# Patient Record
Sex: Male | Born: 1940
Health system: Southern US, Community
[De-identification: ages and names within clinical notes are randomized; demographics above are authoritative.]

## PROBLEM LIST (undated history)

## (undated) DIAGNOSIS — I10 Essential (primary) hypertension: Secondary | ICD-10-CM

## (undated) DIAGNOSIS — D126 Benign neoplasm of colon, unspecified: Secondary | ICD-10-CM

## (undated) DIAGNOSIS — N419 Inflammatory disease of prostate, unspecified: Secondary | ICD-10-CM

## (undated) DIAGNOSIS — M722 Plantar fascial fibromatosis: Secondary | ICD-10-CM

## (undated) DIAGNOSIS — K648 Other hemorrhoids: Secondary | ICD-10-CM

## (undated) DIAGNOSIS — E785 Hyperlipidemia, unspecified: Secondary | ICD-10-CM

## (undated) DIAGNOSIS — I219 Acute myocardial infarction, unspecified: Secondary | ICD-10-CM

## (undated) DIAGNOSIS — S62609A Fracture of unspecified phalanx of unspecified finger, initial encounter for closed fracture: Secondary | ICD-10-CM

## (undated) DIAGNOSIS — Z9889 Other specified postprocedural states: Secondary | ICD-10-CM

## (undated) DIAGNOSIS — S2239XA Fracture of one rib, unspecified side, initial encounter for closed fracture: Secondary | ICD-10-CM

## (undated) HISTORY — PX: POLYPECTOMY: SHX149

## (undated) HISTORY — DX: Other specified postprocedural states: Z98.890

## (undated) HISTORY — DX: Fracture of unspecified phalanx of unspecified finger, initial encounter for closed fracture: S62.609A

## (undated) HISTORY — PX: VARICOCELECTOMY: SHX1084

## (undated) HISTORY — DX: Plantar fascial fibromatosis: M72.2

## (undated) HISTORY — PX: COLONOSCOPY: SHX174

## (undated) HISTORY — DX: Hyperlipidemia, unspecified: E78.5

## (undated) HISTORY — DX: Essential (primary) hypertension: I10

## (undated) HISTORY — DX: Other hemorrhoids: K64.8

## (undated) HISTORY — DX: Acute myocardial infarction, unspecified: I21.9

## (undated) HISTORY — DX: Inflammatory disease of prostate, unspecified: N41.9

## (undated) HISTORY — DX: Fracture of one rib, unspecified side, initial encounter for closed fracture: S22.39XA

## (undated) HISTORY — DX: Benign neoplasm of colon, unspecified: D12.6

## (undated) HISTORY — PX: TONSILLECTOMY: SUR1361

---

## 2004-06-13 ENCOUNTER — Ambulatory Visit: Payer: Self-pay | Admitting: Internal Medicine

## 2004-06-16 ENCOUNTER — Ambulatory Visit: Payer: Self-pay | Admitting: Internal Medicine

## 2004-07-12 ENCOUNTER — Ambulatory Visit: Payer: Self-pay | Admitting: Gastroenterology

## 2004-07-22 ENCOUNTER — Ambulatory Visit: Payer: Self-pay | Admitting: Gastroenterology

## 2005-10-16 ENCOUNTER — Ambulatory Visit: Payer: Self-pay | Admitting: Internal Medicine

## 2005-10-23 ENCOUNTER — Ambulatory Visit: Payer: Self-pay | Admitting: Internal Medicine

## 2007-12-23 ENCOUNTER — Encounter: Payer: Self-pay | Admitting: *Deleted

## 2007-12-23 DIAGNOSIS — Z9089 Acquired absence of other organs: Secondary | ICD-10-CM | POA: Insufficient documentation

## 2007-12-23 DIAGNOSIS — M722 Plantar fascial fibromatosis: Secondary | ICD-10-CM

## 2007-12-23 DIAGNOSIS — K648 Other hemorrhoids: Secondary | ICD-10-CM

## 2007-12-23 DIAGNOSIS — D126 Benign neoplasm of colon, unspecified: Secondary | ICD-10-CM

## 2007-12-23 DIAGNOSIS — Z87448 Personal history of other diseases of urinary system: Secondary | ICD-10-CM

## 2007-12-23 DIAGNOSIS — Z9189 Other specified personal risk factors, not elsewhere classified: Secondary | ICD-10-CM | POA: Insufficient documentation

## 2007-12-23 DIAGNOSIS — S2239XA Fracture of one rib, unspecified side, initial encounter for closed fracture: Secondary | ICD-10-CM | POA: Insufficient documentation

## 2007-12-23 DIAGNOSIS — S62609A Fracture of unspecified phalanx of unspecified finger, initial encounter for closed fracture: Secondary | ICD-10-CM | POA: Insufficient documentation

## 2008-04-17 ENCOUNTER — Telehealth: Payer: Self-pay | Admitting: Internal Medicine

## 2008-04-20 ENCOUNTER — Ambulatory Visit: Payer: Self-pay | Admitting: Internal Medicine

## 2008-04-20 DIAGNOSIS — E782 Mixed hyperlipidemia: Secondary | ICD-10-CM | POA: Insufficient documentation

## 2008-04-20 DIAGNOSIS — Z85828 Personal history of other malignant neoplasm of skin: Secondary | ICD-10-CM | POA: Insufficient documentation

## 2008-04-20 DIAGNOSIS — R109 Unspecified abdominal pain: Secondary | ICD-10-CM

## 2008-04-20 LAB — CONVERTED CEMR LAB
AST: 18 units/L (ref 0–37)
Alkaline Phosphatase: 49 units/L (ref 39–117)
Basophils Absolute: 0.1 10*3/uL (ref 0.0–0.1)
Basophils Relative: 0.8 % (ref 0.0–3.0)
Bilirubin, Direct: 0.1 mg/dL (ref 0.0–0.3)
Cholesterol: 183 mg/dL (ref 0–200)
Eosinophils Absolute: 0.1 10*3/uL (ref 0.0–0.7)
HDL: 43.5 mg/dL (ref >39.0)
LDL Cholesterol: 123 mg/dL — ABNORMAL HIGH (ref 0–99)
Lymphocytes Relative: 32.4 % (ref 12.0–46.0)
MCHC: 35.3 g/dL (ref 30.0–36.0)
MCV: 90.7 fL (ref 78.0–100.0)
Neutrophils Relative %: 54 % (ref 43.0–77.0)
Platelets: 252 10*3/uL (ref 150–400)
RBC: 4.69 M/uL (ref 4.22–5.81)
Total Bilirubin: 1.2 mg/dL (ref 0.3–1.2)
VLDL: 17 mg/dL (ref 0–40)

## 2008-07-17 ENCOUNTER — Encounter: Payer: Self-pay | Admitting: Internal Medicine

## 2008-07-27 ENCOUNTER — Encounter: Payer: Self-pay | Admitting: Internal Medicine

## 2008-08-17 ENCOUNTER — Telehealth: Payer: Self-pay | Admitting: Internal Medicine

## 2008-10-16 ENCOUNTER — Telehealth: Payer: Self-pay | Admitting: Internal Medicine

## 2008-10-20 ENCOUNTER — Ambulatory Visit: Payer: Self-pay | Admitting: Internal Medicine

## 2008-10-20 DIAGNOSIS — G609 Hereditary and idiopathic neuropathy, unspecified: Secondary | ICD-10-CM | POA: Insufficient documentation

## 2008-11-23 ENCOUNTER — Telehealth: Payer: Self-pay | Admitting: Internal Medicine

## 2008-11-24 ENCOUNTER — Ambulatory Visit: Payer: Self-pay | Admitting: Internal Medicine

## 2008-11-24 DIAGNOSIS — R609 Edema, unspecified: Secondary | ICD-10-CM

## 2008-11-24 DIAGNOSIS — M79609 Pain in unspecified limb: Secondary | ICD-10-CM

## 2008-11-24 IMAGING — CR DG HAND COMPLETE 3+V*R*
3 series · 3 of 3 positions shown · non-contrast
Comparison: None available.

CLINICAL DATA: Right hand pain.  Swelling about the third MCP
joint.

RIGHT HAND - COMPLETE 3+ VIEW

[view not recorded (1 of 3)]
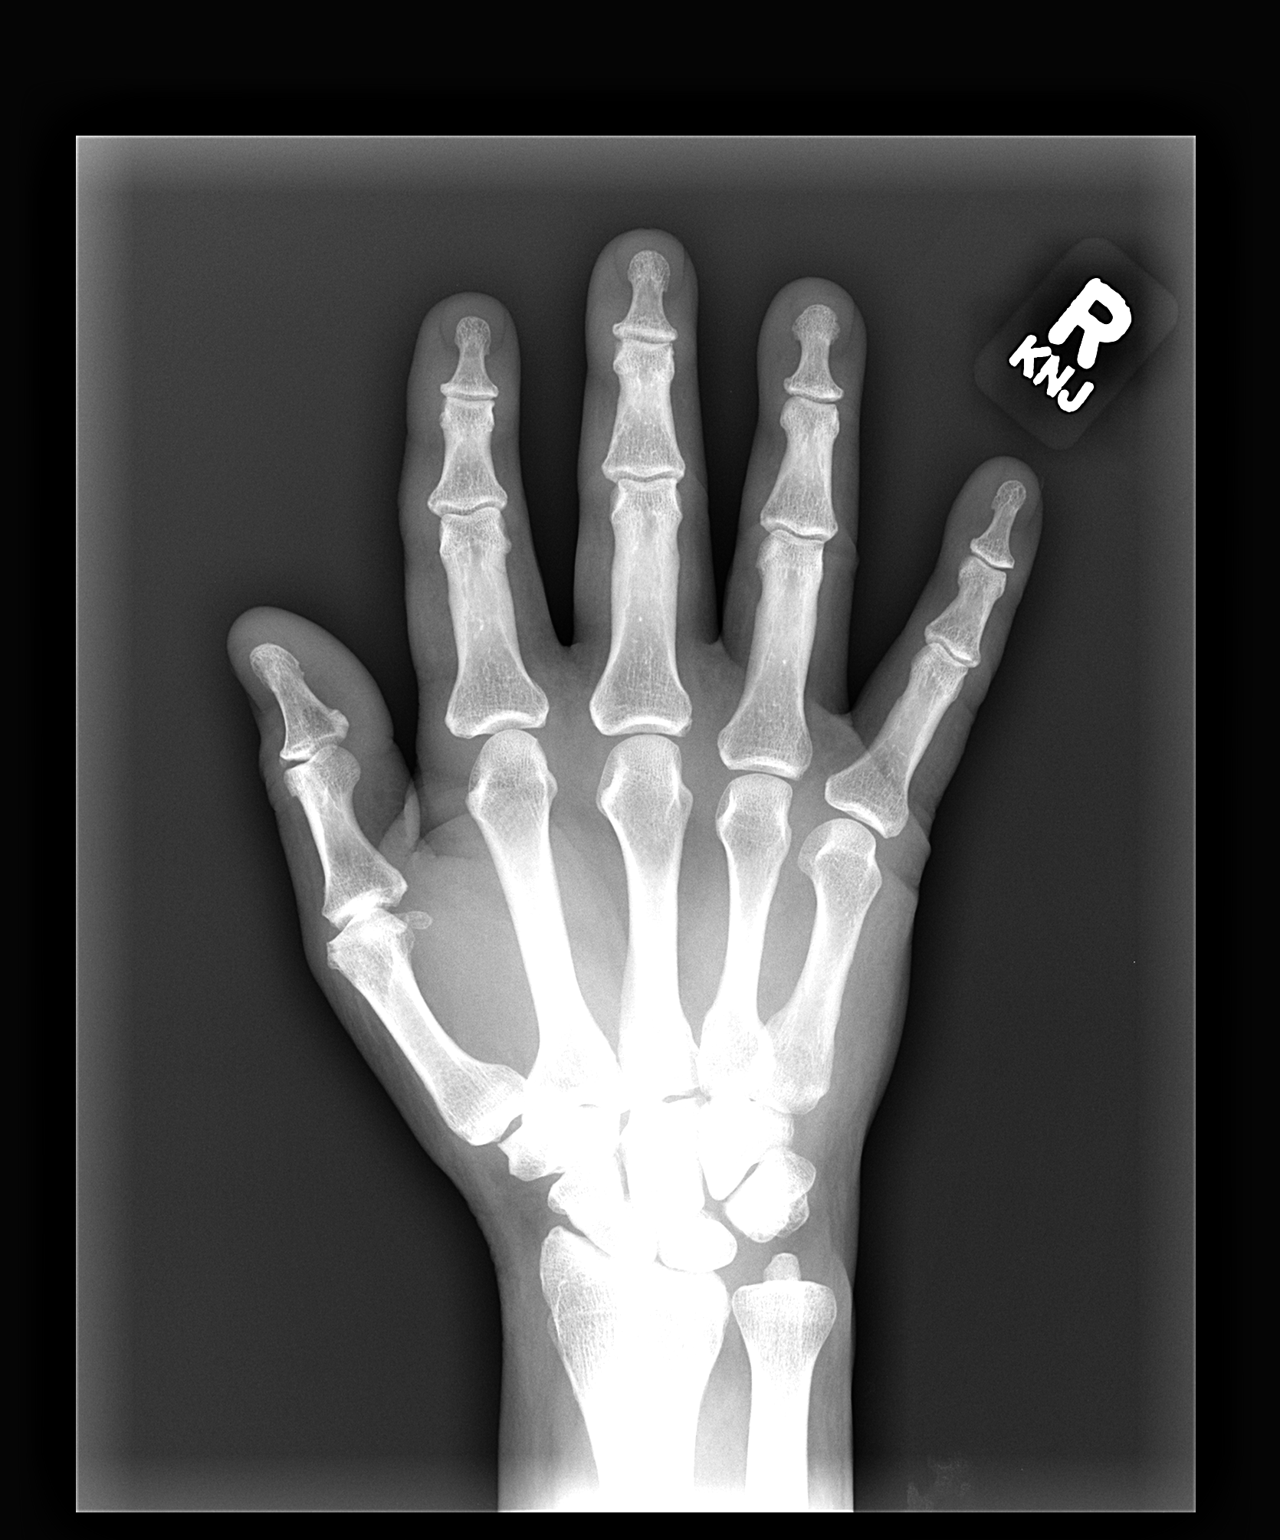

[view not recorded (2 of 3)]
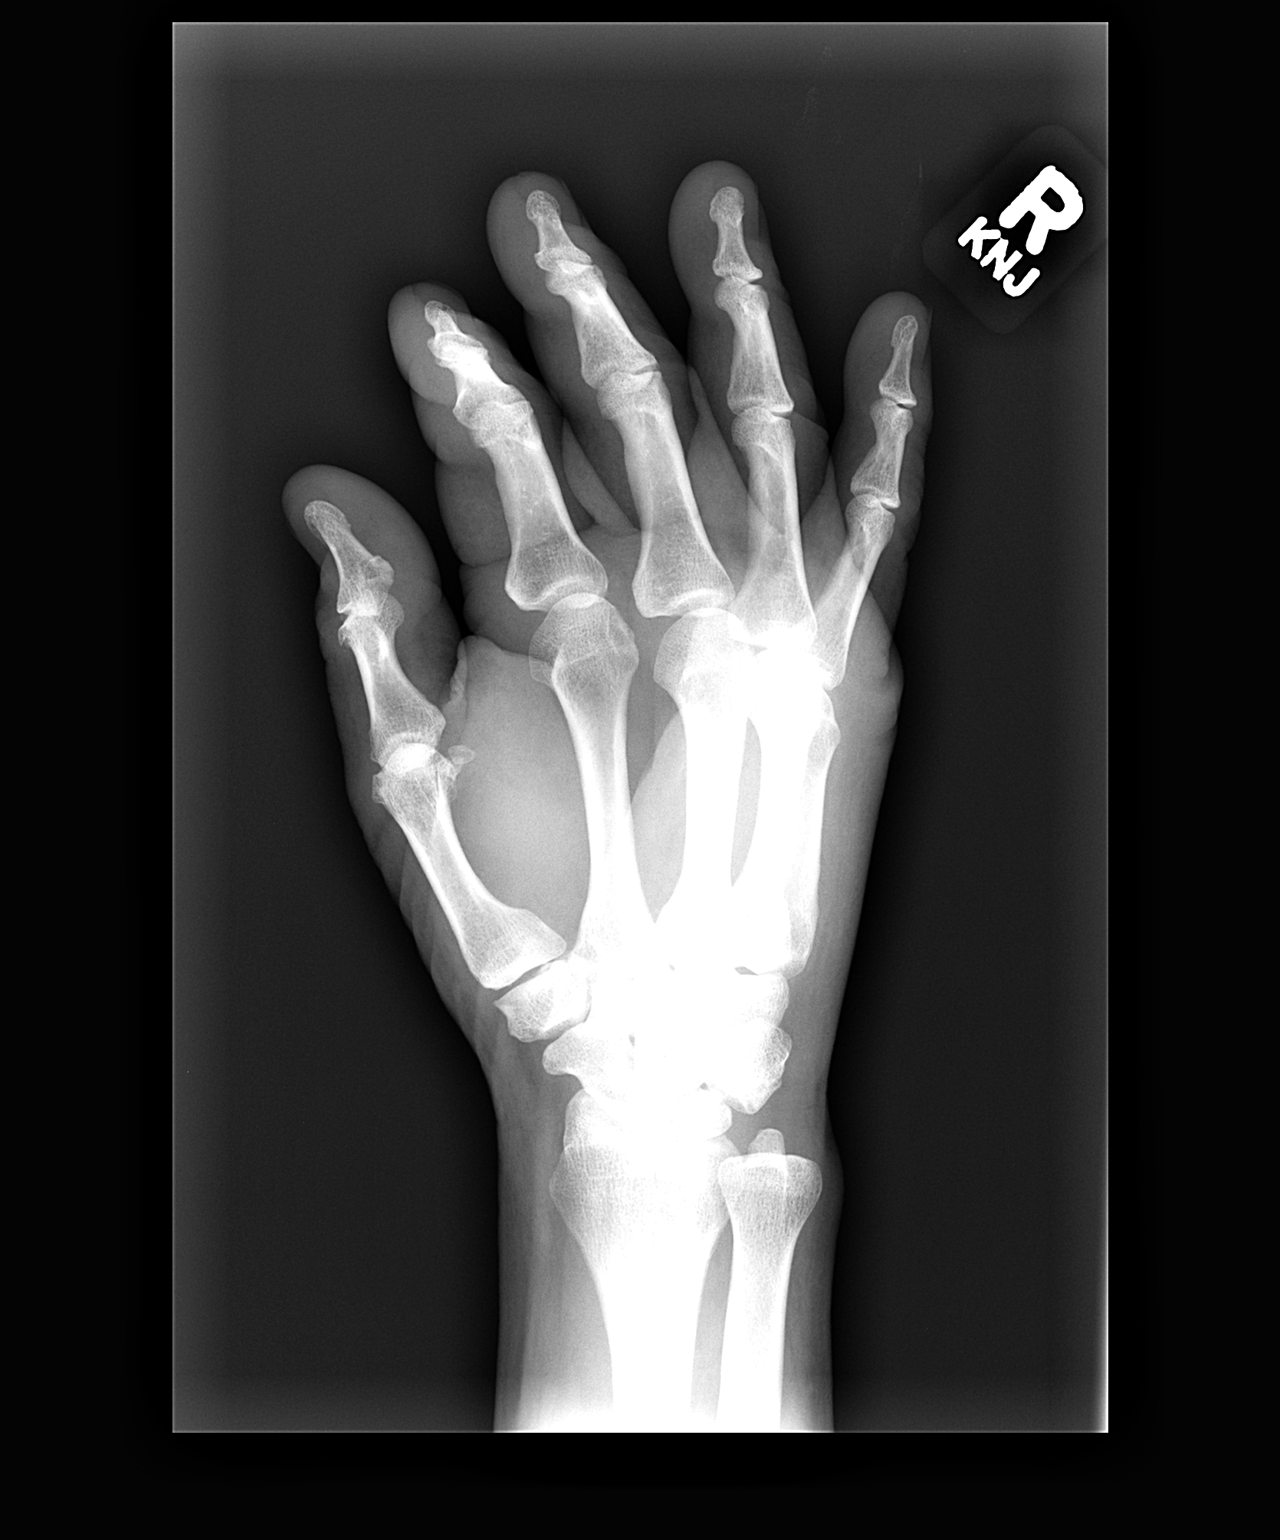

[view not recorded (3 of 3)]
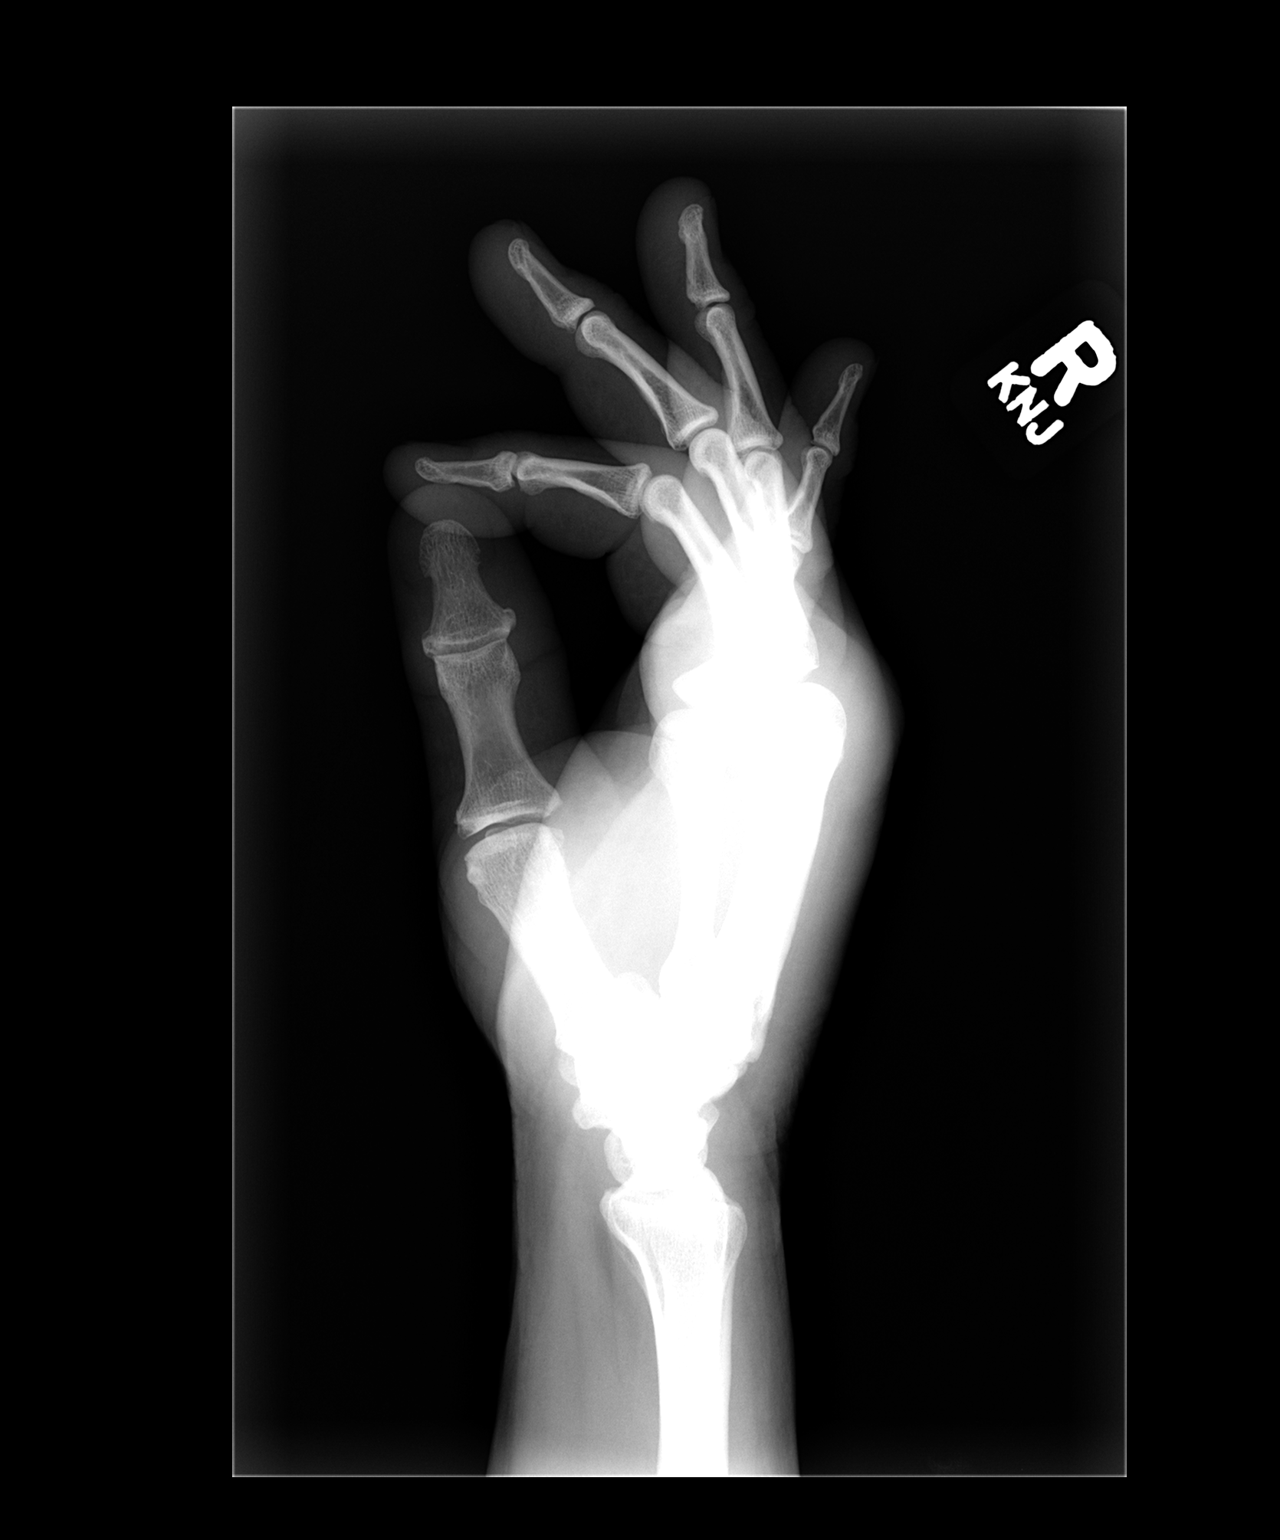

[3 of 3 positions shown; findings below may reference images not displayed]

FINDINGS: There is no fracture or dislocation.  Joint spaces and
alignment are maintained.  No obvious soft tissue swelling.
IMPRESSION: Negative exam.

## 2008-11-25 LAB — CONVERTED CEMR LAB
BUN: 15 mg/dL (ref 6–23)
CO2: 26 meq/L (ref 19–32)
Chloride: 108 meq/L (ref 96–112)
Creatinine, Ser: 0.8 mg/dL (ref 0.4–1.5)
Eosinophils Absolute: 0.1 10*3/uL (ref 0.0–0.7)
Eosinophils Relative: 1.6 % (ref 0.0–5.0)
Glucose, Bld: 102 mg/dL — ABNORMAL HIGH (ref 70–99)
HCT: 43.8 % (ref 39.0–52.0)
Lymphs Abs: 2.1 10*3/uL (ref 0.7–4.0)
MCHC: 34.4 g/dL (ref 30.0–36.0)
MCV: 92.7 fL (ref 78.0–100.0)
Monocytes Absolute: 0.7 10*3/uL (ref 0.1–1.0)
Neutrophils Relative %: 59.3 % (ref 43.0–77.0)
Platelets: 275 10*3/uL (ref 150.0–400.0)
Potassium: 4.1 meq/L (ref 3.5–5.1)
Uric Acid, Serum: 7 mg/dL (ref 4.0–7.8)
WBC: 7.3 10*3/uL (ref 4.5–10.5)

## 2008-12-04 ENCOUNTER — Telehealth (INDEPENDENT_AMBULATORY_CARE_PROVIDER_SITE_OTHER): Payer: Self-pay | Admitting: *Deleted

## 2008-12-07 ENCOUNTER — Ambulatory Visit: Payer: Self-pay | Admitting: Internal Medicine

## 2008-12-10 ENCOUNTER — Telehealth: Payer: Self-pay | Admitting: Internal Medicine

## 2008-12-15 ENCOUNTER — Encounter: Payer: Self-pay | Admitting: Internal Medicine

## 2009-06-11 ENCOUNTER — Encounter (INDEPENDENT_AMBULATORY_CARE_PROVIDER_SITE_OTHER): Payer: Self-pay | Admitting: *Deleted

## 2009-07-13 DIAGNOSIS — C4492 Squamous cell carcinoma of skin, unspecified: Secondary | ICD-10-CM

## 2009-07-13 HISTORY — DX: Squamous cell carcinoma of skin, unspecified: C44.92

## 2009-08-25 ENCOUNTER — Encounter (INDEPENDENT_AMBULATORY_CARE_PROVIDER_SITE_OTHER): Payer: Self-pay | Admitting: *Deleted

## 2009-09-10 ENCOUNTER — Encounter (INDEPENDENT_AMBULATORY_CARE_PROVIDER_SITE_OTHER): Payer: Self-pay | Admitting: *Deleted

## 2009-09-13 ENCOUNTER — Ambulatory Visit: Payer: Self-pay | Admitting: Gastroenterology

## 2009-09-27 ENCOUNTER — Ambulatory Visit: Payer: Self-pay | Admitting: Gastroenterology

## 2009-09-27 LAB — HM COLONOSCOPY

## 2009-09-29 ENCOUNTER — Encounter: Payer: Self-pay | Admitting: Gastroenterology

## 2009-09-30 ENCOUNTER — Ambulatory Visit: Payer: Self-pay | Admitting: Internal Medicine

## 2009-09-30 LAB — CONVERTED CEMR LAB
AST: 20 units/L (ref 0–37)
Alkaline Phosphatase: 59 units/L (ref 39–117)
BUN: 14 mg/dL (ref 6–23)
Basophils Absolute: 0.1 10*3/uL (ref 0.0–0.1)
Bilirubin, Direct: 0.2 mg/dL (ref 0.0–0.3)
CO2: 29 meq/L (ref 19–32)
Calcium: 9.1 mg/dL (ref 8.4–10.5)
Chloride: 104 meq/L (ref 96–112)
Cholesterol: 214 mg/dL — ABNORMAL HIGH (ref 0–200)
Creatinine, Ser: 0.9 mg/dL (ref 0.4–1.5)
Direct LDL: 139.4 mg/dL
Eosinophils Absolute: 0.1 10*3/uL (ref 0.0–0.7)
HCT: 44.1 % (ref 39.0–52.0)
Lymphs Abs: 2.3 10*3/uL (ref 0.7–4.0)
MCV: 94 fL (ref 78.0–100.0)
Monocytes Absolute: 0.6 10*3/uL (ref 0.1–1.0)
Neutrophils Relative %: 47.4 % (ref 43.0–77.0)
Platelets: 240 10*3/uL (ref 150.0–400.0)
RDW: 12.3 % (ref 11.5–14.6)
Total CHOL/HDL Ratio: 4
Total Protein: 7.5 g/dL (ref 6.0–8.3)
VLDL: 18 mg/dL (ref 0.0–40.0)

## 2010-09-06 NOTE — Letter (Signed)
Summary: Patient Notice- Polyp Results  [facility]  [address] [place], [place] [address]   Phone: [phone]  Fax: [phone]        September 29, 2009 MRN: [id number]    Billy [name] [address] [place], [place]  [address]    Dear Billy [name],  I am pleased to inform you that the colon polyp(s) removed during your recent colonoscopy was (were) found to be benign (no cancer detected) upon pathologic examination.  I recommend you have a repeat colonoscopy examination in 5 years to look for recurrent polyps, as having colon polyps increases your risk for having recurrent polyps or even colon cancer in the future.  Should you develop new or worsening symptoms of abdominal pain, bowel habit changes or bleeding from the rectum or bowels, please schedule an evaluation with either your primary care physician or with me.  Continue treatment plan as outlined the day of your exam.  Please call us if you are having persistent problems or have questions about your condition that have not been fully answered at this time.  Sincerely,  Meryl Dare MD [facility]  This letter has been electronically signed by your physician.  Appended Document: Patient Notice- Polyp Results  Letter mailed 2.24.11

## 2010-09-06 NOTE — Letter (Signed)
Summary: Previsit letter  [facility]  [address] [place], [place] [address]   Phone: [phone]  Fax: [phone]       08/25/2009 MRN: [id number]  Billy [name] [address] [place], [place]  [address]  Dear Billy [name],  Welcome to the Gastroenterology Division at [facility].    You are scheduled to see a nurse for your pre-procedure visit on 09/13/2009 at 10:30am on the 3rd floor at [facility], [address]  We ask that you try to arrive at our office 15 minutes prior to your appointment time to allow for check-in.  Your nurse visit will consist of discussing your medical and surgical history, your immediate family medical history, and your medications.    Please bring a complete list of all your medications or, if you prefer, bring the medication bottles and we will list them.  We will need to be aware of both prescribed and over the counter drugs.  We will need to know exact dosage information as well.  If you are on blood thinners (Coumadin, Plavix, Aggrenox, Ticlid, etc.) please call our office today/prior to your appointment, as we need to consult with your physician about holding your medication.   Please be prepared to read and sign documents such as consent forms, a financial agreement, and acknowledgement forms.  If necessary, and with your consent, a friend or relative is welcome to sit-in on the nurse visit with you.  Please bring your insurance card so that we may make a copy of it.  If your insurance requires a referral to see a specialist, please bring your referral form from your primary care physician.  No co-pay is required for this nurse visit.     If you cannot keep your appointment, please call [phone] to cancel or reschedule prior to your appointment date.  This allows [place] the opportunity to schedule an appointment for another patient in need of care.    Thank you for choosing [facility] for your medical  needs.  We appreciate the opportunity to care for you.  Please visit [place] at our website  to learn more about our practice.                     Sincerely.                                                                                                                   The Gastroenterology Division

## 2010-09-06 NOTE — Assessment & Plan Note (Signed)
Summary: YEARLY--STC   Vital Signs:  Patient profile:   70 year old male Height:      73 inches Weight:      199 pounds BMI:     26.35 O2 Sat:      97 % on Room air Temp:     97.6 degrees F oral Pulse rate:   60 / minute BP sitting:   136 / 86  (left arm) Cuff size:   large  Vitals Entered By: Brenton Grills (September 30, 2009 10:13 AM)  O2 Flow:  Room air CC: Pt here for physical./aj   Primary Care Provider:  Norins  CC:  Pt here for physical./aj.  History of Present Illness: Patient presents for a routine medical exam. He had colonsocopy 2/21 - tubular adenoma. He monitors BP  at home- generally 120's/80's. He does report a sense of fullness or tightness of his pants at times. He reports that his swollen left hand resolved without incident. He has a sore shoulder from taking a fall skiing.     Current Medications (verified): 1)  Multivitamins   Tabs (Multiple Vitamin) .... Take Once Daily 2)  Fish Oil   Oil (Fish Oil) .... Take 2 Tabs Once Daily 3)  Red Yeast Rice 600 Mg  Caps (Red Yeast Rice Extract) .... Take Two Tablets Daily 4)  Hydrocodone-Acetaminophen 5-325 Mg Tabs (Hydrocodone-Acetaminophen) .Marland Kitchen.. 1 By Mouth Up To 4 Times Per Day As Needed For Pain 5)  Colchicine 0.6 Mg Tabs (Colchicine) .... 2 Tablets Once A Day 6)  Moviprep 100 Gm  Solr (Peg-Kcl-Nacl-Nasulf-Na Asc-C) .... As Per Prep Instructions.  Allergies (verified): 1)  ! 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: Married - '69 1 son - '78, 1 daughter '74  2 grandchildren reitired - stays busy, does some real-estate speculation  Review of Systems  The patient denies anorexia, fever, weight loss, weight gain, vision loss, decreased hearing, hoarseness, chest pain, syncope, dyspnea on exertion, peripheral edema, prolonged cough, headaches, hemoptysis, abdominal pain, melena, hematochezia, severe indigestion/heartburn, hematuria, incontinence, genital sores, muscle weakness, suspicious skin lesions, transient blindness, difficulty walking, depression, unusual weight change, enlarged lymph nodes, angioedema, and testicular masses.    Physical Exam  General:  WNWD white male who looks younger than his stated age Head:  Normocephalic and atraumatic without obvious abnormalities. No apparent alopecia or balding. Eyes:  No corneal or conjunctival inflammation noted. EOMI. Perrla. Funduscopic exam benign, without hemorrhages, exudates or papilledema. Vision grossly normal. Ears:  External ear exam shows no significant lesions or deformities.  Otoscopic examination reveals clear canals, tympanic membranes are intact bilaterally without bulging, retraction, inflammation or discharge. Hearing is grossly normal bilaterally. Nose:  no external deformity and no external erythema.   Mouth:  Oral mucosa and oropharynx without lesions or exudates.  Teeth in good repair. Neck:  No deformities, masses, or tenderness noted. Chest Wall:  No deformities, masses, tenderness or  gynecomastia noted. Lungs:  Normal respiratory effort, chest expands symmetrically. Lungs are clear to auscultation, no crackles or wheezes. Heart:  Normal rate and regular rhythm. S1 and S2 normal without gallop, murmur, click, rub or other extra sounds. Abdomen:  soft, non-tender, normal  bowel sounds, no guarding, no rigidity, and no hepatomegaly.   Rectal:  No external abnormalities noted. Normal sphincter tone. No rectal masses or tenderness. Prostate:  Prostate gland firm and smooth, no enlargement, nodularity, tenderness, mass, asymmetry or induration. Msk:  normal ROM, no joint tenderness, no joint swelling, no joint warmth, no joint deformities, and no joint instability.   Pulses:  2+ radial and DP pulses Extremities:  No clubbing, cyanosis, edema, or deformity noted with normal full range of motion of all joints.   Neurologic:  No cranial nerve deficits noted. Station and gait are normal. Plantar reflexes are down-going bilaterally. DTRs are symmetrical throughout. Sensory, motor and coordinative functions appear intact. Skin:  turgor normal, color normal, no rashes, no ulcerations, and no edema.   Cervical Nodes:  no anterior cervical adenopathy and no posterior cervical adenopathy.   Inguinal Nodes:  no R inguinal adenopathy and no L inguinal adenopathy.   Psych:  Oriented X3, memory intact for recent and remote, normally interactive, good eye contact, and not anxious appearing.     Impression & Recommendations:  Problem # 1:  HYPERLIPIDEMIA (ICD-272.4) Takes red yeast rice. Due for lab follow-up  Orders: TLB-Lipid Panel (80061-LIPID) TLB-Hepatic/Liver Function Pnl (80076-HEPATIC)  Addendum: excellent HDL, LDL @ 139.4 just above goal of 130 or less; LDL/HDL ratio 2.36 is lower than average risk. 10 year risk of cardiac event = 12% (see attachments)  Plan - continue present regimen  Problem # 2:  ELEVATED BLOOD PRESSURE WITHOUT DIAGNOSIS OF HYPERTENSION (ICD-796.2) Patient had BP of 160/100 both arms on my exam. He reports no prior h/o high blood pressure and he has readings at home that are never higher than 130/80s.  Plan - patient to check BP at random times and report back           will initiate treatment if he has high readings at home.  Problem # 3:   POLYPECTOMY, HX OF (ICD-V15.9) Just had polypectomy - tubular adenoma. He is for recall in 5 years.  Will check CBC  Orders: TLB-CBC Platelet - w/Differential (85025-CBCD)  Problem # 4:  Preventive Health Care (ICD-V70.0) Unremarkable history except for skiing accident leaving him with sore trapezius right. Normal exam. Lab results are good. Current with colorectal cancer screening. PSA is normal. Last tetnus in 2000. He is due for tetnus booster, pneumonia vaccine, shingles vaccine.  In summary - a very pleasant gentleman who appears healthy and medically stable with the exception of elevated BP at today's visit. BP will be monitored.  Complete Medication List: 1)  Multivitamins Tabs (Multiple vitamin) .... Take once daily 2)  Fish Oil Oil (Fish oil) .... Take 2 tabs once daily 3)  Red Yeast Rice 600 Mg Caps (Red yeast rice extract) .... Take two tablets daily 4)  Hydrocodone-acetaminophen 5-325 Mg Tabs (Hydrocodone-acetaminophen) .Marland Kitchen.. 1 by mouth up to 4 times per day as needed for pain 5)  Colchicine 0.6 Mg Tabs (Colchicine) .... 2 tablets once a day  Other Orders: TLB-BMP (Basic Metabolic Panel-BMET) (80048-METABOL) TLB-PSA (Prostate Specific Antigen) (84153-PSA)   Patient: Billy [name] Note: All result statuses are Final unless otherwise noted.  Tests: (1) Lipid Panel (LIPID)   Cholesterol          [  H]  214 mg/dL                   1-610     ATP III Classification            Desirable:  < 200 mg/dL                    Borderline High:  200 - 239 mg/dL               High:  > = 240 mg/dL   Triglycerides             90.0 mg/dL                  9.6-045.4     Normal:  <150 mg/dL     Borderline High:  098 - 199 mg/dL   HDL                       11.91 mg/dL                 >47.82   VLDL Cholesterol          18.0 mg/dL                  9.5-62.1  CHO/HDL Ratio:  CHD Risk                             4                    Men          Women     1/2 Average Risk     3.4          3.3      Average Risk          5.0          4.4     2X Average Risk          9.6          7.1     3X Average Risk          15.0          11.0                           Tests: (2) Hepatic/Liver Function Panel (HEPATIC)   Total Bilirubin           0.7 mg/dL                   3.0-8.6   Direct Bilirubin          0.2 mg/dL                   5.7-8.4   Alkaline Phosphatase      59 U/L                      39-117   AST                       20 U/L                      0-37   ALT                       26  U/L                      0-53   Total Protein             7.5 g/dL                    9.5-2.8   Albumin                   4.0 g/dL                    4.1-3.2  Tests: (3) BMP (METABOL)   Sodium                    140 mEq/L                   135-145   Potassium                 4.2 mEq/L                   3.5-5.1   Chloride                  104 mEq/L                   96-112   Carbon Dioxide            29 mEq/L                    19-32   Glucose              [H]  102 mg/dL                   44-01   BUN                       14 mg/dL                    0-27   Creatinine                0.9 mg/dL                   2.5-3.6   Calcium                   9.1 mg/dL                   6.4-40.3   GFR                       89.05 mL/min                >60  Tests: (4) Prostate Specific Antigen (PSA)   PSA-Hyb                   1.40 ng/mL                  0.10-4.00  Tests: (5) CBC Platelet w/Diff (CBCD)   White Cell Count          6.0 K/uL                    4.5-10.5   Red Cell Count            4.69 Mil/uL  4.22-5.81   Hemoglobin                14.6 g/dL                   16.1-09.6   Hematocrit                44.1 %                      39.0-52.0   MCV                       94.0 fl                     78.0-100.0   MCHC                      33.2 g/dL                   04.5-40.9   RDW                       12.3 %                      11.5-14.6   Platelet Count            240.0 K/uL                   150.0-400.0   Neutrophil %              47.4 %                      43.0-77.0   Lymphocyte %              38.5 %                      12.0-46.0   Monocyte %                10.4 %                      3.0-12.0   Eosinophils%              2.4 %                       0.0-5.0   Basophils %               1.3 %                       0.0-3.0   Neutrophill Absolute      2.9 K/uL                    1.4-7.7   Lymphocyte Absolute       2.3 K/uL                    0.7-4.0   Monocyte Absolute         0.6 K/uL                    0.1-1.0  Eosinophils, Absolute                             0.1 K/uL  0.0-0.7   Basophils Absolute        0.1 K/uL                    0.0-0.1  Tests: (6) Cholesterol LDL - Direct (DIRLDL)  Cholesterol LDL - Direct                             139.4 mg/dL     Optimal:  <295 mg/dL     Near or Above Optimal:  100-129 mg/dL     Borderline High:  621-308 mg/dL     High:  657-846 mg/dL     Very High:  >962 mg/dL

## 2010-09-06 NOTE — Miscellaneous (Signed)
Summary: [facility]  Clinical Lists Changes  Medications: Added new medication of MOVIPREP 100 GM  SOLR (PEG-KCL-NACL-NASULF-NA ASC-C) As per prep instructions. - Signed Rx of MOVIPREP 100 GM  SOLR (PEG-KCL-NACL-NASULF-NA ASC-C) As per prep instructions.;  #1 x 0;  Signed;  Entered by: Ezra Sites RN;  Authorized by: Meryl Dare MD [facility];  Method used: Electronically to [facility]. #[id number]*, [address] [place], [place], [place]  [address], Ph: [phone], Fax: [phone] Allergies: Changed allergy or adverse reaction from [facility] to [facility]    Prescriptions: MOVIPREP 100 GM  SOLR (PEG-KCL-NACL-NASULF-NA ASC-C) As per prep instructions.  #1 x 0   Entered by:   Ezra Sites RN   Authorized by:   Meryl Dare MD [facility]   Signed by:   Ezra Sites RN on 09/13/2009   Method used:   Electronically to        [facility] [phone]* (retail)       [address]       [place], [place]  [address]       Ph: [phone]       Fax: [phone]   RxID:   [id number]

## 2010-09-06 NOTE — Procedures (Signed)
Summary: Colonoscopy  Patient: [name] Note: All result statuses are Final unless otherwise noted.  Tests: (1) Colonoscopy (COL)   COL Colonoscopy           DONE     [facility]     [address]     [place], [place]  [address]           COLONOSCOPY PROCEDURE REPORT           PATIENT:  Billy, [name]  MR#:  [id number]     BIRTHDATE:  [birth year], 68 yrs. old  GENDER:  male           ENDOSCOPIST:  Judie Petit T. Russella Dar, MD, [facility]           PROCEDURE DATE:  09/27/2009     PROCEDURE:  Colonoscopy with biopsy     ASA CLASS:  Class II     INDICATIONS:  1) follow-up of polyp, adenomatous polyp, 07/2004.           MEDICATIONS:   Fentanyl 100 mcg IV, Versed 10 mg IV           DESCRIPTION OF PROCEDURE:   After the risks benefits and     alternatives of the procedure were thoroughly explained, informed     consent was obtained.  Digital rectal exam was performed and     revealed no abnormalities.   The LB [id number] endoscope     was introduced through the anus and advanced to the cecum, which     was identified by both the appendix and ileocecal valve, without     limitations.  The quality of the prep was excellent, using     MoviPrep.  The instrument was then slowly withdrawn as the colon     was fully examined.     <<PROCEDUREIMAGES>>           FINDINGS:  A sessile polyp was found in the mid transverse colon.     It was 3 mm in size. The polyp was removed using cold biopsy     forceps.  A normal appearing cecum, ileocecal valve, and     appendiceal orifice were identified. The ascending, hepatic     flexure, splenic flexure, descending, sigmoid colon, and rectum     appeared unremarkable. Retroflexed views in the rectum revealed     internal hemorrhoids. small.  The time to cecum =  2.75  minutes.     The scope was then withdrawn (time =  9.33  min) from the patient     and the procedure completed.           COMPLICATIONS:  None           ENDOSCOPIC  IMPRESSION:     1) 3 mm sessile polyp in the mid transverse colon     2) Internal hemorrhoids           RECOMMENDATIONS:     1) Await pathology results     2) Repeat Colonoscopy in 5 years.           Venita Lick. Russella Dar, MD, [facility]           CC: Jacques Navy, MD           n.     Rosalie DoctorVenita Lick. Stark at 09/27/2009 12:00 PM           Ned, Kakar [phone]  Note: An exclamation mark (!) indicates a result that  was not dispersed into the flowsheet. Document Creation Date: 09/27/2009 12:02 PM _______________________________________________________________________  (1) Order result status: Final Collection or observation date-time: 09/27/2009 11:57 Requested date-time:  Receipt date-time:  Reported date-time:  Referring Physician:   Ordering Physician: Claudette Head [phone]) Specimen Source:  Source: Launa Grill Order Number: [id number] Lab site:   Appended Document: Colonoscopy 5 yr recall/2016     Procedures Next Due Date:    Colonoscopy: 09/2014

## 2010-09-06 NOTE — Letter (Signed)
Summary: [facility] Instructions  [facility]  [address] [place], [place] [address]   Phone: [phone]  Fax: [phone]       Billy [name]    23-Jun-1941    MRN: [id number]        Procedure Day /Date: Monday 09/27/09     Arrival Time: 10:30 AM      Procedure Time: 11:30 AM     Location of Procedure:                    _X_  [facility] (4th Floor)  PREPARATION FOR COLONOSCOPY WITH MOVIPREP   Starting 5 days prior to your procedure Wednesday 09/22/09  do not eat nuts, seeds, popcorn, corn, beans, peas,  salads, or any raw vegetables.  Do not take any fiber supplements (e.g. Metamucil, Citrucel, and Benefiber).  THE DAY BEFORE YOUR PROCEDURE         DATE:  09/26/09    DAY:  Sunday    1.  Drink clear liquids the entire day-NO SOLID FOOD  2.  Do not drink anything colored red or purple.  Avoid juices with pulp.  No orange juice.  3.  Drink at least 64 oz. (8 glasses) of fluid/clear liquids during the day to prevent dehydration and help the prep work efficiently.  CLEAR LIQUIDS INCLUDE: Water Jello Ice Popsicles Tea (sugar ok, no milk/cream) Powdered fruit flavored drinks Coffee (sugar ok, no milk/cream) Gatorade Juice: apple, white grape, white cranberry  Lemonade Clear bullion, consomm, broth Carbonated beverages (any kind) Strained chicken noodle soup Hard Candy                             4.  In the morning, mix first dose of MoviPrep solution:    Empty 1 Pouch A and 1 Pouch B into the disposable container    Add lukewarm drinking water to the top line of the container. Mix to dissolve    Refrigerate (mixed solution should be used within 24 hrs)  5.  Begin drinking the prep at 5:00 p.m. The MoviPrep container is divided by 4 marks.   Every 15 minutes drink the solution down to the next mark (approximately 8 oz) until the full liter is complete.   6.  Follow completed prep with 16 oz of clear liquid of your choice (Nothing red or  purple).  Continue to drink clear liquids until bedtime.  7.  Before going to bed, mix second dose of MoviPrep solution:    Empty 1 Pouch A and 1 Pouch B into the disposable container    Add lukewarm drinking water to the top line of the container. Mix to dissolve    Refrigerate  THE DAY OF YOUR PROCEDURE      DATE:  09/27/09   DAY:  Monday   Beginning at 6:30 a.m. (5 hours before procedure):        1. Every 15 minutes, drink the solution down to the next mark (approx 8 oz) until the full liter is complete.  2. Follow completed prep with 16 oz. of clear liquid of your choice.    3. You may drink clear liquids until 9:30 AM   (2 HOURS BEFORE PROCEDURE).   MEDICATION INSTRUCTIONS  Unless otherwise instructed, you should take regular prescription medications with a small sip of water   as early as possible the morning of your procedure.  OTHER INSTRUCTIONS  You will need a responsible adult at least 70 years of age to accompany you and drive you home.   This person must remain in the waiting room during your procedure.  Wear loose fitting clothing that is easily removed.  Leave jewelry and other valuables at home.  However, you may wish to bring a book to read or  an iPod/MP3 player to listen to music as you wait for your procedure to start.  Remove all body piercing jewelry and leave at home.  Total time from sign-in until discharge is approximately 2-3 hours.  You should go home directly after your procedure and rest.  You can resume normal activities the  day after your procedure.  The day of your procedure you should not:   Drive   Make legal decisions   Operate machinery   Drink alcohol   Return to work  You will receive specific instructions about eating, activities and medications before you leave.    The above instructions have been reviewed and explained to me by   Ezra Sites RN  September 13, 2009 11:14 AM     I fully understand and  can verbalize these instructions _____________________________ Date _________

## 2010-11-18 ENCOUNTER — Encounter: Payer: Self-pay | Admitting: Internal Medicine

## 2010-11-21 ENCOUNTER — Other Ambulatory Visit (INDEPENDENT_AMBULATORY_CARE_PROVIDER_SITE_OTHER): Payer: Medicare Other | Admitting: Internal Medicine

## 2010-11-21 ENCOUNTER — Ambulatory Visit (INDEPENDENT_AMBULATORY_CARE_PROVIDER_SITE_OTHER): Payer: Medicare Other | Admitting: Internal Medicine

## 2010-11-21 ENCOUNTER — Encounter: Payer: Self-pay | Admitting: Internal Medicine

## 2010-11-21 ENCOUNTER — Other Ambulatory Visit (INDEPENDENT_AMBULATORY_CARE_PROVIDER_SITE_OTHER): Payer: Medicare Other

## 2010-11-21 VITALS — BP 122/88 | HR 59 | Temp 97.9°F | Wt 193.0 lb

## 2010-11-21 DIAGNOSIS — Z23 Encounter for immunization: Secondary | ICD-10-CM

## 2010-11-21 DIAGNOSIS — R9431 Abnormal electrocardiogram [ECG] [EKG]: Secondary | ICD-10-CM

## 2010-11-21 DIAGNOSIS — E785 Hyperlipidemia, unspecified: Secondary | ICD-10-CM

## 2010-11-21 DIAGNOSIS — N400 Enlarged prostate without lower urinary tract symptoms: Secondary | ICD-10-CM

## 2010-11-21 DIAGNOSIS — Z136 Encounter for screening for cardiovascular disorders: Secondary | ICD-10-CM

## 2010-11-21 DIAGNOSIS — Z125 Encounter for screening for malignant neoplasm of prostate: Secondary | ICD-10-CM

## 2010-11-21 LAB — CBC WITH DIFFERENTIAL/PLATELET
Basophils Relative: 0.5 % (ref 0.0–3.0)
Eosinophils Relative: 1.9 % (ref 0.0–5.0)
Hemoglobin: 14.8 g/dL (ref 13.0–17.0)
Lymphocytes Relative: 30.9 % (ref 12.0–46.0)
MCHC: 33.8 g/dL (ref 30.0–36.0)
Monocytes Relative: 10.2 % (ref 3.0–12.0)
Neutro Abs: 4.3 10*3/uL (ref 1.4–7.7)
RBC: 4.69 Mil/uL (ref 4.22–5.81)
WBC: 7.6 10*3/uL (ref 4.5–10.5)

## 2010-11-21 LAB — LIPID PANEL
HDL: 55.1 mg/dL (ref >39.00)
Total CHOL/HDL Ratio: 4
Triglycerides: 71 mg/dL (ref 0.0–149.0)
VLDL: 14.2 mg/dL (ref 0.0–40.0)

## 2010-11-21 LAB — COMPREHENSIVE METABOLIC PANEL
AST: 18 U/L (ref 0–37)
Albumin: 3.9 g/dL (ref 3.5–5.2)
Alkaline Phosphatase: 49 U/L (ref 39–117)
Potassium: 4.6 mEq/L (ref 3.5–5.1)
Sodium: 138 mEq/L (ref 135–145)
Total Bilirubin: 1.3 mg/dL — ABNORMAL HIGH (ref 0.3–1.2)
Total Protein: 6.9 g/dL (ref 6.0–8.3)

## 2010-11-21 LAB — HEPATIC FUNCTION PANEL
ALT: 18 U/L (ref 0–53)
AST: 18 U/L (ref 0–37)
Albumin: 3.9 g/dL (ref 3.5–5.2)
Alkaline Phosphatase: 49 U/L (ref 39–117)

## 2010-11-21 LAB — PSA: PSA: 1.99 ng/mL (ref 0.10–4.00)

## 2010-11-21 MED ORDER — TETANUS-DIPHTH-ACELL PERTUSSIS 5-2.5-18.5 LF-MCG/0.5 IM SUSP: 0.5000 mL | Freq: Once | INTRAMUSCULAR | Status: DC

## 2010-11-21 MED ORDER — PNEUMOCOCCAL VAC POLYVALENT 25 MCG/0.5ML IJ INJ: 0.5000 mL | INJECTION | Freq: Once | INTRAMUSCULAR | Status: DC

## 2010-11-21 NOTE — Progress Notes (Signed)
Subjective:    Patient ID: Billy [name], male    DOB: 1941/04/21, 70 y.o.   MRN: [id number]  HPI The patient is here for annual Medicare wellness examination and management of other chronic and acute problems. Feeling and doing well with no interval illness, no injuries or surgeries.    The risk factors are reflected in the social history.  The roster of all physicians providing medical care to patient - is listed in the Snapshot section of the chart.  Activities of daily living:  The patient is 100% inedpendent in all ADLs: dressing, toileting, feeding as well as independent mobility  Home safety : The patient has smoke detectors in the home. They wear seatbelts. firearms at home ( firearms are present in the home, kept in a safe fashion). There is no violence in the home.   There is no risks for hepatitis, STDs or HIV. There is no   history of blood transfusion. They have no travel history to infectious disease endemic areas of the world.  The patient has seen their dentist in the last six month. They have not seen their eye doctor in the last year. They deny any hearing difficulty and have not had audiologic testing in the last year.  They do have excessive sun exposure but states he sees his dermatologist every year. Discussed the need for sun protection: hats, long sleeves and use of sunscreen if there is significant sun exposure.   Diet: the importance of a healthy diet is discussed. They do have a healthy (unhealthy-high fat/fast food) diet.     Review of Systems Review of Systems  Constitutional:  Negative for fever, chills, activity change and unexpected weight change.  HENT:  Negative for hearing loss, ear pain, congestion, neck stiffness and postnasal drip.   Eyes: Negative for pain, discharge and visual disturbance.  Respiratory: Negative for chest tightness and wheezing.   Cardiovascular: Negative for chest pain and palpitations.       [No decreased exercise  tolerance Gastrointestinal: [No change in bowel habit. No bloating or gas. No reflux or indigestion Genitourinary: Negative for urgency, frequency, flank pain and difficulty urinating.  Musculoskeletal: Negative for myalgias, back pain, arthralgias and gait problem.  Neurological: Negative for dizziness, tremors, weakness and headaches.  Hematological: Negative for adenopathy.  Psychiatric/Behavioral: Negative for behavioral problems and dysphoric mood.       Objective:   Physical Exam   Constitutional: He is oriented to person, place, and time. He appears well-developed and well-nourished.       Healthy appearing white male in no acute distress  HENT:  Head: Normocephalic and atraumatic.  Right Ear: External ear normal.  Left Ear: External ear normal.  Nose: Nose normal.  Mouth/Throat: Oropharynx is clear and moist.  Eyes: Conjunctivae and EOM are normal. Pupils are equal, round, and reactive to light. Right eye exhibits no discharge. Left eye exhibits no discharge. No scleral icterus.  Neck: Normal range of motion. Neck supple. No JVD present. No tracheal deviation present. No thyromegaly present.  Cardiovascular: Normal rate, regular rhythm and normal heart sounds.  Exam reveals no gallop and no friction rub.   No murmur heard.      Quiet precordium. 2+ radial and DP pulses  Pulmonary/Chest: Effort normal. No respiratory distress. He has no wheezes. He has no rales. He exhibits no tenderness.       No chest wall deformity  Abdominal: Soft. Bowel sounds are normal. He exhibits no distension. There is no tenderness.  There is no rebound and no guarding.       No heptosplenomegaly  Musculoskeletal: Normal range of motion. He exhibits no edema and no tenderness.       Small and large joints without redness, synovial thickening or deformity. Full range of motion preserved about all small, median and large joints.  Lymphadenopathy:    He has no cervical adenopathy.  Neurological: He is  alert and oriented to person, place, and time. He has normal reflexes. No cranial nerve deficit. Coordination normal.  Skin: Skin is warm and dry. No rash noted. No erythema.  Psychiatric: He has a normal mood and affect. His behavior is normal. Thought content normal.      Assessment & Plan:  1. Abnormal EKG - no prior cardiac problems, no symptoms. Risk profile includes age, male gender and h/o hyperlipidemia. EKG with deep q waves V1, V2.  Plan - risk stratification with myoview study.  2. Lipids - for routine lab with recommendations to follow.  3. Health maintenance - unremarkable interval h/o. Physical exam is normal. Lab - ordered and pending. Current with colorectal cancer screening - Feb '2011. Immuniations - given tetnus booster and pneumonia vaccine today. He will check on coverage for shingles vaccine.   In summary - a very nice man who appears to be medically stable except for abnormal EKG with work-up as ordered. He will return as needed or in 1 year.

## 2010-11-21 NOTE — Progress Notes (Signed)
Addended byRosalio Macadamia, Sanaiyah Kirchhoff on: 11/21/2010 10:36 AM   Modules accepted: Orders

## 2010-11-27 ENCOUNTER — Encounter: Payer: Self-pay | Admitting: Internal Medicine

## 2010-11-28 ENCOUNTER — Ambulatory Visit (HOSPITAL_COMMUNITY): Payer: Medicare Other | Attending: Internal Medicine | Admitting: Radiology

## 2010-11-28 VITALS — Ht 71.0 in | Wt 192.0 lb

## 2010-11-28 DIAGNOSIS — I491 Atrial premature depolarization: Secondary | ICD-10-CM

## 2010-11-28 DIAGNOSIS — R0609 Other forms of dyspnea: Secondary | ICD-10-CM

## 2010-11-28 DIAGNOSIS — R9431 Abnormal electrocardiogram [ECG] [EKG]: Secondary | ICD-10-CM | POA: Insufficient documentation

## 2010-11-28 MED ORDER — TECHNETIUM TC 99M TETROFOSMIN IV KIT
11.0000 | PACK | Freq: Once | INTRAVENOUS | Status: AC | PRN
  Administered 2010-11-28: 11 via INTRAVENOUS

## 2010-11-28 MED ORDER — TECHNETIUM TC 99M TETROFOSMIN IV KIT
33.0000 | PACK | Freq: Once | INTRAVENOUS | Status: AC | PRN
  Administered 2010-11-28: 33 via INTRAVENOUS

## 2010-11-28 NOTE — Progress Notes (Signed)
[facility] SITE 3 NUCLEAR MED [address] [place] [address] [phone]  Cardiology Nuclear Med Billy [name] is a 70 y.o. male [id number] [birth year]   Nuclear Med Background Indication for Stress Test:  Evaluation for Ischemia and Abnormal EKG History:  No previous documented CAD Cardiac Risk Factors: Lipids  Symptoms:  DOE and Palpitations   Nuclear Pre-Procedure Caffeine/Decaff Intake:  None NPO After: 8:00pm   Lungs:  clear IV 0.9% NS with Angio Cath:  18g  IV Site: R Antecubital  IV Started by:  Stanton Kidney, EMT-P  Chest Size (in):  40 Cup Size: n/a  Height: 5\' 11"  (1.803 m)  Weight:  192 lb (87.091 kg)  BMI:  Body mass index is 26.78 kg/(m^2). Tech Comments:  The patient had several BPS with elevated Diastolic numbers. Recheck after his test was completed was 124/78.    Nuclear Med Study 1 or 2 day study: 1 day  Stress Test Type:  Stress  Reading MD: Cassell Clement, MD  Order Authorizing Provider:  M.Norins  Resting Radionuclide: Technetium 68m Tetrofosmin  Resting Radionuclide Dose: 11.0 mCi   Stress Radionuclide:  Technetium 76m Tetrofosmin  Stress Radionuclide Dose: 33.0 mCi           Stress Protocol Rest HR: 60 Stress HR: 129  Rest BP: 159/92 Stress BP: 176/96  Exercise Time (min): 9:00 METS: 10.10   Predicted Max HR: 151 bpm % Max HR: 85.43 bpm Rate Pressure Product: 56433   Dose of Adenosine (mg):  n/a Dose of Lexiscan: n/a mg  Dose of Atropine (mg): n/a Dose of Dobutamine: n/a mcg/kg/min (at max HR)  Stress Test Technologist: Milana Na, EMT-P  Nuclear Technologist:  Harlow Asa, CNMT     Rest Procedure:  Myocardial perfusion imaging was performed at rest 45 minutes following the intravenous administration of Technetium 71m Tetrofosmin. Rest ECG: NSR  Stress Procedure:  The patient exercised for 9:00.  The patient stopped due to fatigue and denied any chest pain.  There were no significant ST-T wave  changes and rare pacs.  Technetium 71m Tetrofosmin was injected at peak exercise and myocardial perfusion imaging was performed after a brief delay. Stress ECG: No significant ST segment change suggestive of ischemia.  QPS Raw Data Images:  Normal; no motion artifact; normal heart/lung ratio. Stress Images:  Normal homogeneous uptake in all areas of the myocardium. Rest Images:  Normal homogeneous uptake in all areas of the myocardium. Subtraction (SDS):  There is no evidence of scar or ischemia. Transient Ischemic Dilatation (Normal <1.22):  0.99 Lung/Heart Ratio (Normal <0.45):  0.29  Quantitative Gated Spect Images QGS EDV:  103 ml QGS ESV:  44 ml QGS cine images:  NL LV Function; NL Wall Motion QGS EF: 57%  Impression Exercise Capacity:  Good exercise capacity. BP Response:  Normal blood pressure response. Clinical Symptoms:  No chest pain. ECG Impression:  No significant ST segment change suggestive of ischemia. Comparison with Prior Nuclear Study: No previous nuclear study performed  Overall Impression:  Low risk stress nuclear study.  Minor nonspecific ST-T changes with exercise. No ischemia on perfusion scans.  Normal LV wall motion.   Cassell Clement

## 2010-11-29 NOTE — Progress Notes (Signed)
ROUTED TO DR. Debby Bud.Mirna Mires

## 2010-12-01 ENCOUNTER — Telehealth: Payer: Self-pay | Admitting: *Deleted

## 2010-12-01 NOTE — Telephone Encounter (Signed)
Message copied by Lamar Sprinkles on Thu Dec 01, 2010  5:18 PM ------      Message from: Illene Regulus      Created: Thu Dec 01, 2010 12:30 PM       Please call patient - stress test was normal.             thanks

## 2010-12-02 NOTE — Telephone Encounter (Signed)
Patient informed. 

## 2010-12-05 ENCOUNTER — Encounter: Payer: Self-pay | Admitting: Cardiology

## 2010-12-09 ENCOUNTER — Ambulatory Visit: Payer: No Typology Code available for payment source | Admitting: Cardiology

## 2011-05-23 ENCOUNTER — Other Ambulatory Visit (INDEPENDENT_AMBULATORY_CARE_PROVIDER_SITE_OTHER): Payer: Medicare Other

## 2011-05-23 ENCOUNTER — Ambulatory Visit (INDEPENDENT_AMBULATORY_CARE_PROVIDER_SITE_OTHER): Payer: Medicare Other | Admitting: Internal Medicine

## 2011-05-23 VITALS — BP 128/84 | HR 56 | Temp 98.6°F | Wt 196.0 lb

## 2011-05-23 DIAGNOSIS — E785 Hyperlipidemia, unspecified: Secondary | ICD-10-CM

## 2011-05-23 LAB — LIPID PANEL
Total CHOL/HDL Ratio: 3
Triglycerides: 69 mg/dL (ref 0.0–149.0)

## 2011-05-26 NOTE — Assessment & Plan Note (Signed)
LDL 110 - at goal

## 2011-05-26 NOTE — Progress Notes (Signed)
  Subjective:    Patient ID: Billy [name], male    DOB: 07/22/1941, 70 y.o.   MRN: [id number]  HPI Mr. Gencarelli presents for follow-up of Cholesterol. He was supposed to come in for lab, not an office visit. He is sent to the lab and will be notified of results.    Review of Systems     Objective:   Physical Exam        Assessment & Plan:

## 2011-05-28 ENCOUNTER — Encounter: Payer: Self-pay | Admitting: Internal Medicine

## 2012-05-28 ENCOUNTER — Telehealth: Payer: Self-pay | Admitting: Internal Medicine

## 2012-05-28 NOTE — Telephone Encounter (Signed)
Pt is requesting that an RX for Zostavax be sent to [facility] on [address].

## 2012-05-29 ENCOUNTER — Other Ambulatory Visit: Payer: Self-pay | Admitting: Physician Assistant

## 2012-05-29 DIAGNOSIS — C4491 Basal cell carcinoma of skin, unspecified: Secondary | ICD-10-CM

## 2012-05-29 HISTORY — DX: Basal cell carcinoma of skin, unspecified: C44.91

## 2012-05-30 MED ORDER — ZOSTER VACCINE LIVE 19400 UNT/0.65ML ~~LOC~~ SOLR: 0.6500 mL | Freq: Once | SUBCUTANEOUS | Status: DC

## 2012-05-30 NOTE — Telephone Encounter (Signed)
done

## 2012-05-30 NOTE — Telephone Encounter (Signed)
Pt informed rx for shingles vaccine  sent to  [facility] pharmacy via VM and to callback office with any questions/concerns.

## 2012-07-24 ENCOUNTER — Other Ambulatory Visit (INDEPENDENT_AMBULATORY_CARE_PROVIDER_SITE_OTHER): Payer: Medicare Other

## 2012-07-24 ENCOUNTER — Encounter: Payer: Self-pay | Admitting: Internal Medicine

## 2012-07-24 ENCOUNTER — Ambulatory Visit (INDEPENDENT_AMBULATORY_CARE_PROVIDER_SITE_OTHER): Payer: Medicare Other | Admitting: Internal Medicine

## 2012-07-24 VITALS — BP 132/90 | HR 83 | Temp 98.2°F | Resp 10 | Ht 71.5 in | Wt 196.0 lb

## 2012-07-24 DIAGNOSIS — M109 Gout, unspecified: Secondary | ICD-10-CM | POA: Insufficient documentation

## 2012-07-24 DIAGNOSIS — E785 Hyperlipidemia, unspecified: Secondary | ICD-10-CM

## 2012-07-24 DIAGNOSIS — K648 Other hemorrhoids: Secondary | ICD-10-CM

## 2012-07-24 DIAGNOSIS — Z125 Encounter for screening for malignant neoplasm of prostate: Secondary | ICD-10-CM

## 2012-07-24 DIAGNOSIS — Z0000 Encounter for general adult medical examination without abnormal findings: Secondary | ICD-10-CM

## 2012-07-24 LAB — COMPREHENSIVE METABOLIC PANEL
CO2: 28 mEq/L (ref 19–32)
Creatinine, Ser: 0.9 mg/dL (ref 0.4–1.5)
GFR: 89.47 mL/min (ref >60.00)
Glucose, Bld: 95 mg/dL (ref 70–99)
Total Bilirubin: 1.4 mg/dL — ABNORMAL HIGH (ref 0.3–1.2)

## 2012-07-24 LAB — HEPATIC FUNCTION PANEL
ALT: 19 U/L (ref 0–53)
AST: 19 U/L (ref 0–37)
Albumin: 4.1 g/dL (ref 3.5–5.2)
Alkaline Phosphatase: 49 U/L (ref 39–117)
Bilirubin, Direct: 0.1 mg/dL (ref 0.0–0.3)
Total Bilirubin: 1.4 mg/dL — ABNORMAL HIGH (ref 0.3–1.2)
Total Protein: 7.4 g/dL (ref 6.0–8.3)

## 2012-07-24 LAB — LIPID PANEL
Cholesterol: 198 mg/dL (ref 0–200)
HDL: 52.2 mg/dL (ref >39.00)
LDL Cholesterol: 131 mg/dL — ABNORMAL HIGH (ref 0–99)
Total CHOL/HDL Ratio: 4
Triglycerides: 73 mg/dL (ref 0.0–149.0)
VLDL: 14.6 mg/dL (ref 0.0–40.0)

## 2012-07-24 NOTE — Progress Notes (Signed)
Subjective:    Patient ID: Billy [name], male    DOB: [birth year], 71 y.o.   MRN: [id number]  HPI The patient is here for annual Medicare wellness examination and management of other chronic and acute problems. He has been healthy. No major illness, no injury, no surgery.   The risk factors are reflected in the social history.  The roster of all physicians providing medical care to patient - is listed in the Snapshot section of the chart.  Activities of daily living:  The patient is 100% inedpendent in all ADLs: dressing, toileting, feeding as well as independent mobility  Home safety : The patient has smoke detectors in the home. Fall - no falls.  They wear seatbelts.  firearms are present in the home, kept in a safe fashion . There is no violence in the home.   There is no risks for hepatitis, STDs or HIV. There is no history of blood transfusion. They have no travel history to infectious disease endemic areas of the world.  The patient has seen their dentist in the last six month. They have seen their eye doctor in the last year. They deny any hearing difficulty and have not had audiologic testing in the last year.    They do not  have excessive sun exposure. Discussed the need for sun protection: hats, long sleeves and use of sunscreen if there is significant sun exposure.   Diet: the importance of a healthy diet is discussed. They do have a healthy diet.  The patient has a regular exercise program.  The benefits of regular aerobic exercise were discussed.  Depression screen: there are no signs or vegative symptoms of depression- irritability, change in appetite, anhedonia, sadness/tearfullness.  Cognitive assessment: the patient manages all their financial and personal affairs and is actively engaged.   Past Medical History  Diagnosis Date  . Plantar fascial fibromatosis   . Internal hemorrhoids without mention of complication   . Benign neoplasm of colon   . Prostatitis   .  Rib fracture   . Finger fracture   . Hx of colonoscopy    Past Surgical History  Procedure Date  . Polypectomy   . Varicocelectomy     Left  . Tonsillectomy    Family History  Problem Relation Age of Onset  . Heart disease Mother     ? CAD  . Stroke Father   . Diabetes Neg Hx   . Prostate cancer Neg Hx   . Colon cancer Neg Hx    History   Social History  . Marital Status: Married    Spouse Name: N/A    Number of Children: 2  . Years of Education: N/A   Occupational History  . Retired    Social History Main Topics  . Smoking status: Former Smoker    Quit date: 08/07/1978  . Smokeless tobacco: Not on file  . Alcohol Use: Not on file  . Drug Use: Not on file  . Sexually Active: Not on file   Other Topics Concern  . 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   During the course of the visit the patient was educated and counseled about appropriate screening  and preventive services including : fall prevention , diabetes screening, nutrition counseling, colorectal cancer screening, and recommended immunizations.    Review of Systems Constitutional:  Negative for fever, chills, activity change and unexpected weight change.  HEENT:  Negative for hearing loss, ear pain, congestion, neck stiffness and postnasal drip. Negative for sore throat or swallowing problems. Negative for dental complaints.   Eyes: Negative for vision loss or change in visual acuity.  Respiratory: Negative for chest tightness and wheezing. Negative for DOE.   Cardiovascular: Negative for chest pain or palpitations. No decreased exercise tolerance Gastrointestinal: No change in bowel habit. No bloating or gas. No reflux  or indigestion Genitourinary: Negative for urgency, frequency, flank pain and difficulty urinating.  Musculoskeletal: Negative for myalgias, back pain, arthralgias and gait problem.  Neurological: Negative for dizziness, tremors, weakness and headaches.  Hematological: Negative for adenopathy.  Psychiatric/Behavioral: Negative for behavioral problems and dysphoric mood.       Objective:   Physical Exam Filed Vitals:   07/24/12 1445  BP: 132/90  Pulse: 83  Temp: 98.2 F (36.8 C)  Resp: 10   Wt Readings from Last 3 Encounters:  07/24/12 196 lb (88.905 kg)  05/23/11 196 lb (88.905 kg)  11/28/10 192 lb (87.091 kg)   Gen'l: Well nourished well developed white male in no acute distress looking younger than his stated age.  HEENT: Head: Normocephalic and atraumatic. Right Ear: External ear normal. EAC/TM nl. Left Ear: External ear normal.  EAC/TM nl. Nose: Nose normal. Mouth/Throat: Oropharynx is clear and moist. Dentition - native, in good repair. No buccal or palatal lesions. Posterior pharynx clear. Eyes: Conjunctivae and sclera clear. EOM intact. Pupils are equal, round, and reactive to light. Right eye exhibits no discharge. Left eye exhibits no discharge. Neck: Normal range of motion. Neck supple. No JVD present. No tracheal deviation present. No thyromegaly present.  Cardiovascular: Normal rate, regular rhythm, no gallop, no friction rub, no murmur heard.      Quiet precordium. 2+ radial and DP pulses . No carotid bruits Pulmonary/Chest: Effort normal. No respiratory distress or increased WOB, no wheezes, no rales. No chest wall deformity or CVAT. Abdomen: Soft. Bowel sounds are normal in all quadrants. He exhibits no distension, no tenderness, no rebound or guarding, No heptosplenomegaly  Genitourinary:  deferred Musculoskeletal: Normal range of motion. He exhibits no edema and no tenderness.       Small and large joints without redness, synovial thickening or deformity. Full range  of motion preserved about all small, median and large joints.  Lymphadenopathy:    He has no cervical or supraclavicular adenopathy.  Neurological: He is alert and oriented to person, place, and time. CN II-XII intact. DTRs 2+ and symmetrical biceps, radial and patellar tendons. Cerebellar function normal with no tremor, rigidity, normal gait and station.  Skin: Skin is warm and dry. No rash noted. No erythema.  Psychiatric: He has a normal mood and affect. His behavior is normal. Thought content normal.   Lab Results  Component Value Date   WBC 7.6 11/21/2010   HGB 14.8 11/21/2010   HCT 43.8 11/21/2010   PLT 257.0 11/21/2010   GLUCOSE 95 07/24/2012   CHOL 198 07/24/2012   TRIG 73.0 07/24/2012   HDL 52.20 07/24/2012   LDLDIRECT 144.0 11/21/2010   LDLCALC 131* 07/24/2012        ALT 19 07/24/2012   AST 19 07/24/2012        NA 138 07/24/2012   K 4.2 07/24/2012  CL 104 07/24/2012   CREATININE 0.9 07/24/2012   BUN 20 07/24/2012   CO2 28 07/24/2012   TSH 1.78 11/21/2010   PSA 1.99 11/21/2010         Assessment & Plan:

## 2012-07-24 NOTE — Patient Instructions (Addendum)
Thanks for coming in.  You look healthy and well.   Full report to follow with labs and you can sign up for MyChart and get electronic transmission of labs.  Happy Holiday to you and all your family

## 2012-07-25 ENCOUNTER — Other Ambulatory Visit: Payer: Self-pay | Admitting: Physician Assistant

## 2012-07-28 DIAGNOSIS — Z0000 Encounter for general adult medical examination without abnormal findings: Secondary | ICD-10-CM | POA: Insufficient documentation

## 2012-07-28 NOTE — Assessment & Plan Note (Signed)
Interval history is benign. Physical exam is normal. Lab results are in normal range except for minimally elevated LDL cholesterol. He is current with colorectal cancer screening; he has aged out of prostate cancer screening. Immunizations are up to date.   In summary - a very nice man who is medically stable at this time. He is to continue his active/healthy life-style.  He will return in 1 year, sooner if needed.

## 2012-07-28 NOTE — Assessment & Plan Note (Addendum)
LDL is very, very close to goal of 130 or less. Framingham cardiac event risk calculation = 14% risk of cardiac event in the next 10 years (low risk 0-10%; moderate risk 11-20%). Biggest drivers being age and BP  Plan Continued healthy life-style.   Continue Red Yeast Rice

## 2012-07-28 NOTE — Assessment & Plan Note (Signed)
Occasional flares - has responded to Anusol HC 2.5% suppositories in the past.  Plan- Rx renewed for use as needed.

## 2012-10-23 ENCOUNTER — Other Ambulatory Visit: Payer: Self-pay | Admitting: Physician Assistant

## 2012-12-26 ENCOUNTER — Other Ambulatory Visit: Payer: Self-pay | Admitting: Physician Assistant

## 2014-02-17 ENCOUNTER — Other Ambulatory Visit: Payer: Self-pay | Admitting: Physician Assistant

## 2014-05-11 ENCOUNTER — Encounter: Payer: Self-pay | Admitting: Gastroenterology

## 2014-05-21 ENCOUNTER — Ambulatory Visit (INDEPENDENT_AMBULATORY_CARE_PROVIDER_SITE_OTHER): Payer: Medicare Other | Admitting: Family Medicine

## 2014-05-21 ENCOUNTER — Encounter: Payer: Self-pay | Admitting: Family Medicine

## 2014-05-21 VITALS — BP 110/80 | HR 60 | Temp 97.6°F | Wt 195.0 lb

## 2014-05-21 DIAGNOSIS — E785 Hyperlipidemia, unspecified: Secondary | ICD-10-CM

## 2014-05-21 DIAGNOSIS — Z87891 Personal history of nicotine dependence: Secondary | ICD-10-CM

## 2014-05-21 DIAGNOSIS — IMO0001 Reserved for inherently not codable concepts without codable children: Secondary | ICD-10-CM

## 2014-05-21 LAB — LIPID PANEL
CHOL/HDL RATIO: 4
Cholesterol: 201 mg/dL — ABNORMAL HIGH (ref 0–200)
HDL: 44.9 mg/dL (ref >39.00)
LDL Cholesterol: 136 mg/dL — ABNORMAL HIGH (ref 0–99)
NONHDL: 156.1
TRIGLYCERIDES: 102 mg/dL (ref 0.0–149.0)
VLDL: 20.4 mg/dL (ref 0.0–40.0)

## 2014-05-21 LAB — COMPREHENSIVE METABOLIC PANEL
ALBUMIN: 3.5 g/dL (ref 3.5–5.2)
ALT: 23 U/L (ref 0–53)
AST: 22 U/L (ref 0–37)
Alkaline Phosphatase: 42 U/L (ref 39–117)
BUN: 18 mg/dL (ref 6–23)
CHLORIDE: 105 meq/L (ref 96–112)
CO2: 27 mEq/L (ref 19–32)
Calcium: 9.1 mg/dL (ref 8.4–10.5)
Creatinine, Ser: 0.9 mg/dL (ref 0.4–1.5)
GFR: 86.76 mL/min (ref >60.00)
Glucose, Bld: 87 mg/dL (ref 70–99)
POTASSIUM: 4.4 meq/L (ref 3.5–5.1)
Sodium: 139 mEq/L (ref 135–145)
Total Bilirubin: 1.2 mg/dL (ref 0.2–1.2)
Total Protein: 7.3 g/dL (ref 6.0–8.3)

## 2014-05-21 LAB — POCT URINALYSIS DIPSTICK
Bilirubin, UA: NEGATIVE
Blood, UA: NEGATIVE
GLUCOSE UA: NEGATIVE
Ketones, UA: NEGATIVE
Leukocytes, UA: NEGATIVE
Nitrite, UA: NEGATIVE
Protein, UA: NEGATIVE
Spec Grav, UA: <=1.005
UROBILINOGEN UA: 0.2
pH, UA: 5

## 2014-05-21 LAB — CBC
HCT: 43.4 % (ref 39.0–52.0)
HEMOGLOBIN: 14.4 g/dL (ref 13.0–17.0)
MCHC: 33.3 g/dL (ref 30.0–36.0)
MCV: 91.8 fl (ref 78.0–100.0)
Platelets: 260 10*3/uL (ref 150.0–400.0)
RBC: 4.73 Mil/uL (ref 4.22–5.81)
RDW: 13.3 % (ref 11.5–15.5)
WBC: 8.2 10*3/uL (ref 4.0–10.5)

## 2014-05-21 LAB — TSH: TSH: 0.7 u[IU]/mL (ref 0.35–4.50)

## 2014-05-21 NOTE — Progress Notes (Signed)
Billy Reddish, MD Phone: [phone]  Subjective:  Patient presents today to establish care with me as their new primary care provider and for annual physical. Patient was formerly a patient of Dr. Linda Hedges. Chief complaint-noted.   Hyperlipidemia-mild poor control but no recent check  Lab Results  Component Value Date   LDLCALC 131* 07/24/2012   On statin: red yeast rice Regular exercise: yes ROS- no chest pain or shortness of breath. No myalgias  The following were reviewed and entered/updated in epic: Past Medical History  Diagnosis Date  . Plantar fascial fibromatosis   . Internal hemorrhoids without mention of complication   . Benign neoplasm of colon   . Prostatitis   . Rib fracture   . Finger fracture   . Hx of colonoscopy    Patient Active Problem List   Diagnosis Date Noted  . Hyperlipidemia 04/20/2008    Priority: Medium  . Former smoker 05/21/2014    Priority: Low  . Gout 07/24/2012    Priority: Low  . Abnormal finding on EKG 11/21/2010    Priority: Low  . History of skin cancer 04/20/2008    Priority: Low  . POLYP, COLON 12/23/2007    Priority: Low  . Internal hemorrhoids 12/23/2007    Priority: Low   Past Surgical History  Procedure Laterality Date  . Polypectomy    . Varicocelectomy      Left  . Tonsillectomy      Family History  Problem Relation Age of Onset  . Heart disease Mother     ? CAD. died 60  . Stroke Father     at age 9, smoked cigar  . Diabetes Neg Hx   . Prostate cancer Neg Hx   . Colon cancer Neg Hx     Medications- reviewed and updated Current Outpatient Prescriptions  Medication Sig Dispense Refill  . fish oil-omega-3 fatty acids 1000 MG capsule Take 2 g by mouth daily.        . Multiple Vitamins-Minerals (MULTIVITAMIN,TX-MINERALS) tablet Take 1 tablet by mouth daily.        . Red Yeast Rice 600 MG CAPS Take 2 capsules by mouth daily.         No current facility-administered medications for this visit.     Allergies-reviewed and updated Allergies  Allergen Reactions  . Ofloxacin     REACTION: rash    History   Social History  . Marital Status: Married    Spouse Name: N/A    Number of Children: 2  . Years of Education: N/A   Occupational History  . Retired    Social History Main Topics  . Smoking status: Former Smoker -- 1.00 packs/day for 20 years    Types: Cigarettes    Quit date: 08/07/1978  . Smokeless tobacco: None  . Alcohol Use: 7.5 oz/week    15 drink(s) per week     Comment: advised to cut to 2 a day or less  . Drug Use: No  . Sexual Activity: None   Other Topics Concern  . 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 Oropharynx normal. Good dentition CV: RRR no murmurs rubs or gallops Lungs: CTAB no crackles, wheeze, rhonchi Abdomen: soft/nontender/nondistended/normal bowel sounds.  Ext: no edema 2+ pulses but R >L Skin: warm, dry, no rash Neuro: grossly normal, moves all extremities, PERRLA  Assessment/Plan:  73 y.o. male presenting for annual physical.  Health Maintenance counseling: 1. Anticipatory guidance: Patient counseled regarding regular dental exams, wearing seatbelts.  2. Risk factor reduction:  Advised patient of need for regular exercise (biking, walking dog, skiing in winter, water skiing in [place], swimming) and diet rich and fruits and vegetables (patient eats diverse diet already including these) to reduce risk of heart attack  and stroke.  3. Immunizations/screenings/ancillary studies Health Maintenance Due  Topic Date Due  . Influenza Vaccine - had at [facility] 03/07/2014  Prevnar (different pneumonia vaccine needs that at next visit) 4. We discussed benefits risks of prostate cancer screening and jointly decided against PSA/rectal exam.   Hyperlipidemia Mild poor control previously. Check fasting labs today. Calculate 10 year risk to make recommendation. Likely that patient's risk will be >10% and will discuss benefits/risk of prescription statin.   Former smoker Ordered 1x AAA screening for history of smoking in man 21-75    nonfasting labs. Patient requests copy of results by mail.  Orders Placed This Encounter  Procedures  . CBC    [place]  . Comprehensive metabolic panel    [place]  . Lipid panel    [place]  . TSH    [place]  . POCT urinalysis dipstick  . Abdominal Aortic Aneurysm duplex    Standing Status: Future     Number of Occurrences:      Standing Expiration Date: 05/22/2015    Order Specific Question:  Where should this test be performed:    Answer:  [facility]

## 2014-05-21 NOTE — Assessment & Plan Note (Signed)
Ordered 1x AAA screening for history of smoking in man 22-75

## 2014-05-21 NOTE — Patient Instructions (Addendum)
73 y.o. male presenting for annual physical.  Health Maintenance counseling: 1. Anticipatory guidance: Patient counseled regarding regular dental exams, wearing seatbelts.  2. Risk factor reduction:  Advised patient of need for regular exercise (biking, walking dog, skiing in winter, water skiing in [place], swimming) and diet rich and fruits and vegetables (patient eats diverse diet already including these) to reduce risk of heart attack and stroke.  3. Immunizations/screenings/ancillary studies Health Maintenance Due  Topic Date Due  . Influenza Vaccine - [facility]/07/2014  Prevnar (different pneumonia vaccine needs that at next visit) 4. We discussed benefits risks of prostate cancer screening and jointly decided against PSA/rectal exam.  5. Labs today update fasting

## 2014-05-21 NOTE — Assessment & Plan Note (Signed)
Mild poor control previously. Check fasting labs today. Calculate 10 year risk to make recommendation. Likely that patient's risk will be >10% and will discuss benefits/risk of prescription statin.

## 2014-06-01 ENCOUNTER — Ambulatory Visit (HOSPITAL_COMMUNITY): Payer: Medicare Other | Attending: Cardiovascular Disease | Admitting: Cardiology

## 2014-06-01 DIAGNOSIS — Z87891 Personal history of nicotine dependence: Secondary | ICD-10-CM

## 2014-06-01 NOTE — Progress Notes (Signed)
Aorto-iliac duplex performed 

## 2014-06-03 ENCOUNTER — Telehealth: Payer: Self-pay

## 2014-06-03 NOTE — Telephone Encounter (Signed)
That is not something we do.

## 2014-06-03 NOTE — Telephone Encounter (Signed)
Pt states he would like a call to remind him to schedule his physical next year. He will be due 05/2015. Not sure how you handle reminder calls, but please contact pt around that time.

## 2014-08-07 HISTORY — PX: COLONOSCOPY: SHX174

## 2014-09-18 ENCOUNTER — Encounter: Payer: Self-pay | Admitting: Gastroenterology

## 2014-09-25 ENCOUNTER — Encounter: Payer: Self-pay | Admitting: Gastroenterology

## 2014-11-12 ENCOUNTER — Ambulatory Visit (AMBULATORY_SURGERY_CENTER): Payer: Self-pay | Admitting: *Deleted

## 2014-11-12 VITALS — Ht 73.0 in | Wt 198.8 lb

## 2014-11-12 DIAGNOSIS — Z860100 Personal history of colon polyps, unspecified: Secondary | ICD-10-CM

## 2014-11-12 MED ORDER — NA SULFATE-K SULFATE-MG SULF 17.5-3.13-1.6 GM/177ML PO SOLN: ORAL | Status: DC

## 2014-11-12 NOTE — Progress Notes (Signed)
No egg or soy allergy  No anesthesia or intubation problems per pt  No diet medications taken  Registered in EMMI   

## 2014-11-26 ENCOUNTER — Ambulatory Visit (AMBULATORY_SURGERY_CENTER): Payer: Medicare Other | Admitting: Gastroenterology

## 2014-11-26 ENCOUNTER — Encounter: Payer: Self-pay | Admitting: Gastroenterology

## 2014-11-26 VITALS — BP 113/80 | HR 51 | Temp 96.2°F | Resp 19 | Ht 73.0 in | Wt 198.0 lb

## 2014-11-26 DIAGNOSIS — D122 Benign neoplasm of ascending colon: Secondary | ICD-10-CM

## 2014-11-26 DIAGNOSIS — D123 Benign neoplasm of transverse colon: Secondary | ICD-10-CM

## 2014-11-26 DIAGNOSIS — Z86010 Personal history of colonic polyps: Secondary | ICD-10-CM | POA: Diagnosis not present

## 2014-11-26 MED ORDER — SODIUM CHLORIDE 0.9 % IV SOLN: 500.0000 mL | INTRAVENOUS | Status: DC

## 2014-11-26 NOTE — Progress Notes (Signed)
Called to room to assist during endoscopic procedure.  Patient ID and intended procedure confirmed with present staff. Received instructions for my participation in the procedure from the performing physician.  

## 2014-11-26 NOTE — Patient Instructions (Signed)
YOU HAD AN ENDOSCOPIC PROCEDURE TODAY AT THE [facility]:   Refer to the procedure report that was given to you for any specific questions about what was found during the examination.  If the procedure report does not answer your questions, please call your gastroenterologist to clarify.  If you requested that your care partner not be given the details of your procedure findings, then the procedure report has been included in a sealed envelope for you to review at your convenience later.  YOU SHOULD EXPECT: Some feelings of bloating in the abdomen. Passage of more gas than usual.  Walking can help get rid of the air that was put into your GI tract during the procedure and reduce the bloating. If you had a lower endoscopy (such as a colonoscopy or flexible sigmoidoscopy) you may notice spotting of blood in your stool or on the toilet paper. If you underwent a bowel prep for your procedure, you may not have a normal bowel movement for a few days.  Please Note:  You might notice some irritation and congestion in your nose or some drainage.  This is from the oxygen used during your procedure.  There is no need for concern and it should clear up in a day or so.  SYMPTOMS TO REPORT IMMEDIATELY:   Following lower endoscopy (colonoscopy or flexible sigmoidoscopy):  Excessive amounts of blood in the stool  Significant tenderness or worsening of abdominal pains  Swelling of the abdomen that is new, acute  Fever of 100F or higher   For urgent or emergent issues, a gastroenterologist can be reached at any hour by calling [phone].   DIET: Your first meal following the procedure should be a small meal and then it is ok to progress to your normal diet. Heavy or fried foods are harder to digest and may make you feel nauseous or bloated.  Likewise, meals heavy in dairy and vegetables can increase bloating.  Drink plenty of fluids but you should avoid alcoholic beverages for 24  hours.  ACTIVITY:  You should plan to take it easy for the rest of today and you should NOT DRIVE or use heavy machinery until tomorrow (because of the sedation medicines used during the test).    FOLLOW UP: Our staff will call the number listed on your records the next business day following your procedure to check on you and address any questions or concerns that you may have regarding the information given to you following your procedure. If we do not reach you, we will leave a message.  However, if you are feeling well and you are not experiencing any problems, there is no need to return our call.  We will assume that you have returned to your regular daily activities without incident.  If any biopsies were taken you will be contacted by phone or by letter within the next 1-3 weeks.  Please call us at [phone] if you have not heard about the biopsies in 3 weeks.    SIGNATURES/CONFIDENTIALITY: You and/or your care partner have signed paperwork which will be entered into your electronic medical record.  These signatures attest to the fact that that the information above on your After Visit Summary has been reviewed and is understood.  Full responsibility of the confidentiality of this discharge information lies with you and/or your care-partner.   Handouts were given to your care partner on polyps, hemorrhoids, and a high fiber diet with liberal fluid intake. You may resume your   current medications today. Await biopsy results. Please call if any questions or concerns.   

## 2014-11-26 NOTE — Progress Notes (Signed)
No problems noted in the recovery room. maw 

## 2014-11-26 NOTE — Op Note (Signed)
[place]  [address] [place], [id number]   COLONOSCOPY PROCEDURE REPORT  PATIENT: Billy [name], Billy [name]  MR#: [id number] BIRTHDATE: 1941/03/24 , 73  yrs. old GENDER: male ENDOSCOPIST: Ladene Artist, MD, [facility] PROCEDURE DATE:  11/26/2014 PROCEDURE:   Colonoscopy, surveillance and Colonoscopy with snare polypectomy First Screening Colonoscopy - Avg.  risk and is 50 yrs.  old or older - No.  Prior Negative Screening - Now for repeat screening. N/A  History of Adenoma - Now for follow-up colonoscopy & has been > or = to 3 yrs.  Yes hx of adenoma.  Has been 3 or more years since last colonoscopy. ASA CLASS:   Class II INDICATIONS:Surveillance due to prior colonic neoplasia and PH Colon Adenoma. MEDICATIONS: Monitored anesthesia care and Propofol 250 mg IV DESCRIPTION OF PROCEDURE:   After the risks benefits and alternatives of the procedure were thoroughly explained, informed consent was obtained.  The digital rectal exam revealed no abnormalities of the rectum.   The LB [id number]  endoscope was introduced through the anus and advanced to the cecum, which was identified by both the appendix and ileocecal valve. No adverse events experienced.   The quality of the prep was excellent. (Suprep was used)  The instrument was then slowly withdrawn as the colon was fully examined.    COLON FINDINGS: A sessile polyp measuring 7 mm in size was found in the ascending colon.  A polypectomy was performed with a cold snare.  The resection was complete, the polyp tissue was completely retrieved and sent to histology.   A sessile polyp measuring 5 mm in size was found in the transverse colon.  A polypectomy was performed with a cold snare.  The resection was complete, the polyp tissue was completely retrieved and sent to histology.   The examination was otherwise normal.  Retroflexed views revealed internal Grade I hemorrhoids. The time to cecum = 3.2 Withdrawal time =  10.6   The scope was withdrawn and the procedure completed. COMPLICATIONS: There were no immediate complications.  ENDOSCOPIC IMPRESSION: 1.   Sessile polyp in the ascending colon; polypectomy performed with a cold snare 2.   Sessile polyp in the transverse colon; polypectomy performed with a cold snare 3.   Grade l internal hemorrhoids  RECOMMENDATIONS: 1.  Await pathology results 2.  Repeat Colonoscopy in 5 years.  eSigned:  Ladene Artist, MD, [facility] 11/26/2014 9:26 AM

## 2014-11-26 NOTE — Progress Notes (Signed)
Patient awakening,vss,report to rn 

## 2014-11-27 ENCOUNTER — Telehealth: Payer: Self-pay | Admitting: *Deleted

## 2014-11-27 NOTE — Telephone Encounter (Signed)
  Follow up Call-  Call back number 11/26/2014  Post procedure Call Back phone  # [phone]  Permission to leave phone message Yes     Patient questions:  Do you have a fever, pain , or abdominal swelling? No. Pain Score  0 *  Have you tolerated food without any problems? Yes.    Have you been able to return to your normal activities? Yes.    Do you have any questions about your discharge instructions: Diet   No. Medications  No. Follow up visit  No.  Do you have questions or concerns about your Care? No.  Actions: * If pain score is 4 or above: No action needed, pain <4.

## 2014-12-10 ENCOUNTER — Encounter: Payer: Self-pay | Admitting: Gastroenterology

## 2014-12-11 NOTE — Addendum Note (Signed)
Addended by: Steva Ready on: 12/11/2014 04:37 PM   Modules accepted: Level of Service

## 2015-05-26 ENCOUNTER — Other Ambulatory Visit: Payer: Self-pay | Admitting: Physician Assistant

## 2015-06-09 ENCOUNTER — Telehealth: Payer: Self-pay | Admitting: Family Medicine

## 2015-06-09 NOTE — Telephone Encounter (Signed)
Pt would like physical before 2017. Can I create slot?

## 2015-06-10 NOTE — Telephone Encounter (Signed)
See below

## 2015-06-10 NOTE — Telephone Encounter (Signed)
Pt has been sch for 07/27/15 145 pm

## 2015-06-10 NOTE — Telephone Encounter (Signed)
No only if one of my 8:15, 8:45, 1:15, or 1:45 slots opens. Unfortunately for these 30 minute visits most patients needs to call at least 6 months out

## 2015-07-27 ENCOUNTER — Encounter: Payer: Self-pay | Admitting: Family Medicine

## 2015-07-27 ENCOUNTER — Ambulatory Visit (INDEPENDENT_AMBULATORY_CARE_PROVIDER_SITE_OTHER): Payer: Medicare Other | Admitting: Family Medicine

## 2015-07-27 VITALS — BP 136/80 | HR 62 | Temp 98.5°F | Wt 198.0 lb

## 2015-07-27 DIAGNOSIS — N401 Enlarged prostate with lower urinary tract symptoms: Secondary | ICD-10-CM

## 2015-07-27 DIAGNOSIS — Z23 Encounter for immunization: Secondary | ICD-10-CM

## 2015-07-27 DIAGNOSIS — E785 Hyperlipidemia, unspecified: Secondary | ICD-10-CM | POA: Diagnosis not present

## 2015-07-27 DIAGNOSIS — R1030 Lower abdominal pain, unspecified: Secondary | ICD-10-CM

## 2015-07-27 DIAGNOSIS — N5201 Erectile dysfunction due to arterial insufficiency: Secondary | ICD-10-CM

## 2015-07-27 DIAGNOSIS — R1031 Right lower quadrant pain: Secondary | ICD-10-CM

## 2015-07-27 DIAGNOSIS — R351 Nocturia: Secondary | ICD-10-CM

## 2015-07-27 DIAGNOSIS — Z0000 Encounter for general adult medical examination without abnormal findings: Secondary | ICD-10-CM

## 2015-07-27 DIAGNOSIS — N529 Male erectile dysfunction, unspecified: Secondary | ICD-10-CM | POA: Insufficient documentation

## 2015-07-27 DIAGNOSIS — R1032 Left lower quadrant pain: Secondary | ICD-10-CM

## 2015-07-27 LAB — POCT URINALYSIS DIPSTICK
Bilirubin, UA: NEGATIVE
Blood, UA: NEGATIVE
Glucose, UA: NEGATIVE
Ketones, UA: NEGATIVE
Leukocytes, UA: NEGATIVE
Nitrite, UA: NEGATIVE
Protein, UA: NEGATIVE
Spec Grav, UA: 1.015
Urobilinogen, UA: 0.2
pH, UA: 5.5

## 2015-07-27 LAB — COMPREHENSIVE METABOLIC PANEL
ALBUMIN: 4.1 g/dL (ref 3.5–5.2)
ALT: 17 U/L (ref 0–53)
AST: 16 U/L (ref 0–37)
Alkaline Phosphatase: 46 U/L (ref 39–117)
BUN: 20 mg/dL (ref 6–23)
CO2: 27 mEq/L (ref 19–32)
Calcium: 9.2 mg/dL (ref 8.4–10.5)
Chloride: 105 mEq/L (ref 96–112)
Creatinine, Ser: 0.86 mg/dL (ref 0.40–1.50)
GFR: 92.31 mL/min (ref >60.00)
Glucose, Bld: 90 mg/dL (ref 70–99)
POTASSIUM: 4.5 meq/L (ref 3.5–5.1)
Sodium: 141 mEq/L (ref 135–145)
Total Bilirubin: 1 mg/dL (ref 0.2–1.2)
Total Protein: 6.6 g/dL (ref 6.0–8.3)

## 2015-07-27 LAB — CBC
HEMATOCRIT: 44.7 % (ref 39.0–52.0)
Hemoglobin: 14.8 g/dL (ref 13.0–17.0)
MCHC: 33 g/dL (ref 30.0–36.0)
MCV: 92.1 fl (ref 78.0–100.0)
Platelets: 282 10*3/uL (ref 150.0–400.0)
RBC: 4.86 Mil/uL (ref 4.22–5.81)
RDW: 13.4 % (ref 11.5–15.5)
WBC: 8.6 10*3/uL (ref 4.0–10.5)

## 2015-07-27 LAB — LIPID PANEL
Cholesterol: 199 mg/dL (ref 0–200)
HDL: 48.8 mg/dL (ref >39.00)
LDL Cholesterol: 133 mg/dL — ABNORMAL HIGH (ref 0–99)
NonHDL: 150.44
Total CHOL/HDL Ratio: 4
Triglycerides: 86 mg/dL (ref 0.0–149.0)
VLDL: 17.2 mg/dL (ref 0.0–40.0)

## 2015-07-27 LAB — PSA: PSA: 2.26 ng/mL (ref 0.10–4.00)

## 2015-07-27 MED ORDER — SILDENAFIL CITRATE 100 MG PO TABS: 100.0000 mg | ORAL_TABLET | Freq: Every day | ORAL | Status: DC | PRN

## 2015-07-27 NOTE — Progress Notes (Signed)
Garret Reddish, MD Phone: [phone]  Subjective:  Patient presents today for their annual wellness visit.    Preventive Screening-Counseling & Management  Smoking Status: former Smoker Second Hand Smoking status: No smokers in home  Risk Factors Regular exercise: walk/bike 2-3 days a week for 60 minutes or more Diet: reasonable Wt Readings from Last 3 Encounters:  07/27/15 198 lb (89.812 kg)  11/26/14 198 lb (89.812 kg)  11/12/14 198 lb 12.8 oz (90.175 kg)  Fall Risk: None   Cardiac risk factors:  advanced age (older than 69 for men, 59 for women)  Hyperlipidemia with mild poor control No diabetes.  Family History: heart disease mother age 92, stroke father age 10 smoking cigars   Depression Screen None. PHQ2 0   Activities of Daily Living Independent ADLs and IADLs   Hearing Difficulties: -patient declines  Cognitive Testing No reported trouble.   Normal 3 word recall  List the Names of Other Physician/Practitioners you currently use: 1.[facility] 2. Dr. [facility]  Immunization History  Administered Date(s) Administered  . Influenza Split 06/23/2015  . Influenza-Unspecified 05/28/2012, 05/02/2013, 05/07/2014  . Pneumococcal Conjugate-13 07/27/2015  . Pneumococcal Polysaccharide-23 11/21/2010  . Td 11/08/1998  . Tdap 11/21/2010  . Zoster 05/24/2012   Required Immunizations needed today : prevnar 13  Screening tests- up to date  ROS- No pertinent positives discovered in course of AWV with exception of tightness in groin  The following were reviewed and entered/updated in epic: Past Medical History  Diagnosis Date  . Plantar fascial fibromatosis   . Internal hemorrhoids without mention of complication   . Benign neoplasm of colon   . Prostatitis   . Rib fracture   . Finger fracture   . Hx of colonoscopy    Patient Active Problem List   Diagnosis Date Noted  . Hyperlipidemia 04/20/2008    Priority: Medium  . Former  smoker 05/21/2014    Priority: Low  . Gout 07/24/2012    Priority: Low  . Abnormal finding on EKG 11/21/2010    Priority: Low  . History of skin cancer 04/20/2008    Priority: Low  . POLYP, COLON 12/23/2007    Priority: Low  . Internal hemorrhoids 12/23/2007    Priority: Low   Past Surgical History  Procedure Laterality Date  . Polypectomy    . Varicocelectomy      Left  . Tonsillectomy    . Colonoscopy      Family History  Problem Relation Age of Onset  . Heart disease Mother     ? CAD. died 72  . Stroke Father     at age 94, smoked cigar  . Diabetes Neg Hx   . Prostate cancer Neg Hx   . Colon cancer Neg Hx   . Stomach cancer Neg Hx   . Rectal cancer Neg Hx     Medications- reviewed and updated Current Outpatient Prescriptions  Medication Sig Dispense Refill  . fish oil-omega-3 fatty acids 1000 MG capsule Take 2 g by mouth daily.      . Multiple Vitamins-Minerals (MULTIVITAMIN,TX-MINERALS) tablet Take 1 tablet by mouth daily.      . Red Yeast Rice 600 MG CAPS Take 2 capsules by mouth daily.       No current facility-administered medications for this visit.    Allergies-reviewed and updated Allergies  Allergen Reactions  . Ofloxacin     REACTION: rash    Social History   Social History  . Marital Status: Married  Spouse Name: N/A  . Number of Children: 2  . Years of Education: N/A   Occupational History  . Retired    Social History Main Topics  . Smoking status: Former Smoker -- 1.00 packs/day for 20 years    Types: Cigarettes    Quit date: 08/07/1978  . Smokeless tobacco: Never Used  . Alcohol Use: 18.0 oz/week    15 Standard drinks or equivalent, 15 Cans of beer per week     Comment: advised to cut to 2 a day or less  . Drug Use: No  . Sexual Activity: Not Asked   Other Topics Concern  . None   Social History Narrative   [employer], 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 Oropharynx normal Neck: no thyromegaly CV: RRR no murmurs rubs or gallops Lungs: CTAB no crackles, wheeze, rhonchi Abdomen: soft/nontender/nondistended/normal bowel sounds. No rebound or guarding.  Rectal: normal tone with no bogginess, diffusely enlarged prostate, no masses or tenderness No hernia noted in groin Ext: no edema Skin: warm, dry Neuro: grossly normal, moves all extremities, PERRLA  Assessment/Plan:  AWV completed with traditional CPE also completed  74 y.o. male presenting for annual wellness visit.  Health Maintenance counseling: 1. Anticipatory guidance: Patient counseled regarding regular dental exams, eye exams, wearing seatbelts.  2. Risk factor reduction:  Advised patient of need for regular exercise and diet rich and fruits and vegetables to reduce risk of heart attack and stroke.  3. Immunizations/screenings/ancillary studies- final pneumonia shot prevnar 58 today Immunization History  Administered Date(s) Administered  . Influenza Split 06/23/2015  . Influenza-Unspecified 05/28/2012, 05/02/2013, 05/07/2014  . Pneumococcal Conjugate-13 07/27/2015  . Pneumococcal Polysaccharide-23 11/21/2010  . Td 11/08/1998  . Tdap 11/21/2010  . Zoster 05/24/2012    4. Prostate cancer screening- patient requests to be screened given BPH on exam, mild nocturia, and new groin tightness sensation which was similar to when he had prostatitis  Lab Results  Component Value Date   PSA 1.99 11/21/2010   PSA 1.40 09/30/2009   PSA 2.00 04/20/2008   5. Colon cancer screening - repeat 2021 due to adenomas  6. Skin  cancer screening- follows with [facility] due to history of basal cell  Reports fasting Orders Placed This Encounter  Procedures  . Pneumococcal conjugate vaccine 13-valent  . CBC    [place]  . Comprehensive metabolic panel    [place]    Order Specific Question:  Has the patient fasted?    Answer:  No  . Lipid panel        Order Specific Question:  Has the patient fasted?    Answer:  No  . PSA  . POCT urinalysis dipstick    In house   Erectile dysfunction S: can obtain but difficulty maintaining erection. Viagra has worked in past A/P: send in [place] for repeat trial    Bilateral groin pain S: going on for about  2-3 weeks. Feels like his boxers are always tight in his groin on both sides. Mild aching sensation. Worse with sitting, better with standing but persists in both positions.  Similar to when he had prostatitis in the past. Treated with antibiotics and healed A/P: prostate is normal in firmness, perhaps mild BPH. Stable nocturia so we will check PSA as well. ? msk strain. If persists more than a week or two, refer to urology  Informed patient with problem oriented concerns this would be AWV plus [phone]

## 2015-07-27 NOTE — Patient Instructions (Addendum)
[name] received today.  Viagra may not be covered but I sent in. 1/2 a pill should work so use that first which will make it cheaper.   Test for any changes in PSA- if ok. May monitor for the next week or two as possible musculoskeletal strain. If persists or worsens refer to urology   Billy [name] , Thank you for taking time to come for your Medicare Wellness Visit. I appreciate your ongoing commitment to your health goals. Please review the following plan we discussed and let me know if I can assist you in the future.   These are the goals we discussed: Goals    None      This is a list of the screening recommended for you and due dates:  Health Maintenance  Topic Date Due  . Flu Shot  03/07/2016  . Colon Cancer Screening  11/26/2019  . Tetanus Vaccine  11/20/2020  . Shingles Vaccine  Completed  . Pneumonia vaccines  Completed

## 2015-07-27 NOTE — Assessment & Plan Note (Signed)
S: can obtain but difficulty maintaining erection. Viagra has worked in past A/P: send in [place] for repeat trial

## 2015-07-28 ENCOUNTER — Encounter: Payer: Self-pay | Admitting: *Deleted

## 2015-07-28 MED ORDER — ATORVASTATIN CALCIUM 40 MG PO TABS: ORAL_TABLET | ORAL | Status: DC

## 2015-07-28 NOTE — Addendum Note (Signed)
Addended by: Agnes Lawrence on: 07/28/2015 04:46 PM   Modules accepted: Orders

## 2015-08-08 HISTORY — PX: OTHER SURGICAL HISTORY: SHX169

## 2015-08-24 ENCOUNTER — Other Ambulatory Visit: Payer: Self-pay | Admitting: Family Medicine

## 2015-08-24 ENCOUNTER — Telehealth: Payer: Self-pay | Admitting: Family Medicine

## 2015-08-24 DIAGNOSIS — R1030 Lower abdominal pain, unspecified: Secondary | ICD-10-CM

## 2015-08-24 MED ORDER — ATORVASTATIN CALCIUM 40 MG PO TABS: ORAL_TABLET | ORAL | Status: DC

## 2015-08-24 NOTE — Telephone Encounter (Signed)
Pt has a new pharmacy . [facility] [address]    Pt request refill of the following: atorvastatin (LIPITOR) 40 MG tablet   Phamacy: [facility]

## 2015-08-24 NOTE — Telephone Encounter (Signed)
Medication sent in and pharmacy updated, urology referral placed.

## 2015-08-24 NOTE — Telephone Encounter (Signed)
Pt said he still having some discomfort in the groin area. He said Dr Yong Channel told him if he still had it he would see him to see a Dealer.

## 2015-08-31 DIAGNOSIS — I25110 Atherosclerotic heart disease of native coronary artery with unstable angina pectoris: Secondary | ICD-10-CM | POA: Diagnosis not present

## 2015-08-31 DIAGNOSIS — I214 Non-ST elevation (NSTEMI) myocardial infarction: Secondary | ICD-10-CM | POA: Diagnosis not present

## 2015-08-31 DIAGNOSIS — Z7982 Long term (current) use of aspirin: Secondary | ICD-10-CM | POA: Diagnosis not present

## 2015-08-31 DIAGNOSIS — R079 Chest pain, unspecified: Secondary | ICD-10-CM | POA: Diagnosis not present

## 2015-08-31 DIAGNOSIS — Z87891 Personal history of nicotine dependence: Secondary | ICD-10-CM | POA: Diagnosis not present

## 2015-08-31 DIAGNOSIS — E785 Hyperlipidemia, unspecified: Secondary | ICD-10-CM | POA: Diagnosis not present

## 2015-08-31 DIAGNOSIS — F419 Anxiety disorder, unspecified: Secondary | ICD-10-CM | POA: Diagnosis not present

## 2015-08-31 DIAGNOSIS — R030 Elevated blood-pressure reading, without diagnosis of hypertension: Secondary | ICD-10-CM | POA: Diagnosis not present

## 2015-09-01 DIAGNOSIS — F419 Anxiety disorder, unspecified: Secondary | ICD-10-CM | POA: Diagnosis not present

## 2015-09-01 DIAGNOSIS — Z87891 Personal history of nicotine dependence: Secondary | ICD-10-CM | POA: Diagnosis not present

## 2015-09-01 DIAGNOSIS — I25110 Atherosclerotic heart disease of native coronary artery with unstable angina pectoris: Secondary | ICD-10-CM | POA: Diagnosis not present

## 2015-09-01 DIAGNOSIS — R030 Elevated blood-pressure reading, without diagnosis of hypertension: Secondary | ICD-10-CM | POA: Diagnosis not present

## 2015-09-01 DIAGNOSIS — E785 Hyperlipidemia, unspecified: Secondary | ICD-10-CM | POA: Diagnosis not present

## 2015-09-01 DIAGNOSIS — Z7982 Long term (current) use of aspirin: Secondary | ICD-10-CM | POA: Diagnosis not present

## 2015-09-02 DIAGNOSIS — E785 Hyperlipidemia, unspecified: Secondary | ICD-10-CM | POA: Diagnosis not present

## 2015-09-02 DIAGNOSIS — I25110 Atherosclerotic heart disease of native coronary artery with unstable angina pectoris: Secondary | ICD-10-CM | POA: Diagnosis not present

## 2015-09-02 DIAGNOSIS — F419 Anxiety disorder, unspecified: Secondary | ICD-10-CM | POA: Diagnosis not present

## 2015-09-02 DIAGNOSIS — Z7982 Long term (current) use of aspirin: Secondary | ICD-10-CM | POA: Diagnosis not present

## 2015-09-02 DIAGNOSIS — Z87891 Personal history of nicotine dependence: Secondary | ICD-10-CM | POA: Diagnosis not present

## 2015-09-02 DIAGNOSIS — R030 Elevated blood-pressure reading, without diagnosis of hypertension: Secondary | ICD-10-CM | POA: Diagnosis not present

## 2015-09-06 ENCOUNTER — Telehealth: Payer: Self-pay | Admitting: Family Medicine

## 2015-09-06 NOTE — Telephone Encounter (Signed)
Patient is requesting to xfer back over to [facility].   Dr. Yong Channel,  Is this ok with you?   Marya Amsler,  Is this ok with you?

## 2015-09-10 ENCOUNTER — Telehealth: Payer: Self-pay | Admitting: Family Medicine

## 2015-09-10 NOTE — Telephone Encounter (Signed)
Please see encounter below

## 2015-09-10 NOTE — Telephone Encounter (Signed)
Ok with me 

## 2015-09-10 NOTE — Telephone Encounter (Signed)
No concerns here- may transfer back

## 2015-09-13 ENCOUNTER — Telehealth: Payer: Self-pay | Admitting: Internal Medicine

## 2015-09-13 NOTE — Telephone Encounter (Signed)
Patient visited office and brought copy of Heart Catherization Report and CD of Heart Cath from [facility] in [place].  He was visiting in [place] and ended up in [place] at [facility]  He signed Release so that we could obtain all records from his hospital visit from 08/31/15 to 09/02/15 .Marland Kitchen  Faxed signed Release to [place] on 09/13/15. lp  Patient has appointment with Dr Debara Pickett on 09/21/15. lp  Records to be given to Dr Debara Pickett on 09/14/15 to review prior to appointment on 09/21/15. lp

## 2015-09-13 NOTE — Telephone Encounter (Signed)
Informed pt. Will call back to schedule an appt with Marya Amsler

## 2015-09-14 DIAGNOSIS — D2370 Other benign neoplasm of skin of unspecified lower limb, including hip: Secondary | ICD-10-CM | POA: Diagnosis not present

## 2015-09-14 DIAGNOSIS — D2339 Other benign neoplasm of skin of other parts of face: Secondary | ICD-10-CM | POA: Diagnosis not present

## 2015-09-21 ENCOUNTER — Encounter: Payer: Self-pay | Admitting: Internal Medicine

## 2015-09-21 ENCOUNTER — Ambulatory Visit (INDEPENDENT_AMBULATORY_CARE_PROVIDER_SITE_OTHER): Payer: PPO | Admitting: Internal Medicine

## 2015-09-21 VITALS — BP 126/82 | HR 45 | Ht 73.0 in | Wt 199.0 lb

## 2015-09-21 DIAGNOSIS — Z79899 Other long term (current) drug therapy: Secondary | ICD-10-CM

## 2015-09-21 DIAGNOSIS — E785 Hyperlipidemia, unspecified: Secondary | ICD-10-CM

## 2015-09-21 DIAGNOSIS — I2510 Atherosclerotic heart disease of native coronary artery without angina pectoris: Secondary | ICD-10-CM

## 2015-09-21 DIAGNOSIS — Z955 Presence of coronary angioplasty implant and graft: Secondary | ICD-10-CM | POA: Diagnosis not present

## 2015-09-21 MED ORDER — METOPROLOL TARTRATE 25 MG PO TABS: 12.5000 mg | ORAL_TABLET | Freq: Two times a day (BID) | ORAL | Status: DC

## 2015-09-21 MED ORDER — TICAGRELOR 90 MG PO TABS: 90.0000 mg | ORAL_TABLET | Freq: Two times a day (BID) | ORAL | Status: DC

## 2015-09-21 MED ORDER — ATORVASTATIN CALCIUM 40 MG PO TABS: 40.0000 mg | ORAL_TABLET | Freq: Every day | ORAL | Status: DC

## 2015-09-21 NOTE — Patient Instructions (Addendum)
Medication Instructions:   1. DECREASE metoprolol tartrate to 12.5mg  twice daily   Labwork:  Your physician recommends that you return for lab work in: 3 months (May 2017) - fasting - lipid, CMET   Testing/Procedures:  Dr. Debara Pickett has referred you to cardiac rehab @ [facility] Dr. Debara Pickett has referred you to Estelle Grumbles, dietician   Follow-Up:  Your physician recommends that you schedule a follow-up appointment in: 3 months with Dr. Debara Pickett after lab work   Any Other Special Instructions Will Be Listed Below (If Applicable).

## 2015-09-22 ENCOUNTER — Ambulatory Visit: Payer: Medicare Other | Admitting: Family Medicine

## 2015-09-22 ENCOUNTER — Telehealth: Payer: Self-pay | Admitting: *Deleted

## 2015-09-22 DIAGNOSIS — I2510 Atherosclerotic heart disease of native coronary artery without angina pectoris: Secondary | ICD-10-CM | POA: Insufficient documentation

## 2015-09-22 DIAGNOSIS — Z955 Presence of coronary angioplasty implant and graft: Secondary | ICD-10-CM | POA: Insufficient documentation

## 2015-09-22 NOTE — Telephone Encounter (Signed)
Emailed signed referral for medical nutrition therapy to Estelle Grumbles, RDN, CDE w/insurance information

## 2015-09-22 NOTE — Progress Notes (Signed)
OFFICE NOTE  Chief Complaint:  Establish cardiologist  Primary Care Physician: Garret Reddish, MD  HPI:  Billy [name] is a pleasant 75 year old male who unfortunately had a recent anterior MI. He was doing some skiing up in the [facility] and had some chest discomfort. This worsened over the period of 1-2 days and then he presented to the emergency department at [facility]. Subsequently he was found to have a small elevation in troponin consistent with non-ST elevation MI. He was started on heparin aspirin and Lipitor and cardiology was consult it. He underwent cardiac catheterization by Dr. Beryle Flock, which demonstrated a 90% ruptured plaque in the LAD that was visualized by either [place]. He underwent placement of a 3.0 mm x 22 mm integrity resolution drug-eluting stent. This reduced the 90% lesion to 0 and TIMI-3 flow was noted. He was placed on aspirin and Brilinta and felt much better after discharge. He was also placed on metoprolol 25 mg twice a day. Today he returns in follow-up. He has a myriad of questions for me. Most have to do with follow-up, medications, and limitations. He denies any chest pain or worsening shortness of breath since his discharge. Blood pressure stable today 126/82 however heart rate is low at 45.  PMHx:  Past Medical History  Diagnosis Date  . Plantar fascial fibromatosis   . Internal hemorrhoids without mention of complication   . Benign neoplasm of colon   . Prostatitis   . Rib fracture   . Finger fracture   . Hx of colonoscopy     Past Surgical History  Procedure Laterality Date  . Polypectomy    . Varicocelectomy      Left  . Tonsillectomy    . Colonoscopy      FAMHx:  Family History  Problem Relation Age of Onset  . Heart disease Mother     ? CAD. died 18  . Stroke Father     at age 50, smoked cigar  . Diabetes Neg Hx   . Prostate cancer Neg Hx   . Colon cancer Neg Hx   . Stomach cancer Neg Hx   . Rectal cancer  Neg Hx     SOCHx:   reports that he quit smoking about 37 years ago. His smoking use included Cigarettes. He has a 20 pack-year smoking history. He has never used smokeless tobacco. He reports that he drinks about 18.0 oz of alcohol per week. He reports that he does not use illicit drugs.  ALLERGIES:  Allergies  Allergen Reactions  . Ofloxacin     REACTION: rash    ROS: Pertinent items noted in HPI and remainder of comprehensive ROS otherwise negative.  HOME MEDS: Current Outpatient Prescriptions  Medication Sig Dispense Refill  . aspirin EC 81 MG tablet Take 81 mg by mouth daily.    Marland Kitchen atorvastatin (LIPITOR) 40 MG tablet Take 1 tablet (40 mg total) by mouth daily. 90 tablet 3  . Multiple Vitamins-Minerals (MULTIVITAMIN,TX-MINERALS) tablet Take 1 tablet by mouth daily.      . Omega-3 Fatty Acids (FISH OIL) 500 MG CAPS Take 1 capsule by mouth 2 (two) times daily.    . ticagrelor (BRILINTA) 90 MG TABS tablet Take 1 tablet (90 mg total) by mouth 2 (two) times daily. 180 tablet 3  . metoprolol tartrate (LOPRESSOR) 25 MG tablet Take 0.5 tablets (12.5 mg total) by mouth 2 (two) times daily. 90 tablet 3   No current facility-administered medications for this visit.  LABS/IMAGING: No results found for this or any previous visit (from the past 48 hour(s)). No results found.  WEIGHTS: Wt Readings from Last 3 Encounters:  09/21/15 199 lb (90.266 kg)  07/27/15 198 lb (89.812 kg)  11/26/14 198 lb (89.812 kg)    VITALS: BP 126/82 mmHg  Pulse 45  Ht 6' 1"  (1.854 m)  Wt 199 lb (90.266 kg)  BMI 26.26 kg/m2  EXAM: General appearance: alert and no distress Neck: no carotid bruit and no JVD Lungs: clear to auscultation bilaterally Heart: regular rate and rhythm, S1, S2 normal, no murmur, click, rub or gallop Abdomen: soft, non-tender; bowel sounds normal; no masses,  no organomegaly Extremities: extremities normal, atraumatic, no cyanosis or edema Pulses: 2+ and symmetric Skin:  Skin color, texture, turgor normal. No rashes or lesions Neurologic: Grossly normal psych: Pleasant, but anxious  EKG: Sinus bradycardia with first-degree AV block at 45  ASSESSMENT: 1. CAD status post DES to the proximal LAD for non-STEMI (08/2015) 2. Dyslipidemia 3. Anxiety  PLAN: 1.   Mr. Raatz is doing well post catheterization and PCI to the LAD for a non-STEMI. Fortunately LV function was preserved on his LV gram. Since his procedure he is had no worsening shortness of breath or chest pain. His blood pressure is well controlled but heart rate is actually very low today. He is on metoprolol tartrate 25 mg twice daily. I like to reduce it in half to 12.5 mg twice a day. He is on increased dose lipid medication which she seems to be tolerating on a daily basis. He'll be due for repeat cholesterol profile. He is a good candidate for cardiac rehabilitation and will refer him to [facility] program. They also requested a dietary consultation which I'll place with Estelle Grumbles, RDN.   Plan follow-up in 3 months.  Pixie Casino, MD, [facility] Attending Cardiologist [place] 09/22/2015, 3:46 PM

## 2015-09-24 ENCOUNTER — Telehealth: Payer: Self-pay | Admitting: Internal Medicine

## 2015-09-24 NOTE — Telephone Encounter (Signed)
Orders for cardiac rehab - phase 2

## 2015-09-27 ENCOUNTER — Telehealth: Payer: Self-pay | Admitting: Internal Medicine

## 2015-09-27 NOTE — Telephone Encounter (Signed)
Patient has not heard anything from Rehab in regards to being schedule

## 2015-09-30 ENCOUNTER — Telehealth: Payer: Self-pay | Admitting: Internal Medicine

## 2015-09-30 NOTE — Telephone Encounter (Signed)
Patient called in asking for orders sent to cardiac rehab so he may start, please call cell [phone]

## 2015-10-04 ENCOUNTER — Encounter: Payer: Self-pay | Admitting: Family

## 2015-10-04 ENCOUNTER — Ambulatory Visit (INDEPENDENT_AMBULATORY_CARE_PROVIDER_SITE_OTHER): Payer: PPO | Admitting: Family

## 2015-10-04 VITALS — BP 136/84 | HR 53 | Temp 97.7°F | Resp 16 | Ht 73.0 in | Wt 201.0 lb

## 2015-10-04 DIAGNOSIS — I2510 Atherosclerotic heart disease of native coronary artery without angina pectoris: Secondary | ICD-10-CM | POA: Diagnosis not present

## 2015-10-04 DIAGNOSIS — E785 Hyperlipidemia, unspecified: Secondary | ICD-10-CM | POA: Diagnosis not present

## 2015-10-04 NOTE — Patient Instructions (Addendum)
Thank you for choosing [facility].  Summary/Instructions:  Please continue to take you medications as prescribed.  Follow up with Dr. Debara Pickett as scheduled.  Follow up for an annual physical / exam when due.  If your symptoms worsen or fail to improve, please contact our office for further instruction, or in case of emergency go directly to the emergency room at the closest medical facility.    Cardiac Rehabilitation Cardiac rehabilitation is a medically supervised program that helps improve the health and well-being of people with heart problems. Cardiac rehabilitation includes exercise training, education, and counseling to help you get stronger and return to an active lifestyle. People who participate in cardiac rehabilitation programs get better faster and reduce future hospital stays. Cardiac rehabilitation programs can help when you have had the following conditions:  Heart attack.  Heart failure.  Peripheral artery disease.  Coronary artery disease.  Angina.  Lung or breathing problems. Cardiac rehabilitation programs are also used when you have the following procedures:  Coronary artery bypass graft surgery.  Heart valve replacement.  Heart stent placement.  Heart transplant.  Aneurysm repair. CARDIAC REHABILITATION MAY HELP YOU:  Reduce problems like chest pain and trouble breathing.  Change risk factors that contribute to heart disease, such as:  Smoking.  High blood pressure.  High cholesterol.  Diabetes.  Being out of shape or not active.  Weighing more than 30% over your ideal weight.  Diet.  Improve your mental outlook so you feel:  Less depressed or "blue."  More hopeful.  Better about yourself.  More confident about taking care of yourself.  Get support from health experts as well as other people with similar problems.  Learn how to manage and understand your medicines.  Teach your family about your condition and how to  participate in your recovery. WHAT HAPPENS IN CARDIAC REHABILITATION? You will be assessed by a cardiac rehabilitation team. They will check your health history and do a physical exam. You may need blood tests, stress tests, and other evaluations. You may not start a cardiac rehabilitation program if:  You develop angina with exercise or while at rest.  You have severe heart failure that limits your activity.  You have an abnormal heart rhythm at rest.  You develop heart rhythm problems during exercise.  You have high blood pressure that is not controlled. The cardiac rehabilitation team works with you to make a plan based on your health and goals. Everyone is unique, so each program is customized and your program may change as you progress. Members of a typical cardiac rehabilitation team may include such health professionals as:  Doctors.  Nurses.  Dietitians.  Psychologists.  Exercise specialists.  Physical and occupational therapists. A typical cardiac rehabilitation program is divided into phases. You advance from one phase to the next. Most cardiac rehabilitation sessions last for 60 minutes, 3 times a week.  Phase One starts while you are still in the hospital. You may start by walking in your room and then in the hall. You may start some simple exercises with a therapist. Health care team members will give you information and ask you many questions. You may not be able to remember details, so have a family member or an advocate with you to help keep track of information.  Phase Two begins when you go home or to another facility. This phase may last 8 to 12 weeks. You will travel to a cardiac rehabilitation center or a place where it is offered. Typically,  you gradually increase your activity while being closely watched by a nurse or therapist. Exercises may be a combination of strength or resistance training and "cardio" or aerobic movement on a treadmill or other machines. Your  condition will determine how often and how long these sessions will last.  In phase two, you may learn how to cook healthy meals, control your blood sugar, and manage your medicines. You may need help with scheduling or planning how and when to take your medicines. Use a timer, divided pill box, or follow a form to make taking your medicines easier. Use the method that works best for you. Some medicines should not be taken with certain foods. If you take more than one blood pressure medicine, you may need to stagger the times you take them. Taking all your blood pressure medicine at the same time may lower your blood pressure too much. If you have questions about your medicines, ask your health care provider questions until you understand.  Phase Three continues for the rest of your life. There will be less supervision. You may still participate in cardiac rehabilitation activities or become part of a group in your community. You may benefit from talking to other people about your experience if they are facing similar challenges. How soon you drive, have sex, or return to work will depend on your condition. These decisions should be made by you and your health care provider. If you need help, ask for it. Find out where you can get the help you need. Ask questions until you get answers and understand. SEEK IMMEDIATE MEDICAL CARE IF:  Get medical help at once if you experience any of the following symptoms:  Severe chest discomfort, especially if the pain is crushing or pressure-like and spreads to the arms, back, neck, or jaw. Do not wait to see if the pain will go away.  Weakness or numbness in your face, arms, or legs, especially on one side of the body; slurred speech; confusion; sudden severe headache or loss of vision (all symptoms of stroke).  You have shortness of breath.  You are sweating and feel sick to your stomach (nausea).  You feel dizzy or faint.  You experience profound tiredness  (fatigue). Call your local emergency service (911 in the [place] Do not drive yourself to the hospital.   This information is not intended to replace advice given to you by your health care provider. Make sure you discuss any questions you have with your health care provider.   Document Released: 05/02/2008 Document Revised: 08/14/2014 Document Reviewed: 10/28/2010 Elsevier Interactive Patient Education [employer].

## 2015-10-04 NOTE — Assessment & Plan Note (Signed)
Currently maintained on atorvastatin without myalgias or side effects. Continue to take current dosage of atorvastatin and follow up cholesterol check as scheduled with cardiology.

## 2015-10-04 NOTE — Progress Notes (Signed)
Pre visit review using our clinic review tool, if applicable. No additional management support is needed unless otherwise documented below in the visit note. 

## 2015-10-04 NOTE — Progress Notes (Signed)
Subjective:    Patient ID: Billy [name], male    DOB: [birth year], 75 y.o.   MRN: [id number]  Chief Complaint  Patient presents with  . Establish Care    Recent heart attack at the end of january wanted you to be aware    HPI:  Billy [name] is a 75 y.o. male who  has a past medical history of Plantar fascial fibromatosis; Internal hemorrhoids without mention of complication; Benign neoplasm of colon; Prostatitis; Rib fracture; Finger fracture; colonoscopy; and Myocardial infarction ([place]). and presents today for an office visit to establish care.   1.) Coronary artery disease - Recently traveling and experienced the associated symptom of chest pain that he was brought to [facility] in [place] where he underwent a heart catheterization for a anterior MI. The cath revealed a 90% ruptured plaque in the LAD. Has been seen by cardiology since the episode. Doing well since having the procedure. No chest pain/discomfort, shortness of breath or heart palpitations. Currently maintained on metoprolol, aspirin, and ticagrelor. Reports taking the medication as prescribed and denies adverse side side effects. Awaiting starting cardiac rehabilitation. Other risk factors include dyslipidemia. Previous cardiology office notes reviewed.   2.) Dyslipidemia - Currently maintained on atorvastatin. Reports taking the medication as prescribed and denies adverse side effects or myalgias. Previous risk for CAD in 10 year was 18%.   Lab Results  Component Value Date   CHOL 199 07/27/2015   HDL 48.80 07/27/2015   LDLCALC 133* 07/27/2015   LDLDIRECT 144.0 11/21/2010   TRIG 86.0 07/27/2015   CHOLHDL 4 07/27/2015     Allergies  Allergen Reactions  . Ofloxacin     REACTION: rash     Current Outpatient Prescriptions on File Prior to Visit  Medication Sig Dispense Refill  . aspirin EC 81 MG tablet Take 81 mg by mouth daily.    Marland Kitchen atorvastatin (LIPITOR) 40 MG tablet Take 1 tablet (40 mg total) by  mouth daily. 90 tablet 3  . metoprolol tartrate (LOPRESSOR) 25 MG tablet Take 0.5 tablets (12.5 mg total) by mouth 2 (two) times daily. 90 tablet 3  . Multiple Vitamins-Minerals (MULTIVITAMIN,TX-MINERALS) tablet Take 1 tablet by mouth daily.      . Omega-3 Fatty Acids (FISH OIL) 500 MG CAPS Take 1 capsule by mouth 2 (two) times daily.    . ticagrelor (BRILINTA) 90 MG TABS tablet Take 1 tablet (90 mg total) by mouth 2 (two) times daily. 180 tablet 3   No current facility-administered medications on file prior to visit.    Review of Systems  Constitutional: Negative for fever and chills.  Eyes:       Negative for changes in vision.   Respiratory: Negative for chest tightness and shortness of breath.   Cardiovascular: Negative for chest pain, palpitations and leg swelling.  Neurological: Negative for headaches.      Objective:    BP 136/84 mmHg  Pulse 53  Temp(Src) 97.7 F (36.5 C) (Oral)  Resp 16  Ht 6\' 1"  (1.854 m)  Wt 201 lb (91.173 kg)  BMI 26.52 kg/m2  SpO2 98% Nursing note and vital signs reviewed.  Physical Exam  Constitutional: He is oriented to person, place, and time. He appears well-developed and well-nourished. No distress.  Cardiovascular: Normal rate, regular rhythm, normal heart sounds and intact distal pulses.   Pulmonary/Chest: Effort normal and breath sounds normal.  Neurological: He is alert and oriented to person, place, and time.  Skin: Skin is warm  and dry.  Psychiatric: He has a normal mood and affect. His behavior is normal. Judgment and thought content normal.       Assessment & Plan:   Problem List Items Addressed This Visit      Cardiovascular and Mediastinum   CAD (coronary artery disease) - Primary    Stable with no symptoms currently. Risk factors managed through medication. Calculated risk of CAD was 18% at last note. Will recalculate upon recheck of cholesterol. Continue physical activity and nutrition management. Follow up with cardiology  and progress activity as cleared to do so.         Other   Hyperlipidemia    Currently maintained on atorvastatin without myalgias or side effects. Continue to take current dosage of atorvastatin and follow up cholesterol check as scheduled with cardiology.

## 2015-10-04 NOTE — Assessment & Plan Note (Signed)
Stable with no symptoms currently. Risk factors managed through medication. Calculated risk of CAD was 18% at last note. Will recalculate upon recheck of cholesterol. Continue physical activity and nutrition management. Follow up with cardiology and progress activity as cleared to do so.

## 2015-10-12 ENCOUNTER — Encounter (HOSPITAL_COMMUNITY)
Admission: RE | Admit: 2015-10-12 | Discharge: 2015-10-12 | Disposition: A | Discharge status: Home / Self Care | Payer: PPO | Source: Ambulatory Visit | Attending: Internal Medicine | Admitting: Internal Medicine

## 2015-10-12 VITALS — BP 134/80 | HR 59 | Ht 72.0 in | Wt 200.8 lb

## 2015-10-12 DIAGNOSIS — I214 Non-ST elevation (NSTEMI) myocardial infarction: Secondary | ICD-10-CM | POA: Diagnosis not present

## 2015-10-12 DIAGNOSIS — Z955 Presence of coronary angioplasty implant and graft: Secondary | ICD-10-CM | POA: Insufficient documentation

## 2015-10-12 NOTE — Progress Notes (Signed)
Cardiac Rehab Medication Review by a Pharmacist  Does the patient  feel that his/her medications are working for him/her?  yes  Has the patient been experiencing any side effects to the medications prescribed?  no  Does the patient measure his/her own blood pressure or blood glucose at home?  yes   Does the patient have any problems obtaining medications due to transportation or finances?   no  Understanding of regimen: excellent Understanding of indications: excellent Potential of compliance: excellent    Pharmacist comments: Pt was very familiar with medication regimen. Pt had no medication related questions.  Etha Stambaugh C. Lennox Grumbles, PharmD Pharmacy Resident  Pager: [phone] 10/12/2015 1:31 PM

## 2015-10-12 NOTE — Progress Notes (Signed)
Cardiac Individual Treatment Plan  Patient Details  Name: Billy [name] MRN: [id number] Date of Birth: 01-11-1941 Referring Provider:  Marin Olp, MD  Initial Encounter Date:       CARDIAC REHAB PHASE II ORIENTATION from 10/12/2015 in [place]   Date  10/12/15      Visit Diagnosis: NSTEMI (non-ST elevated myocardial infarction) ([place])  S/P coronary artery stent placement  Patient's Home Medications on Admission:  Current outpatient prescriptions:  .  aspirin EC 81 MG tablet, Take 81 mg by mouth daily., Disp: , Rfl:  .  atorvastatin (LIPITOR) 40 MG tablet, Take 1 tablet (40 mg total) by mouth daily., Disp: 90 tablet, Rfl: 3 .  metoprolol tartrate (LOPRESSOR) 25 MG tablet, Take 0.5 tablets (12.5 mg total) by mouth 2 (two) times daily., Disp: 90 tablet, Rfl: 3 .  Multiple Vitamins-Minerals (MULTIVITAMIN,TX-MINERALS) tablet, Take 1 tablet by mouth daily.  , Disp: , Rfl:  .  Omega-3 Fatty Acids (FISH OIL) 500 MG CAPS, Take 1 capsule by mouth 2 (two) times daily., Disp: , Rfl:  .  ticagrelor (BRILINTA) 90 MG TABS tablet, Take 1 tablet (90 mg total) by mouth 2 (two) times daily., Disp: 180 tablet, Rfl: 3  Past Medical History: Past Medical History  Diagnosis Date  . Plantar fascial fibromatosis   . Internal hemorrhoids without mention of complication   . Benign neoplasm of colon   . Prostatitis   . Rib fracture   . Finger fracture   . Hx of colonoscopy   . Myocardial infarction ([place])     Tobacco Use: History  Smoking status  . Former Smoker -- 1.00 packs/day for 20 years  . Types: Cigarettes  . Quit date: 08/07/1978  Smokeless tobacco  . Never Used    Labs:     Recent Review Flowsheet Data    Labs for ITP Cardiac and Pulmonary Rehab Latest Ref Rng 11/21/2010 05/23/2011 07/24/2012 05/21/2014 07/27/2015   Cholestrol 0 - 200 mg/dL 214(H) 179 198 201(H) 199   LDLCALC 0 - 99 mg/dL - 110(H) 131(H) 136(H) 133(H)   LDLDIRECT - 144.0 - - -  -   HDL >39.00 mg/dL 55.10 55.20 52.20 44.90 48.80   Trlycerides 0.0 - 149.0 mg/dL 71.0 69.0 73.0 102.0 86.0      Capillary Blood Glucose: No results found for: GLUCAP   Exercise Target Goals: Date: 10/12/15  Exercise Program Goal: Individual exercise prescription set with THRR, safety & activity barriers. Participant demonstrates ability to understand and report RPE using BORG scale, to self-measure pulse accurately, and to acknowledge the importance of the exercise prescription.  Exercise Prescription Goal: Starting with aerobic activity 30 plus minutes a day, 3 days per week for initial exercise prescription. Provide home exercise prescription and guidelines that participant acknowledges understanding prior to discharge.  Activity Barriers & Risk Stratification:     Activity Barriers & Cardiac Risk Stratification - 10/12/15 1354    Activity Barriers & Cardiac Risk Stratification   Activity Barriers None   Cardiac Risk Stratification High      6 Minute Walk:     6 Minute Walk      10/12/15 1543       6 Minute Walk   Phase Initial     Distance 1641 feet     Walk Time 1641 minutes     # of Rest Breaks 0     MPH 3.1     METS 3.21     RPE 11  VO2 Peak 11.2     Symptoms No     Resting HR 59 bpm     Resting BP 134/80 mmHg     Max Ex. HR 78 bpm        Initial Exercise Prescription:     Initial Exercise Prescription - 10/12/15 1500    Date of Initial Exercise Prescription   Date 10/12/15   Treadmill   MPH 2.5   Grade 1   Minutes 10   METs 3.2   Bike   Level 1.1   Minutes 10   METs 3.2   NuStep   Level 3   Minutes 10   METs 2   Prescription Details   Frequency (times per week) 3   Duration Progress to 30 minutes of continuous aerobic without signs/symptoms of physical distress   Intensity   THRR 40-80% of Max Heartrate 58-117   Ratings of Perceived Exertion 11-13   Progression   Progression Continue to progress workloads to maintain intensity  without signs/symptoms of physical distress.   Resistance Training   Training Prescription Yes   Weight 2 lbs   Reps 10-12      Perform Capillary Blood Glucose checks as needed.  Exercise Prescription Changes:   Discharge Exercise Prescription (Final Exercise Prescription Changes):   Nutrition:  Target Goals: Understanding of nutrition guidelines, daily intake of sodium 1500mg , cholesterol 200mg , calories 30% from fat and 7% or less from saturated fats, daily to have 5 or more servings of fruits and vegetables.  Biometrics:     Pre Biometrics - 10/12/15 1550    Pre Biometrics   Height 6' (1.829 m)   Weight 200 lb 13.4 oz (91.1 kg)   Waist Circumference 36 inches   Hip Circumference 41 inches   Waist to Hip Ratio 0.88 %   BMI (Calculated) 27.3   Triceps Skinfold 10 mm   % Body Fat 23.6 %   Grip Strength 49 kg   Flexibility 9 in       Nutrition Therapy Plan and Nutrition Goals:   Nutrition Discharge: Rate Your Plate Scores:   Nutrition Goals Re-Evaluation:   Psychosocial: Target Goals: Acknowledge presence or absence of depression, maximize coping skills, provide positive support system. Participant is able to verbalize types and ability to use techniques and skills needed for reducing stress and depression.  Initial Review & Psychosocial Screening:     Initial Psych Review & Screening - 10/12/15 1604    Barriers   Psychosocial barriers to participate in program There are no identifiable barriers or psychosocial needs.      Quality of Life Scores:     Quality of Life - 10/12/15 1554    Quality of Life Scores   Health/Function Pre 30 %   Socioeconomic Pre 30 %   Psych/Spiritual Pre 30 %   Family Pre 30 %   GLOBAL Pre 30 %      PHQ-9:     Recent Review Flowsheet Data    There is no flowsheet data to display.      Psychosocial Evaluation and Intervention:     Psychosocial Evaluation - 10/12/15 1609    Psychosocial Evaluation &  Interventions   Interventions Encouraged to exercise with the program and follow exercise prescription   Continued Psychosocial Services Needed No      Psychosocial Re-Evaluation:   Vocational Rehabilitation: Provide vocational rehab assistance to qualifying candidates.   Vocational Rehab Evaluation & Intervention:   Education: Education Goals: Education classes will be  provided on a weekly basis, covering required topics. Participant will state understanding/return demonstration of topics presented.  Learning Barriers/Preferences:     Learning Barriers/Preferences - 10/12/15 1508    Learning Barriers/Preferences   Learning Barriers Sight   Learning Preferences Audio;Video;Pictoral;Written Material      Education Topics: Count Your Pulse:  -Group instruction provided by verbal instruction, demonstration, patient participation and written materials to support subject.  Instructors address importance of being able to find your pulse and how to count your pulse when at home without a heart monitor.  Patients get hands on experience counting their pulse with staff help and individually.   Heart Attack, Angina, and Risk Factor Modification:  -Group instruction provided by verbal instruction, video, and written materials to support subject.  Instructors address signs and symptoms of angina and heart attacks.    Also discuss risk factors for heart disease and how to make changes to improve heart health risk factors.   Functional Fitness:  -Group instruction provided by verbal instruction, demonstration, patient participation, and written materials to support subject.  Instructors address safety measures for doing things around the house.  Discuss how to get up and down off the floor, how to pick things up properly, how to safely get out of a chair without assistance, and balance training.   Meditation and Mindfulness:  -Group instruction provided by verbal instruction, patient  participation, and written materials to support subject.  Instructor addresses importance of mindfulness and meditation practice to help reduce stress and improve awareness.  Instructor also leads participants through a meditation exercise.    Stretching for Flexibility and Mobility:  -Group instruction provided by verbal instruction, patient participation, and written materials to support subject.  Instructors lead participants through series of stretches that are designed to increase flexibility thus improving mobility.  These stretches are additional exercise for major muscle groups that are typically performed during regular warm up and cool down.   Hands Only CPR Anytime:  -Group instruction provided by verbal instruction, video, patient participation and written materials to support subject.  Instructors co-teach with AHA video for hands only CPR.  Participants get hands on experience with mannequins.   Nutrition I class: Heart Healthy Eating:  -Group instruction provided by PowerPoint slides, verbal discussion, and written materials to support subject matter. The instructor gives an explanation and review of the Therapeutic Lifestyle Changes diet recommendations, which includes a discussion on lipid goals, dietary fat, sodium, fiber, plant stanol/sterol esters, sugar, and the components of a well-balanced, healthy diet.   Nutrition II class: Lifestyle Skills:  -Group instruction provided by PowerPoint slides, verbal discussion, and written materials to support subject matter. The instructor gives an explanation and review of label reading, grocery shopping for heart health, heart healthy recipe modifications, and ways to make healthier choices when eating out.   Diabetes Question & Answer:  -Group instruction provided by PowerPoint slides, verbal discussion, and written materials to support subject matter. The instructor gives an explanation and review of diabetes co-morbidities, pre- and  post-prandial blood glucose goals, pre-exercise blood glucose goals, signs, symptoms, and treatment of hypoglycemia and hyperglycemia, and foot care basics.   Diabetes Blitz:  -Group instruction provided by PowerPoint slides, verbal discussion, and written materials to support subject matter. The instructor gives an explanation and review of the physiology behind type 1 and type 2 diabetes, diabetes medications and rational behind using different medications, pre- and post-prandial blood glucose recommendations and Hemoglobin A1c goals, diabetes diet, and exercise including blood glucose  guidelines for exercising safely.    Portion Distortion:  -Group instruction provided by PowerPoint slides, verbal discussion, written materials, and food models to support subject matter. The instructor gives an explanation of serving size versus portion size, changes in portions sizes over the last 20 years, and what consists of a serving from each food group.   Stress Management:  -Group instruction provided by verbal instruction, video, and written materials to support subject matter.  Instructors review role of stress in heart disease and how to cope with stress positively.     Exercising on Your Own:  -Group instruction provided by verbal instruction, power point, and written materials to support subject.  Instructors discuss benefits of exercise, components of exercise, frequency and intensity of exercise, and end points for exercise.  Also discuss use of nitroglycerin and activating EMS.  Review options of places to exercise outside of rehab.  Review guidelines for sex with heart disease.   Cardiac Drugs I:  -Group instruction provided by verbal instruction and written materials to support subject.  Instructor reviews cardiac drug classes: antiplatelets, anticoagulants, beta blockers, and statins.  Instructor discusses reasons, side effects, and lifestyle considerations for each drug class.   Cardiac  Drugs II:  -Group instruction provided by verbal instruction and written materials to support subject.  Instructor reviews cardiac drug classes: angiotensin converting enzyme inhibitors (ACE-I), angiotensin II receptor blockers (ARBs), nitrates, and calcium channel blockers.  Instructor discusses reasons, side effects, and lifestyle considerations for each drug class.   Anatomy and Physiology of the Circulatory System:  -Group instruction provided by verbal instruction, video, and written materials to support subject.  Reviews functional anatomy of heart, how it relates to various diagnoses, and what role the heart plays in the overall system.   Knowledge Questionnaire Score:     Knowledge Questionnaire Score - 10/12/15 1550    Knowledge Questionnaire Score   Pre Score 17/24      Personal Goals and Risk Factors at Admission:     Personal Goals and Risk Factors at Admission - 10/12/15 1355    Core Components/Risk Factors/Patient Goals on Admission   Improve shortness of breath with ADL's Yes  able to return to normal activities, learn limits   Intervention Provide education, individualized exercise plan and daily activity instruction to help decrease symptoms of SOB with activities of daily living.   Expected Outcomes Short Term: Achieves a reduction of symptoms when performing activities of daily living.   Lipids Yes   Intervention Provide education and support for participant on nutrition & aerobic/resistive exercise along with prescribed medications to achieve LDL 70mg , HDL >40mg .   Expected Outcomes Short Term: Participant states understanding of desired cholesterol values and is compliant with medications prescribed. Participant is following exercise prescription and nutrition guidelines.;Long Term: Cholesterol controlled with medications as prescribed, with individualized exercise RX and with personalized nutrition plan. Value goals: LDL < 70mg , HDL > 40 mg.   Personal Goal Other  Yes   Personal Goal Able to get back to skiing working on house without fear of future events   Intervention Follow exercise prescription   Expected Outcomes Increase stamina and confidence      Personal Goals and Risk Factors Review:    Personal Goals Discharge (Final Personal Goals and Risk Factors Review):    ITP Comments:     ITP Comments      10/12/15 1522           ITP Comments Dr Fransico Him is the medical  director          Comments: Patient attended orientation to review rules and guidelines for program including attendance and infection control policy. Completed 6 minute walk test, Intitial ITP, and exercise prescription.

## 2015-10-18 ENCOUNTER — Encounter (HOSPITAL_COMMUNITY)
Admission: RE | Admit: 2015-10-18 | Discharge: 2015-10-18 | Disposition: A | Discharge status: Home / Self Care | Payer: PPO | Source: Ambulatory Visit | Attending: Internal Medicine | Admitting: Internal Medicine

## 2015-10-18 DIAGNOSIS — I214 Non-ST elevation (NSTEMI) myocardial infarction: Secondary | ICD-10-CM

## 2015-10-18 NOTE — Progress Notes (Signed)
Cardiac Individual Treatment Plan  Patient Details  Name: Billy [name] MRN: [id number] Date of Birth: 02-Dec-1940 Referring Provider:  Marin Olp, MD  Initial Encounter Date:       CARDIAC REHAB PHASE II ORIENTATION from 10/12/2015 in [facility]   Date  10/12/15      Visit Diagnosis: No diagnosis found.  Patient's Home Medications on Admission:  Current outpatient prescriptions:  .  aspirin EC 81 MG tablet, Take 81 mg by mouth daily., Disp: , Rfl:  .  atorvastatin (LIPITOR) 40 MG tablet, Take 1 tablet (40 mg total) by mouth daily., Disp: 90 tablet, Rfl: 3 .  metoprolol tartrate (LOPRESSOR) 25 MG tablet, Take 0.5 tablets (12.5 mg total) by mouth 2 (two) times daily., Disp: 90 tablet, Rfl: 3 .  Multiple Vitamins-Minerals (MULTIVITAMIN,TX-MINERALS) tablet, Take 1 tablet by mouth daily.  , Disp: , Rfl:  .  Omega-3 Fatty Acids (FISH OIL) 500 MG CAPS, Take 1 capsule by mouth 2 (two) times daily., Disp: , Rfl:  .  ticagrelor (BRILINTA) 90 MG TABS tablet, Take 1 tablet (90 mg total) by mouth 2 (two) times daily., Disp: 180 tablet, Rfl: 3  Past Medical History: Past Medical History  Diagnosis Date  . Plantar fascial fibromatosis   . Internal hemorrhoids without mention of complication   . Benign neoplasm of colon   . Prostatitis   . Rib fracture   . Finger fracture   . Hx of colonoscopy   . Myocardial infarction ([place])     Tobacco Use: History  Smoking status  . Former Smoker -- 1.00 packs/day for 20 years  . Types: Cigarettes  . Quit date: 08/07/1978  Smokeless tobacco  . Never Used    Labs: Recent Review Flowsheet Data    Labs for ITP Cardiac and Pulmonary Rehab Latest Ref Rng 11/21/2010 05/23/2011 07/24/2012 05/21/2014 07/27/2015   Cholestrol 0 - 200 mg/dL 214(H) 179 198 201(H) 199   LDLCALC 0 - 99 mg/dL - 110(H) 131(H) 136(H) 133(H)   LDLDIRECT - 144.0 - - - -   HDL >39.00 mg/dL 55.10 55.20 52.20 44.90 48.80   Trlycerides 0.0 -  149.0 mg/dL 71.0 69.0 73.0 102.0 86.0      Capillary Blood Glucose: No results found for: GLUCAP   Exercise Target Goals:    Exercise Program Goal: Individual exercise prescription set with THRR, safety & activity barriers. Participant demonstrates ability to understand and report RPE using BORG scale, to self-measure pulse accurately, and to acknowledge the importance of the exercise prescription.  Exercise Prescription Goal: Starting with aerobic activity 30 plus minutes a day, 3 days per week for initial exercise prescription. Provide home exercise prescription and guidelines that participant acknowledges understanding prior to discharge.  Activity Barriers & Risk Stratification:     Activity Barriers & Cardiac Risk Stratification - 10/12/15 1354    Activity Barriers & Cardiac Risk Stratification   Activity Barriers None   Cardiac Risk Stratification High      6 Minute Walk:     6 Minute Walk      10/12/15 1543       6 Minute Walk   Phase Initial     Distance 1641 feet     Walk Time 1641 minutes     # of Rest Breaks 0     MPH 3.1     METS 3.21     RPE 11     VO2 Peak 11.2     Symptoms No  Resting HR 59 bpm     Resting BP 134/80 mmHg     Max Ex. HR 78 bpm        Initial Exercise Prescription:     Initial Exercise Prescription - 10/12/15 1500    Date of Initial Exercise Prescription   Date 10/12/15   Treadmill   MPH 2.5   Grade 1   Minutes 10   METs 3.2   Bike   Level 1.1   Minutes 10   METs 3.2   NuStep   Level 3   Minutes 10   METs 2   Prescription Details   Frequency (times per week) 3   Duration Progress to 30 minutes of continuous aerobic without signs/symptoms of physical distress   Intensity   THRR 40-80% of Max Heartrate 58-117   Ratings of Perceived Exertion 11-13   Progression   Progression Continue to progress workloads to maintain intensity without signs/symptoms of physical distress.   Resistance Training   Training  Prescription Yes   Weight 2 lbs   Reps 10-12      Perform Capillary Blood Glucose checks as needed.  Exercise Prescription Changes:   Discharge Exercise Prescription (Final Exercise Prescription Changes):   Nutrition:  Target Goals: Understanding of nutrition guidelines, daily intake of sodium 1500mg , cholesterol 200mg , calories 30% from fat and 7% or less from saturated fats, daily to have 5 or more servings of fruits and vegetables.  Biometrics:     Pre Biometrics - 10/12/15 1550    Pre Biometrics   Height 6' (1.829 m)   Weight 200 lb 13.4 oz (91.1 kg)   Waist Circumference 36 inches   Hip Circumference 41 inches   Waist to Hip Ratio 0.88 %   BMI (Calculated) 27.3   Triceps Skinfold 10 mm   % Body Fat 23.6 %   Grip Strength 49 kg   Flexibility 9 in       Nutrition Therapy Plan and Nutrition Goals:     Nutrition Therapy & Goals - 10/13/15 1136    Nutrition Therapy   Diet Therapeutic Lifestyle Changes   Personal Nutrition Goals   Personal Goal #1 Weight Maintenance   Intervention Plan   Intervention Prescribe, educate and counsel regarding individualized specific dietary modifications aiming towards targeted core components such as weight, hypertension, lipid management, diabetes, heart failure and other comorbidities.   Expected Outcomes Short Term Goal: Understand basic principles of dietary content, such as calories, fat, sodium, cholesterol and nutrients.;Long Term Goal: Adherence to prescribed nutrition plan.      Nutrition Discharge: Rate Your Plate Scores:     Nutrition Assessments - 10/13/15 1133    MEDFICTS Scores   Pre Score 24      Nutrition Goals Re-Evaluation:   Psychosocial: Target Goals: Acknowledge presence or absence of depression, maximize coping skills, provide positive support system. Participant is able to verbalize types and ability to use techniques and skills needed for reducing stress and depression.  Initial Review &  Psychosocial Screening:     Initial Psych Review & Screening - 10/12/15 1604    Barriers   Psychosocial barriers to participate in program There are no identifiable barriers or psychosocial needs.      Quality of Life Scores:     Quality of Life - 10/12/15 1554    Quality of Life Scores   Health/Function Pre 30 %   Socioeconomic Pre 30 %   Psych/Spiritual Pre 30 %   Family Pre 30 %  GLOBAL Pre 30 %      PHQ-9:     Recent Review Flowsheet Data    There is no flowsheet data to display.      Psychosocial Evaluation and Intervention:     Psychosocial Evaluation - 10/12/15 1609    Psychosocial Evaluation & Interventions   Interventions Encouraged to exercise with the program and follow exercise prescription   Continued Psychosocial Services Needed No      Psychosocial Re-Evaluation:   Vocational Rehabilitation: Provide vocational rehab assistance to qualifying candidates.   Vocational Rehab Evaluation & Intervention:   Education: Education Goals: Education classes will be provided on a weekly basis, covering required topics. Participant will state understanding/return demonstration of topics presented.  Learning Barriers/Preferences:     Learning Barriers/Preferences - 10/12/15 1508    Learning Barriers/Preferences   Learning Barriers Sight   Learning Preferences Audio;Video;Pictoral;Written Material      Education Topics: Count Your Pulse:  -Group instruction provided by verbal instruction, demonstration, patient participation and written materials to support subject.  Instructors address importance of being able to find your pulse and how to count your pulse when at home without a heart monitor.  Patients get hands on experience counting their pulse with staff help and individually.   Heart Attack, Angina, and Risk Factor Modification:  -Group instruction provided by verbal instruction, video, and written materials to support subject.  Instructors  address signs and symptoms of angina and heart attacks.    Also discuss risk factors for heart disease and how to make changes to improve heart health risk factors.   Functional Fitness:  -Group instruction provided by verbal instruction, demonstration, patient participation, and written materials to support subject.  Instructors address safety measures for doing things around the house.  Discuss how to get up and down off the floor, how to pick things up properly, how to safely get out of a chair without assistance, and balance training.   Meditation and Mindfulness:  -Group instruction provided by verbal instruction, patient participation, and written materials to support subject.  Instructor addresses importance of mindfulness and meditation practice to help reduce stress and improve awareness.  Instructor also leads participants through a meditation exercise.    Stretching for Flexibility and Mobility:  -Group instruction provided by verbal instruction, patient participation, and written materials to support subject.  Instructors lead participants through series of stretches that are designed to increase flexibility thus improving mobility.  These stretches are additional exercise for major muscle groups that are typically performed during regular warm up and cool down.   Hands Only CPR Anytime:  -Group instruction provided by verbal instruction, video, patient participation and written materials to support subject.  Instructors co-teach with AHA video for hands only CPR.  Participants get hands on experience with mannequins.   Nutrition I class: Heart Healthy Eating:  -Group instruction provided by PowerPoint slides, verbal discussion, and written materials to support subject matter. The instructor gives an explanation and review of the Therapeutic Lifestyle Changes diet recommendations, which includes a discussion on lipid goals, dietary fat, sodium, fiber, plant stanol/sterol esters,  sugar, and the components of a well-balanced, healthy diet.   Nutrition II class: Lifestyle Skills:  -Group instruction provided by PowerPoint slides, verbal discussion, and written materials to support subject matter. The instructor gives an explanation and review of label reading, grocery shopping for heart health, heart healthy recipe modifications, and ways to make healthier choices when eating out.   Diabetes Question & Answer:  -Group instruction  provided by PowerPoint slides, verbal discussion, and written materials to support subject matter. The instructor gives an explanation and review of diabetes co-morbidities, pre- and post-prandial blood glucose goals, pre-exercise blood glucose goals, signs, symptoms, and treatment of hypoglycemia and hyperglycemia, and foot care basics.   Diabetes Blitz:  -Group instruction provided by PowerPoint slides, verbal discussion, and written materials to support subject matter. The instructor gives an explanation and review of the physiology behind type 1 and type 2 diabetes, diabetes medications and rational behind using different medications, pre- and post-prandial blood glucose recommendations and Hemoglobin A1c goals, diabetes diet, and exercise including blood glucose guidelines for exercising safely.    Portion Distortion:  -Group instruction provided by PowerPoint slides, verbal discussion, written materials, and food models to support subject matter. The instructor gives an explanation of serving size versus portion size, changes in portions sizes over the last 20 years, and what consists of a serving from each food group.   Stress Management:  -Group instruction provided by verbal instruction, video, and written materials to support subject matter.  Instructors review role of stress in heart disease and how to cope with stress positively.     Exercising on Your Own:  -Group instruction provided by verbal instruction, power point, and written  materials to support subject.  Instructors discuss benefits of exercise, components of exercise, frequency and intensity of exercise, and end points for exercise.  Also discuss use of nitroglycerin and activating EMS.  Review options of places to exercise outside of rehab.  Review guidelines for sex with heart disease.   Cardiac Drugs I:  -Group instruction provided by verbal instruction and written materials to support subject.  Instructor reviews cardiac drug classes: antiplatelets, anticoagulants, beta blockers, and statins.  Instructor discusses reasons, side effects, and lifestyle considerations for each drug class.   Cardiac Drugs II:  -Group instruction provided by verbal instruction and written materials to support subject.  Instructor reviews cardiac drug classes: angiotensin converting enzyme inhibitors (ACE-I), angiotensin II receptor blockers (ARBs), nitrates, and calcium channel blockers.  Instructor discusses reasons, side effects, and lifestyle considerations for each drug class.   Anatomy and Physiology of the Circulatory System:  -Group instruction provided by verbal instruction, video, and written materials to support subject.  Reviews functional anatomy of heart, how it relates to various diagnoses, and what role the heart plays in the overall system.   Knowledge Questionnaire Score:     Knowledge Questionnaire Score - 10/12/15 1550    Knowledge Questionnaire Score   Pre Score 17/24      Personal Goals and Risk Factors at Admission:     Personal Goals and Risk Factors at Admission - 10/12/15 1355    Core Components/Risk Factors/Patient Goals on Admission   Improve shortness of breath with ADL's Yes  able to return to normal activities, learn limits   Intervention Provide education, individualized exercise plan and daily activity instruction to help decrease symptoms of SOB with activities of daily living.   Expected Outcomes Short Term: Achieves a reduction of  symptoms when performing activities of daily living.   Lipids Yes   Intervention Provide education and support for participant on nutrition & aerobic/resistive exercise along with prescribed medications to achieve LDL 70mg , HDL >40mg .   Expected Outcomes Short Term: Participant states understanding of desired cholesterol values and is compliant with medications prescribed. Participant is following exercise prescription and nutrition guidelines.;Long Term: Cholesterol controlled with medications as prescribed, with individualized exercise RX and with personalized nutrition plan.  Value goals: LDL < 70mg , HDL > 40 mg.   Personal Goal Other Yes   Personal Goal Able to get back to skiing working on house without fear of future events   Intervention Follow exercise prescription   Expected Outcomes Increase stamina and confidence      Personal Goals and Risk Factors Review:    Personal Goals Discharge (Final Personal Goals and Risk Factors Review):    ITP Comments:     ITP Comments      10/12/15 1522           ITP Comments Dr Fransico Him is the medical director          Comments: Pt started cardiac rehab today.  Pt tolerated light exercise without difficulty. VSS, telemetry-Sinus Rhythm Sinus brady, asymptomatic.  Medication list reconciled. Pt denies barriers to medicaiton compliance.  PSYCHOSOCIAL ASSESSMENT:  PHQ-0. Pt exhibits positive coping skills, hopeful outlook with supportive family. No psychosocial needs identified at this time, no psychosocial interventions necessary.    Pt enjoys boating and sking.  Pt goals for cardiac rehab are being able to get back to normal activities, learn limits and experience no shortness of breath .  Pt encouraged to participate in exercising on your own to have more success at achieving these goals.   Pt oriented to  exercise equipment and routine.    Understanding verbalized.

## 2015-10-20 ENCOUNTER — Encounter (HOSPITAL_COMMUNITY)
Admission: RE | Admit: 2015-10-20 | Discharge: 2015-10-20 | Disposition: A | Discharge status: Home / Self Care | Payer: PPO | Source: Ambulatory Visit | Attending: Internal Medicine | Admitting: Internal Medicine

## 2015-10-20 DIAGNOSIS — I214 Non-ST elevation (NSTEMI) myocardial infarction: Secondary | ICD-10-CM

## 2015-10-20 DIAGNOSIS — Z955 Presence of coronary angioplasty implant and graft: Secondary | ICD-10-CM

## 2015-10-22 ENCOUNTER — Encounter (HOSPITAL_COMMUNITY): Payer: PPO

## 2015-10-25 ENCOUNTER — Encounter (HOSPITAL_COMMUNITY)
Admission: RE | Admit: 2015-10-25 | Discharge: 2015-10-25 | Disposition: A | Discharge status: Home / Self Care | Payer: PPO | Source: Ambulatory Visit | Attending: Internal Medicine | Admitting: Internal Medicine

## 2015-10-25 DIAGNOSIS — I214 Non-ST elevation (NSTEMI) myocardial infarction: Secondary | ICD-10-CM | POA: Diagnosis not present

## 2015-10-27 ENCOUNTER — Encounter (HOSPITAL_COMMUNITY)
Admission: RE | Admit: 2015-10-27 | Discharge: 2015-10-27 | Disposition: A | Discharge status: Home / Self Care | Payer: PPO | Source: Ambulatory Visit | Attending: Internal Medicine | Admitting: Internal Medicine

## 2015-10-27 DIAGNOSIS — I214 Non-ST elevation (NSTEMI) myocardial infarction: Secondary | ICD-10-CM

## 2015-10-27 DIAGNOSIS — Z955 Presence of coronary angioplasty implant and graft: Secondary | ICD-10-CM

## 2015-10-27 NOTE — Progress Notes (Signed)
Reviewed home exercise with pt today.  Pt plans to walk and bike at home for exercise.  Reviewed THR, pulse, RPE, sign and symptoms, NTG use, and when to call 911 or MD.  Also discussed weather considerations and indoor options.  Pt did not have NTG as a prescription, sent note to MD.  Pt voiced understanding. Alberteen Sam, MA, ACSM RCEP

## 2015-10-28 ENCOUNTER — Other Ambulatory Visit: Payer: Self-pay | Admitting: Internal Medicine

## 2015-10-28 MED ORDER — NITROGLYCERIN 0.4 MG SL SUBL: 0.4000 mg | SUBLINGUAL_TABLET | SUBLINGUAL | Status: DC | PRN

## 2015-10-29 ENCOUNTER — Encounter (HOSPITAL_COMMUNITY)
Admission: RE | Admit: 2015-10-29 | Discharge: 2015-10-29 | Disposition: A | Discharge status: Home / Self Care | Payer: PPO | Source: Ambulatory Visit | Attending: Internal Medicine | Admitting: Internal Medicine

## 2015-10-29 DIAGNOSIS — I214 Non-ST elevation (NSTEMI) myocardial infarction: Secondary | ICD-10-CM

## 2015-10-29 DIAGNOSIS — Z955 Presence of coronary angioplasty implant and graft: Secondary | ICD-10-CM

## 2015-11-01 ENCOUNTER — Encounter (HOSPITAL_COMMUNITY)
Admission: RE | Admit: 2015-11-01 | Discharge: 2015-11-01 | Disposition: A | Discharge status: Home / Self Care | Payer: PPO | Source: Ambulatory Visit | Attending: Internal Medicine | Admitting: Internal Medicine

## 2015-11-01 DIAGNOSIS — I214 Non-ST elevation (NSTEMI) myocardial infarction: Secondary | ICD-10-CM | POA: Diagnosis not present

## 2015-11-01 DIAGNOSIS — Z955 Presence of coronary angioplasty implant and graft: Secondary | ICD-10-CM

## 2015-11-03 ENCOUNTER — Encounter (HOSPITAL_COMMUNITY)
Admission: RE | Admit: 2015-11-03 | Discharge: 2015-11-03 | Disposition: A | Discharge status: Home / Self Care | Payer: PPO | Source: Ambulatory Visit | Attending: Internal Medicine | Admitting: Internal Medicine

## 2015-11-03 DIAGNOSIS — I214 Non-ST elevation (NSTEMI) myocardial infarction: Secondary | ICD-10-CM

## 2015-11-03 DIAGNOSIS — Z955 Presence of coronary angioplasty implant and graft: Secondary | ICD-10-CM

## 2015-11-03 NOTE — Progress Notes (Signed)
Cardiac Individual Treatment Plan  Patient Details  Name: Billy [name] MRN: [id number] Date of Birth: 01-Nov-1940 Referring Provider:  Marin Olp, MD  Initial Encounter Date:       CARDIAC REHAB PHASE II ORIENTATION from 10/12/2015 in [place]   Date  10/12/15      Visit Diagnosis: NSTEMI (non-ST elevated myocardial infarction) ([place])  S/P coronary artery stent placement  Patient's Home Medications on Admission:  Current outpatient prescriptions:  .  aspirin EC 81 MG tablet, Take 81 mg by mouth daily., Disp: , Rfl:  .  atorvastatin (LIPITOR) 40 MG tablet, Take 1 tablet (40 mg total) by mouth daily., Disp: 90 tablet, Rfl: 3 .  metoprolol tartrate (LOPRESSOR) 25 MG tablet, Take 0.5 tablets (12.5 mg total) by mouth 2 (two) times daily., Disp: 90 tablet, Rfl: 3 .  Multiple Vitamins-Minerals (MULTIVITAMIN,TX-MINERALS) tablet, Take 1 tablet by mouth daily.  , Disp: , Rfl:  .  nitroGLYCERIN (NITROSTAT) 0.4 MG SL tablet, Place 1 tablet (0.4 mg total) under the tongue every 5 (five) minutes as needed for chest pain. Max 3 doses., Disp: 25 tablet, Rfl: 3 .  Omega-3 Fatty Acids (FISH OIL) 500 MG CAPS, Take 1 capsule by mouth 2 (two) times daily., Disp: , Rfl:  .  ticagrelor (BRILINTA) 90 MG TABS tablet, Take 1 tablet (90 mg total) by mouth 2 (two) times daily., Disp: 180 tablet, Rfl: 3  Past Medical History: Past Medical History  Diagnosis Date  . Plantar fascial fibromatosis   . Internal hemorrhoids without mention of complication   . Benign neoplasm of colon   . Prostatitis   . Rib fracture   . Finger fracture   . Hx of colonoscopy   . Myocardial infarction ([place])     Tobacco Use: History  Smoking status  . Former Smoker -- 1.00 packs/day for 20 years  . Types: Cigarettes  . Quit date: 08/07/1978  Smokeless tobacco  . Never Used    Labs:     Recent Review Flowsheet Data    Labs for ITP Cardiac and Pulmonary Rehab Latest Ref Rng  11/21/2010 05/23/2011 07/24/2012 05/21/2014 07/27/2015   Cholestrol 0 - 200 mg/dL 214(H) 179 198 201(H) 199   LDLCALC 0 - 99 mg/dL - 110(H) 131(H) 136(H) 133(H)   LDLDIRECT - 144.0 - - - -   HDL >39.00 mg/dL 55.10 55.20 52.20 44.90 48.80   Trlycerides 0.0 - 149.0 mg/dL 71.0 69.0 73.0 102.0 86.0      Capillary Blood Glucose: No results found for: GLUCAP   Exercise Target Goals:    Exercise Program Goal: Individual exercise prescription set with THRR, safety & activity barriers. Participant demonstrates ability to understand and report RPE using BORG scale, to self-measure pulse accurately, and to acknowledge the importance of the exercise prescription.  Exercise Prescription Goal: Starting with aerobic activity 30 plus minutes a day, 3 days per week for initial exercise prescription. Provide home exercise prescription and guidelines that participant acknowledges understanding prior to discharge.  Activity Barriers & Risk Stratification:     Activity Barriers & Cardiac Risk Stratification - 10/12/15 1354    Activity Barriers & Cardiac Risk Stratification   Activity Barriers None   Cardiac Risk Stratification High      6 Minute Walk:     6 Minute Walk      10/12/15 1543       6 Minute Walk   Phase Initial     Distance 1641 feet  Walk Time 1641 minutes     # of Rest Breaks 0     MPH 3.1     METS 3.21     RPE 11     VO2 Peak 11.2     Symptoms No     Resting HR 59 bpm     Resting BP 134/80 mmHg     Max Ex. HR 78 bpm        Initial Exercise Prescription:     Initial Exercise Prescription - 10/12/15 1500    Date of Initial Exercise Prescription   Date 10/12/15   Treadmill   MPH 2.5   Grade 1   Minutes 10   METs 3.2   Bike   Level 1.1   Minutes 10   METs 3.2   NuStep   Level 3   Minutes 10   METs 2   Prescription Details   Frequency (times per week) 3   Duration Progress to 30 minutes of continuous aerobic without signs/symptoms of physical  distress   Intensity   THRR 40-80% of Max Heartrate 58-117   Ratings of Perceived Exertion 11-13   Progression   Progression Continue to progress workloads to maintain intensity without signs/symptoms of physical distress.   Resistance Training   Training Prescription Yes   Weight 2 lbs   Reps 10-12      Perform Capillary Blood Glucose checks as needed.  Exercise Prescription Changes:      Exercise Prescription Changes      10/26/15 1300 10/27/15 1000 11/02/15 1300       Exercise Review   Progression Yes Yes Yes     Response to Exercise   Blood Pressure (Admit) 122/62 mmHg 122/62 mmHg 124/94 mmHg     Blood Pressure (Exercise) 158/86 mmHg 158/86 mmHg 140/84 mmHg     Blood Pressure (Exit) 118/60 mmHg 118/60 mmHg 124/64 mmHg     Heart Rate (Admit) 54 bpm 54 bpm 51 bpm     Heart Rate (Exercise) 70 bpm 70 bpm 85 bpm     Heart Rate (Exit) 51 bpm 51 bpm 54 bpm     Rating of Perceived Exertion (Exercise) 10 10 11      Symptoms none none none     Comments  Reviewed home exercise 10/27/15 Reviewed home exercise 10/27/15     Duration Progress to 30 minutes of continuous aerobic without signs/symptoms of physical distress Progress to 30 minutes of continuous aerobic without signs/symptoms of physical distress Progress to 30 minutes of continuous aerobic without signs/symptoms of physical distress     Intensity THRR unchanged THRR unchanged THRR unchanged     Progression   Progression Continue to progress workloads to maintain intensity without signs/symptoms of physical distress. Continue to progress workloads to maintain intensity without signs/symptoms of physical distress. Continue to progress workloads to maintain intensity without signs/symptoms of physical distress.     Average METs 3.1 3.1 3.5     Resistance Training   Training Prescription Yes Yes Yes     Weight 2 lbs 2 lbs 4 lbs     Reps 10-12 10-12 10-12     Interval Training   Interval Training No No No     Treadmill   MPH  2.5 2.5 2.5     Grade 1 1 1      Minutes 10 10 10      METs 3.26 3.26 3.26     Bike   Level 1.1 1.1 1.5     Minutes 10  10 10     METs 3.33 3.33 4.16     NuStep   Level 3 3 3      Minutes 10 10 10      METs 2.7 2.7 3.2     Home Exercise Plan   Plans to continue exercise at  Home  Walking and Biking Home  Walking and Biking     Frequency  Add 3 additional days to program exercise sessions. Add 3 additional days to program exercise sessions.        Exercise Comments:      Exercise Comments      10/28/15 1433           Exercise Comments Doing well with exercise tolerance, will continue to follow exericse progression          Discharge Exercise Prescription (Final Exercise Prescription Changes):     Exercise Prescription Changes - 11/02/15 1300    Exercise Review   Progression Yes   Response to Exercise   Blood Pressure (Admit) 124/94 mmHg   Blood Pressure (Exercise) 140/84 mmHg   Blood Pressure (Exit) 124/64 mmHg   Heart Rate (Admit) 51 bpm   Heart Rate (Exercise) 85 bpm   Heart Rate (Exit) 54 bpm   Rating of Perceived Exertion (Exercise) 11   Symptoms none   Comments Reviewed home exercise 10/27/15   Duration Progress to 30 minutes of continuous aerobic without signs/symptoms of physical distress   Intensity THRR unchanged   Progression   Progression Continue to progress workloads to maintain intensity without signs/symptoms of physical distress.   Average METs 3.5   Resistance Training   Training Prescription Yes   Weight 4 lbs   Reps 10-12   Interval Training   Interval Training No   Treadmill   MPH 2.5   Grade 1   Minutes 10   METs 3.26   Bike   Level 1.5   Minutes 10   METs 4.16   NuStep   Level 3   Minutes 10   METs 3.2   Home Exercise Plan   Plans to continue exercise at Home  Walking and Biking   Frequency Add 3 additional days to program exercise sessions.      Nutrition:  Target Goals: Understanding of nutrition guidelines, daily  intake of sodium 1500mg , cholesterol 200mg , calories 30% from fat and 7% or less from saturated fats, daily to have 5 or more servings of fruits and vegetables.  Biometrics:     Pre Biometrics - 10/12/15 1550    Pre Biometrics   Height 6' (1.829 m)   Weight 200 lb 13.4 oz (91.1 kg)   Waist Circumference 36 inches   Hip Circumference 41 inches   Waist to Hip Ratio 0.88 %   BMI (Calculated) 27.3   Triceps Skinfold 10 mm   % Body Fat 23.6 %   Grip Strength 49 kg   Flexibility 9 in       Nutrition Therapy Plan and Nutrition Goals:     Nutrition Therapy & Goals - 10/13/15 1136    Nutrition Therapy   Diet Therapeutic Lifestyle Changes   Personal Nutrition Goals   Personal Goal #1 Weight Maintenance   Intervention Plan   Intervention Prescribe, educate and counsel regarding individualized specific dietary modifications aiming towards targeted core components such as weight, hypertension, lipid management, diabetes, heart failure and other comorbidities.   Expected Outcomes Short Term Goal: Understand basic principles of dietary content, such as calories, fat, sodium, cholesterol and  nutrients.;Long Term Goal: Adherence to prescribed nutrition plan.      Nutrition Discharge: Nutrition Scores:     Nutrition Assessments - 10/13/15 1133    MEDFICTS Scores   Pre Score 24      Nutrition Goals Re-Evaluation:   Psychosocial: Target Goals: Acknowledge presence or absence of depression, maximize coping skills, provide positive support system. Participant is able to verbalize types and ability to use techniques and skills needed for reducing stress and depression.  Initial Review & Psychosocial Screening:     Initial Psych Review & Screening - 10/18/15 0945    Family Dynamics   Good Support System? Yes   Screening Interventions   Interventions Encouraged to exercise      Quality of Life Scores:     Quality of Life - 10/12/15 1554    Quality of Life Scores    Health/Function Pre 30 %   Socioeconomic Pre 30 %   Psych/Spiritual Pre 30 %   Family Pre 30 %   GLOBAL Pre 30 %      PHQ-9:     Recent Review Flowsheet Data    Depression screen [facility] 2/9 10/18/2015   Decreased Interest 0   Down, Depressed, Hopeless 0   PHQ - 2 Score 0      Psychosocial Evaluation and Intervention:     Psychosocial Evaluation - 10/12/15 1609    Psychosocial Evaluation & Interventions   Interventions Encouraged to exercise with the program and follow exercise prescription   Continued Psychosocial Services Needed No      Psychosocial Re-Evaluation:     Psychosocial Re-Evaluation      11/03/15 1455           Psychosocial Re-Evaluation   Interventions Encouraged to attend Cardiac Rehabilitation for the exercise       Continued Psychosocial Services Needed No          Vocational Rehabilitation: Provide vocational rehab assistance to qualifying candidates.   Vocational Rehab Evaluation & Intervention:     Vocational Rehab - 10/18/15 1011    Initial Vocational Rehab Evaluation & Intervention   Assessment shows need for Vocational Rehabilitation No      Education: Education Goals: Education classes will be provided on a weekly basis, covering required topics. Participant will state understanding/return demonstration of topics presented.  Learning Barriers/Preferences:     Learning Barriers/Preferences - 10/12/15 1508    Learning Barriers/Preferences   Learning Barriers Sight   Learning Preferences Audio;Video;Pictoral;Written Material      Education Topics: Count Your Pulse:  -Group instruction provided by verbal instruction, demonstration, patient participation and written materials to support subject.  Instructors address importance of being able to find your pulse and how to count your pulse when at home without a heart monitor.  Patients get hands on experience counting their pulse with staff help and individually.   Heart Attack,  Angina, and Risk Factor Modification:  -Group instruction provided by verbal instruction, video, and written materials to support subject.  Instructors address signs and symptoms of angina and heart attacks.    Also discuss risk factors for heart disease and how to make changes to improve heart health risk factors.      CARDIAC REHAB PHASE II EXERCISE from 11/03/2015 in [place]   Date  10/20/15   Instruction Review Code  2- meets goals/outcomes      Functional Fitness:  -Group instruction provided by verbal instruction, demonstration, patient participation, and written materials to support subject.  Instructors address safety measures for doing things around the house.  Discuss how to get up and down off the floor, how to pick things up properly, how to safely get out of a chair without assistance, and balance training.      CARDIAC REHAB PHASE II EXERCISE from 11/03/2015 in [place]   Date  10/29/15   Educator  Hurshel Party, Nelson  ACSM RCEP   Instruction Review Code  2- meets goals/outcomes      Meditation and Mindfulness:  -Group instruction provided by verbal instruction, patient participation, and written materials to support subject.  Instructor addresses importance of mindfulness and meditation practice to help reduce stress and improve awareness.  Instructor also leads participants through a meditation exercise.    Stretching for Flexibility and Mobility:  -Group instruction provided by verbal instruction, patient participation, and written materials to support subject.  Instructors lead participants through series of stretches that are designed to increase flexibility thus improving mobility.  These stretches are additional exercise for major muscle groups that are typically performed during regular warm up and cool down.   Hands Only CPR Anytime:  -Group instruction provided by verbal instruction, video, patient  participation and written materials to support subject.  Instructors co-teach with AHA video for hands only CPR.  Participants get hands on experience with mannequins.   Nutrition I class: Heart Healthy Eating:  -Group instruction provided by PowerPoint slides, verbal discussion, and written materials to support subject matter. The instructor gives an explanation and review of the Therapeutic Lifestyle Changes diet recommendations, which includes a discussion on lipid goals, dietary fat, sodium, fiber, plant stanol/sterol esters, sugar, and the components of a well-balanced, healthy diet.   Nutrition II class: Lifestyle Skills:  -Group instruction provided by PowerPoint slides, verbal discussion, and written materials to support subject matter. The instructor gives an explanation and review of label reading, grocery shopping for heart health, heart healthy recipe modifications, and ways to make healthier choices when eating out.   Diabetes Question & Answer:  -Group instruction provided by PowerPoint slides, verbal discussion, and written materials to support subject matter. The instructor gives an explanation and review of diabetes co-morbidities, pre- and post-prandial blood glucose goals, pre-exercise blood glucose goals, signs, symptoms, and treatment of hypoglycemia and hyperglycemia, and foot care basics.   Diabetes Blitz:  -Group instruction provided by PowerPoint slides, verbal discussion, and written materials to support subject matter. The instructor gives an explanation and review of the physiology behind type 1 and type 2 diabetes, diabetes medications and rational behind using different medications, pre- and post-prandial blood glucose recommendations and Hemoglobin A1c goals, diabetes diet, and exercise including blood glucose guidelines for exercising safely.    Portion Distortion:  -Group instruction provided by PowerPoint slides, verbal discussion, written materials, and food  models to support subject matter. The instructor gives an explanation of serving size versus portion size, changes in portions sizes over the last 20 years, and what consists of a serving from each food group.   Stress Management:  -Group instruction provided by verbal instruction, video, and written materials to support subject matter.  Instructors review role of stress in heart disease and how to cope with stress positively.     Exercising on Your Own:  -Group instruction provided by verbal instruction, power point, and written materials to support subject.  Instructors discuss benefits of exercise, components of exercise, frequency and intensity of exercise, and end points for exercise.  Also discuss use  of nitroglycerin and activating EMS.  Review options of places to exercise outside of rehab.  Review guidelines for sex with heart disease.   Cardiac Drugs I:  -Group instruction provided by verbal instruction and written materials to support subject.  Instructor reviews cardiac drug classes: antiplatelets, anticoagulants, beta blockers, and statins.  Instructor discusses reasons, side effects, and lifestyle considerations for each drug class.   Cardiac Drugs II:  -Group instruction provided by verbal instruction and written materials to support subject.  Instructor reviews cardiac drug classes: angiotensin converting enzyme inhibitors (ACE-I), angiotensin II receptor blockers (ARBs), nitrates, and calcium channel blockers.  Instructor discusses reasons, side effects, and lifestyle considerations for each drug class.      CARDIAC REHAB PHASE II EXERCISE from 11/03/2015 in [place]   Date  10/27/15   Instruction Review Code  2- meets goals/outcomes      Anatomy and Physiology of the Circulatory System:  -Group instruction provided by verbal instruction, video, and written materials to support subject.  Reviews functional anatomy of heart, how it relates to  various diagnoses, and what role the heart plays in the overall system.          CARDIAC REHAB PHASE II EXERCISE from 11/03/2015 in [place]   Date  11/03/15   Instruction Review Code  2- meets goals/outcomes      Knowledge Questionnaire Score:     Knowledge Questionnaire Score - 10/12/15 1550    Knowledge Questionnaire Score   Pre Score 17/24      Core Components/Risk Factors/Patient Goals at Admission:     Personal Goals and Risk Factors at Admission - 10/12/15 1355    Core Components/Risk Factors/Patient Goals on Admission   Improve shortness of breath with ADL's Yes  able to return to normal activities, learn limits   Intervention Provide education, individualized exercise plan and daily activity instruction to help decrease symptoms of SOB with activities of daily living.   Expected Outcomes Short Term: Achieves a reduction of symptoms when performing activities of daily living.   Lipids Yes   Intervention Provide education and support for participant on nutrition & aerobic/resistive exercise along with prescribed medications to achieve LDL 70mg , HDL >40mg .   Expected Outcomes Short Term: Participant states understanding of desired cholesterol values and is compliant with medications prescribed. Participant is following exercise prescription and nutrition guidelines.;Long Term: Cholesterol controlled with medications as prescribed, with individualized exercise RX and with personalized nutrition plan. Value goals: LDL < 70mg , HDL > 40 mg.   Personal Goal Other Yes   Personal Goal Able to get back to skiing working on house without fear of future events   Intervention Follow exercise prescription   Expected Outcomes Increase stamina and confidence      Core Components/Risk Factors/Patient Goals Review:      Goals and Risk Factor Review      10/27/15 1031           Core Components/Risk Factors/Patient Goals Review   Personal Goals  Review Improve shortness of breath with ADL's;Lipids;Other       Review Shortness of breath about the same, encouraged to try taking Birlinta with his coffee to try to help.  No problems with statins.  Feels like he could go skiing tomorrow!       Expected Outcomes Improved SOB and continue to increase functional capacity          Core Components/Risk Factors/Patient Goals at Discharge (Final  Review):      Goals and Risk Factor Review - 10/27/15 1031    Core Components/Risk Factors/Patient Goals Review   Personal Goals Review Improve shortness of breath with ADL's;Lipids;Other   Review Shortness of breath about the same, encouraged to try taking Birlinta with his coffee to try to help.  No problems with statins.  Feels like he could go skiing tomorrow!   Expected Outcomes Improved SOB and continue to increase functional capacity      ITP Comments:     ITP Comments      10/12/15 1522           ITP Comments Dr Fransico Him is the medical director          Comments: 30 DAY ITP REVIEW Pt is making expected progress toward personal goals after completing 8 sessions.   Recommend continued exercise and life style modification education including  stress management and relaxation techniques to decrease cardiac risk profile.

## 2015-11-05 ENCOUNTER — Encounter (HOSPITAL_COMMUNITY)
Admission: RE | Admit: 2015-11-05 | Discharge: 2015-11-05 | Disposition: A | Discharge status: Home / Self Care | Payer: PPO | Source: Ambulatory Visit | Attending: Internal Medicine | Admitting: Internal Medicine

## 2015-11-05 ENCOUNTER — Encounter (HOSPITAL_COMMUNITY): Payer: PPO

## 2015-11-05 DIAGNOSIS — I214 Non-ST elevation (NSTEMI) myocardial infarction: Secondary | ICD-10-CM | POA: Diagnosis not present

## 2015-11-05 NOTE — Progress Notes (Signed)
Billy [name] 75 y.o. male Nutrition Note Spoke with pt.  Nutrition Survey reviewed with pt. Pt is following Step 2 of the Therapeutic Lifestyle Changes diet. Pt expressed understanding of the information reviewed. Pt aware of nutrition education classes offered. No results found for: HGBA1C Wt Readings from Last 3 Encounters:  10/12/15 200 lb 13.4 oz (91.1 kg)  10/04/15 201 lb (91.173 kg)  09/21/15 199 lb (90.266 kg)   Nutrition Diagnosis ? Food-and nutrition-related knowledge deficit related to lack of exposure to information as related to diagnosis of: ? CVD Nutrition Intervention ? Benefits of adopting Therapeutic Lifestyle Changes discussed when Medficts reviewed. ? Pt to attend the Portion Distortion class ? Pt given handouts for: ? Nutrition I class ? Continue client-centered nutrition education by RD, as part of interdisciplinary care.  Goal(s) ? Pt to watch out for sweets and added sugar. ? Pt to describe the benefit of including fruits, vegetables, whole grains, and low-fat dairy products in a heart healthy meal plan.  Monitor and Evaluate progress toward nutrition goal with team.  Derek Mound, M.Ed, RD, LDN, CDE 11/05/2015 10:30 AM

## 2015-11-08 ENCOUNTER — Encounter (HOSPITAL_COMMUNITY): Payer: PPO

## 2015-11-10 ENCOUNTER — Encounter (HOSPITAL_COMMUNITY)
Admission: RE | Admit: 2015-11-10 | Discharge: 2015-11-10 | Disposition: A | Discharge status: Home / Self Care | Payer: PPO | Source: Ambulatory Visit | Attending: Internal Medicine | Admitting: Internal Medicine

## 2015-11-10 ENCOUNTER — Telehealth: Payer: Self-pay | Admitting: Internal Medicine

## 2015-11-10 DIAGNOSIS — Z955 Presence of coronary angioplasty implant and graft: Secondary | ICD-10-CM | POA: Diagnosis not present

## 2015-11-10 DIAGNOSIS — I214 Non-ST elevation (NSTEMI) myocardial infarction: Secondary | ICD-10-CM | POA: Diagnosis not present

## 2015-11-10 NOTE — Telephone Encounter (Signed)
Received call from Verdis Frederickson at [facility] cardiac rehab.Stated patient has been complaining of sob.Also having bradycardia.She will fax EKG strips.Appointment scheduled with Dr.Hilty 11/12/15 at 10:15 am.

## 2015-11-10 NOTE — Telephone Encounter (Signed)
New message    Patient at cardiac rehab now  C/o bradycardia -144 . Strips was sent over - can nurse / MD look at medication to see if meds  can be adjust.

## 2015-11-10 NOTE — Telephone Encounter (Signed)
Returned call to patient.He stated he feels ok except he has been having more sob.Stated has sob with or without exertion.No chest pain.Stated heart rate has been ranging 45,48,53,54,50,52 beats/min.B/P 116/80,118/70,112/70,122/80.Stated he wanted Dr.Hilty to review heart CD before his appointment 11/12/15.Advised I will send message to Dr.Hilty.

## 2015-11-10 NOTE — Progress Notes (Signed)
Patient resting heart rate was noted at 44 this morning at cardiac rehab this morning at cardiac rehab nonsustained. Blood pressure 116/80. Sa02 99% on room air. Patient asymptomatic. Medications reviewed. Today's ECG tracing and exercise flow sheets faxed to [facility] office for review. [name] also mentioned that he sometimes feels short of breath. [name] says he has been experiencing shortness of breath on a daily basis but denies feeling short of breath today. Elly Modena LPN called and  Notified about [name]'s resting bradycardia and complaints of shortness of breath. Malachy Mood made an appointment for [name] to see Dr Debara Pickett on Friday at 10:15. Exit heart rate 53. Blood pressure WNL.

## 2015-11-10 NOTE — Telephone Encounter (Signed)
New Message  Pt requested to speak w/ RN cocnerning upcoming appt w/ Dr Debara Pickett- Please call back and discuss.

## 2015-11-12 ENCOUNTER — Ambulatory Visit (INDEPENDENT_AMBULATORY_CARE_PROVIDER_SITE_OTHER): Payer: PPO | Admitting: Internal Medicine

## 2015-11-12 ENCOUNTER — Encounter (HOSPITAL_COMMUNITY): Payer: PPO

## 2015-11-12 ENCOUNTER — Encounter: Payer: Self-pay | Admitting: Internal Medicine

## 2015-11-12 VITALS — BP 132/85 | HR 50 | Ht 72.0 in | Wt 198.4 lb

## 2015-11-12 DIAGNOSIS — I4589 Other specified conduction disorders: Secondary | ICD-10-CM | POA: Diagnosis not present

## 2015-11-12 DIAGNOSIS — I2510 Atherosclerotic heart disease of native coronary artery without angina pectoris: Secondary | ICD-10-CM

## 2015-11-12 DIAGNOSIS — I2583 Coronary atherosclerosis due to lipid rich plaque: Secondary | ICD-10-CM

## 2015-11-12 DIAGNOSIS — Z955 Presence of coronary angioplasty implant and graft: Secondary | ICD-10-CM

## 2015-11-12 DIAGNOSIS — R0609 Other forms of dyspnea: Secondary | ICD-10-CM

## 2015-11-12 MED ORDER — PRASUGREL HCL 10 MG PO TABS: 10.0000 mg | ORAL_TABLET | Freq: Every day | ORAL | Status: DC

## 2015-11-12 NOTE — Patient Instructions (Addendum)
Your physician has recommended you make the following change in your medication...  1. STOP Brilinta 2. START Effient daily  -- tonight 4/7 you will take 6 tablets (60mg )   -- starting tomorrow 4/8 and from then on you will take just 1 tablet (10mg ) daily in the evening.  3. STOP fish oil 4. STOP metoprolol  Please keep your scheduled follow up appointment   4 bottles of Effient samples provided

## 2015-11-12 NOTE — Telephone Encounter (Signed)
Records received from cardiac rehab. Patient to see MD 4/7 @ 1015am

## 2015-11-12 NOTE — Progress Notes (Signed)
OFFICE NOTE  Chief Complaint:  Follow-up cardiac rehabilitation, shortness of breath with exertion  Primary Care Physician: Mauricio Po, FNP  HPI:  Billy [name] is a pleasant 75 year old male who unfortunately had a recent anterior MI. He was doing some skiing up in the [facility] and had some chest discomfort. This worsened over the period of 1-2 days and then he presented to the emergency department at [facility]. Subsequently he was found to have a small elevation in troponin consistent with non-ST elevation MI. He was started on heparin aspirin and Lipitor and cardiology was consult it. He underwent cardiac catheterization by Dr. Beryle Flock, which demonstrated a 90% ruptured plaque in the LAD that was visualized by either [place]. He underwent placement of a 3.0 mm x 22 mm integrity resolution drug-eluting stent. This reduced the 90% lesion to 0 and TIMI-3 flow was noted. He was placed on aspirin and Brilinta and felt much better after discharge. He was also placed on metoprolol 25 mg twice a day. Today he returns in follow-up. He has a myriad of questions for me. Most have to do with follow-up, medications, and limitations. He denies any chest pain or worsening shortness of breath since his discharge. Blood pressure stable today 126/82 however heart rate is low at 45.  Billy [name] returns today for follow-up. He started cardiac rehabilitation but is noticed that he gets short of breath particularly with exercise. I received telemetry strips from cardiac rehabilitation which showed significant bradycardia with heart rates in the 40s which was a sinus rhythm. He is noted that when he exercises heart rate rarely gets much above the 80s. This indicates a flat heart rate response to exercise and could suggest a degree of chronotropic incompetence. Billy [name] noted that he had a low heart rate most of his life and was placed on low-dose beta blocker after his MI. Initially was  25 mg twice a day which I reduced to 12-1/2 mg twice a day. Heart rate at rest today is 50 which is slightly higher than it had been previously. Blood pressure accordingly is gone up some to 132/85. In addition one is concerned about possible shortness of breath related to Brilinta. He was advised to try some caffeine after taking the medicine but noted no difference in his shortness of breath. He denies any angina.  PMHx:  Past Medical History  Diagnosis Date  . Plantar fascial fibromatosis   . Internal hemorrhoids without mention of complication   . Benign neoplasm of colon   . Prostatitis   . Rib fracture   . Finger fracture   . Hx of colonoscopy   . Myocardial infarction [facility])     Past Surgical History  Procedure Laterality Date  . Polypectomy    . Varicocelectomy      Left  . Tonsillectomy    . Colonoscopy      FAMHx:  Family History  Problem Relation Age of Onset  . Heart disease Mother     ? CAD. died 63  . Stroke Father     at age 57, smoked cigar  . Diabetes Neg Hx   . Prostate cancer Neg Hx   . Colon cancer Neg Hx   . Stomach cancer Neg Hx   . Rectal cancer Neg Hx     SOCHx:   reports that he quit smoking about 37 years ago. His smoking use included Cigarettes. He has a 20 pack-year smoking history. He has never used smokeless  tobacco. He reports that he drinks about 15.0 oz of alcohol per week. He reports that he does not use illicit drugs.  ALLERGIES:  Allergies  Allergen Reactions  . Ofloxacin     REACTION: rash    ROS: Pertinent items noted in HPI and remainder of comprehensive ROS otherwise negative.  HOME MEDS: Current Outpatient Prescriptions  Medication Sig Dispense Refill  . aspirin EC 81 MG tablet Take 81 mg by mouth daily.    Marland Kitchen atorvastatin (LIPITOR) 40 MG tablet Take 1 tablet (40 mg total) by mouth daily. 90 tablet 3  . Multiple Vitamins-Minerals (MULTIVITAMIN,TX-MINERALS) tablet Take 1 tablet by mouth daily.      . nitroGLYCERIN  (NITROSTAT) 0.4 MG SL tablet Place 1 tablet (0.4 mg total) under the tongue every 5 (five) minutes as needed for chest pain. Max 3 doses. 25 tablet 3  . prasugrel (EFFIENT) 10 MG TABS tablet Take 1 tablet (10 mg total) by mouth daily. 90 tablet 3   No current facility-administered medications for this visit.    LABS/IMAGING: No results found for this or any previous visit (from the past 48 hour(s)). No results found.  WEIGHTS: Wt Readings from Last 3 Encounters:  11/12/15 198 lb 6.4 oz (89.994 kg)  10/12/15 200 lb 13.4 oz (91.1 kg)  10/04/15 201 lb (91.173 kg)    VITALS: BP 132/85 mmHg  Pulse 50  Ht 6' (1.829 m)  Wt 198 lb 6.4 oz (89.994 kg)  BMI 26.90 kg/m2  EXAM: General appearance: alert and no distress Lungs: clear to auscultation bilaterally Heart: regular rate and rhythm, S1, S2 normal, no murmur, click, rub or gallop Extremities: extremities normal, atraumatic, no cyanosis or edema Neurologic: Grossly normal  EKG: Deferred  ASSESSMENT: 1. CAD status post DES to the proximal LAD for non-STEMI (08/2015) 2. Dyslipidemia 3. Anxiety 4. Chronotropic incompetence-likely related to beta blocker 5. Dyspnea on exertion  PLAN: 1.   Billy [name] appears to have chronotropic incompetence related to his beta blocker. He says is always had a low heart rate unlikely this is causing his exercise intolerance. I recommend discontinuing his beta blocker. Shortness of breath may also be related to Brilinta. Given his age less than 20 and no history of stroke or intracerebral hemorrhage, he would be a candidate for Effient. I recommend switching him from Brilinta to Effient to complete his one-year course of dual antiplatelet therapy for his drug-eluting stent. We have advised him to take a loading dose of 60 mg tonight and then start on 10 mg daily of Effient in addition to 81 mg of aspirin daily. He should continue on atorvastatin. It would also be beneficial for him to potentially be on  an ACE inhibitor or ARB. We will recheck his blood pressure when he returns in a month and if it remains high normal or likely if he is agreeable to additional medication, I would recommend starting him on a low-dose of an ACE or ARB. There is clear data post MI that this would be helpful. I've encouraged him to continue at cardiac rehabilitation and will monitor his progress.  Follow-up with me in one month.  Pixie Casino, MD, [facility] Attending Cardiologist [place] 11/12/2015, 12:06 PM

## 2015-11-15 ENCOUNTER — Encounter (HOSPITAL_COMMUNITY)
Admission: RE | Admit: 2015-11-15 | Discharge: 2015-11-15 | Disposition: A | Discharge status: Home / Self Care | Payer: PPO | Source: Ambulatory Visit | Attending: Internal Medicine | Admitting: Internal Medicine

## 2015-11-15 DIAGNOSIS — I214 Non-ST elevation (NSTEMI) myocardial infarction: Secondary | ICD-10-CM

## 2015-11-15 DIAGNOSIS — Z955 Presence of coronary angioplasty implant and graft: Secondary | ICD-10-CM

## 2015-11-16 ENCOUNTER — Encounter: Payer: Self-pay | Admitting: Internal Medicine

## 2015-11-17 ENCOUNTER — Other Ambulatory Visit: Payer: Self-pay | Admitting: Internal Medicine

## 2015-11-17 ENCOUNTER — Encounter (HOSPITAL_COMMUNITY)
Admission: RE | Admit: 2015-11-17 | Discharge: 2015-11-17 | Disposition: A | Discharge status: Home / Self Care | Payer: PPO | Source: Ambulatory Visit | Attending: Internal Medicine | Admitting: Internal Medicine

## 2015-11-17 DIAGNOSIS — I214 Non-ST elevation (NSTEMI) myocardial infarction: Secondary | ICD-10-CM | POA: Diagnosis not present

## 2015-11-17 DIAGNOSIS — Z955 Presence of coronary angioplasty implant and graft: Secondary | ICD-10-CM

## 2015-11-17 MED ORDER — PRASUGREL HCL 10 MG PO TABS: 10.0000 mg | ORAL_TABLET | Freq: Every day | ORAL | Status: DC

## 2015-11-17 NOTE — Telephone Encounter (Signed)
°*  STAT* If patient is at the pharmacy, call can be transferred to refill team.   1. Which medications need to be refilled? (please list name of each medication and dose if known) Effient-sent to the wrong RX-please send today  2. Which pharmacy/location (including street and city if local pharmacy) is medication to be sent to?[facility]  3. Do they need a 30 day or 90 day supply? 90 and refills

## 2015-11-17 NOTE — Telephone Encounter (Signed)
Rx(s) sent to pharmacy electronically.  

## 2015-11-19 ENCOUNTER — Encounter (HOSPITAL_COMMUNITY)
Admission: RE | Admit: 2015-11-19 | Discharge: 2015-11-19 | Disposition: A | Discharge status: Home / Self Care | Payer: PPO | Source: Ambulatory Visit | Attending: Internal Medicine | Admitting: Internal Medicine

## 2015-11-19 DIAGNOSIS — I214 Non-ST elevation (NSTEMI) myocardial infarction: Secondary | ICD-10-CM | POA: Diagnosis not present

## 2015-11-22 ENCOUNTER — Encounter (HOSPITAL_COMMUNITY)
Admission: RE | Admit: 2015-11-22 | Discharge: 2015-11-22 | Disposition: A | Discharge status: Home / Self Care | Payer: PPO | Source: Ambulatory Visit | Attending: Internal Medicine | Admitting: Internal Medicine

## 2015-11-22 DIAGNOSIS — I214 Non-ST elevation (NSTEMI) myocardial infarction: Secondary | ICD-10-CM | POA: Diagnosis not present

## 2015-11-24 ENCOUNTER — Encounter (HOSPITAL_COMMUNITY): Payer: PPO

## 2015-11-26 ENCOUNTER — Encounter (HOSPITAL_COMMUNITY): Payer: PPO

## 2015-11-29 ENCOUNTER — Encounter (HOSPITAL_COMMUNITY)
Admission: RE | Admit: 2015-11-29 | Discharge: 2015-11-29 | Disposition: A | Discharge status: Home / Self Care | Payer: PPO | Source: Ambulatory Visit | Attending: Internal Medicine | Admitting: Internal Medicine

## 2015-11-29 DIAGNOSIS — I214 Non-ST elevation (NSTEMI) myocardial infarction: Secondary | ICD-10-CM

## 2015-11-29 DIAGNOSIS — Z955 Presence of coronary angioplasty implant and graft: Secondary | ICD-10-CM

## 2015-12-01 ENCOUNTER — Encounter (HOSPITAL_COMMUNITY)
Admission: RE | Admit: 2015-12-01 | Discharge: 2015-12-01 | Disposition: A | Discharge status: Home / Self Care | Payer: PPO | Source: Ambulatory Visit | Attending: Internal Medicine | Admitting: Internal Medicine

## 2015-12-01 DIAGNOSIS — Z955 Presence of coronary angioplasty implant and graft: Secondary | ICD-10-CM

## 2015-12-01 DIAGNOSIS — I214 Non-ST elevation (NSTEMI) myocardial infarction: Secondary | ICD-10-CM

## 2015-12-01 NOTE — Progress Notes (Signed)
Cardiac Individual Treatment Plan  Patient Details  Name: RUSHI ZINGG MRN: [id number] Date of Birth: 27-May-1941 Referring Provider:        CARDIAC REHAB PHASE II EXERCISE from 11/17/2015 in [facility]   Referring Provider  Hilty,Chad      Initial Encounter Date:       CARDIAC REHAB PHASE II EXERCISE from 11/17/2015 in [place]   Date  10/12/15   Referring Provider  Hilty,Chad      Visit Diagnosis: No diagnosis found.  Patient's Home Medications on Admission:  Current outpatient prescriptions:  .  aspirin EC 81 MG tablet, Take 81 mg by mouth daily., Disp: , Rfl:  .  atorvastatin (LIPITOR) 40 MG tablet, Take 1 tablet (40 mg total) by mouth daily., Disp: 90 tablet, Rfl: 3 .  Multiple Vitamins-Minerals (MULTIVITAMIN,TX-MINERALS) tablet, Take 1 tablet by mouth daily.  , Disp: , Rfl:  .  nitroGLYCERIN (NITROSTAT) 0.4 MG SL tablet, Place 1 tablet (0.4 mg total) under the tongue every 5 (five) minutes as needed for chest pain. Max 3 doses., Disp: 25 tablet, Rfl: 3 .  prasugrel (EFFIENT) 10 MG TABS tablet, Take 1 tablet (10 mg total) by mouth daily., Disp: 90 tablet, Rfl: 3  Past Medical History: Past Medical History  Diagnosis Date  . Plantar fascial fibromatosis   . Internal hemorrhoids without mention of complication   . Benign neoplasm of colon   . Prostatitis   . Rib fracture   . Finger fracture   . Hx of colonoscopy   . Myocardial infarction ([place])     Tobacco Use: History  Smoking status  . Former Smoker -- 1.00 packs/day for 20 years  . Types: Cigarettes  . Quit date: 08/07/1978  Smokeless tobacco  . Never Used    Labs: Recent Review Flowsheet Data    Labs for ITP Cardiac and Pulmonary Rehab Latest Ref Rng 11/21/2010 05/23/2011 07/24/2012 05/21/2014 07/27/2015   Cholestrol 0 - 200 mg/dL 214(H) 179 198 201(H) 199   LDLCALC 0 - 99 mg/dL - 110(H) 131(H) 136(H) 133(H)   LDLDIRECT - 144.0 - - - -   HDL  >39.00 mg/dL 55.10 55.20 52.20 44.90 48.80   Trlycerides 0.0 - 149.0 mg/dL 71.0 69.0 73.0 102.0 86.0      Capillary Blood Glucose: No results found for: GLUCAP   Exercise Target Goals:    Exercise Program Goal: Individual exercise prescription set with THRR, safety & activity barriers. Participant demonstrates ability to understand and report RPE using BORG scale, to self-measure pulse accurately, and to acknowledge the importance of the exercise prescription.  Exercise Prescription Goal: Starting with aerobic activity 30 plus minutes a day, 3 days per week for initial exercise prescription. Provide home exercise prescription and guidelines that participant acknowledges understanding prior to discharge.  Activity Barriers & Risk Stratification:     Activity Barriers & Cardiac Risk Stratification - 10/12/15 1354    Activity Barriers & Cardiac Risk Stratification   Activity Barriers None   Cardiac Risk Stratification High      6 Minute Walk:     6 Minute Walk      10/12/15 1543       6 Minute Walk   Phase Initial     Distance 1641 feet     Walk Time 1641 minutes     # of Rest Breaks 0     MPH 3.1     METS 3.21  RPE 11     VO2 Peak 11.2     Symptoms No     Resting HR 59 bpm     Resting BP 134/80 mmHg     Max Ex. HR 78 bpm        Initial Exercise Prescription:     Initial Exercise Prescription - 11/18/15 1418    Date of Initial Exercise RX and Referring Provider   Date 10/12/15   Referring Provider Hilty,Chad MD   Treadmill   MPH 2.5   Grade 1   Minutes 10   METs 3.26   Bike   Level 1.5   Minutes 10   METs 4.17   NuStep   Level 3   Minutes 10   METs 3.2   Resistance Training   Training Prescription Yes   Weight 4 lbs   Reps 10-12      Perform Capillary Blood Glucose checks as needed.  Exercise Prescription Changes:     Exercise Prescription Changes      10/26/15 1300 10/27/15 1000 11/02/15 1300 11/08/15 1400 11/23/15 1000   Exercise  Review   Progression Yes Yes Yes Yes Yes   Response to Exercise   Blood Pressure (Admit) 122/62 mmHg 122/62 mmHg 124/94 mmHg 118/70 mmHg 114/72 mmHg   Blood Pressure (Exercise) 158/86 mmHg 158/86 mmHg 140/84 mmHg 126/64 mmHg 152/90 mmHg   Blood Pressure (Exit) 118/60 mmHg 118/60 mmHg 124/64 mmHg 104/80 mmHg 118/78 mmHg   Heart Rate (Admit) 54 bpm 54 bpm 51 bpm 54 bpm 50 bpm   Heart Rate (Exercise) 70 bpm 70 bpm 85 bpm 80 bpm 108 bpm   Heart Rate (Exit) 51 bpm 51 bpm 54 bpm 48 bpm 55 bpm   Rating of Perceived Exertion (Exercise) 10 10 11 11 12    Symptoms none none none none none   Comments  Reviewed home exercise 10/27/15 Reviewed home exercise 10/27/15 Reviewed home exercise 10/27/15 Reviewed home exercise 10/27/15   Duration Progress to 30 minutes of continuous aerobic without signs/symptoms of physical distress Progress to 30 minutes of continuous aerobic without signs/symptoms of physical distress Progress to 30 minutes of continuous aerobic without signs/symptoms of physical distress Progress to 30 minutes of continuous aerobic without signs/symptoms of physical distress Progress to 30 minutes of continuous aerobic without signs/symptoms of physical distress   Intensity THRR unchanged THRR unchanged THRR unchanged THRR unchanged THRR unchanged   Progression   Progression Continue to progress workloads to maintain intensity without signs/symptoms of physical distress. Continue to progress workloads to maintain intensity without signs/symptoms of physical distress. Continue to progress workloads to maintain intensity without signs/symptoms of physical distress. Continue to progress workloads to maintain intensity without signs/symptoms of physical distress. Continue to progress workloads to maintain intensity without signs/symptoms of physical distress.   Average METs 3.1 3.1 3.5 3.5 4   Resistance Training   Training Prescription Yes Yes Yes Yes Yes   Weight 2 lbs 2 lbs 4 lbs 4 lbs 4 lbs   Reps  10-12 10-12 10-12 10-12 10-12   Interval Training   Interval Training No No No No No   Treadmill   MPH 2.5 2.5 2.5 2.5 3   Grade 1 1 1 1 2    Minutes 10 10 10 10 10    METs 3.26 3.26 3.26 3.26 4.12   Bike   Level 1.1 1.1 1.5 1.5 1.5   Minutes 10 10 10 10 10    METs 3.33 3.33 4.16 4.17 4.22   NuStep  Level 3 3 3 3 3    Minutes 10 10 10 10 10    METs 2.7 2.7 3.2 3.2 3.8   Home Exercise Plan   Plans to continue exercise at  Home  Walking and Biking Home  Walking and Biking Home  Walking and Biking Home  Walking and Biking   Frequency  Add 3 additional days to program exercise sessions. Add 3 additional days to program exercise sessions. Add 3 additional days to program exercise sessions. Add 3 additional days to program exercise sessions.      Exercise Comments:     Exercise Comments      10/28/15 1433 11/23/15 1036         Exercise Comments Doing well with exercise tolerance, will continue to follow exericse progression Pt is tolerating exercise well and continues to make progress.         Discharge Exercise Prescription (Final Exercise Prescription Changes):     Exercise Prescription Changes - 11/23/15 1000    Exercise Review   Progression Yes   Response to Exercise   Blood Pressure (Admit) 114/72 mmHg   Blood Pressure (Exercise) 152/90 mmHg   Blood Pressure (Exit) 118/78 mmHg   Heart Rate (Admit) 50 bpm   Heart Rate (Exercise) 108 bpm   Heart Rate (Exit) 55 bpm   Rating of Perceived Exertion (Exercise) 12   Symptoms none   Comments Reviewed home exercise 10/27/15   Duration Progress to 30 minutes of continuous aerobic without signs/symptoms of physical distress   Intensity THRR unchanged   Progression   Progression Continue to progress workloads to maintain intensity without signs/symptoms of physical distress.   Average METs 4   Resistance Training   Training Prescription Yes   Weight 4 lbs   Reps 10-12   Interval Training   Interval Training No    Treadmill   MPH 3   Grade 2   Minutes 10   METs 4.12   Bike   Level 1.5   Minutes 10   METs 4.22   NuStep   Level 3   Minutes 10   METs 3.8   Home Exercise Plan   Plans to continue exercise at Home  Walking and Biking   Frequency Add 3 additional days to program exercise sessions.      Nutrition:  Target Goals: Understanding of nutrition guidelines, daily intake of sodium 1500mg , cholesterol 200mg , calories 30% from fat and 7% or less from saturated fats, daily to have 5 or more servings of fruits and vegetables.  Biometrics:     Pre Biometrics - 10/12/15 1550    Pre Biometrics   Height 6' (1.829 m)   Weight 200 lb 13.4 oz (91.1 kg)   Waist Circumference 36 inches   Hip Circumference 41 inches   Waist to Hip Ratio 0.88 %   BMI (Calculated) 27.3   Triceps Skinfold 10 mm   % Body Fat 23.6 %   Grip Strength 49 kg   Flexibility 9 in       Nutrition Therapy Plan and Nutrition Goals:     Nutrition Therapy & Goals - 10/13/15 1136    Nutrition Therapy   Diet Therapeutic Lifestyle Changes   Personal Nutrition Goals   Personal Goal #1 Weight Maintenance   Intervention Plan   Intervention Prescribe, educate and counsel regarding individualized specific dietary modifications aiming towards targeted core components such as weight, hypertension, lipid management, diabetes, heart failure and other comorbidities.   Expected Outcomes Short Term Goal:  Understand basic principles of dietary content, such as calories, fat, sodium, cholesterol and nutrients.;Long Term Goal: Adherence to prescribed nutrition plan.      Nutrition Discharge: Nutrition Scores:     Nutrition Assessments - 11/05/15 1038    MEDFICTS Scores   Pre Score 33  score verified with pt      Nutrition Goals Re-Evaluation:     Nutrition Goals Re-Evaluation      11/05/15 1031           Personal Goal #1 Re-Evaluation   Personal Goal #1 Weight Maintenance       Goal Progress Seen Yes        Comments Pt wt today196.9 lb, which is down 3.9 lb from admission. Pt states wt fluctuates between 195-199 lb. Pt feels wt down due to exercising and not eating as much.        Intervention Plan   Intervention Continue to educate, counsel and set short/long term goals regarding individualized specific personal dietary modifications.          Psychosocial: Target Goals: Acknowledge presence or absence of depression, maximize coping skills, provide positive support system. Participant is able to verbalize types and ability to use techniques and skills needed for reducing stress and depression.  Initial Review & Psychosocial Screening:     Initial Psych Review & Screening - 10/18/15 0945    Family Dynamics   Good Support System? Yes   Screening Interventions   Interventions Encouraged to exercise      Quality of Life Scores:     Quality of Life - 10/12/15 1554    Quality of Life Scores   Health/Function Pre 30 %   Socioeconomic Pre 30 %   Psych/Spiritual Pre 30 %   Family Pre 30 %   GLOBAL Pre 30 %      PHQ-9:     Recent Review Flowsheet Data    Depression screen [facility] 2/9 10/18/2015   Decreased Interest 0   Down, Depressed, Hopeless 0   PHQ - 2 Score 0      Psychosocial Evaluation and Intervention:     Psychosocial Evaluation - 10/12/15 1609    Psychosocial Evaluation & Interventions   Interventions Encouraged to exercise with the program and follow exercise prescription   Continued Psychosocial Services Needed No      Psychosocial Re-Evaluation:     Psychosocial Re-Evaluation      11/03/15 1455           Psychosocial Re-Evaluation   Interventions Encouraged to attend Cardiac Rehabilitation for the exercise       Continued Psychosocial Services Needed No          Vocational Rehabilitation: Provide vocational rehab assistance to qualifying candidates.   Vocational Rehab Evaluation & Intervention:     Vocational Rehab - 10/18/15 1011    Initial  Vocational Rehab Evaluation & Intervention   Assessment shows need for Vocational Rehabilitation No      Education: Education Goals: Education classes will be provided on a weekly basis, covering required topics. Participant will state understanding/return demonstration of topics presented.  Learning Barriers/Preferences:     Learning Barriers/Preferences - 10/12/15 1508    Learning Barriers/Preferences   Learning Barriers Sight   Learning Preferences Audio;Video;Pictoral;Written Material      Education Topics: Count Your Pulse:  -Group instruction provided by verbal instruction, demonstration, patient participation and written materials to support subject.  Instructors address importance of being able to find your pulse and how to count  your pulse when at home without a heart monitor.  Patients get hands on experience counting their pulse with staff help and individually.   Heart Attack, Angina, and Risk Factor Modification:  -Group instruction provided by verbal instruction, video, and written materials to support subject.  Instructors address signs and symptoms of angina and heart attacks.    Also discuss risk factors for heart disease and how to make changes to improve heart health risk factors.          CARDIAC REHAB PHASE II EXERCISE from 11/17/2015 in [place]   Date  10/20/15   Instruction Review Code  2- meets goals/outcomes      Functional Fitness:  -Group instruction provided by verbal instruction, demonstration, patient participation, and written materials to support subject.  Instructors address safety measures for doing things around the house.  Discuss how to get up and down off the floor, how to pick things up properly, how to safely get out of a chair without assistance, and balance training.      CARDIAC REHAB PHASE II EXERCISE from 11/17/2015 in [place]   Date  10/29/15   Educator  Hurshel Party, [place]  ACSM RCEP   Instruction Review Code  2- meets goals/outcomes      Meditation and Mindfulness:  -Group instruction provided by verbal instruction, patient participation, and written materials to support subject.  Instructor addresses importance of mindfulness and meditation practice to help reduce stress and improve awareness.  Instructor also leads participants through a meditation exercise.    Stretching for Flexibility and Mobility:  -Group instruction provided by verbal instruction, patient participation, and written materials to support subject.  Instructors lead participants through series of stretches that are designed to increase flexibility thus improving mobility.  These stretches are additional exercise for major muscle groups that are typically performed during regular warm up and cool down.   Hands Only CPR Anytime:  -Group instruction provided by verbal instruction, video, patient participation and written materials to support subject.  Instructors co-teach with AHA video for hands only CPR.  Participants get hands on experience with mannequins.   Nutrition I class: Heart Healthy Eating:  -Group instruction provided by PowerPoint slides, verbal discussion, and written materials to support subject matter. The instructor gives an explanation and review of the Therapeutic Lifestyle Changes diet recommendations, which includes a discussion on lipid goals, dietary fat, sodium, fiber, plant stanol/sterol esters, sugar, and the components of a well-balanced, healthy diet.      CARDIAC REHAB PHASE II EXERCISE from 11/17/2015 in [place]   Date  11/05/15   Educator  RD   Instruction Review Code  Not applicable [Class handout given for pt to review]      Nutrition II class: Lifestyle Skills:  -Group instruction provided by PowerPoint slides, verbal discussion, and written materials to support subject matter. The instructor gives an  explanation and review of label reading, grocery shopping for heart health, heart healthy recipe modifications, and ways to make healthier choices when eating out.      CARDIAC REHAB PHASE II EXERCISE from 11/17/2015 in [place]   Date  11/05/15   Educator  RD   Instruction Review Code  Not applicable [Class handouts given for pt to review]      Diabetes Question & Answer:  -Group instruction provided by PowerPoint slides, verbal discussion, and written materials to support subject  matter. The instructor gives an explanation and review of diabetes co-morbidities, pre- and post-prandial blood glucose goals, pre-exercise blood glucose goals, signs, symptoms, and treatment of hypoglycemia and hyperglycemia, and foot care basics.   Diabetes Blitz:  -Group instruction provided by PowerPoint slides, verbal discussion, and written materials to support subject matter. The instructor gives an explanation and review of the physiology behind type 1 and type 2 diabetes, diabetes medications and rational behind using different medications, pre- and post-prandial blood glucose recommendations and Hemoglobin A1c goals, diabetes diet, and exercise including blood glucose guidelines for exercising safely.    Portion Distortion:  -Group instruction provided by PowerPoint slides, verbal discussion, written materials, and food models to support subject matter. The instructor gives an explanation of serving size versus portion size, changes in portions sizes over the last 20 years, and what consists of a serving from each food group.   Stress Management:  -Group instruction provided by verbal instruction, video, and written materials to support subject matter.  Instructors review role of stress in heart disease and how to cope with stress positively.     Exercising on Your Own:  -Group instruction provided by verbal instruction, power point, and written materials to support  subject.  Instructors discuss benefits of exercise, components of exercise, frequency and intensity of exercise, and end points for exercise.  Also discuss use of nitroglycerin and activating EMS.  Review options of places to exercise outside of rehab.  Review guidelines for sex with heart disease.      CARDIAC REHAB PHASE II EXERCISE from 11/17/2015 in [place]   Date  11/10/15   Educator  Alberteen Sam   Instruction Review Code  2- meets goals/outcomes      Cardiac Drugs I:  -Group instruction provided by verbal instruction and written materials to support subject.  Instructor reviews cardiac drug classes: antiplatelets, anticoagulants, beta blockers, and statins.  Instructor discusses reasons, side effects, and lifestyle considerations for each drug class.      CARDIAC REHAB PHASE II EXERCISE from 11/17/2015 in [place]   Date  11/17/15   Educator  Pharm D   Instruction Review Code  2- meets goals/outcomes      Cardiac Drugs II:  -Group instruction provided by verbal instruction and written materials to support subject.  Instructor reviews cardiac drug classes: angiotensin converting enzyme inhibitors (ACE-I), angiotensin II receptor blockers (ARBs), nitrates, and calcium channel blockers.  Instructor discusses reasons, side effects, and lifestyle considerations for each drug class.      CARDIAC REHAB PHASE II EXERCISE from 11/17/2015 in [place]   Date  10/27/15   Instruction Review Code  2- meets goals/outcomes      Anatomy and Physiology of the Circulatory System:  -Group instruction provided by verbal instruction, video, and written materials to support subject.  Reviews functional anatomy of heart, how it relates to various diagnoses, and what role the heart plays in the overall system.      CARDIAC REHAB PHASE II EXERCISE from 11/17/2015 in [place]    Date  11/03/15   Instruction Review Code  2- meets goals/outcomes      Knowledge Questionnaire Score:     Knowledge Questionnaire Score - 10/12/15 1550    Knowledge Questionnaire Score   Pre Score 17/24      Core Components/Risk Factors/Patient Goals at Admission:     Personal Goals and Risk Factors  at Admission - 10/12/15 1355    Core Components/Risk Factors/Patient Goals on Admission   Improve shortness of breath with ADL's Yes  able to return to normal activities, learn limits   Intervention Provide education, individualized exercise plan and daily activity instruction to help decrease symptoms of SOB with activities of daily living.   Expected Outcomes Short Term: Achieves a reduction of symptoms when performing activities of daily living.   Lipids Yes   Intervention Provide education and support for participant on nutrition & aerobic/resistive exercise along with prescribed medications to achieve LDL 70mg , HDL >40mg .   Expected Outcomes Short Term: Participant states understanding of desired cholesterol values and is compliant with medications prescribed. Participant is following exercise prescription and nutrition guidelines.;Long Term: Cholesterol controlled with medications as prescribed, with individualized exercise RX and with personalized nutrition plan. Value goals: LDL < 70mg , HDL > 40 mg.   Personal Goal Other Yes   Personal Goal Able to get back to skiing working on house without fear of future events   Intervention Follow exercise prescription   Expected Outcomes Increase stamina and confidence      Core Components/Risk Factors/Patient Goals Review:      Goals and Risk Factor Review      10/27/15 1031           Core Components/Risk Factors/Patient Goals Review   Personal Goals Review Improve shortness of breath with ADL's;Lipids;Other       Review Shortness of breath about the same, encouraged to try taking Birlinta with his coffee to try to help.  No  problems with statins.  Feels like he could go skiing tomorrow!       Expected Outcomes Improved SOB and continue to increase functional capacity          Core Components/Risk Factors/Patient Goals at Discharge (Final Review):      Goals and Risk Factor Review - 10/27/15 1031    Core Components/Risk Factors/Patient Goals Review   Personal Goals Review Improve shortness of breath with ADL's;Lipids;Other   Review Shortness of breath about the same, encouraged to try taking Birlinta with his coffee to try to help.  No problems with statins.  Feels like he could go skiing tomorrow!   Expected Outcomes Improved SOB and continue to increase functional capacity      ITP Comments:     ITP Comments      10/12/15 1522           ITP Comments Dr Fransico Him is the medical director          Comments: *Pt is making expected progress toward personal goals after completing 16 sessions. Recommend continued exercise and life style modification education including  stress management and relaxation techniques to decrease cardiac risk profile. Welby reports feeling much better since his betablocker has been discontinued and his Pattricia Boss has been changed to effiant. Barnet Pall, RN,BSN 12/01/2015 10:34 AM

## 2015-12-03 ENCOUNTER — Encounter (HOSPITAL_COMMUNITY)
Admission: RE | Admit: 2015-12-03 | Discharge: 2015-12-03 | Disposition: A | Discharge status: Home / Self Care | Payer: PPO | Source: Ambulatory Visit | Attending: Internal Medicine | Admitting: Internal Medicine

## 2015-12-03 DIAGNOSIS — I214 Non-ST elevation (NSTEMI) myocardial infarction: Secondary | ICD-10-CM

## 2015-12-03 DIAGNOSIS — Z955 Presence of coronary angioplasty implant and graft: Secondary | ICD-10-CM

## 2015-12-06 ENCOUNTER — Encounter (HOSPITAL_COMMUNITY)
Admission: RE | Admit: 2015-12-06 | Discharge: 2015-12-06 | Disposition: A | Discharge status: Home / Self Care | Payer: PPO | Source: Ambulatory Visit | Attending: Internal Medicine | Admitting: Internal Medicine

## 2015-12-06 DIAGNOSIS — I214 Non-ST elevation (NSTEMI) myocardial infarction: Secondary | ICD-10-CM | POA: Diagnosis not present

## 2015-12-06 DIAGNOSIS — Z955 Presence of coronary angioplasty implant and graft: Secondary | ICD-10-CM | POA: Diagnosis not present

## 2015-12-08 ENCOUNTER — Encounter (HOSPITAL_COMMUNITY)
Admission: RE | Admit: 2015-12-08 | Discharge: 2015-12-08 | Disposition: A | Discharge status: Home / Self Care | Payer: PPO | Source: Ambulatory Visit | Attending: Internal Medicine | Admitting: Internal Medicine

## 2015-12-08 DIAGNOSIS — I214 Non-ST elevation (NSTEMI) myocardial infarction: Secondary | ICD-10-CM | POA: Diagnosis not present

## 2015-12-08 DIAGNOSIS — Z955 Presence of coronary angioplasty implant and graft: Secondary | ICD-10-CM

## 2015-12-09 DIAGNOSIS — Z79899 Other long term (current) drug therapy: Secondary | ICD-10-CM | POA: Diagnosis not present

## 2015-12-09 DIAGNOSIS — E785 Hyperlipidemia, unspecified: Secondary | ICD-10-CM | POA: Diagnosis not present

## 2015-12-10 ENCOUNTER — Encounter (HOSPITAL_COMMUNITY)
Admission: RE | Admit: 2015-12-10 | Discharge: 2015-12-10 | Disposition: A | Discharge status: Home / Self Care | Payer: PPO | Source: Ambulatory Visit | Attending: Internal Medicine | Admitting: Internal Medicine

## 2015-12-10 DIAGNOSIS — I214 Non-ST elevation (NSTEMI) myocardial infarction: Secondary | ICD-10-CM

## 2015-12-10 DIAGNOSIS — Z955 Presence of coronary angioplasty implant and graft: Secondary | ICD-10-CM

## 2015-12-10 LAB — COMPREHENSIVE METABOLIC PANEL
ALBUMIN: 4 g/dL (ref 3.6–5.1)
ALT: 22 U/L (ref 9–46)
AST: 20 U/L (ref 10–35)
Alkaline Phosphatase: 47 U/L (ref 40–115)
BUN: 23 mg/dL (ref 7–25)
CALCIUM: 8.7 mg/dL (ref 8.6–10.3)
CHLORIDE: 106 mmol/L (ref 98–110)
CO2: 23 mmol/L (ref 20–31)
Creat: 0.85 mg/dL (ref 0.70–1.18)
GLUCOSE: 94 mg/dL (ref 65–99)
Potassium: 4.5 mmol/L (ref 3.5–5.3)
Sodium: 136 mmol/L (ref 135–146)
Total Bilirubin: 1 mg/dL (ref 0.2–1.2)
Total Protein: 6.2 g/dL (ref 6.1–8.1)

## 2015-12-13 ENCOUNTER — Encounter (HOSPITAL_COMMUNITY)
Admission: RE | Admit: 2015-12-13 | Discharge: 2015-12-13 | Disposition: A | Discharge status: Home / Self Care | Payer: PPO | Source: Ambulatory Visit | Attending: Internal Medicine | Admitting: Internal Medicine

## 2015-12-13 DIAGNOSIS — I214 Non-ST elevation (NSTEMI) myocardial infarction: Secondary | ICD-10-CM | POA: Diagnosis not present

## 2015-12-13 DIAGNOSIS — Z955 Presence of coronary angioplasty implant and graft: Secondary | ICD-10-CM

## 2015-12-14 LAB — CARDIO IQ(R) ADVANCED LIPID PANEL
Apolipoprotein B: 65 mg/dL (ref 52–109)
CHOLESTEROL, TOTAL (CARDIO IQ ADV LIPID PANEL): 137 mg/dL (ref 125–200)
CHOLESTEROL/HDL RATIO (CARDIO IQ ADV LIPID PANEL): 2.6 calc (ref <=5.0)
HDL CHOLESTEROL (CARDIO IQ ADV LIPID PANEL): 52 mg/dL (ref >=40)
LDL CHOLESTEROL CALCULATED (CARDIO IQ ADV LIPID PANEL): 72 mg/dL
LDL Large: 4336 nmol/L (ref 4334–10815)
LDL Medium: 184 nmol/L (ref 167–465)
LDL PEAK SIZE: 216.8 Angstrom — AB (ref >=218.2)
LDL Particle Number: 884 nmol/L — ABNORMAL LOW (ref 1016–2185)
LDL SMALL: 168 nmol/L (ref 123–441)
Lipoprotein (a): <10 nmol/L (ref <75)
NON-HDL CHOLESTEROL (CARDIO IQ ADV LIPID PANEL): 85 mg/dL
TRIGLYCERIDES (CARDIO IQ ADV LIPID PANEL): 67 mg/dL

## 2015-12-15 ENCOUNTER — Encounter: Payer: Self-pay | Admitting: Internal Medicine

## 2015-12-15 ENCOUNTER — Encounter (HOSPITAL_COMMUNITY): Payer: PPO

## 2015-12-17 ENCOUNTER — Encounter (HOSPITAL_COMMUNITY): Payer: PPO

## 2015-12-20 ENCOUNTER — Ambulatory Visit (INDEPENDENT_AMBULATORY_CARE_PROVIDER_SITE_OTHER): Payer: PPO | Admitting: Internal Medicine

## 2015-12-20 ENCOUNTER — Encounter: Payer: Self-pay | Admitting: Internal Medicine

## 2015-12-20 ENCOUNTER — Encounter (HOSPITAL_COMMUNITY): Payer: PPO

## 2015-12-20 VITALS — BP 126/76 | HR 58 | Ht 72.0 in | Wt 196.4 lb

## 2015-12-20 DIAGNOSIS — Z955 Presence of coronary angioplasty implant and graft: Secondary | ICD-10-CM | POA: Diagnosis not present

## 2015-12-20 DIAGNOSIS — I2510 Atherosclerotic heart disease of native coronary artery without angina pectoris: Secondary | ICD-10-CM

## 2015-12-20 DIAGNOSIS — E785 Hyperlipidemia, unspecified: Secondary | ICD-10-CM

## 2015-12-20 DIAGNOSIS — R0609 Other forms of dyspnea: Secondary | ICD-10-CM

## 2015-12-20 DIAGNOSIS — I2583 Coronary atherosclerosis due to lipid rich plaque: Secondary | ICD-10-CM

## 2015-12-20 NOTE — Patient Instructions (Signed)
Your physician wants you to follow-up in: January/February 2018 with Dr. Debara Pickett. You will receive a reminder letter in the mail two months in advance. If you don't receive a letter, please call our office to schedule the follow-up appointment.

## 2015-12-20 NOTE — Progress Notes (Signed)
OFFICE NOTE  Chief Complaint:  Follow-up cardiac rehabilitation, shortness of breath with exertion  Primary Care Physician: Mauricio Po, FNP  HPI:  Billy [name] is a pleasant 75 year old male who unfortunately had a recent anterior MI. He was doing some skiing up in the [facility] and had some chest discomfort. This worsened over the period of 1-2 days and then he presented to the emergency department at [facility]. Subsequently he was found to have a small elevation in troponin consistent with non-ST elevation MI. He was started on heparin aspirin and Lipitor and cardiology was consult it. He underwent cardiac catheterization by Dr. Beryle Flock, which demonstrated a 90% ruptured plaque in the LAD that was visualized by either [place]. He underwent placement of a 3.0 mm x 22 mm integrity resolution drug-eluting stent. This reduced the 90% lesion to 0 and TIMI-3 flow was noted. He was placed on aspirin and Brilinta and felt much better after discharge. He was also placed on metoprolol 25 mg twice a day. Today he returns in follow-up. He has a myriad of questions for me. Most have to do with follow-up, medications, and limitations. He denies any chest pain or worsening shortness of breath since his discharge. Blood pressure stable today 126/82 however heart rate is low at 45.  Billy [name] returns today for follow-up. He started cardiac rehabilitation but is noticed that he gets short of breath particularly with exercise. I received telemetry strips from cardiac rehabilitation which showed significant bradycardia with heart rates in the 40s which was a sinus rhythm. He is noted that when he exercises heart rate rarely gets much above the 80s. This indicates a flat heart rate response to exercise and could suggest a degree of chronotropic incompetence. Billy [name] noted that he had a low heart rate most of his life and was placed on low-dose beta blocker after his MI. Initially was  25 mg twice a day which I reduced to 12-1/2 mg twice a day. Heart rate at rest today is 50 which is slightly higher than it had been previously. Blood pressure accordingly is gone up some to 132/85. In addition one is concerned about possible shortness of breath related to Brilinta. He was advised to try some caffeine after taking the medicine but noted no difference in his shortness of breath. He denies any angina.  12/10/2015  Billy [name] was seen back in the office today for follow-up. At his last office visit I stopped his Brilinta switched him over to Effient. We also discontinued his beta blocker. He notes a marked improvement of the symptoms in exercise tolerance. Heart rate has correspondingly increased better with exercise. His shortness of breath is totally resolved. Generally feels excellent. Blood pressures been very well controlled even though he is on no blood pressure medications between 675 and 916 systolic. There is not necessarily been enough room to add low-dose ACE inhibitor.  PMHx:  Past Medical History  Diagnosis Date  . Plantar fascial fibromatosis   . Internal hemorrhoids without mention of complication   . Benign neoplasm of colon   . Prostatitis   . Rib fracture   . Finger fracture   . Hx of colonoscopy   . Myocardial infarction [facility])     Past Surgical History  Procedure Laterality Date  . Polypectomy    . Varicocelectomy      Left  . Tonsillectomy    . Colonoscopy      FAMHx:  Family History  Problem Relation  Age of Onset  . Heart disease Mother     ? CAD. died 8  . Stroke Father     at age 57, smoked cigar  . Diabetes Neg Hx   . Prostate cancer Neg Hx   . Colon cancer Neg Hx   . Stomach cancer Neg Hx   . Rectal cancer Neg Hx     SOCHx:   reports that he quit smoking about 37 years ago. His smoking use included Cigarettes. He has a 20 pack-year smoking history. He has never used smokeless tobacco. He reports that he drinks about 15.0 oz of alcohol  per week. He reports that he does not use illicit drugs.  ALLERGIES:  Allergies  Allergen Reactions  . Ofloxacin     REACTION: rash    ROS: Pertinent items noted in HPI and remainder of comprehensive ROS otherwise negative.  HOME MEDS: Current Outpatient Prescriptions  Medication Sig Dispense Refill  . aspirin EC 81 MG tablet Take 81 mg by mouth daily.    Marland Kitchen atorvastatin (LIPITOR) 40 MG tablet Take 1 tablet (40 mg total) by mouth daily. 90 tablet 3  . nitroGLYCERIN (NITROSTAT) 0.4 MG SL tablet Place 1 tablet (0.4 mg total) under the tongue every 5 (five) minutes as needed for chest pain. Max 3 doses. 25 tablet 3  . prasugrel (EFFIENT) 10 MG TABS tablet Take 1 tablet (10 mg total) by mouth daily. 90 tablet 3   No current facility-administered medications for this visit.    LABS/IMAGING: No results found for this or any previous visit (from the past 48 hour(s)). No results found.  WEIGHTS: Wt Readings from Last 3 Encounters:  12/20/15 196 lb 6.4 oz (89.086 kg)  11/12/15 198 lb 6.4 oz (89.994 kg)  10/12/15 200 lb 13.4 oz (91.1 kg)    VITALS: BP 126/76 mmHg  Pulse 58  Ht 6' (1.829 m)  Wt 196 lb 6.4 oz (89.086 kg)  BMI 26.63 kg/m2  EXAM: General appearance: alert and no distress Lungs: clear to auscultation bilaterally Heart: regular rate and rhythm, S1, S2 normal, no murmur, click, rub or gallop Extremities: extremities normal, atraumatic, no cyanosis or edema Neurologic: Grossly normal  EKG: Sinus bradycardia 58  ASSESSMENT: 1. CAD status post DES to the proximal LAD for non-STEMI (08/2015) 2. Dyslipidemia 3. Anxiety 4. Chronotropic incompetence- off beta blocker d/t this 5. Dyspnea on exertion - improved on Effient  PLAN: 1.   Billy [name] has had marked improvement in his symptoms with discontinuing beta blocker and switching Brilinta to Effient. We'll continue with these current medications. He should continue cardiac cream dilatation. I'll plan to see him back  in January which time most likely we can discontinue the Effient completely. He will likely remain on lifelong aspirin and a statin. He may need low-dose ACE inhibitor if blood pressure can tolerate it. His recent laboratory work demonstrates excellent cholesterol control. LDL particle number is 884, total cholesterol 137 and LDL 72, HDL 52 and triglycerides of 67. This represents excellent control.   Pixie Casino, MD, [facility] Attending Cardiologist [place] 12/20/2015, 10:31 AM

## 2015-12-22 ENCOUNTER — Encounter (HOSPITAL_COMMUNITY)
Admission: RE | Admit: 2015-12-22 | Discharge: 2015-12-22 | Disposition: A | Discharge status: Home / Self Care | Payer: PPO | Source: Ambulatory Visit | Attending: Internal Medicine | Admitting: Internal Medicine

## 2015-12-22 DIAGNOSIS — I214 Non-ST elevation (NSTEMI) myocardial infarction: Secondary | ICD-10-CM | POA: Diagnosis not present

## 2015-12-22 DIAGNOSIS — Z955 Presence of coronary angioplasty implant and graft: Secondary | ICD-10-CM

## 2015-12-24 ENCOUNTER — Encounter (HOSPITAL_COMMUNITY)
Admission: RE | Admit: 2015-12-24 | Discharge: 2015-12-24 | Disposition: A | Discharge status: Home / Self Care | Payer: PPO | Source: Ambulatory Visit | Attending: Internal Medicine | Admitting: Internal Medicine

## 2015-12-24 DIAGNOSIS — I214 Non-ST elevation (NSTEMI) myocardial infarction: Secondary | ICD-10-CM

## 2015-12-24 DIAGNOSIS — Z955 Presence of coronary angioplasty implant and graft: Secondary | ICD-10-CM

## 2015-12-27 ENCOUNTER — Encounter (HOSPITAL_COMMUNITY)
Admission: RE | Admit: 2015-12-27 | Discharge: 2015-12-27 | Disposition: A | Discharge status: Home / Self Care | Payer: PPO | Source: Ambulatory Visit | Attending: Internal Medicine | Admitting: Internal Medicine

## 2015-12-27 DIAGNOSIS — Z955 Presence of coronary angioplasty implant and graft: Secondary | ICD-10-CM

## 2015-12-27 DIAGNOSIS — I214 Non-ST elevation (NSTEMI) myocardial infarction: Secondary | ICD-10-CM | POA: Diagnosis not present

## 2015-12-28 NOTE — Progress Notes (Signed)
Cardiac Individual Treatment Plan  Patient Details  Name: Billy [name] MRN: [id number] Date of Birth: 05-11-1941 Referring Provider:        CARDIAC REHAB PHASE II EXERCISE from 11/17/2015 in [facility]   Referring Provider  Hilty,Chad      Initial Encounter Date:       CARDIAC REHAB PHASE II EXERCISE from 11/17/2015 in [facility]   Date  10/12/15   Referring Provider  Hilty,Chad      Visit Diagnosis: NSTEMI (non-ST elevated myocardial infarction) ([place])  S/P coronary artery stent placement  Patient's Home Medications on Admission:  Current outpatient prescriptions:  .  aspirin EC 81 MG tablet, Take 81 mg by mouth daily., Disp: , Rfl:  .  atorvastatin (LIPITOR) 40 MG tablet, Take 1 tablet (40 mg total) by mouth daily., Disp: 90 tablet, Rfl: 3 .  nitroGLYCERIN (NITROSTAT) 0.4 MG SL tablet, Place 1 tablet (0.4 mg total) under the tongue every 5 (five) minutes as needed for chest pain. Max 3 doses., Disp: 25 tablet, Rfl: 3 .  prasugrel (EFFIENT) 10 MG TABS tablet, Take 1 tablet (10 mg total) by mouth daily., Disp: 90 tablet, Rfl: 3  Past Medical History: Past Medical History  Diagnosis Date  . Plantar fascial fibromatosis   . Internal hemorrhoids without mention of complication   . Benign neoplasm of colon   . Prostatitis   . Rib fracture   . Finger fracture   . Hx of colonoscopy   . Myocardial infarction ([place])     Tobacco Use: History  Smoking status  . Former Smoker -- 1.00 packs/day for 20 years  . Types: Cigarettes  . Quit date: 08/07/1978  Smokeless tobacco  . Never Used    Labs: Recent Review Flowsheet Data    Labs for ITP Cardiac and Pulmonary Rehab Latest Ref Rng 05/23/2011 07/24/2012 05/21/2014 07/27/2015 12/09/2015   Cholestrol 125 - 200 mg/dL 179 198 201(H) 199 137   LDLCALC - 110(H) 131(H) 136(H) 133(H) 72   HDL >=40 mg/dL 55.20 52.20 44.90 48.80 52   Trlycerides - 69.0 73.0 102.0 86.0 67       Capillary Blood Glucose: No results found for: GLUCAP   Exercise Target Goals:    Exercise Program Goal: Individual exercise prescription set with THRR, safety & activity barriers. Participant demonstrates ability to understand and report RPE using BORG scale, to self-measure pulse accurately, and to acknowledge the importance of the exercise prescription.  Exercise Prescription Goal: Starting with aerobic activity 30 plus minutes a day, 3 days per week for initial exercise prescription. Provide home exercise prescription and guidelines that participant acknowledges understanding prior to discharge.  Activity Barriers & Risk Stratification:     Activity Barriers & Cardiac Risk Stratification - 10/12/15 1354    Activity Barriers & Cardiac Risk Stratification   Activity Barriers None   Cardiac Risk Stratification High      6 Minute Walk:     6 Minute Walk      10/12/15 1543       6 Minute Walk   Phase Initial     Distance 1641 feet     Walk Time 1641 minutes     # of Rest Breaks 0     MPH 3.1     METS 3.21     RPE 11     VO2 Peak 11.2     Symptoms No     Resting HR 59 bpm  Resting BP 134/80 mmHg     Max Ex. HR 78 bpm        Initial Exercise Prescription:     Initial Exercise Prescription - 11/18/15 1418    Date of Initial Exercise RX and Referring Provider   Date 10/12/15   Referring Provider Hilty,Chad MD   Treadmill   MPH 2.5   Grade 1   Minutes 10   METs 3.26   Bike   Level 1.5   Minutes 10   METs 4.17   NuStep   Level 3   Minutes 10   METs 3.2   Resistance Training   Training Prescription Yes   Weight 4 lbs   Reps 10-12      Perform Capillary Blood Glucose checks as needed.  Exercise Prescription Changes:     Exercise Prescription Changes      10/26/15 1300 10/27/15 1000 11/02/15 1300 11/08/15 1400 11/23/15 1000   Exercise Review   Progression Yes Yes Yes Yes Yes   Response to Exercise   Blood Pressure (Admit) 122/62 mmHg  122/62 mmHg 124/94 mmHg 118/70 mmHg 114/72 mmHg   Blood Pressure (Exercise) 158/86 mmHg 158/86 mmHg 140/84 mmHg 126/64 mmHg 152/90 mmHg   Blood Pressure (Exit) 118/60 mmHg 118/60 mmHg 124/64 mmHg 104/80 mmHg 118/78 mmHg   Heart Rate (Admit) 54 bpm 54 bpm 51 bpm 54 bpm 50 bpm   Heart Rate (Exercise) 70 bpm 70 bpm 85 bpm 80 bpm 108 bpm   Heart Rate (Exit) 51 bpm 51 bpm 54 bpm 48 bpm 55 bpm   Rating of Perceived Exertion (Exercise) 10 10 11 11 12    Symptoms none none none none none   Comments  Reviewed home exercise 10/27/15 Reviewed home exercise 10/27/15 Reviewed home exercise 10/27/15 Reviewed home exercise 10/27/15   Duration Progress to 30 minutes of continuous aerobic without signs/symptoms of physical distress Progress to 30 minutes of continuous aerobic without signs/symptoms of physical distress Progress to 30 minutes of continuous aerobic without signs/symptoms of physical distress Progress to 30 minutes of continuous aerobic without signs/symptoms of physical distress Progress to 30 minutes of continuous aerobic without signs/symptoms of physical distress   Intensity THRR unchanged THRR unchanged THRR unchanged THRR unchanged THRR unchanged   Progression   Progression Continue to progress workloads to maintain intensity without signs/symptoms of physical distress. Continue to progress workloads to maintain intensity without signs/symptoms of physical distress. Continue to progress workloads to maintain intensity without signs/symptoms of physical distress. Continue to progress workloads to maintain intensity without signs/symptoms of physical distress. Continue to progress workloads to maintain intensity without signs/symptoms of physical distress.   Average METs 3.1 3.1 3.5 3.5 4   Resistance Training   Training Prescription Yes Yes Yes Yes Yes   Weight 2 lbs 2 lbs 4 lbs 4 lbs 4 lbs   Reps 10-12 10-12 10-12 10-12 10-12   Interval Training   Interval Training No No No No No   Treadmill   MPH  2.5 2.5 2.5 2.5 3   Grade 1 1 1 1 2    Minutes 10 10 10 10 10    METs 3.26 3.26 3.26 3.26 4.12   Bike   Level 1.1 1.1 1.5 1.5 1.5   Minutes 10 10 10 10 10    METs 3.33 3.33 4.16 4.17 4.22   NuStep   Level 3 3 3 3 3    Minutes 10 10 10 10 10    METs 2.7 2.7 3.2 3.2 3.8   Home Exercise Plan  Plans to continue exercise at  Home  Walking and Biking Home  Walking and Biking Home  Walking and Biking Home  Walking and Biking   Frequency  Add 3 additional days to program exercise sessions. Add 3 additional days to program exercise sessions. Add 3 additional days to program exercise sessions. Add 3 additional days to program exercise sessions.     12/07/15 0900 12/22/15 1600         Exercise Review   Progression Yes Yes      Response to Exercise   Blood Pressure (Admit) 118/64 mmHg 118/70 mmHg      Blood Pressure (Exercise) 158/84 mmHg 132/68 mmHg      Blood Pressure (Exit) 120/64 mmHg 106/62 mmHg      Heart Rate (Admit) 62 bpm 58 bpm      Heart Rate (Exercise) 105 bpm 104 bpm      Heart Rate (Exit) 62 bpm 66 bpm      Rating of Perceived Exertion (Exercise) 11 11      Symptoms none none      Comments Reviewed home exercise 10/27/15 Reviewed home exercise 10/27/15      Duration Progress to 30 minutes of continuous aerobic without signs/symptoms of physical distress Progress to 30 minutes of continuous aerobic without signs/symptoms of physical distress      Intensity THRR unchanged THRR unchanged      Progression   Progression Continue to progress workloads to maintain intensity without signs/symptoms of physical distress. Continue to progress workloads to maintain intensity without signs/symptoms of physical distress.      Average METs 4.2 4.3      Resistance Training   Training Prescription Yes Yes      Weight 4 lbs 4 lbs      Reps 10-12 10-12      Interval Training   Interval Training No No      Treadmill   MPH 3 3.2      Grade 3 4      Minutes 10 10      METs 4.54 5.21       Bike   Level 1.5 1.5      Minutes 10 10      METs 4.23 4.22      NuStep   Level 5 --      Minutes 10 --      METs 3.9 --      Elliptical   Level  1      Minutes  10      METs  4      Home Exercise Plan   Plans to continue exercise at Home  Walking and Biking Home  Walking and Biking      Frequency Add 3 additional days to program exercise sessions. Add 3 additional days to program exercise sessions.         Exercise Comments:     Exercise Comments      10/28/15 1433 11/23/15 1036 12/07/15 0916 12/22/15 1638     Exercise Comments Doing well with exercise tolerance, will continue to follow exericse progression Pt is tolerating exercise well and continues to make progress. Pt continues to make good progress in exercise. Pt returned to exercise at gym yesterday and is walking 2-46miles daily. Pt is no longer having SOB.       Discharge Exercise Prescription (Final Exercise Prescription Changes):     Exercise Prescription Changes - 12/22/15 1600    Exercise Review   Progression Yes  Response to Exercise   Blood Pressure (Admit) 118/70 mmHg   Blood Pressure (Exercise) 132/68 mmHg   Blood Pressure (Exit) 106/62 mmHg   Heart Rate (Admit) 58 bpm   Heart Rate (Exercise) 104 bpm   Heart Rate (Exit) 66 bpm   Rating of Perceived Exertion (Exercise) 11   Symptoms none   Comments Reviewed home exercise 10/27/15   Duration Progress to 30 minutes of continuous aerobic without signs/symptoms of physical distress   Intensity THRR unchanged   Progression   Progression Continue to progress workloads to maintain intensity without signs/symptoms of physical distress.   Average METs 4.3   Resistance Training   Training Prescription Yes   Weight 4 lbs   Reps 10-12   Interval Training   Interval Training No   Treadmill   MPH 3.2   Grade 4   Minutes 10   METs 5.21   Bike   Level 1.5   Minutes 10   METs 4.22   NuStep   Level --   Minutes --   METs --   Elliptical   Level 1    Minutes 10   METs 4   Home Exercise Plan   Plans to continue exercise at Home  Walking and Biking   Frequency Add 3 additional days to program exercise sessions.      Nutrition:  Target Goals: Understanding of nutrition guidelines, daily intake of sodium 1500mg , cholesterol 200mg , calories 30% from fat and 7% or less from saturated fats, daily to have 5 or more servings of fruits and vegetables.  Biometrics:     Pre Biometrics - 10/12/15 1550    Pre Biometrics   Height 6' (1.829 m)   Weight 200 lb 13.4 oz (91.1 kg)   Waist Circumference 36 inches   Hip Circumference 41 inches   Waist to Hip Ratio 0.88 %   BMI (Calculated) 27.3   Triceps Skinfold 10 mm   % Body Fat 23.6 %   Grip Strength 49 kg   Flexibility 9 in       Nutrition Therapy Plan and Nutrition Goals:     Nutrition Therapy & Goals - 10/13/15 1136    Nutrition Therapy   Diet Therapeutic Lifestyle Changes   Personal Nutrition Goals   Personal Goal #1 Weight Maintenance   Intervention Plan   Intervention Prescribe, educate and counsel regarding individualized specific dietary modifications aiming towards targeted core components such as weight, hypertension, lipid management, diabetes, heart failure and other comorbidities.   Expected Outcomes Short Term Goal: Understand basic principles of dietary content, such as calories, fat, sodium, cholesterol and nutrients.;Long Term Goal: Adherence to prescribed nutrition plan.      Nutrition Discharge: Nutrition Scores:     Nutrition Assessments - 11/05/15 1038    MEDFICTS Scores   Pre Score 33  score verified with pt      Nutrition Goals Re-Evaluation:     Nutrition Goals Re-Evaluation      11/05/15 1031           Personal Goal #1 Re-Evaluation   Personal Goal #1 Weight Maintenance       Goal Progress Seen Yes       Comments Pt wt today196.9 lb, which is down 3.9 lb from admission. Pt states wt fluctuates between 195-199 lb. Pt feels wt down due  to exercising and not eating as much.        Intervention Plan   Intervention Continue to educate, counsel and set short/long term  goals regarding individualized specific personal dietary modifications.          Psychosocial: Target Goals: Acknowledge presence or absence of depression, maximize coping skills, provide positive support system. Participant is able to verbalize types and ability to use techniques and skills needed for reducing stress and depression.  Initial Review & Psychosocial Screening:     Initial Psych Review & Screening - 10/18/15 0945    Family Dynamics   Good Support System? Yes   Screening Interventions   Interventions Encouraged to exercise      Quality of Life Scores:     Quality of Life - 10/12/15 1554    Quality of Life Scores   Health/Function Pre 30 %   Socioeconomic Pre 30 %   Psych/Spiritual Pre 30 %   Family Pre 30 %   GLOBAL Pre 30 %      PHQ-9:     Recent Review Flowsheet Data    Depression screen [facility] 2/9 10/18/2015   Decreased Interest 0   Down, Depressed, Hopeless 0   PHQ - 2 Score 0      Psychosocial Evaluation and Intervention:     Psychosocial Evaluation - 10/12/15 1609    Psychosocial Evaluation & Interventions   Interventions Encouraged to exercise with the program and follow exercise prescription   Continued Psychosocial Services Needed No      Psychosocial Re-Evaluation:     Psychosocial Re-Evaluation      11/03/15 1455 12/01/15 1035 12/28/15 1014       Psychosocial Re-Evaluation   Interventions Encouraged to attend Cardiac Rehabilitation for the exercise Encouraged to attend Cardiac Rehabilitation for the exercise Encouraged to attend Cardiac Rehabilitation for the exercise     Continued Psychosocial Services Needed No No No        Vocational Rehabilitation: Provide vocational rehab assistance to qualifying candidates.   Vocational Rehab Evaluation & Intervention:     Vocational Rehab - 10/18/15 1011     Initial Vocational Rehab Evaluation & Intervention   Assessment shows need for Vocational Rehabilitation No      Education: Education Goals: Education classes will be provided on a weekly basis, covering required topics. Participant will state understanding/return demonstration of topics presented.  Learning Barriers/Preferences:     Learning Barriers/Preferences - 10/12/15 1508    Learning Barriers/Preferences   Learning Barriers Sight   Learning Preferences Audio;Video;Pictoral;Written Material      Education Topics: Count Your Pulse:  -Group instruction provided by verbal instruction, demonstration, patient participation and written materials to support subject.  Instructors address importance of being able to find your pulse and how to count your pulse when at home without a heart monitor.  Patients get hands on experience counting their pulse with staff help and individually.          CARDIAC REHAB PHASE II EXERCISE from 12/22/2015 in [facility]   Date  12/10/15   Educator  Andi Hence   Instruction Review Code  2- meets goals/outcomes      Heart Attack, Angina, and Risk Factor Modification:  -Group instruction provided by verbal instruction, video, and written materials to support subject.  Instructors address signs and symptoms of angina and heart attacks.    Also discuss risk factors for heart disease and how to make changes to improve heart health risk factors.      CARDIAC REHAB PHASE II EXERCISE from 12/22/2015 in [facility]   Date  10/20/15   Instruction  Review Code  2- meets goals/outcomes      Functional Fitness:  -Group instruction provided by verbal instruction, demonstration, patient participation, and written materials to support subject.  Instructors address safety measures for doing things around the house.  Discuss how to get up and down off the floor, how to pick things up properly, how to  safely get out of a chair without assistance, and balance training.      CARDIAC REHAB PHASE II EXERCISE from 12/22/2015 in [facility]   Date  10/29/15   Educator  Hurshel Party, [place]  ACSM RCEP   Instruction Review Code  2- meets goals/outcomes      Meditation and Mindfulness:  -Group instruction provided by verbal instruction, patient participation, and written materials to support subject.  Instructor addresses importance of mindfulness and meditation practice to help reduce stress and improve awareness.  Instructor also leads participants through a meditation exercise.    Stretching for Flexibility and Mobility:  -Group instruction provided by verbal instruction, patient participation, and written materials to support subject.  Instructors lead participants through series of stretches that are designed to increase flexibility thus improving mobility.  These stretches are additional exercise for major muscle groups that are typically performed during regular warm up and cool down.   Hands Only CPR Anytime:  -Group instruction provided by verbal instruction, video, patient participation and written materials to support subject.  Instructors co-teach with AHA video for hands only CPR.  Participants get hands on experience with mannequins.   Nutrition I class: Heart Healthy Eating:  -Group instruction provided by PowerPoint slides, verbal discussion, and written materials to support subject matter. The instructor gives an explanation and review of the Therapeutic Lifestyle Changes diet recommendations, which includes a discussion on lipid goals, dietary fat, sodium, fiber, plant stanol/sterol esters, sugar, and the components of a well-balanced, healthy diet.      CARDIAC REHAB PHASE II EXERCISE from 12/22/2015 in [facility]   Date  11/05/15   Educator  RD   Instruction Review Code  Not applicable [Class handout given for pt to  review]      Nutrition II class: Lifestyle Skills:  -Group instruction provided by PowerPoint slides, verbal discussion, and written materials to support subject matter. The instructor gives an explanation and review of label reading, grocery shopping for heart health, heart healthy recipe modifications, and ways to make healthier choices when eating out.      CARDIAC REHAB PHASE II EXERCISE from 12/22/2015 in [facility]   Date  11/05/15   Educator  RD   Instruction Review Code  Not applicable [Class handouts given for pt to review]      Diabetes Question & Answer:  -Group instruction provided by PowerPoint slides, verbal discussion, and written materials to support subject matter. The instructor gives an explanation and review of diabetes co-morbidities, pre- and post-prandial blood glucose goals, pre-exercise blood glucose goals, signs, symptoms, and treatment of hypoglycemia and hyperglycemia, and foot care basics.   Diabetes Blitz:  -Group instruction provided by PowerPoint slides, verbal discussion, and written materials to support subject matter. The instructor gives an explanation and review of the physiology behind type 1 and type 2 diabetes, diabetes medications and rational behind using different medications, pre- and post-prandial blood glucose recommendations and Hemoglobin A1c goals, diabetes diet, and exercise including blood glucose guidelines for exercising safely.    Portion Distortion:  -Group instruction provided by  PowerPoint slides, verbal discussion, written materials, and food models to support subject matter. The instructor gives an explanation of serving size versus portion size, changes in portions sizes over the last 20 years, and what consists of a serving from each food group.      CARDIAC REHAB PHASE II EXERCISE from 12/22/2015 in [facility]   Date  12/01/15   Educator  RD   Instruction Review Code   2- meets goals/outcomes      Stress Management:  -Group instruction provided by verbal instruction, video, and written materials to support subject matter.  Instructors review role of stress in heart disease and how to cope with stress positively.     Exercising on Your Own:  -Group instruction provided by verbal instruction, power point, and written materials to support subject.  Instructors discuss benefits of exercise, components of exercise, frequency and intensity of exercise, and end points for exercise.  Also discuss use of nitroglycerin and activating EMS.  Review options of places to exercise outside of rehab.  Review guidelines for sex with heart disease.      CARDIAC REHAB PHASE II EXERCISE from 12/22/2015 in [facility]   Date  11/10/15   Educator  Alberteen Sam   Instruction Review Code  2- meets goals/outcomes      Cardiac Drugs I:  -Group instruction provided by verbal instruction and written materials to support subject.  Instructor reviews cardiac drug classes: antiplatelets, anticoagulants, beta blockers, and statins.  Instructor discusses reasons, side effects, and lifestyle considerations for each drug class.      CARDIAC REHAB PHASE II EXERCISE from 12/22/2015 in [facility]   Date  11/17/15   Educator  Pharm D   Instruction Review Code  2- meets goals/outcomes      Cardiac Drugs II:  -Group instruction provided by verbal instruction and written materials to support subject.  Instructor reviews cardiac drug classes: angiotensin converting enzyme inhibitors (ACE-I), angiotensin II receptor blockers (ARBs), nitrates, and calcium channel blockers.  Instructor discusses reasons, side effects, and lifestyle considerations for each drug class.      CARDIAC REHAB PHASE II EXERCISE from 12/22/2015 in [place]   Date  10/27/15   Instruction Review Code  2- meets goals/outcomes       Anatomy and Physiology of the Circulatory System:  -Group instruction provided by verbal instruction, video, and written materials to support subject.  Reviews functional anatomy of heart, how it relates to various diagnoses, and what role the heart plays in the overall system.      CARDIAC REHAB PHASE II EXERCISE from 12/22/2015 in [place]   Date  11/03/15   Instruction Review Code  2- meets goals/outcomes      Knowledge Questionnaire Score:     Knowledge Questionnaire Score - 10/12/15 1550    Knowledge Questionnaire Score   Pre Score 17/24      Core Components/Risk Factors/Patient Goals at Admission:     Personal Goals and Risk Factors at Admission - 10/12/15 1355    Core Components/Risk Factors/Patient Goals on Admission   Improve shortness of breath with ADL's Yes  able to return to normal activities, learn limits   Intervention Provide education, individualized exercise plan and daily activity instruction to help decrease symptoms of SOB with activities of daily living.   Expected Outcomes Short Term: Achieves a reduction of symptoms when performing  activities of daily living.   Lipids Yes   Intervention Provide education and support for participant on nutrition & aerobic/resistive exercise along with prescribed medications to achieve LDL 70mg , HDL >40mg .   Expected Outcomes Short Term: Participant states understanding of desired cholesterol values and is compliant with medications prescribed. Participant is following exercise prescription and nutrition guidelines.;Long Term: Cholesterol controlled with medications as prescribed, with individualized exercise RX and with personalized nutrition plan. Value goals: LDL < 70mg , HDL > 40 mg.   Personal Goal Other Yes   Personal Goal Able to get back to skiing working on house without fear of future events   Intervention Follow exercise prescription   Expected Outcomes Increase stamina and  confidence      Core Components/Risk Factors/Patient Goals Review:      Goals and Risk Factor Review      10/27/15 1031 12/22/15 1639         Core Components/Risk Factors/Patient Goals Review   Personal Goals Review Improve shortness of breath with ADL's;Lipids;Other Improve shortness of breath with ADL's;Increase Strength and Stamina      Review Shortness of breath about the same, encouraged to try taking Birlinta with his coffee to try to help.  No problems with statins.  Feels like he could go skiing tomorrow! Pt states no longer having SOB since Brilinta was d/c'd. Pt walking daily.      Expected Outcomes Improved SOB and continue to increase functional capacity Continue exercise program to maintain goals.         Core Components/Risk Factors/Patient Goals at Discharge (Final Review):      Goals and Risk Factor Review - 12/22/15 1639    Core Components/Risk Factors/Patient Goals Review   Personal Goals Review Improve shortness of breath with ADL's;Increase Strength and Stamina   Review Pt states no longer having SOB since Brilinta was d/c'd. Pt walking daily.   Expected Outcomes Continue exercise program to maintain goals.      ITP Comments:     ITP Comments      10/12/15 1522           ITP Comments Dr Fransico Him is the medical director          Comments: Pt is making expected progress toward personal goals after completing 21sessions. Recommend continued exercise and life style modification education including  stress management and relaxation techniques to decrease cardiac risk profile. Dagim is enjoying participation in cardiac rehab.Barnet Pall, RN,BSN 12/30/2015 10:07 AM

## 2015-12-29 ENCOUNTER — Encounter (HOSPITAL_COMMUNITY): Payer: PPO

## 2015-12-31 ENCOUNTER — Encounter (HOSPITAL_COMMUNITY): Admission: RE | Admit: 2015-12-31 | Payer: PPO | Source: Ambulatory Visit

## 2016-01-03 ENCOUNTER — Encounter (HOSPITAL_COMMUNITY): Admission: RE | Admit: 2016-01-03 | Payer: PPO | Source: Ambulatory Visit

## 2016-01-05 ENCOUNTER — Encounter (HOSPITAL_COMMUNITY): Payer: PPO

## 2016-01-07 ENCOUNTER — Encounter (HOSPITAL_COMMUNITY): Payer: PPO

## 2016-01-10 ENCOUNTER — Encounter (HOSPITAL_COMMUNITY)
Admission: RE | Admit: 2016-01-10 | Discharge: 2016-01-10 | Disposition: A | Discharge status: Home / Self Care | Payer: PPO | Source: Ambulatory Visit | Attending: Internal Medicine | Admitting: Internal Medicine

## 2016-01-10 DIAGNOSIS — Z955 Presence of coronary angioplasty implant and graft: Secondary | ICD-10-CM

## 2016-01-10 DIAGNOSIS — I214 Non-ST elevation (NSTEMI) myocardial infarction: Secondary | ICD-10-CM | POA: Diagnosis not present

## 2016-01-12 ENCOUNTER — Encounter (HOSPITAL_COMMUNITY)
Admission: RE | Admit: 2016-01-12 | Discharge: 2016-01-12 | Disposition: A | Discharge status: Home / Self Care | Payer: PPO | Source: Ambulatory Visit | Attending: Internal Medicine | Admitting: Internal Medicine

## 2016-01-12 DIAGNOSIS — I214 Non-ST elevation (NSTEMI) myocardial infarction: Secondary | ICD-10-CM

## 2016-01-12 DIAGNOSIS — Z955 Presence of coronary angioplasty implant and graft: Secondary | ICD-10-CM

## 2016-01-14 ENCOUNTER — Encounter (HOSPITAL_COMMUNITY)
Admission: RE | Admit: 2016-01-14 | Discharge: 2016-01-14 | Disposition: A | Discharge status: Home / Self Care | Payer: PPO | Source: Ambulatory Visit | Attending: Internal Medicine | Admitting: Internal Medicine

## 2016-01-14 VITALS — Ht 72.0 in | Wt 193.6 lb

## 2016-01-14 DIAGNOSIS — I214 Non-ST elevation (NSTEMI) myocardial infarction: Secondary | ICD-10-CM | POA: Diagnosis not present

## 2016-01-14 DIAGNOSIS — Z955 Presence of coronary angioplasty implant and graft: Secondary | ICD-10-CM

## 2016-01-17 ENCOUNTER — Encounter (HOSPITAL_COMMUNITY): Admission: RE | Admit: 2016-01-17 | Payer: PPO | Source: Ambulatory Visit

## 2016-01-19 ENCOUNTER — Encounter (HOSPITAL_COMMUNITY): Payer: PPO

## 2016-01-21 ENCOUNTER — Encounter (HOSPITAL_COMMUNITY): Payer: PPO

## 2016-02-14 ENCOUNTER — Telehealth: Payer: Self-pay | Admitting: Family

## 2016-02-14 NOTE — Telephone Encounter (Signed)
[facility] - Client [place] [facility]  Patient Name: Billy [name]  DOB: 01-Mar-1941    Initial Comment Caller states he has pain in his foot.    Nurse Assessment  Nurse: Wynetta Emery, RN, Baker Janus Date/Time Eilene Ghazi Time): 02/14/2016 9:32:45 AM  Confirm and document reason for call. If symptomatic, describe symptoms. You must click the next button to save text entered. ---Billy [name] has pain in his right foot has been hurting for several weeks; believes it is plantar facitis  Has the patient traveled out of the country within the last 30 days? ---No  Does the patient have any new or worsening symptoms? ---Yes  Will a triage be completed? ---Yes  Related visit to physician within the last 2 weeks? ---No  Does the PT have any chronic conditions? (i.e. diabetes, asthma, etc.) ---No  Is this a behavioral health or substance abuse call? ---No     Guidelines    Guideline Title Affirmed Question Affirmed Notes  Foot Pain [1] MODERATE pain (e.g., interferes with normal activities, limping) AND [2] present > 3 days    Final Disposition User   See PCP When Office is Open (within 3 days) Wynetta Emery, RN, Baker Janus    Referrals  REFERRED TO PCP OFFICE   Disagree/Comply: Leta Baptist

## 2016-02-14 NOTE — Addendum Note (Signed)
Encounter addended by: Jewel Baize, RD on: 02/14/2016 11:55 AM<BR>     Documentation filed: Arn Medal VN

## 2016-02-15 NOTE — Progress Notes (Signed)
Discharge Summary  Patient Details  Name: Billy [name] MRN: [id number] Date of Birth: [birth year] Referring Provider:            CARDIAC REHAB PHASE II EXERCISE from 11/17/2015 in [place]   Referring Provider  Hilty,Chad       Number of Visits: 27  Reason for Discharge:  Early Exit:  spending the summer at the beach.  Smoking History:  History  Smoking status  . Former Smoker -- 1.00 packs/day for 20 years  . Types: Cigarettes  . Quit date: 08/07/1978  Smokeless tobacco  . Never Used    Diagnosis:  NSTEMI (non-ST elevated myocardial infarction) ([place])  S/P coronary artery stent placement  ADL UCSD:   Initial Exercise Prescription:     Initial Exercise Prescription - 11/18/15 1418    Date of Initial Exercise RX and Referring Provider   Date 10/12/15   Referring Provider Hilty,Chad MD   Treadmill   MPH 2.5   Grade 1   Minutes 10   METs 3.26   Bike   Level 1.5   Minutes 10   METs 4.17   NuStep   Level 3   Minutes 10   METs 3.2   Resistance Training   Training Prescription Yes   Weight 4 lbs   Reps 10-12      Discharge Exercise Prescription (Final Exercise Prescription Changes):     Exercise Prescription Changes - 01/14/16 1100    Exercise Review   Progression Yes   Response to Exercise   Blood Pressure (Admit) 126/92 mmHg   Blood Pressure (Exercise) 174/70 mmHg   Blood Pressure (Exit) 142/90 mmHg   Heart Rate (Admit) 67 bpm   Heart Rate (Exercise) 108 bpm   Heart Rate (Exit) 66 bpm   Rating of Perceived Exertion (Exercise) 10   Symptoms none   Comments Reviewed home exercise 10/27/15   Duration Progress to 30 minutes of continuous aerobic without signs/symptoms of physical distress   Intensity THRR unchanged   Progression   Progression Continue to progress workloads to maintain intensity without signs/symptoms of physical distress.   Average METs 4.7   Resistance Training   Training Prescription Yes   Weight 4 lbs   Reps 10-12   Interval Training   Interval Training No   Treadmill   MPH 3.2   Grade 4   Minutes 10   METs 5.21   Bike   Level 1.5   Minutes 10   METs 4.22   Home Exercise Plan   Plans to continue exercise at Home  walking and biking   Frequency Add 3 additional days to program exercise sessions.      Functional Capacity:     6 Minute Walk      10/12/15 1543 01/14/16 1102     6 Minute Walk   Phase Initial Discharge    Distance 1641 feet 2000 feet    Walk Time 1641 minutes 6 minutes    # of Rest Breaks 0 0    MPH 3.1 3.8    METS 3.21 4.2    RPE 11 12    VO2 Peak 11.2 14.8    Symptoms No No    Resting HR 59 bpm 67 bpm    Resting BP 134/80 mmHg 126/92 mmHg    Max Ex. HR 78 bpm 109 bpm    Max Ex. BP  140/94 mmHg    2 Minute Post BP  142/90 mmHg  Psychological, QOL, Others - Outcomes: PHQ 2/9: Depression screen [facility] 2/9 02/15/2016 01/14/2016 10/18/2015  Decreased Interest 0 0 0  Down, Depressed, Hopeless 0 0 0  PHQ - 2 Score 0 0 0    Quality of Life:     Quality of Life - 01/14/16 1100    Quality of Life Scores   Health/Function Post 30 %   Socioeconomic Post 30 %   Psych/Spiritual Post 30 %   Family Post 30 %   GLOBAL Post 30 %      Personal Goals: Goals established at orientation with interventions provided to work toward goal.     Personal Goals and Risk Factors at Admission - 10/12/15 1355    Core Components/Risk Factors/Patient Goals on Admission   Improve shortness of breath with ADL's Yes  able to return to normal activities, learn limits   Intervention Provide education, individualized exercise plan and daily activity instruction to help decrease symptoms of SOB with activities of daily living.   Expected Outcomes Short Term: Achieves a reduction of symptoms when performing activities of daily living.   Lipids Yes   Intervention Provide education and support for participant on nutrition & aerobic/resistive exercise along  with prescribed medications to achieve LDL <43m, HDL >458m   Expected Outcomes Short Term: Participant states understanding of desired cholesterol values and is compliant with medications prescribed. Participant is following exercise prescription and nutrition guidelines.;Long Term: Cholesterol controlled with medications as prescribed, with individualized exercise RX and with personalized nutrition plan. Value goals: LDL < 7054mHDL > 40 mg.   Personal Goal Other Yes   Personal Goal Able to get back to skiing working on house without fear of future events   Intervention Follow exercise prescription   Expected Outcomes Increase stamina and confidence       Personal Goals Discharge:     Goals and Risk Factor Review      10/27/15 1031 12/22/15 1639         Core Components/Risk Factors/Patient Goals Review   Personal Goals Review Improve shortness of breath with ADL's;Lipids;Other Improve shortness of breath with ADL's;Increase Strength and Stamina      Review Shortness of breath about the same, encouraged to try taking Birlinta with his coffee to try to help.  No problems with statins.  Feels like he could go skiing tomorrow! Pt states no longer having SOB since Brilinta was d/c'd. Pt walking daily.      Expected Outcomes Improved SOB and continue to increase functional capacity Continue exercise program to maintain goals.         Nutrition & Weight - Outcomes:     Pre Biometrics - 10/12/15 1550    Pre Biometrics   Height 6' (1.829 m)   Weight 200 lb 13.4 oz (91.1 kg)   Waist Circumference 36 inches   Hip Circumference 41 inches   Waist to Hip Ratio 0.88 %   BMI (Calculated) 27.3   Triceps Skinfold 10 mm   % Body Fat 23.6 %   Grip Strength 49 kg   Flexibility 9 in         Post Biometrics - 01/14/16 1105     Post  Biometrics   Height 6' (1.829 m)   Weight 193 lb 9 oz (87.8 kg)   Waist Circumference 37 inches   Hip Circumference 39 inches   Waist to Hip Ratio 0.95 %    BMI (Calculated) 26.3   Triceps Skinfold 11 mm   % Body Fat  24.2 %   Grip Strength 54 kg   Flexibility 10 in   Single Leg Stand 14 seconds      Nutrition:     Nutrition Therapy & Goals - 10/13/15 1136    Nutrition Therapy   Diet Therapeutic Lifestyle Changes   Personal Nutrition Goals   Personal Goal #1 Weight Maintenance   Intervention Plan   Intervention Prescribe, educate and counsel regarding individualized specific dietary modifications aiming towards targeted core components such as weight, hypertension, lipid management, diabetes, heart failure and other comorbidities.   Expected Outcomes Short Term Goal: Understand basic principles of dietary content, such as calories, fat, sodium, cholesterol and nutrients.;Long Term Goal: Adherence to prescribed nutrition plan.      Nutrition Discharge:     Nutrition Assessments - 02/14/16 1155    MEDFICTS Scores   Pre Score 33  score verified with pt   Post Score 17   Score Difference -16      Education Questionnaire Score:     Knowledge Questionnaire Score - 01/14/16 1100    Knowledge Questionnaire Score   Post Score 24/24      Goals reviewed with patient; copy given to patient.Pt graduated from cardiac rehab program today with completion of 27 exercise sessions in Phase II. Pt maintained good attendance and progressed nicely during his participation in rehab as evidenced by increased MET level.   Medication list reconciled. Repeat  PHQ score-0  .  Pt has made significant lifestyle changes and should be commended for his success. Pt feels he has achieved his goals during cardiac rehab.   Pt plans to continue exercise via walking.

## 2016-02-15 NOTE — Addendum Note (Signed)
Encounter addended by: Magda Kiel, RN on: 02/15/2016  9:59 AM<BR>     Documentation filed: Episodes, Clinical Notes, Flowsheet VN

## 2016-02-16 ENCOUNTER — Ambulatory Visit (INDEPENDENT_AMBULATORY_CARE_PROVIDER_SITE_OTHER): Payer: PPO | Admitting: Internal Medicine

## 2016-02-16 ENCOUNTER — Encounter: Payer: Self-pay | Admitting: Internal Medicine

## 2016-02-16 VITALS — BP 140/82 | HR 62 | Wt 191.0 lb

## 2016-02-16 DIAGNOSIS — M722 Plantar fascial fibromatosis: Secondary | ICD-10-CM

## 2016-02-16 MED ORDER — VITAMIN D 1000 UNITS PO TABS: 1000.0000 [IU] | ORAL_TABLET | Freq: Every day | ORAL | Status: DC

## 2016-02-16 MED ORDER — MELOXICAM 7.5 MG PO TABS: 7.5000 mg | ORAL_TABLET | Freq: Every day | ORAL | Status: DC

## 2016-02-16 NOTE — Assessment & Plan Note (Addendum)
Use Meloxicam - take with OTC Zantac Bleeding risk discussed Arch supports [employer]

## 2016-02-16 NOTE — Progress Notes (Signed)
   Subjective:  Patient ID: Billy [name], male    DOB: [birth year]  Age: 75 y.o. MRN: [id number]  CC: Foot Pain   HPI Billy [name] presents for R heel pain x 1 month  Outpatient Prescriptions Prior to Visit  Medication Sig Dispense Refill  . aspirin EC 81 MG tablet Take 81 mg by mouth daily.    Marland Kitchen atorvastatin (LIPITOR) 40 MG tablet Take 1 tablet (40 mg total) by mouth daily. 90 tablet 3  . nitroGLYCERIN (NITROSTAT) 0.4 MG SL tablet Place 1 tablet (0.4 mg total) under the tongue every 5 (five) minutes as needed for chest pain. Max 3 doses. 25 tablet 3  . prasugrel (EFFIENT) 10 MG TABS tablet Take 1 tablet (10 mg total) by mouth daily. 90 tablet 3   No facility-administered medications prior to visit.    ROS Review of Systems  Genitourinary: Negative for hematuria.  Skin: Negative for pallor.  Hematological: Does not bruise/bleed easily.    Objective:  BP 140/82 mmHg  Pulse 62  Wt 191 lb (86.637 kg)  SpO2 97%  BP Readings from Last 3 Encounters:  02/16/16 140/82  12/20/15 126/76  11/12/15 132/85    Wt Readings from Last 3 Encounters:  02/16/16 191 lb (86.637 kg)  01/14/16 193 lb 9 oz (87.8 kg)  12/20/15 196 lb 6.4 oz (89.086 kg)    Physical Exam  Constitutional: He appears well-developed.  Cardiovascular:  No murmur heard. Pulmonary/Chest: He has no rales.  Abdominal: There is no tenderness.  Musculoskeletal: He exhibits tenderness. He exhibits no edema.    Lab Results  Component Value Date   WBC 8.6 07/27/2015   HGB 14.8 07/27/2015   HCT 44.7 07/27/2015   PLT 282.0 07/27/2015   GLUCOSE 94 12/09/2015   CHOL 137 12/09/2015   TRIG 67 12/09/2015   HDL 52 12/09/2015   LDLDIRECT 144.0 11/21/2010   LDLCALC 72 12/09/2015   ALT 22 12/09/2015   AST 20 12/09/2015   NA 136 12/09/2015   K 4.5 12/09/2015   CL 106 12/09/2015   CREATININE 0.85 12/09/2015   BUN 23 12/09/2015   CO2 23 12/09/2015   TSH 0.70 05/21/2014   PSA 2.26 07/27/2015    No results  found.  Assessment & Plan:   There are no diagnoses linked to this encounter. I am having Mr. Edmundson maintain his aspirin EC, atorvastatin, nitroGLYCERIN, and prasugrel.  No orders of the defined types were placed in this encounter.     Follow-up: No Follow-up on file.  Walker Kehr, MD

## 2016-02-16 NOTE — Progress Notes (Signed)
Pre visit review using our clinic review tool, if applicable. No additional management support is needed unless otherwise documented below in the visit note. 

## 2016-02-16 NOTE — Patient Instructions (Signed)
Use Meloxicam - take with OTC Zantac   Plantar Fasciitis Plantar fasciitis is a painful foot condition that affects the heel. It occurs when the band of tissue that connects the toes to the heel bone (plantar fascia) becomes irritated. This can happen after exercising too much or doing other repetitive activities (overuse injury). The pain from plantar fasciitis can range from mild irritation to severe pain that makes it difficult for you to walk or move. The pain is usually worse in the morning or after you have been sitting or lying down for a while. CAUSES This condition may be caused by:  Standing for long periods of time.  Wearing shoes that do not fit.  Doing high-impact activities, including running, aerobics, and ballet.  Being overweight.  Having an abnormal way of walking (gait).  Having tight calf muscles.  Having high arches in your feet.  Starting a new athletic activity. SYMPTOMS The main symptom of this condition is heel pain. Other symptoms include:  Pain that gets worse after activity or exercise.  Pain that is worse in the morning or after resting.  Pain that goes away after you walk for a few minutes. DIAGNOSIS This condition may be diagnosed based on your signs and symptoms. Your health care provider will also do a physical exam to check for:  A tender area on the bottom of your foot.  A high arch in your foot.  Pain when you move your foot.  Difficulty moving your foot. You may also need to have imaging studies to confirm the diagnosis. These can include:  X-rays.  Ultrasound.  MRI. TREATMENT  Treatment for plantar fasciitis depends on the severity of the condition. Your treatment may include:  Rest, ice, and over-the-counter pain medicines to manage your pain.  Exercises to stretch your calves and your plantar fascia.  A splint that holds your foot in a stretched, upward position while you sleep (night splint).  Physical therapy to  relieve symptoms and prevent problems in the future.  Cortisone injections to relieve severe pain.  Extracorporeal shock wave therapy (ESWT) to stimulate damaged plantar fascia with electrical impulses. It is often used as a last resort before surgery.  Surgery, if other treatments have not worked after 12 months. HOME CARE INSTRUCTIONS  Take medicines only as directed by your health care provider.  Avoid activities that cause pain.  Roll the bottom of your foot over a bag of ice or a bottle of cold water. Do this for 20 minutes, 3-4 times a day.  Perform simple stretches as directed by your health care provider.  Try wearing athletic shoes with air-sole or gel-sole cushions or soft shoe inserts.  Wear a night splint while sleeping, if directed by your health care provider.  Keep all follow-up appointments with your health care provider. PREVENTION   Do not perform exercises or activities that cause heel pain.  Consider finding low-impact activities if you continue to have problems.  Lose weight if you need to. The best way to prevent plantar fasciitis is to avoid the activities that aggravate your plantar fascia. SEEK MEDICAL CARE IF:  Your symptoms do not go away after treatment with home care measures.  Your pain gets worse.  Your pain affects your ability to move or do your daily activities.   This information is not intended to replace advice given to you by your health care provider. Make sure you discuss any questions you have with your health care provider.   Document  Released: 04/18/2001 Document Revised: 04/14/2015 Document Reviewed: 06/03/2014 Elsevier Interactive Patient Education [employer].

## 2016-03-07 DIAGNOSIS — L570 Actinic keratosis: Secondary | ICD-10-CM | POA: Diagnosis not present

## 2016-03-13 ENCOUNTER — Ambulatory Visit (INDEPENDENT_AMBULATORY_CARE_PROVIDER_SITE_OTHER): Payer: PPO | Admitting: Internal Medicine

## 2016-03-13 ENCOUNTER — Encounter: Payer: Self-pay | Admitting: Internal Medicine

## 2016-03-13 DIAGNOSIS — R030 Elevated blood-pressure reading, without diagnosis of hypertension: Secondary | ICD-10-CM | POA: Diagnosis not present

## 2016-03-13 DIAGNOSIS — H00016 Hordeolum externum left eye, unspecified eyelid: Secondary | ICD-10-CM | POA: Diagnosis not present

## 2016-03-13 DIAGNOSIS — IMO0001 Reserved for inherently not codable concepts without codable children: Secondary | ICD-10-CM | POA: Insufficient documentation

## 2016-03-13 DIAGNOSIS — H00019 Hordeolum externum unspecified eye, unspecified eyelid: Secondary | ICD-10-CM | POA: Insufficient documentation

## 2016-03-13 DIAGNOSIS — M722 Plantar fascial fibromatosis: Secondary | ICD-10-CM

## 2016-03-13 MED ORDER — DOXYCYCLINE HYCLATE 50 MG PO CAPS: 50.0000 mg | ORAL_CAPSULE | Freq: Two times a day (BID) | ORAL | 0 refills | Status: DC

## 2016-03-13 MED ORDER — ERYTHROMYCIN 5 MG/GM OP OINT: 1.0000 "application " | TOPICAL_OINTMENT | Freq: Four times a day (QID) | OPHTHALMIC | 0 refills | Status: DC

## 2016-03-13 MED ORDER — DOXYCYCLINE HYCLATE 50 MG PO CAPS: 50.0000 mg | ORAL_CAPSULE | Freq: Two times a day (BID) | ORAL | 0 refills | Status: AC

## 2016-03-13 MED ORDER — PREDNISONE 10 MG PO TABS
ORAL_TABLET | ORAL | 1 refills | Status: DC
Start: 2016-03-13 — End: 2016-07-11

## 2016-03-13 MED ORDER — ERYTHROMYCIN 5 MG/GM OP OINT: 1.0000 "application " | TOPICAL_OINTMENT | Freq: Every day | OPHTHALMIC | 0 refills | Status: DC

## 2016-03-13 NOTE — Progress Notes (Signed)
Subjective:  Patient ID: Billy [name], male    DOB: May 02, 1941  Age: 75 y.o. MRN: [id number]  CC: Eye Pain (x 3 weeks); Eye Drainage (x 3 weeks); and Plantar Fasciitis (Left foot- tender in mornings)   HPI Billy [name] presents for R heel plantar fasciitis - not better C/o red eye L, pain x 3 weeks - worse BP is ok at home  Outpatient Medications Prior to Visit  Medication Sig Dispense Refill  . aspirin EC 81 MG tablet Take 81 mg by mouth daily.    Marland Kitchen atorvastatin (LIPITOR) 40 MG tablet Take 1 tablet (40 mg total) by mouth daily. 90 tablet 3  . cholecalciferol (VITAMIN D) 1000 units tablet Take 1 tablet (1,000 Units total) by mouth daily. 100 tablet 3  . nitroGLYCERIN (NITROSTAT) 0.4 MG SL tablet Place 1 tablet (0.4 mg total) under the tongue every 5 (five) minutes as needed for chest pain. Max 3 doses. 25 tablet 3  . prasugrel (EFFIENT) 10 MG TABS tablet Take 1 tablet (10 mg total) by mouth daily. 90 tablet 3  . meloxicam (MOBIC) 7.5 MG tablet Take 1 tablet (7.5 mg total) by mouth daily. (Patient not taking: Reported on 03/13/2016) 14 tablet 0   No facility-administered medications prior to visit.     ROS Review of Systems  Constitutional: Negative for appetite change, fatigue, fever and unexpected weight change.  HENT: Negative for congestion, nosebleeds, sneezing, sore throat and trouble swallowing.   Eyes: Positive for pain, discharge and redness. Negative for photophobia and itching.  Respiratory: Negative for cough.   Cardiovascular: Negative for chest pain, palpitations and leg swelling.  Gastrointestinal: Negative for abdominal distention, blood in stool, diarrhea and nausea.  Genitourinary: Negative for frequency and hematuria.  Musculoskeletal: Positive for gait problem. Negative for back pain, joint swelling and neck pain.  Skin: Negative for rash.  Neurological: Negative for dizziness, tremors, speech difficulty and weakness.  Psychiatric/Behavioral: Negative for  agitation, dysphoric mood and sleep disturbance. The patient is not nervous/anxious.     Objective:  BP (!) 170/90   Pulse (!) 58   Temp 97.9 F (36.6 C) (Oral)   Wt 193 lb (87.5 kg)   SpO2 98%   BMI 26.18 kg/m   BP Readings from Last 3 Encounters:  03/13/16 (!) 170/90  02/16/16 140/82  12/20/15 126/76    Wt Readings from Last 3 Encounters:  03/13/16 193 lb (87.5 kg)  02/16/16 191 lb (86.6 kg)  01/14/16 193 lb 9 oz (87.8 kg)    Physical Exam  Constitutional: He is oriented to person, place, and time. He appears well-developed. No distress.  NAD  HENT:  Mouth/Throat: Oropharynx is clear and moist.  Eyes: Conjunctivae are normal. Pupils are equal, round, and reactive to light. Right eye exhibits discharge.  Neck: Normal range of motion. No JVD present. No thyromegaly present.  Cardiovascular: Normal rate, regular rhythm, normal heart sounds and intact distal pulses.  Exam reveals no gallop and no friction rub.   No murmur heard. Pulmonary/Chest: Effort normal and breath sounds normal. No respiratory distress. He has no wheezes. He has no rales. He exhibits no tenderness.  Abdominal: Soft. Bowel sounds are normal. He exhibits no distension and no mass. There is no tenderness. There is no rebound and no guarding.  Musculoskeletal: Normal range of motion. He exhibits tenderness. He exhibits no edema.  Lymphadenopathy:    He has no cervical adenopathy.  Neurological: He is alert and oriented to person, place, and  time. He has normal reflexes. No cranial nerve deficit. He exhibits normal muscle tone. He displays a negative Romberg sign. Coordination and gait normal.  Skin: Skin is warm and dry. No rash noted.  Psychiatric: He has a normal mood and affect. His behavior is normal. Judgment and thought content normal.   L eye w/a stye, redness and swelling of the lower eyelid and conjunctiva R heel is tender  Lab Results  Component Value Date   WBC 8.6 07/27/2015   HGB 14.8  07/27/2015   HCT 44.7 07/27/2015   PLT 282.0 07/27/2015   GLUCOSE 94 12/09/2015   CHOL 137 12/09/2015   TRIG 67 12/09/2015   HDL 52 12/09/2015   LDLDIRECT 144.0 11/21/2010   LDLCALC 72 12/09/2015   ALT 22 12/09/2015   AST 20 12/09/2015   NA 136 12/09/2015   K 4.5 12/09/2015   CL 106 12/09/2015   CREATININE 0.85 12/09/2015   BUN 23 12/09/2015   CO2 23 12/09/2015   TSH 0.70 05/21/2014   PSA 2.26 07/27/2015    No results found.  Assessment & Plan:   There are no diagnoses linked to this encounter. I am having Mr. Kren maintain his aspirin EC, atorvastatin, nitroGLYCERIN, prasugrel, meloxicam, and cholecalciferol.  No orders of the defined types were placed in this encounter.    Follow-up: No Follow-up on file.  Walker Kehr, MD

## 2016-03-13 NOTE — Patient Instructions (Signed)
Stye A stye is a bump on your eyelid caused by a bacterial infection. A stye can form inside the eyelid (internal stye) or outside the eyelid (external stye). An internal stye may be caused by an infected oil-producing gland inside your eyelid. An external stye may be caused by an infection at the base of your eyelash (hair follicle). Styes are very common. Anyone can get them at any age. They usually occur in just one eye, but you may have more than one in either eye.  CAUSES  The infection is almost always caused by bacteria called Staphylococcus aureus. This is a common type of bacteria that lives on your skin. RISK FACTORS You may be at higher risk for a stye if you have had one before. You may also be at higher risk if you have:  Diabetes.  Long-term illness.  Long-term eye redness.  A skin condition called seborrhea.  High fat levels in your blood (lipids). SIGNS AND SYMPTOMS  Eyelid pain is the most common symptom of a stye. Internal styes are more painful than external styes. Other signs and symptoms may include:  Painful swelling of your eyelid.  A scratchy feeling in your eye.  Tearing and redness of your eye.  Pus draining from the stye. DIAGNOSIS  Your health care provider may be able to diagnose a stye just by examining your eye. The health care provider may also check to make sure:  You do not have a fever or other signs of a more serious infection.  The infection has not spread to other parts of your eye or areas around your eye. TREATMENT  Most styes will clear up in a few days without treatment. In some cases, you may need to use antibiotic drops or ointment to prevent infection. Your health care provider may have to drain the stye surgically if your stye is:  Large.  Causing a lot of pain.  Interfering with your vision. This can be done using a thin blade or a needle.  HOME CARE INSTRUCTIONS   Take medicines only as directed by your health care  provider.  Apply a clean, warm compress to your eye for 10 minutes, 4 times a day.  Do not wear contact lenses or eye makeup until your stye has healed.  Do not try to pop or drain the stye. SEEK MEDICAL CARE IF:  You have chills or a fever.  Your stye does not go away after several days.  Your stye affects your vision.  Your eyeball becomes swollen, red, or painful. MAKE SURE YOU:  Understand these instructions.  Will watch your condition.  Will get help right away if you are not doing well or get worse.   This information is not intended to replace advice given to you by your health care provider. Make sure you discuss any questions you have with your health care provider.   Document Released: 05/03/2005 Document Revised: 08/14/2014 Document Reviewed: 11/07/2013 Elsevier Interactive Patient Education 2016 Elsevier Inc.  

## 2016-03-13 NOTE — Assessment & Plan Note (Addendum)
Not better Prednisone 10 mg: take 4 tabs a day x 3 days; then 3 tabs a day x 4 days; then 2 tabs a day x 4 days, then 1 tab a day x 6 days, then stop. Take pc - start in 1-2 weeks We can do a steroid inj if not better

## 2016-03-13 NOTE — Assessment & Plan Note (Signed)
Doxy x 10 d Erythro oint

## 2016-03-13 NOTE — Assessment & Plan Note (Signed)
BP is nl at home per pt

## 2016-03-13 NOTE — Progress Notes (Signed)
Pre visit review using our clinic review tool, if applicable. No additional management support is needed unless otherwise documented below in the visit note. 

## 2016-04-17 ENCOUNTER — Telehealth: Payer: Self-pay | Admitting: Family

## 2016-04-17 NOTE — Telephone Encounter (Signed)
Apppt made for tomorrow w/Greg Calone...Johny Chess

## 2016-04-17 NOTE — Telephone Encounter (Signed)
Patient Name: Billy [name] DOB: [birth year] Initial Comment Caller states had virus for 10 days and has a cough. Nurse Assessment Nurse: Roosvelt Maser, RN, [facility] Date/Time (Eastern Time): 04/17/2016 8:50:39 AM Confirm and document reason for call. If symptomatic, describe symptoms. You must click the next button to save text entered. ---caller states he has had a cough and generalized weakness for 10 days, hurts to cough and this morning their was blood in the sputum Has the patient traveled out of the country within the last 30 days? ---Not Applicable Does the patient have any new or worsening symptoms? ---Yes Will a triage be completed? ---Yes Related visit to physician within the last 2 weeks? ---No Does the PT have any chronic conditions? (i.e. diabetes, asthma, etc.) ---Unknown Is this a behavioral health or substance abuse call? ---No Guidelines Guideline Title Affirmed Question Affirmed Notes Cough - Acute Productive Coughing up rusty-colored (reddish-brown) sputum Final Disposition User See Physician within 24 Hours Roosvelt Maser, RN, [employer] Referrals REFERRED TO PCP OFFICE Disagree/Comply: Leta Baptist

## 2016-04-18 ENCOUNTER — Ambulatory Visit (INDEPENDENT_AMBULATORY_CARE_PROVIDER_SITE_OTHER): Payer: PPO | Admitting: Family

## 2016-04-18 ENCOUNTER — Encounter: Payer: Self-pay | Admitting: Family

## 2016-04-18 ENCOUNTER — Ambulatory Visit (INDEPENDENT_AMBULATORY_CARE_PROVIDER_SITE_OTHER)
Admission: RE | Admit: 2016-04-18 | Discharge: 2016-04-18 | Disposition: A | Discharge status: Home / Self Care | Payer: PPO | Source: Ambulatory Visit | Attending: Family | Admitting: Family

## 2016-04-18 VITALS — BP 120/86 | HR 71 | Temp 98.0°F | Resp 18 | Ht 72.0 in | Wt 187.0 lb

## 2016-04-18 DIAGNOSIS — R0602 Shortness of breath: Secondary | ICD-10-CM | POA: Diagnosis not present

## 2016-04-18 DIAGNOSIS — R05 Cough: Secondary | ICD-10-CM | POA: Insufficient documentation

## 2016-04-18 DIAGNOSIS — Z23 Encounter for immunization: Secondary | ICD-10-CM | POA: Diagnosis not present

## 2016-04-18 DIAGNOSIS — R059 Cough, unspecified: Secondary | ICD-10-CM

## 2016-04-18 IMAGING — DX DG CHEST 2V
2 series · 2 of 2 positions shown · non-contrast
Comparison: None.

CLINICAL DATA: Cough, congestion, shortness of breath for 2 weeks

EXAM:
CHEST  2 VIEW

[chest pa]
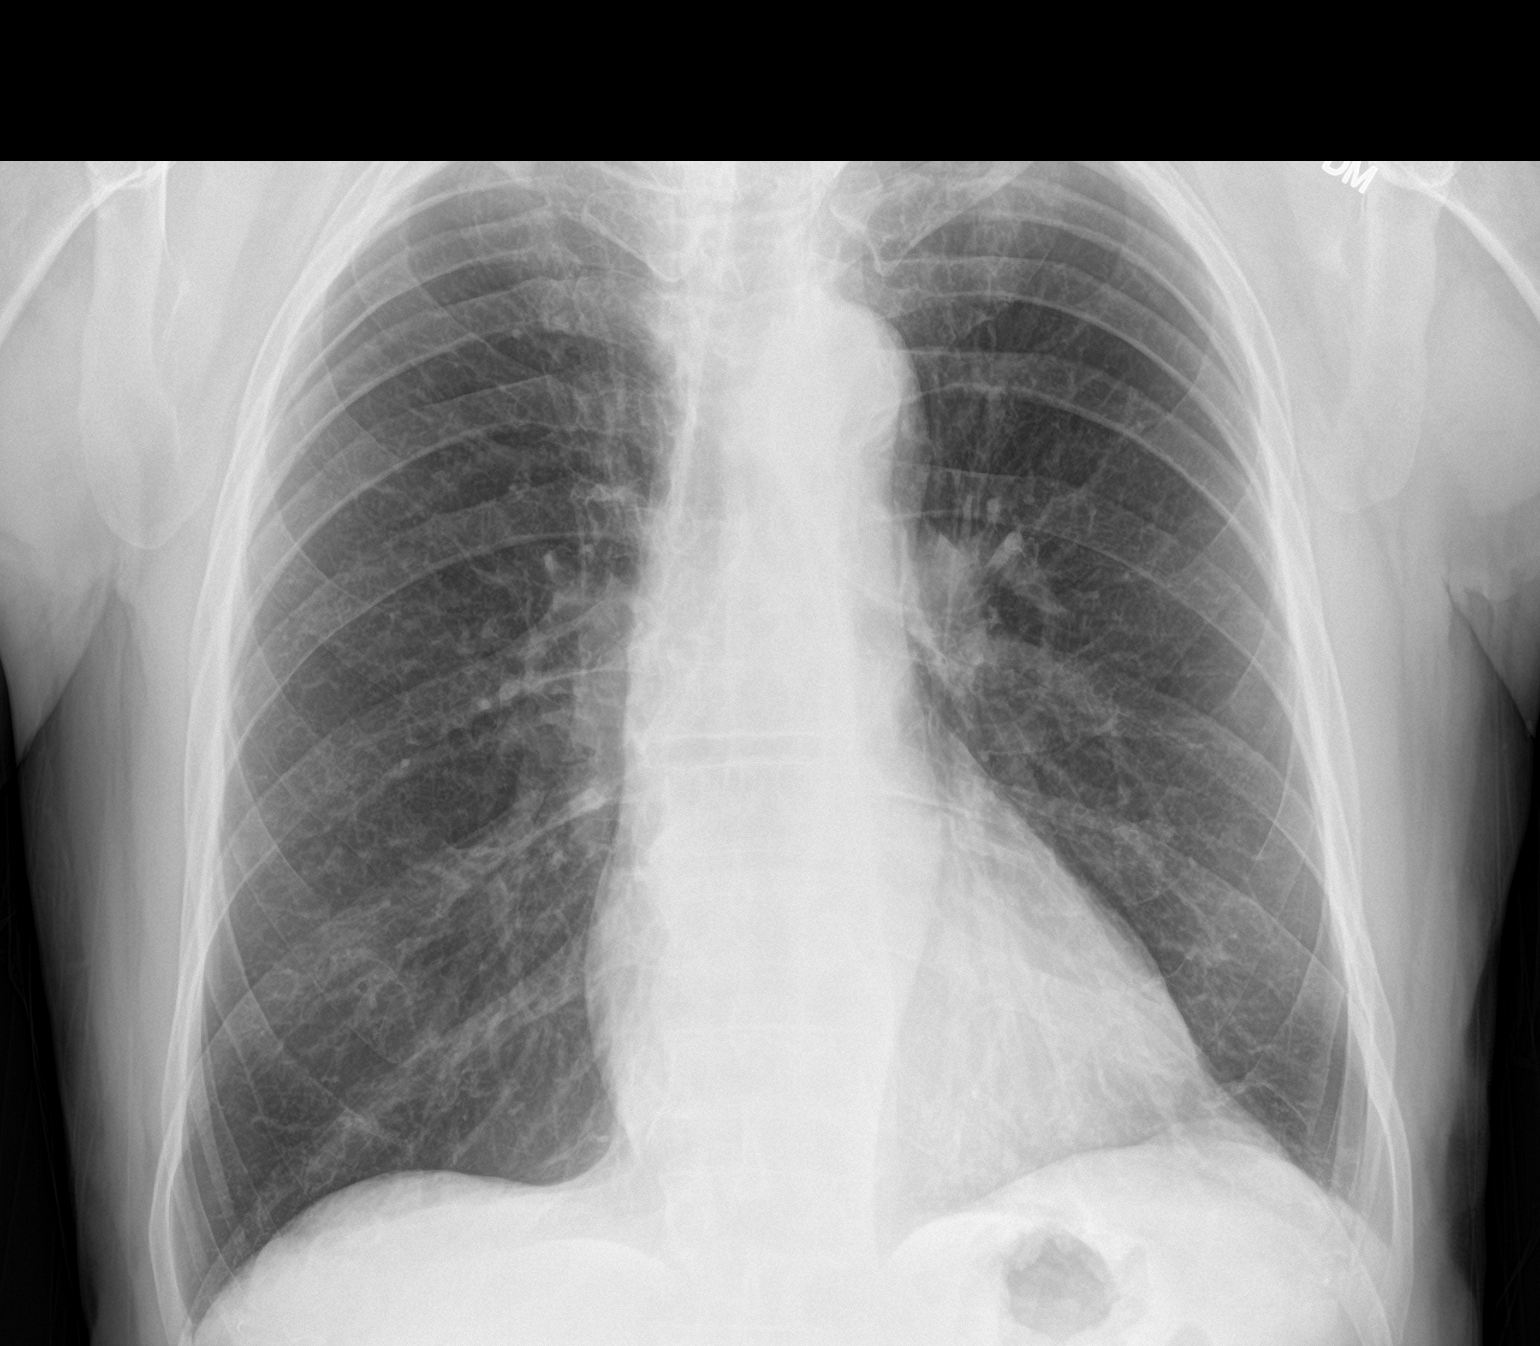

[chest lat]
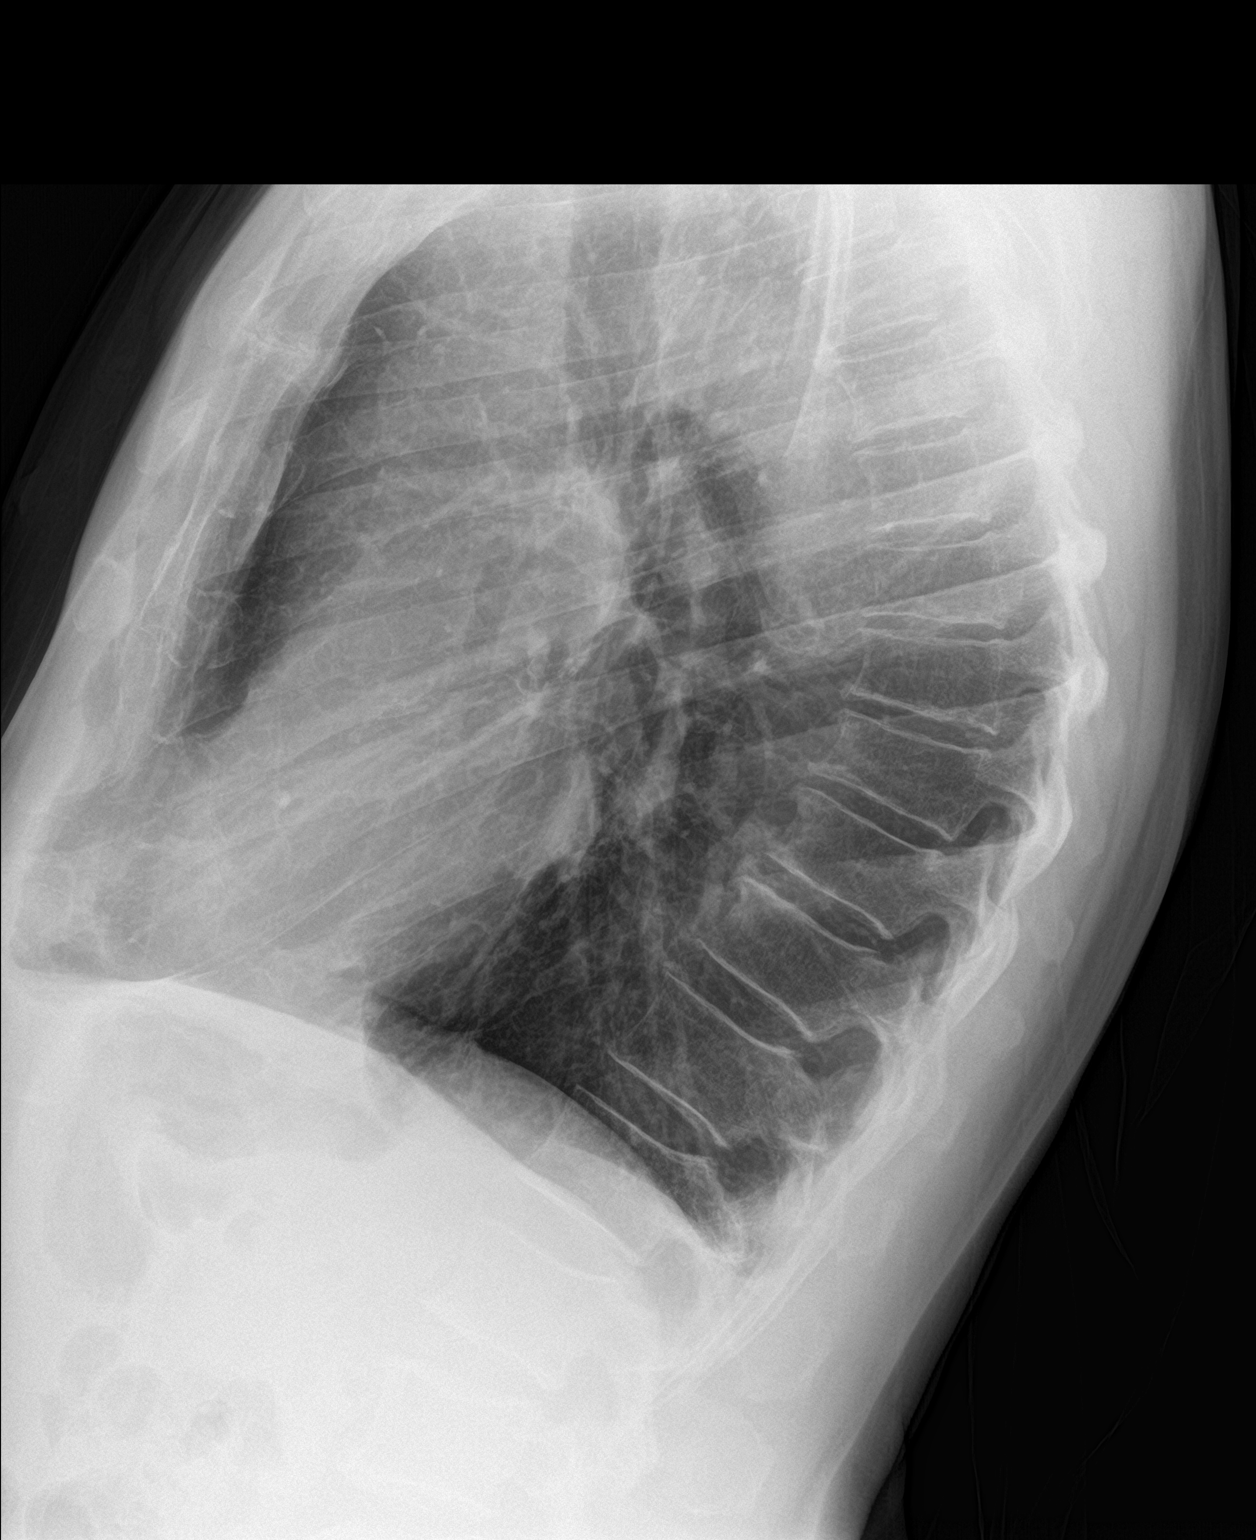

[2 of 2 positions shown; findings below may reference images not displayed]

FINDINGS: Cardiomediastinal silhouette is unremarkable. No acute infiltrate or
pleural effusion. No pulmonary edema. Mild degenerative changes mid
thoracic spine.
IMPRESSION: No active cardiopulmonary disease.

## 2016-04-18 MED ORDER — AZITHROMYCIN 250 MG PO TABS: ORAL_TABLET | ORAL | 0 refills | Status: DC

## 2016-04-18 NOTE — Assessment & Plan Note (Signed)
Symptoms and exam concerning for possible pneumonia versus bronchitis. Start azithromycin. Obtain chest x-ray for confirmation. Continue over-the-counter medications as needed for symptom relief and some care. Follow-up if symptoms worsen or fail to improve.

## 2016-04-18 NOTE — Patient Instructions (Signed)
Thank you for choosing [facility].  SUMMARY AND INSTRUCTIONS:  Medication:  Your prescription(s) have been submitted to your pharmacy or been printed and provided for you. Please take as directed and contact our office if you believe you are having problem(s) with the medication(s) or have any questions.  Imaging / Radiology:  Please stop by radiology on the basement level of the building for your x-rays. Your results will be released to [place] (or called to you) after review, usually within 72 hours after test completion. If any treatments or changes are necessary, you will be notified at that same time.  Follow up:  If your symptoms worsen or fail to improve, please contact our office for further instruction, or in case of emergency go directly to the emergency room at the closest medical facility.   General Recommendations:    Please drink plenty of fluids.  Get plenty of rest   Sleep in humidified air  Use saline nasal sprays  Netti pot   OTC Medications:  Decongestants - helps relieve congestion   Flonase (generic fluticasone) or Nasacort (generic triamcinolone) - please make sure to use the "cross-over" technique at a 45 degree angle towards the opposite eye as opposed to straight up the nasal passageway.   Sudafed (generic pseudoephedrine - Note this is the one that is available behind the pharmacy counter); Products with phenylephrine (-PE) may also be used but is often not as effective as pseudoephedrine.   If you have HIGH BLOOD PRESSURE - Coricidin HBP; AVOID any product that is -D as this contains pseudoephedrine which may increase your blood pressure.  Afrin (oxymetazoline) every 6-8 hours for up to 3 days.   Allergies - helps relieve runny nose, itchy eyes and sneezing   Claritin (generic loratidine), Allegra (fexofenidine), or Zyrtec (generic cyrterizine) for runny nose. These medications should not cause drowsiness.  Note - Benadryl (generic  diphenhydramine) may be used however may cause drowsiness  Cough -   Delsym or Robitussin (generic dextromethorphan)  Expectorants - helps loosen mucus to ease removal   Mucinex (generic guaifenesin) as directed on the package.  Headaches / General Aches   Tylenol (generic acetaminophen) - DO NOT EXCEED 3 grams (3,000 mg) in a 24 hour time period  Advil/Motrin (generic ibuprofen)   Sore Throat -   Salt water gargle   Chloraseptic (generic benzocaine) spray or lozenges / Sucrets (generic dyclonine)

## 2016-04-18 NOTE — Progress Notes (Signed)
Subjective:    Patient ID: Billy [name], male    DOB: 1941/02/16, 75 y.o.   MRN: [id number]  Chief Complaint  Patient presents with  . Cough    was given an antibiotic and steroid last time he was seen and got a rash from one of the meds, cough and no energy, some cheset congestion    HPI:  Billy [name] is a 75 y.o. male who  has a past medical history of Benign neoplasm of colon; Finger fracture; colonoscopy; Internal hemorrhoids without mention of complication; Myocardial infarction ([place]); Plantar fascial fibromatosis; Prostatitis; and Rib fracture. and presents today for a follow up office visit.    This is a new problem. Associated symptom of cough, chest congestion, and fatigue have been going on for a couple of weeks following a flight where someone was sick in front of him. May have some low grade fevers at the time, but was not measured. Has completed a course of doxycycline recently for a stye which he completed prior to illness. Course of the symptoms has improved slightly.  Modifying factors include robutussin which does help a little. Severity is mild/moderate.   Allergies  Allergen Reactions  . Ofloxacin     REACTION: rash      Outpatient Medications Prior to Visit  Medication Sig Dispense Refill  . aspirin EC 81 MG tablet Take 81 mg by mouth daily.    Marland Kitchen atorvastatin (LIPITOR) 40 MG tablet Take 1 tablet (40 mg total) by mouth daily. 90 tablet 3  . erythromycin ophthalmic ointment Place 1 application into the left eye 4 (four) times daily. 3.5 g 0  . nitroGLYCERIN (NITROSTAT) 0.4 MG SL tablet Place 1 tablet (0.4 mg total) under the tongue every 5 (five) minutes as needed for chest pain. Max 3 doses. 25 tablet 3  . prasugrel (EFFIENT) 10 MG TABS tablet Take 1 tablet (10 mg total) by mouth daily. 90 tablet 3  . predniSONE (DELTASONE) 10 MG tablet Start in 1 or 2 weeks. Prednisone 10 mg: take 4 tabs a day x 3 days; then 3 tabs a day x 4 days; then 2 tabs a day x 4  days, then 1 tab a day x 6 days, then stop. Take pc. 38 tablet 1  . cholecalciferol (VITAMIN D) 1000 units tablet Take 1 tablet (1,000 Units total) by mouth daily. 100 tablet 3   No facility-administered medications prior to visit.       Past Surgical History:  Procedure Laterality Date  . COLONOSCOPY    . POLYPECTOMY    . TONSILLECTOMY    . VARICOCELECTOMY     Left      Past Medical History:  Diagnosis Date  . Benign neoplasm of colon   . Finger fracture   . Hx of colonoscopy   . Internal hemorrhoids without mention of complication   . Myocardial infarction ([place])   . Plantar fascial fibromatosis   . Prostatitis   . Rib fracture       Review of Systems  Constitutional: Positive for fatigue and fever. Negative for chills.  HENT: Positive for congestion. Negative for ear pain, sinus pressure and sore throat.   Respiratory: Positive for cough and shortness of breath. Negative for chest tightness.       Objective:    BP 120/86 (BP Location: Left Arm, Patient Position: Sitting, Cuff Size: Large)   Pulse 71   Temp 98 F (36.7 C) (Oral)   Resp 18  Ht 6' (1.829 m)   Wt 187 lb (84.8 kg)   SpO2 94%   BMI 25.36 kg/m  Nursing note and vital signs reviewed.  Physical Exam  Constitutional: He is oriented to person, place, and time. He appears well-developed and well-nourished. No distress.  HENT:  Right Ear: Hearing, tympanic membrane, external ear and ear canal normal.  Left Ear: Hearing, tympanic membrane, external ear and ear canal normal.  Nose: Nose normal.  Mouth/Throat: Uvula is midline, oropharynx is clear and moist and mucous membranes are normal.  Cardiovascular: Normal rate, regular rhythm, normal heart sounds and intact distal pulses.   Pulmonary/Chest: Effort normal. No respiratory distress. He has wheezes. He has rales.  Neurological: He is alert and oriented to person, place, and time.  Skin: Skin is warm and dry.  Psychiatric: He has a normal mood  and affect. His behavior is normal. Judgment and thought content normal.       Assessment & Plan:   Problem List Items Addressed This Visit      Other   Cough - Primary    Symptoms and exam concerning for possible pneumonia versus bronchitis. Start azithromycin. Obtain chest x-ray for confirmation. Continue over-the-counter medications as needed for symptom relief and some care. Follow-up if symptoms worsen or fail to improve.      Relevant Medications   azithromycin (ZITHROMAX) 250 MG tablet   Other Relevant Orders   DG Chest 2 View    Other Visit Diagnoses    Encounter for immunization       Relevant Orders   Flu vaccine HIGH DOSE PF (Completed)       I have discontinued Mr. Tretter's cholecalciferol. I am also having him start on azithromycin. Additionally, I am having him maintain his aspirin EC, atorvastatin, nitroGLYCERIN, prasugrel, predniSONE, and erythromycin.   Meds ordered this encounter  Medications  . azithromycin (ZITHROMAX) 250 MG tablet    Sig: Take 2 tablets by mouth for 1 day then 1 tablet by mouth for 4 days.    Dispense:  6 tablet    Refill:  0    Order Specific Question:   Supervising Provider    Answer:   Pricilla Holm A [id number]     Follow-up: Return if symptoms worsen or fail to improve.  Mauricio Po, FNP

## 2016-04-19 ENCOUNTER — Telehealth: Payer: Self-pay | Admitting: Family

## 2016-04-19 NOTE — Telephone Encounter (Signed)
Patient called in wanting x ray results.  Gave him Greg's response.

## 2016-04-26 ENCOUNTER — Telehealth: Payer: Self-pay | Admitting: Internal Medicine

## 2016-04-26 NOTE — Telephone Encounter (Signed)
Patient calling the office for samples of medication:   1.  What medication and dosage are you requesting samples for? Effient-the cost is now $525  2.  Are you currently out of this medication? no

## 2016-04-26 NOTE — Telephone Encounter (Signed)
Left msg for patient to call. 

## 2016-04-26 NOTE — Telephone Encounter (Signed)
No labs needed .Marland Kitchen Sample him with Effient to get him through until January, then we can discontinue that and keep him on Aspirin.  Dr. Lemmie Evens

## 2016-04-26 NOTE — Telephone Encounter (Signed)
regarding cost of Effient  pt notes he is with [employer]. was covering 40% of cost, now in donut hole - went from $89 responsibility to $525  has about 5 weeks left on supply. wants to get suggestion, if OK to switch to another medication. Should he need to stay on it, he notes he is considering enrollment change to new insurance/Rx coverage next calendar year due to high cost of medication. I have made him aware we do not have samples of Effient now. the savings card which we have applies only for commercial insurance. patient has Medicare coverage, so he wouldn't be able to use.  Also inquiring about needing previsit labs for his upcoming appt in November. Patient was last seen in May, had labs at that time.  Pt aware I will defer these inquiries to Dr. Debara Pickett and follow up with him.

## 2016-04-26 NOTE — Telephone Encounter (Signed)
See notes in other encounter.

## 2016-04-26 NOTE — Telephone Encounter (Signed)
Pt is scheduled to see Dr Debara Pickett on 06-27-16,will he need lab work before his appointment?

## 2016-04-27 NOTE — Telephone Encounter (Signed)
I have patient assistance form for Effient. Have left detailed msg w recommendations on patient assistance for patient.  Requested he call back to arrange time to come in and fill out paperwork w assistance of nurse. He would need to bring a proof of income (Radio broadcast assistant, or W-2, etc) for himself and spouse - we will need to make copy of this to fax to [employer] along with the paperwork.

## 2016-04-27 NOTE — Telephone Encounter (Signed)
Spoke w Erasmo Downer, I have been informed that we no longer get samples of this medication. The discount cards that we do have will not apply for patient's coverage. Do we have any options for him due to his high Rx costs?

## 2016-04-27 NOTE — Telephone Encounter (Signed)
We could switch to Plavix 75 mg daily in addition to his aspirin, however, this is less optimal. Would prefer to stay on Effient until January.  Dr. Lemmie Evens

## 2016-04-27 NOTE — Telephone Encounter (Signed)
Patient called back and we spoke. He understands that we could try the patient assistance program but he declines -- notes his income is too high to qualify for this. Discussed his med costs, he notes actually the $525 is for 90 day supply. I noted this is fairly reasonable for brand prescription costs. Noted Dr. Lysbeth Penner recommendations regarding ideal therapy vs switching to generic, pt states after discussion that he will stay on Effient for now and readdress at 1 yr mark for PCI. He notes Dr. Debara Pickett had discussed possibility of moving to Plavix or discontinuation of DAPT at that time. Pt aware to call if further needs or concerns.

## 2016-04-27 NOTE — Telephone Encounter (Signed)
Billy [name], I'll get you a copy of the Patient Assistance form for Effient.  They do Medicare patients on a case by case basis

## 2016-05-23 DIAGNOSIS — M722 Plantar fascial fibromatosis: Secondary | ICD-10-CM | POA: Diagnosis not present

## 2016-06-06 DIAGNOSIS — H25013 Cortical age-related cataract, bilateral: Secondary | ICD-10-CM | POA: Diagnosis not present

## 2016-06-06 DIAGNOSIS — H2513 Age-related nuclear cataract, bilateral: Secondary | ICD-10-CM | POA: Diagnosis not present

## 2016-06-08 ENCOUNTER — Other Ambulatory Visit: Payer: Self-pay

## 2016-06-08 DIAGNOSIS — L570 Actinic keratosis: Secondary | ICD-10-CM | POA: Diagnosis not present

## 2016-06-08 DIAGNOSIS — C44319 Basal cell carcinoma of skin of other parts of face: Secondary | ICD-10-CM | POA: Diagnosis not present

## 2016-06-27 ENCOUNTER — Ambulatory Visit: Payer: PPO | Admitting: Internal Medicine

## 2016-07-11 ENCOUNTER — Ambulatory Visit (INDEPENDENT_AMBULATORY_CARE_PROVIDER_SITE_OTHER): Payer: PPO | Admitting: Family

## 2016-07-11 ENCOUNTER — Other Ambulatory Visit (INDEPENDENT_AMBULATORY_CARE_PROVIDER_SITE_OTHER): Payer: PPO

## 2016-07-11 ENCOUNTER — Encounter: Payer: Self-pay | Admitting: Family

## 2016-07-11 VITALS — BP 132/88 | HR 53 | Temp 97.7°F | Resp 16 | Ht 72.0 in | Wt 194.0 lb

## 2016-07-11 DIAGNOSIS — I2583 Coronary atherosclerosis due to lipid rich plaque: Secondary | ICD-10-CM | POA: Diagnosis not present

## 2016-07-11 DIAGNOSIS — I2510 Atherosclerotic heart disease of native coronary artery without angina pectoris: Secondary | ICD-10-CM | POA: Diagnosis not present

## 2016-07-11 DIAGNOSIS — Z125 Encounter for screening for malignant neoplasm of prostate: Secondary | ICD-10-CM | POA: Diagnosis not present

## 2016-07-11 DIAGNOSIS — E782 Mixed hyperlipidemia: Secondary | ICD-10-CM | POA: Diagnosis not present

## 2016-07-11 DIAGNOSIS — Z0000 Encounter for general adult medical examination without abnormal findings: Secondary | ICD-10-CM

## 2016-07-11 LAB — COMPREHENSIVE METABOLIC PANEL
ALBUMIN: 4.1 g/dL (ref 3.5–5.2)
ALT: 27 U/L (ref 0–53)
AST: 22 U/L (ref 0–37)
Alkaline Phosphatase: 51 U/L (ref 39–117)
BUN: 19 mg/dL (ref 6–23)
CHLORIDE: 107 meq/L (ref 96–112)
CO2: 27 meq/L (ref 19–32)
Calcium: 9.1 mg/dL (ref 8.4–10.5)
Creatinine, Ser: 0.93 mg/dL (ref 0.40–1.50)
GFR: 84.12 mL/min (ref >60.00)
Glucose, Bld: 94 mg/dL (ref 70–99)
POTASSIUM: 4.6 meq/L (ref 3.5–5.1)
SODIUM: 141 meq/L (ref 135–145)
Total Bilirubin: 0.9 mg/dL (ref 0.2–1.2)
Total Protein: 6.6 g/dL (ref 6.0–8.3)

## 2016-07-11 LAB — PSA, MEDICARE: PSA: 2.07 ng/mL (ref 0.10–4.00)

## 2016-07-11 LAB — CBC
HEMATOCRIT: 43.1 % (ref 39.0–52.0)
HEMOGLOBIN: 14.6 g/dL (ref 13.0–17.0)
MCHC: 33.7 g/dL (ref 30.0–36.0)
MCV: 91.4 fl (ref 78.0–100.0)
PLATELETS: 265 10*3/uL (ref 150.0–400.0)
RBC: 4.72 Mil/uL (ref 4.22–5.81)
RDW: 14.1 % (ref 11.5–15.5)
WBC: 6.9 10*3/uL (ref 4.0–10.5)

## 2016-07-11 NOTE — Assessment & Plan Note (Signed)
Hyperlipidemia appears stable with atorvastatin and no myalgias or adverse side effects. Continue current dosage of atorvastatin.

## 2016-07-11 NOTE — Patient Instructions (Addendum)
Thank you for choosing [facility].  SUMMARY AND INSTRUCTIONS:  Medication:  Keep taking your medications as prescribed.   Your prescription(s) have been submitted to your pharmacy or been printed and provided for you. Please take as directed and contact our office if you believe you are having problem(s) with the medication(s) or have any questions.  Labs:  Please stop by the lab on the lower level of the building for your blood work. Your results will be released to [place] (or called to you) after review, usually within 72 hours after test completion. If any changes need to be made, you will be notified at that same time.  1.) The lab is open from 7:30am to 5:30 pm Monday-Friday 2.) No appointment is necessary 3.) Fasting (if needed) is 6-8 hours after food and drink; black coffee and water are okay   Imaging / Radiology:  Please stop by radiology on the basement level of the building for your x-rays. Your results will be released to [place] (or called to you) after review, usually within 72 hours after test completion. If any treatments or changes are necessary, you will be notified at that same time.  Referrals:  Referrals have been made during this visit. You should expect to hear back from our schedulers in about 7-10 days in regards to establishing an appointment with the specialists we discussed.   Follow up:  If your symptoms worsen or fail to improve, please contact our office for further instruction, or in case of emergency go directly to the emergency room at the closest medical facility.   Health Maintenance  Topic Date Due  . COLONOSCOPY  11/26/2019  . TETANUS/TDAP  11/20/2020  . INFLUENZA VACCINE  Completed  . ZOSTAVAX  Completed  . PNA vac Low Risk Adult  Completed     Health Maintenance, Male A healthy lifestyle and preventative care can promote health and wellness.  Maintain regular health, dental, and eye exams.  Eat a healthy diet. Foods like  vegetables, fruits, whole grains, low-fat dairy products, and lean protein foods contain the nutrients you need and are low in calories. Decrease your intake of foods high in solid fats, added sugars, and salt. Get information about a proper diet from your health care provider, if necessary.  Regular physical exercise is one of the most important things you can do for your health. Most adults should get at least 150 minutes of moderate-intensity exercise (any activity that increases your heart rate and causes you to sweat) each week. In addition, most adults need muscle-strengthening exercises on 2 or more days a week.   Maintain a healthy weight. The body mass index (BMI) is a screening tool to identify possible weight problems. It provides an estimate of body fat based on height and weight. Your health care provider can find your BMI and can help you achieve or maintain a healthy weight. For males 20 years and older:  A BMI below 18.5 is considered underweight.  A BMI of 18.5 to 24.9 is normal.  A BMI of 25 to 29.9 is considered overweight.  A BMI of 30 and above is considered obese.  Maintain normal blood lipids and cholesterol by exercising and minimizing your intake of saturated fat. Eat a balanced diet with plenty of fruits and vegetables. Blood tests for lipids and cholesterol should begin at age 57 and be repeated every 5 years. If your lipid or cholesterol levels are high, you are over age 29, or you are at high  risk for heart disease, you may need your cholesterol levels checked more frequently.Ongoing high lipid and cholesterol levels should be treated with medicines if diet and exercise are not working.  If you smoke, find out from your health care provider how to quit. If you do not use tobacco, do not start.  Lung cancer screening is recommended for adults aged 44-80 years who are at high risk for developing lung cancer because of a history of smoking. A yearly low-dose CT scan of  the lungs is recommended for people who have at least a 30-pack-year history of smoking and are current smokers or have quit within the past 15 years. A pack year of smoking is smoking an average of 1 pack of cigarettes a day for 1 year (for example, a 30-pack-year history of smoking could mean smoking 1 pack a day for 30 years or 2 packs a day for 15 years). Yearly screening should continue until the smoker has stopped smoking for at least 15 years. Yearly screening should be stopped for people who develop a health problem that would prevent them from having lung cancer treatment.  If you choose to drink alcohol, do not have more than 2 drinks per day. One drink is considered to be 12 oz (360 mL) of beer, 5 oz (150 mL) of wine, or 1.5 oz (45 mL) of liquor.  Avoid the use of street drugs. Do not share needles with anyone. Ask for help if you need support or instructions about stopping the use of drugs.  High blood pressure causes heart disease and increases the risk of stroke. High blood pressure is more likely to develop in:  People who have blood pressure in the end of the normal range (100-139/85-89 mm Hg).  People who are overweight or obese.  People who are African American.  If you are 101-40 years of age, have your blood pressure checked every 3-5 years. If you are 73 years of age or older, have your blood pressure checked every year. You should have your blood pressure measured twice-once when you are at a hospital or clinic, and once when you are not at a hospital or clinic. Record the average of the two measurements. To check your blood pressure when you are not at a hospital or clinic, you can use:  An automated blood pressure machine at a pharmacy.  A home blood pressure monitor.  If you are 48-39 years old, ask your health care provider if you should take aspirin to prevent heart disease.  Diabetes screening involves taking a blood sample to check your fasting blood sugar level.  This should be done once every 3 years after age 83 if you are at a normal weight and without risk factors for diabetes. Testing should be considered at a younger age or be carried out more frequently if you are overweight and have at least 1 risk factor for diabetes.  Colorectal cancer can be detected and often prevented. Most routine colorectal cancer screening begins at the age of 65 and continues through age 63. However, your health care provider may recommend screening at an earlier age if you have risk factors for colon cancer. On a yearly basis, your health care provider may provide home test kits to check for hidden blood in the stool. A small camera at the end of a tube may be used to directly examine the colon (sigmoidoscopy or colonoscopy) to detect the earliest forms of colorectal cancer. Talk to your health care provider about  this at age 42 when routine screening begins. A direct exam of the colon should be repeated every 5-10 years through age 15, unless early forms of precancerous polyps or small growths are found.  People who are at an increased risk for hepatitis B should be screened for this virus. You are considered at high risk for hepatitis B if:  You were born in a country where hepatitis B occurs often. Talk with your health care provider about which countries are considered high risk.  Your parents were born in a high-risk country and you have not received a shot to protect against hepatitis B (hepatitis B vaccine).  You have HIV or AIDS.  You use needles to inject street drugs.  You live with, or have sex with, someone who has hepatitis B.  You are a man who has sex with other men (MSM).  You get hemodialysis treatment.  You take certain medicines for conditions like cancer, organ transplantation, and autoimmune conditions.  Hepatitis C blood testing is recommended for all people born from [birth year] through 1965 and any individual with known risk factors for hepatitis  C.  Healthy men should no longer receive prostate-specific antigen (PSA) blood tests as part of routine cancer screening. Talk to your health care provider about prostate cancer screening.  Testicular cancer screening is not recommended for adolescents or adult males who have no symptoms. Screening includes self-exam, a health care provider exam, and other screening tests. Consult with your health care provider about any symptoms you have or any concerns you have about testicular cancer.  Practice safe sex. Use condoms and avoid high-risk sexual practices to reduce the spread of sexually transmitted infections (STIs).  You should be screened for STIs, including gonorrhea and chlamydia if:  You are sexually active and are younger than 24 years.  You are older than 24 years, and your health care provider tells you that you are at risk for this type of infection.  Your sexual activity has changed since you were last screened, and you are at an increased risk for chlamydia or gonorrhea. Ask your health care provider if you are at risk.  If you are at risk of being infected with HIV, it is recommended that you take a prescription medicine daily to prevent HIV infection. This is called pre-exposure prophylaxis (PrEP). You are considered at risk if:  You are a man who has sex with other men (MSM).  You are a heterosexual man who is sexually active with multiple partners.  You take drugs by injection.  You are sexually active with a partner who has HIV.  Talk with your health care provider about whether you are at high risk of being infected with HIV. If you choose to begin PrEP, you should first be tested for HIV. You should then be tested every 3 months for as long as you are taking PrEP.  Use sunscreen. Apply sunscreen liberally and repeatedly throughout the day. You should seek shade when your shadow is shorter than you. Protect yourself by wearing long sleeves, pants, a wide-brimmed hat, and  sunglasses year round whenever you are outdoors.  Tell your health care provider of new moles or changes in moles, especially if there is a change in shape or color. Also, tell your health care provider if a mole is larger than the size of a pencil eraser.  A one-time screening for abdominal aortic aneurysm (AAA) and surgical repair of large AAAs by ultrasound is recommended for men aged 7-75  years who are current or former smokers.  Stay current with your vaccines (immunizations). This information is not intended to replace advice given to you by your health care provider. Make sure you discuss any questions you have with your health care provider. Document Released: 01/20/2008 Document Revised: 08/14/2014 Document Reviewed: 04/27/2015 Elsevier Interactive Patient Education  2017 [employer].

## 2016-07-11 NOTE — Assessment & Plan Note (Signed)
Coronary artery disease currently monitor through risk factor management. Hyperlipidemia well controlled with atorvastatin. Blood pressure appears well controlled. Continue to monitor and follow up with cardiology as scheduled.

## 2016-07-11 NOTE — Progress Notes (Signed)
Subjective:    Patient ID: Billy [name], male    DOB: 06/26/1941, 75 y.o.   MRN: [id number]  Chief Complaint  Patient presents with  . cpe    fasting    HPI:  Billy [name] is a 75 y.o. male who presents today for a Medicare Annual Wellness/Physical exam.    1) Health Maintenance -   Diet - Averages about 3 meals per day consisting of a regular diet; Caffeine intake of 2-3 cups of caffeine daily.  Exercise - Exercising regularly and walks 8-10,000 steps per day  2) Preventative Exams / Immunizations:  Dental -- Up to date  Vision -- Up to date    Health Maintenance  Topic Date Due  . COLONOSCOPY  11/26/2019  . TETANUS/TDAP  11/20/2020  . INFLUENZA VACCINE  Completed  . ZOSTAVAX  Completed  . PNA vac Low Risk Adult  Completed     Immunization History  Administered Date(s) Administered  . Influenza Split 06/23/2015  . Influenza, High Dose Seasonal PF 04/18/2016  . Influenza-Unspecified 05/28/2012, 05/02/2013, 05/07/2014  . Pneumococcal Conjugate-13 07/27/2015  . Pneumococcal Polysaccharide-23 11/21/2010  . Td 11/08/1998  . Tdap 11/21/2010  . Zoster 05/24/2012    RISK FACTORS  Tobacco History  Smoking Status  . Former Smoker  . Packs/day: 1.00  . Years: 20.00  . Types: Cigarettes  . Quit date: 08/07/1978  Smokeless Tobacco  . Never Used     Cardiac risk factors: advanced age (older than 52 for men, 33 for women), dyslipidemia and male gender.  Depression Screen  Depression screen PHQ 2/9 07/11/2016  Decreased Interest 0  Down, Depressed, Hopeless 0  PHQ - 2 Score 0     Activities of Daily Living In your present state of health, do you have any difficulty performing the following activities?:  Driving? No Managing money?  No Feeding yourself? No Getting from bed to chair? No Climbing a flight of stairs? No Preparing food and eating?: No Bathing or showering? No Getting dressed: No Getting to the toilet? No Using the toilet:  No Moving around from place to place: No In the past year have you fallen or had a near fall?:No   Home Safety Has smoke detector and wears seat belts. No firearms. No excess sun exposure. Are there smokers in your home (other than you)?  No Do you feel safe at home?  Yes  Hearing Difficulties: No Do you often ask people to speak up or repeat themselves? No Do you experience ringing or noises in your ears? No  Do you have difficulty understanding soft or whispered voices? No    Cognitive Testing  Alert? Yes   Normal Appearance? Yes  Oriented to person? Yes  Place? Yes   Time? Yes  Recall of three objects?  Yes  Can perform simple calculations? Yes  Displays appropriate judgment? Yes  Can read the correct time from a watch face? Yes  Do you feel that you have a problem with memory? No  Do you often misplace items? No   Advanced Directives have been discussed with the patient? Yes   Current Physicians/Providers and Suppliers  1. Terri Piedra, FNP - Internal Medicine 2. Lyman Bishop, MD - Cardiology  Indicate any recent Medical Services you may have received from other than [facility] providers in the past year (date may be approximate).  All answers were reviewed with the patient and necessary referrals were made:  Mauricio Po, [place]   07/11/2016  Allergies  Allergen Reactions  . Ofloxacin     REACTION: rash     Outpatient Medications Prior to Visit  Medication Sig Dispense Refill  . aspirin EC 81 MG tablet Take 81 mg by mouth daily.    Marland Kitchen atorvastatin (LIPITOR) 40 MG tablet Take 1 tablet (40 mg total) by mouth daily. 90 tablet 3  . nitroGLYCERIN (NITROSTAT) 0.4 MG SL tablet Place 1 tablet (0.4 mg total) under the tongue every 5 (five) minutes as needed for chest pain. Max 3 doses. 25 tablet 3  . prasugrel (EFFIENT) 10 MG TABS tablet Take 1 tablet (10 mg total) by mouth daily. 90 tablet 3  . azithromycin (ZITHROMAX) 250 MG tablet Take 2 tablets by mouth for 1 day then 1  tablet by mouth for 4 days. 6 tablet 0  . erythromycin ophthalmic ointment Place 1 application into the left eye 4 (four) times daily. 3.5 g 0  . predniSONE (DELTASONE) 10 MG tablet Start in 1 or 2 weeks. Prednisone 10 mg: take 4 tabs a day x 3 days; then 3 tabs a day x 4 days; then 2 tabs a day x 4 days, then 1 tab a day x 6 days, then stop. Take pc. 38 tablet 1   No facility-administered medications prior to visit.      Past Medical History:  Diagnosis Date  . Benign neoplasm of colon   . Finger fracture   . Hx of colonoscopy   . Internal hemorrhoids without mention of complication   . Myocardial infarction   . Plantar fascial fibromatosis   . Prostatitis   . Rib fracture      Past Surgical History:  Procedure Laterality Date  . COLONOSCOPY    . POLYPECTOMY    . TONSILLECTOMY    . VARICOCELECTOMY     Left     Family History  Problem Relation Age of Onset  . Heart disease Mother     ? CAD. died 14  . Stroke Father     at age 31, smoked cigar  . Diabetes Neg Hx   . Prostate cancer Neg Hx   . Colon cancer Neg Hx   . Stomach cancer Neg Hx   . Rectal cancer Neg Hx      Social History   Social History  . Marital status: Married    Spouse name: N/A  . Number of children: 2  . Years of education: N/A   Occupational History  . Retired    Social History Main Topics  . Smoking status: Former Smoker    Packs/day: 1.00    Years: 20.00    Types: Cigarettes    Quit date: 08/07/1978  . Smokeless tobacco: Never Used  . Alcohol use 15.0 oz/week    10 Cans of beer, 15 Standard drinks or equivalent per week  . Drug use: No  . Sexual activity: Not on file   Other Topics Concern  . 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 HENT: Head: Denies headache or neck pain Ears: Denies changes in hearing, ringing in ears, earache, drainage Nose: Denies discharge, stuffiness, itching, nosebleed, sinus pain Throat: Denies sore throat, hoarseness, dry mouth, sores, thrush Eyes: Denies loss/changes in vision, pain, redness, blurry/double vision, flashing lights Cardiovascular: Denies chest pain/discomfort, tightness, palpitations, shortness of breath with activity, difficulty lying down, swelling, sudden awakening with shortness of breath Respiratory: Denies shortness of breath, cough, sputum production, wheezing Gastrointestinal: Denies dysphasia, heartburn, change in appetite, nausea, change in bowel habits, rectal bleeding, constipation, diarrhea, yellow skin or eyes Genitourinary: Denies frequency, urgency, burning/pain, blood in urine, incontinence, change in urinary strength. Musculoskeletal: Denies muscle/joint pain, stiffness, back pain, redness or swelling of joints, trauma Skin: Denies rashes, lumps, itching, dryness, color changes, or hair/nail changes Neurological: Denies dizziness, fainting, seizures, weakness, numbness, tingling, tremor Psychiatric - Denies nervousness, stress, depression or memory loss Endocrine: Denies heat or cold intolerance, sweating, frequent urination, excessive thirst, changes in appetite Hematologic: Denies ease of bruising or bleeding    Objective:     BP 132/88 (BP Location: Left Arm, Patient Position: Sitting, Cuff Size: Normal)   Pulse (!) 53   Temp 97.7 F (36.5 C) (Oral)   Resp 16   Ht 6' (1.829 m)   Wt 194 lb (88 kg)   SpO2 98%   BMI 26.31 kg/m  Nursing note and vital signs reviewed.  Physical Exam  Constitutional: He is oriented to person, place, and time. He appears  well-developed and well-nourished. No distress.  Cardiovascular: Normal rate, regular rhythm, normal heart sounds and intact distal pulses.   Pulmonary/Chest: Effort normal and breath sounds normal.  Neurological: He is alert and oriented to person, place, and time.  Skin: Skin is warm and dry.  Psychiatric: He has a normal mood and affect. His behavior is normal. Judgment and thought content normal.       Assessment & Plan:   During the course of the visit the patient was educated and counseled about appropriate screening and preventive services including:    Pneumococcal vaccine   Influenza vaccine  Prostate cancer screening  Colorectal cancer screening  Nutrition counseling   Diet review for nutrition referral? Yes ____  Not Indicated _X___   Patient Instructions (the written plan) was given to the patient.  Medicare Attestation I have personally reviewed: The patient's medical and social history Their use of alcohol, tobacco or illicit drugs Their current medications and supplements The patient's functional ability including ADLs,fall risks, home safety risks, cognitive, and hearing and visual impairment Diet and physical activities Evidence for depression or mood disorders  The patient's weight, height, BMI,  have been recorded in the chart.  I have made referrals, counseling, and provided education to the patient based on review of the above and I have provided the patient with a written personalized care plan for preventive services.     Problem List Items Addressed This Visit      Cardiovascular and Mediastinum   CAD (coronary artery disease)    Coronary artery disease currently monitor through risk factor management. Hyperlipidemia well controlled with atorvastatin. Blood pressure appears well controlled. Continue to monitor and follow up with cardiology as scheduled.       Relevant Orders   CBC (Completed)   Comp Met (CMET) (Completed)     Other    Hyperlipidemia    Hyperlipidemia appears stable with atorvastatin and no myalgias or adverse side effects. Continue  current dosage of atorvastatin.       Medicare annual wellness visit, subsequent - Primary    Reviewed and updated patient's medical, surgical, family and social history. Medications and allergies were also reviewed. Basic screenings for depression, activities of daily living, hearing, cognition and safety were performed. Provider list was updated and health plan was provided to the patient.   All immunizations and screenings are up-to-date recommendations.       Other Visit Diagnoses    Special screening, prostate cancer       Relevant Orders   PSA, Medicare (Completed)       I have discontinued Mr. Caperton's predniSONE, erythromycin, and azithromycin. I am also having him maintain his aspirin EC, atorvastatin, nitroGLYCERIN, and prasugrel.   Follow-up: Return if symptoms worsen or fail to improve.   Mauricio Po, FNP

## 2016-07-11 NOTE — Assessment & Plan Note (Signed)
Reviewed and updated patient's medical, surgical, family and social history. Medications and allergies were also reviewed. Basic screenings for depression, activities of daily living, hearing, cognition and safety were performed. Provider list was updated and health plan was provided to the patient.   All immunizations and screenings are up-to-date recommendations.

## 2016-07-14 DIAGNOSIS — C44319 Basal cell carcinoma of skin of other parts of face: Secondary | ICD-10-CM | POA: Diagnosis not present

## 2016-08-11 ENCOUNTER — Ambulatory Visit (INDEPENDENT_AMBULATORY_CARE_PROVIDER_SITE_OTHER): Payer: PPO | Admitting: Internal Medicine

## 2016-08-11 ENCOUNTER — Other Ambulatory Visit: Payer: Self-pay | Admitting: Internal Medicine

## 2016-08-11 ENCOUNTER — Encounter: Payer: Self-pay | Admitting: Internal Medicine

## 2016-08-11 VITALS — BP 142/88 | HR 56 | Ht 73.0 in | Wt 196.0 lb

## 2016-08-11 DIAGNOSIS — Z955 Presence of coronary angioplasty implant and graft: Secondary | ICD-10-CM | POA: Diagnosis not present

## 2016-08-11 DIAGNOSIS — I2583 Coronary atherosclerosis due to lipid rich plaque: Secondary | ICD-10-CM

## 2016-08-11 DIAGNOSIS — E782 Mixed hyperlipidemia: Secondary | ICD-10-CM | POA: Diagnosis not present

## 2016-08-11 DIAGNOSIS — I2510 Atherosclerotic heart disease of native coronary artery without angina pectoris: Secondary | ICD-10-CM | POA: Diagnosis not present

## 2016-08-11 LAB — LIPID PANEL
CHOLESTEROL: 129 mg/dL (ref <200)
HDL: 54 mg/dL (ref >40)
LDL Cholesterol: 65 mg/dL (ref <100)
TRIGLYCERIDES: 49 mg/dL (ref <150)
Total CHOL/HDL Ratio: 2.4 Ratio (ref <5.0)
VLDL: 10 mg/dL (ref <30)

## 2016-08-11 LAB — COMPREHENSIVE METABOLIC PANEL
ALBUMIN: 4 g/dL (ref 3.6–5.1)
ALK PHOS: 46 U/L (ref 40–115)
ALT: 32 U/L (ref 9–46)
AST: 24 U/L (ref 10–35)
BUN: 21 mg/dL (ref 7–25)
CHLORIDE: 106 mmol/L (ref 98–110)
CO2: 27 mmol/L (ref 20–31)
CREATININE: 0.81 mg/dL (ref 0.70–1.18)
Calcium: 8.6 mg/dL (ref 8.6–10.3)
Glucose, Bld: 93 mg/dL (ref 65–99)
Potassium: 4.8 mmol/L (ref 3.5–5.3)
SODIUM: 140 mmol/L (ref 135–146)
TOTAL PROTEIN: 6.3 g/dL (ref 6.1–8.1)
Total Bilirubin: 0.8 mg/dL (ref 0.2–1.2)

## 2016-08-11 MED ORDER — LISINOPRIL 2.5 MG PO TABS: 2.5000 mg | ORAL_TABLET | Freq: Every day | ORAL | 3 refills | Status: DC

## 2016-08-11 NOTE — Progress Notes (Signed)
OFFICE NOTE  Chief Complaint:  Follow-up  Primary Care Physician: Mauricio Po, FNP  HPI:  Billy [name] is a pleasant 76 year old male who unfortunately had a recent anterior MI. He was doing some skiing up in the [facility] and had some chest discomfort. This worsened over the period of 1-2 days and then he presented to the emergency department at [facility]. Subsequently he was found to have a small elevation in troponin consistent with non-ST elevation MI. He was started on heparin aspirin and Lipitor and cardiology was consult it. He underwent cardiac catheterization by Dr. Beryle Flock, which demonstrated a 90% ruptured plaque in the LAD that was visualized by either [place]. He underwent placement of a 3.0 mm x 22 mm integrity resolution drug-eluting stent. This reduced the 90% lesion to 0 and TIMI-3 flow was noted. He was placed on aspirin and Brilinta and felt much better after discharge. He was also placed on metoprolol 25 mg twice a day. Today he returns in follow-up. He has a myriad of questions for me. Most have to do with follow-up, medications, and limitations. He denies any chest pain or worsening shortness of breath since his discharge. Blood pressure stable today 126/82 however heart rate is low at 45.  Billy [name] returns today for follow-up. He started cardiac rehabilitation but is noticed that he gets short of breath particularly with exercise. I received telemetry strips from cardiac rehabilitation which showed significant bradycardia with heart rates in the 40s which was a sinus rhythm. He is noted that when he exercises heart rate rarely gets much above the 80s. This indicates a flat heart rate response to exercise and could suggest a degree of chronotropic incompetence. Billy [name] noted that he had a low heart rate most of his life and was placed on low-dose beta blocker after his MI. Initially was 25 mg twice a day which I reduced to 12-1/2 mg twice a day.  Heart rate at rest today is 50 which is slightly higher than it had been previously. Blood pressure accordingly is gone up some to 132/85. In addition one is concerned about possible shortness of breath related to Brilinta. He was advised to try some caffeine after taking the medicine but noted no difference in his shortness of breath. He denies any angina.  12/10/2015  Billy [name] was seen back in the office today for follow-up. At his last office visit I stopped his Brilinta switched him over to Effient. We also discontinued his beta blocker. He notes a marked improvement of the symptoms in exercise tolerance. Heart rate has correspondingly increased better with exercise. His shortness of breath is totally resolved. Generally feels excellent. Blood pressures been very well controlled even though he is on no blood pressure medications between 161 and 096 systolic. There is not necessarily been enough room to add low-dose ACE inhibitor.  08/11/2016  Billy [name] returns today for follow-up. He says he is feeling well. He denies any chest pain or worsening shortness of breath. He's interested in going skiing. Is a little hesitant because his last MI was associated with skiing about a year ago. Actually his MI was on 09/01/2015, and I advised him that he can discontinue his Effient on that date. He will need to remain on aspirin and atorvastatin. Recent lipid testing showed excellent control with LDL of 52. Particle number less than 1000. He is due for repeat lipid profile. We appreciate discussed a low-dose ACE inhibitor or ARB because of his  history of coronary disease. Beta blocker is not likely to be tolerated due to low heart rate at rest.  PMHx:  Past Medical History:  Diagnosis Date  . Benign neoplasm of colon   . Finger fracture   . Hx of colonoscopy   . Internal hemorrhoids without mention of complication   . Myocardial infarction   . Plantar fascial fibromatosis   . Prostatitis   . Rib  fracture     Past Surgical History:  Procedure Laterality Date  . COLONOSCOPY    . POLYPECTOMY    . TONSILLECTOMY    . VARICOCELECTOMY     Left    FAMHx:  Family History  Problem Relation Age of Onset  . Heart disease Mother     ? CAD. died 23  . Stroke Father     at age 3, smoked cigar  . Diabetes Neg Hx   . Prostate cancer Neg Hx   . Colon cancer Neg Hx   . Stomach cancer Neg Hx   . Rectal cancer Neg Hx     SOCHx:   reports that he quit smoking about 38 years ago. His smoking use included Cigarettes. He has a 20.00 pack-year smoking history. He has never used smokeless tobacco. He reports that he drinks about 15.0 oz of alcohol per week . He reports that he does not use drugs.  ALLERGIES:  Allergies  Allergen Reactions  . Ofloxacin     REACTION: rash    ROS: Pertinent items noted in HPI and remainder of comprehensive ROS otherwise negative.  HOME MEDS: Current Outpatient Prescriptions  Medication Sig Dispense Refill  . aspirin EC 81 MG tablet Take 81 mg by mouth daily.    Marland Kitchen atorvastatin (LIPITOR) 40 MG tablet Take 1 tablet (40 mg total) by mouth daily. 90 tablet 3  . nitroGLYCERIN (NITROSTAT) 0.4 MG SL tablet Place 1 tablet (0.4 mg total) under the tongue every 5 (five) minutes as needed for chest pain. Max 3 doses. 25 tablet 3  . lisinopril (PRINIVIL,ZESTRIL) 2.5 MG tablet Take 1 tablet (2.5 mg total) by mouth daily. 90 tablet 3   No current facility-administered medications for this visit.     LABS/IMAGING: No results found for this or any previous visit (from the past 48 hour(s)). No results found.  WEIGHTS: Wt Readings from Last 3 Encounters:  08/11/16 196 lb (88.9 kg)  07/11/16 194 lb (88 kg)  04/18/16 187 lb (84.8 kg)    VITALS: BP (!) 142/88   Pulse (!) 56   Ht 6' 1"  (1.854 m)   Wt 196 lb (88.9 kg)   BMI 25.86 kg/m   EXAM: General appearance: alert and no distress Lungs: clear to auscultation bilaterally Heart: regular rate and rhythm,  S1, S2 normal, no murmur, click, rub or gallop Extremities: extremities normal, atraumatic, no cyanosis or edema Neurologic: Grossly normal  EKG: Sinus bradycardia 56  ASSESSMENT: 1. CAD status post DES to the proximal LAD for non-STEMI (08/2015) 2. Dyslipidemia 3. Anxiety 4. Chronotropic incompetence- off beta blocker d/t this 5. Dyspnea on exertion - improved on Effient  PLAN: 1.   Billy [name] is doing extremely well. I believe he can discontinue his Effient as of 08/31/2016. He will remain on lifelong aspirin. He is on Lipitor with excellent cholesterol control. We will recheck a resting lipid profile. Finally, he should be on low dose ACE or ARB given his coronary history. Blood pressure will not tolerate a higher dose of medication therefore will start  lisinopril 2.5 mg daily. He was counseled about a 50% risk of cough and less than half a percent risk of angioedema with the medication.   Follow-up with me annually or sooner as necessary.   Pixie Casino, MD, [facility] Attending Cardiologist [place] 08/11/2016, 1:31 PM

## 2016-08-11 NOTE — Patient Instructions (Signed)
Medication Instructions:  Stop the effient on 08/31/16. Start new prescription written today for lisinopril.   Labwork: C-met, lipid today.    Testing/Procedures:  NONE  Follow-Up: Your physician wants you to follow-up in: 1 YEAR OR SOONER IF NEEDED. You will receive a reminder letter in the mail two months in advance. If you don't receive a letter, please call our office to schedule the follow-up appointment.    Any Other Special Instructions Will Be Listed Below (If Applicable).

## 2016-08-14 ENCOUNTER — Encounter: Payer: Self-pay | Admitting: Internal Medicine

## 2016-08-14 NOTE — Addendum Note (Signed)
Addended by: Fidel Levy on: 08/14/2016 01:41 PM   Modules accepted: Orders

## 2016-08-14 NOTE — Progress Notes (Signed)
Labs excellent - LDL-C cholesterol at goal of 65 (was 133 last year). No changes.  Dr. Lemmie Evens

## 2016-09-12 ENCOUNTER — Other Ambulatory Visit: Payer: Self-pay | Admitting: Physician Assistant

## 2016-09-12 DIAGNOSIS — C44311 Basal cell carcinoma of skin of nose: Secondary | ICD-10-CM | POA: Diagnosis not present

## 2016-09-12 DIAGNOSIS — L570 Actinic keratosis: Secondary | ICD-10-CM | POA: Diagnosis not present

## 2016-11-15 DIAGNOSIS — C44311 Basal cell carcinoma of skin of nose: Secondary | ICD-10-CM | POA: Diagnosis not present

## 2017-06-15 ENCOUNTER — Other Ambulatory Visit: Payer: Self-pay | Admitting: *Deleted

## 2017-06-15 DIAGNOSIS — Z79899 Other long term (current) drug therapy: Secondary | ICD-10-CM

## 2017-06-15 DIAGNOSIS — E782 Mixed hyperlipidemia: Secondary | ICD-10-CM

## 2017-07-20 ENCOUNTER — Ambulatory Visit (INDEPENDENT_AMBULATORY_CARE_PROVIDER_SITE_OTHER): Payer: PPO | Admitting: Internal Medicine

## 2017-07-20 ENCOUNTER — Encounter: Payer: Self-pay | Admitting: Internal Medicine

## 2017-07-20 ENCOUNTER — Other Ambulatory Visit (INDEPENDENT_AMBULATORY_CARE_PROVIDER_SITE_OTHER): Payer: PPO

## 2017-07-20 DIAGNOSIS — Z0000 Encounter for general adult medical examination without abnormal findings: Secondary | ICD-10-CM

## 2017-07-20 LAB — CBC WITH DIFFERENTIAL/PLATELET
Basophils Absolute: 0.1 10*3/uL (ref 0.0–0.1)
Basophils Relative: 1 % (ref 0.0–3.0)
EOS PCT: 1.3 % (ref 0.0–5.0)
Eosinophils Absolute: 0.1 10*3/uL (ref 0.0–0.7)
HCT: 44.9 % (ref 39.0–52.0)
HEMOGLOBIN: 14.8 g/dL (ref 13.0–17.0)
Lymphocytes Relative: 29.3 % (ref 12.0–46.0)
Lymphs Abs: 2.1 10*3/uL (ref 0.7–4.0)
MCHC: 33 g/dL (ref 30.0–36.0)
MCV: 94.9 fl (ref 78.0–100.0)
MONOS PCT: 10.6 % (ref 3.0–12.0)
Monocytes Absolute: 0.8 10*3/uL (ref 0.1–1.0)
Neutro Abs: 4.2 10*3/uL (ref 1.4–7.7)
Neutrophils Relative %: 57.8 % (ref 43.0–77.0)
Platelets: 266 10*3/uL (ref 150.0–400.0)
RBC: 4.74 Mil/uL (ref 4.22–5.81)
RDW: 13.4 % (ref 11.5–15.5)
WBC: 7.2 10*3/uL (ref 4.0–10.5)

## 2017-07-20 LAB — BASIC METABOLIC PANEL
BUN: 20 mg/dL (ref 6–23)
CALCIUM: 8.8 mg/dL (ref 8.4–10.5)
CO2: 28 mEq/L (ref 19–32)
CREATININE: 0.93 mg/dL (ref 0.40–1.50)
Chloride: 105 mEq/L (ref 96–112)
GFR: 83.89 mL/min (ref >60.00)
GLUCOSE: 100 mg/dL — AB (ref 70–99)
Potassium: 4.4 mEq/L (ref 3.5–5.1)
SODIUM: 139 meq/L (ref 135–145)

## 2017-07-20 LAB — URINALYSIS, ROUTINE W REFLEX MICROSCOPIC
Bilirubin Urine: NEGATIVE
Hgb urine dipstick: NEGATIVE
Ketones, ur: NEGATIVE
Leukocytes, UA: NEGATIVE
Nitrite: NEGATIVE
RBC / HPF: NONE SEEN (ref 0–?)
SPECIFIC GRAVITY, URINE: 1.025 (ref 1.000–1.030)
Total Protein, Urine: NEGATIVE
Urine Glucose: NEGATIVE
Urobilinogen, UA: 0.2 (ref 0.0–1.0)
pH: 6 (ref 5.0–8.0)

## 2017-07-20 LAB — HEPATIC FUNCTION PANEL
ALBUMIN: 4.1 g/dL (ref 3.5–5.2)
ALT: 20 U/L (ref 0–53)
AST: 19 U/L (ref 0–37)
Alkaline Phosphatase: 48 U/L (ref 39–117)
BILIRUBIN TOTAL: 0.8 mg/dL (ref 0.2–1.2)
Bilirubin, Direct: 0.1 mg/dL (ref 0.0–0.3)
Total Protein: 6.6 g/dL (ref 6.0–8.3)

## 2017-07-20 LAB — LIPID PANEL
Cholesterol: 135 mg/dL (ref 0–200)
HDL: 54.9 mg/dL (ref >39.00)
LDL Cholesterol: 69 mg/dL (ref 0–99)
NONHDL: 80.57
Total CHOL/HDL Ratio: 2
Triglycerides: 58 mg/dL (ref 0.0–149.0)
VLDL: 11.6 mg/dL (ref 0.0–40.0)

## 2017-07-20 LAB — TSH: TSH: 2.06 u[IU]/mL (ref 0.35–4.50)

## 2017-07-20 LAB — PSA: PSA: 2.09 ng/mL (ref 0.10–4.00)

## 2017-07-20 NOTE — Progress Notes (Signed)
Subjective:    Patient ID: Billy [name], male    DOB: Dec 11, 1940, 76 y.o.   MRN: [id number]  HPI  Here for wellness and f/u;  Overall doing ok;  Pt denies Chest pain, worsening SOB, DOE, wheezing, orthopnea, PND, worsening LE edema, palpitations, dizziness or syncope.  Pt denies neurological change such as new headache, facial or extremity weakness.  Pt denies polydipsia, polyuria, or low sugar symptoms. Pt states overall good compliance with treatment and medications, good tolerability, and has been trying to follow appropriate diet.  Pt denies worsening depressive symptoms, suicidal ideation or panic. No fever, night sweats, wt loss, loss of appetite, or other constitutional symptoms.  Pt states good ability with ADL's, has low fall risk, home safety reviewed and adequate, no other significant changes in hearing or vision, and active with exercise, and still snow skis on occasion.  No other interval hx Past Medical History:  Diagnosis Date  . Benign neoplasm of colon   . Finger fracture   . Hx of colonoscopy   . Internal hemorrhoids without mention of complication   . Myocardial infarction ([place])   . Plantar fascial fibromatosis   . Prostatitis   . Rib fracture    Past Surgical History:  Procedure Laterality Date  . COLONOSCOPY    . POLYPECTOMY    . TONSILLECTOMY    . VARICOCELECTOMY     Left    reports that he quit smoking about 38 years ago. His smoking use included cigarettes. He has a 20.00 pack-year smoking history. he has never used smokeless tobacco. He reports that he drinks about 15.0 oz of alcohol per week. He reports that he does not use drugs. family history includes Heart disease in his mother; Stroke in his father. Allergies  Allergen Reactions  . Ofloxacin     REACTION: rash   Current Outpatient Medications on File Prior to Visit  Medication Sig Dispense Refill  . aspirin EC 81 MG tablet Take 81 mg by mouth daily.    Marland Kitchen atorvastatin (LIPITOR) 40 MG tablet Take 1  tablet by mouth every day 90 tablet 3  . nitroGLYCERIN (NITROSTAT) 0.4 MG SL tablet Place 1 tablet (0.4 mg total) under the tongue every 5 (five) minutes as needed for chest pain. Max 3 doses. 25 tablet 3  . lisinopril (PRINIVIL,ZESTRIL) 2.5 MG tablet Take 1 tablet (2.5 mg total) by mouth daily. 90 tablet 3   No current facility-administered medications on file prior to visit.    Review of Systems Constitutional: Negative for other unusual diaphoresis, sweats, appetite or weight changes HENT: Negative for other worsening hearing loss, ear pain, facial swelling, mouth sores or neck stiffness.   Eyes: Negative for other worsening pain, redness or other visual disturbance.  Respiratory: Negative for other stridor or swelling Cardiovascular: Negative for other palpitations or other chest pain  Gastrointestinal: Negative for worsening diarrhea or loose stools, blood in stool, distention or other pain Genitourinary: Negative for hematuria, flank pain or other change in urine volume.  Musculoskeletal: Negative for myalgias or other joint swelling.  Skin: Negative for other color change, or other wound or worsening drainage.  Neurological: Negative for other syncope or numbness. Hematological: Negative for other adenopathy or swelling Psychiatric/Behavioral: Negative for hallucinations, other worsening agitation, SI, self-injury, or new decreased concentration All other system neg per pt    Objective:   Physical Exam BP 118/86   Pulse 60   Temp 97.6 F (36.4 C) (Oral)   Ht 6\' 1"  (  1.854 m)   Wt 193 lb (87.5 kg)   SpO2 98%   BMI 25.46 kg/m  VS noted,  Constitutional: Pt is oriented to person, place, and time. Appears well-developed and well-nourished, in no significant distress and comfortable Head: Normocephalic and atraumatic  Eyes: Conjunctivae and EOM are normal. Pupils are equal, round, and reactive to light Right Ear: External ear normal without discharge Left Ear: External ear  normal without discharge Nose: Nose without discharge or deformity Mouth/Throat: Oropharynx is without other ulcerations and moist  Neck: Normal range of motion. Neck supple. No JVD present. No tracheal deviation present or significant neck LA or mass Cardiovascular: Normal rate, regular rhythm, normal heart sounds and intact distal pulses.   Pulmonary/Chest: WOB normal and breath sounds without rales or wheezing  Abdominal: Soft. Bowel sounds are normal. NT. No HSM  Musculoskeletal: Normal range of motion. Exhibits no edema Lymphadenopathy: Has no other cervical adenopathy.  Neurological: Pt is alert and oriented to person, place, and time. Pt has normal reflexes. No cranial nerve deficit. Motor grossly intact, Gait intact Skin: Skin is warm and dry. No rash noted or new ulcerations Psychiatric:  Has normal mood and affect. Behavior is normal without agitation No other exam findings Lab Results  Component Value Date   WBC 7.2 07/20/2017   HGB 14.8 07/20/2017   HCT 44.9 07/20/2017   PLT 266.0 07/20/2017   GLUCOSE 100 (H) 07/20/2017   CHOL 135 07/20/2017   TRIG 58.0 07/20/2017   HDL 54.90 07/20/2017   LDLDIRECT 144.0 11/21/2010   LDLCALC 69 07/20/2017   ALT 20 07/20/2017   AST 19 07/20/2017   NA 139 07/20/2017   K 4.4 07/20/2017   CL 105 07/20/2017   CREATININE 0.93 07/20/2017   BUN 20 07/20/2017   CO2 28 07/20/2017   TSH 2.06 07/20/2017   PSA 2.09 07/20/2017       Assessment & Plan:

## 2017-07-20 NOTE — Patient Instructions (Signed)

## 2017-07-21 ENCOUNTER — Other Ambulatory Visit: Payer: Self-pay | Admitting: Internal Medicine

## 2017-07-22 NOTE — Assessment & Plan Note (Signed)

## 2017-07-23 NOTE — Telephone Encounter (Signed)
REFILL 

## 2017-08-13 ENCOUNTER — Ambulatory Visit: Payer: PPO | Admitting: Internal Medicine

## 2017-08-13 ENCOUNTER — Encounter: Payer: Self-pay | Admitting: Internal Medicine

## 2017-08-13 VITALS — BP 132/84 | HR 53 | Ht 72.0 in | Wt 193.2 lb

## 2017-08-13 DIAGNOSIS — Z955 Presence of coronary angioplasty implant and graft: Secondary | ICD-10-CM

## 2017-08-13 DIAGNOSIS — R194 Change in bowel habit: Secondary | ICD-10-CM | POA: Diagnosis not present

## 2017-08-13 DIAGNOSIS — I2510 Atherosclerotic heart disease of native coronary artery without angina pectoris: Secondary | ICD-10-CM | POA: Diagnosis not present

## 2017-08-13 DIAGNOSIS — E782 Mixed hyperlipidemia: Secondary | ICD-10-CM

## 2017-08-13 NOTE — Patient Instructions (Signed)
Metamucil + Water  Your physician wants you to follow-up in: ONE YEAR with Dr. Debara Pickett. You will receive a reminder letter in the mail two months in advance. If you don't receive a letter, please call our office to schedule the follow-up appointment.

## 2017-08-13 NOTE — Progress Notes (Signed)
OFFICE NOTE  Chief Complaint:  Follow-up  Primary Care Physician: Biagio Borg, MD  HPI:  Billy [name] is a pleasant 77 year old male who unfortunately had a recent anterior MI. He was doing some skiing up in the [facility] and had some chest discomfort. This worsened over the period of 1-2 days and then he presented to the emergency department at [facility]. Subsequently he was found to have a small elevation in troponin consistent with non-ST elevation MI. He was started on heparin aspirin and Lipitor and cardiology was consult it. He underwent cardiac catheterization by Dr. Beryle Flock, which demonstrated a 90% ruptured plaque in the LAD that was visualized by either [place]. He underwent placement of a 3.0 mm x 22 mm integrity resolution drug-eluting stent. This reduced the 90% lesion to 0 and TIMI-3 flow was noted. He was placed on aspirin and Brilinta and felt much better after discharge. He was also placed on metoprolol 25 mg twice a day. Today he returns in follow-up. He has a myriad of questions for me. Most have to do with follow-up, medications, and limitations. He denies any chest pain or worsening shortness of breath since his discharge. Blood pressure stable today 126/82 however heart rate is low at 45.  Billy [name] returns today for follow-up. He started cardiac rehabilitation but is noticed that he gets short of breath particularly with exercise. I received telemetry strips from cardiac rehabilitation which showed significant bradycardia with heart rates in the 40s which was a sinus rhythm. He is noted that when he exercises heart rate rarely gets much above the 80s. This indicates a flat heart rate response to exercise and could suggest a degree of chronotropic incompetence. Billy [name] noted that he had a low heart rate most of his life and was placed on low-dose beta blocker after his MI. Initially was 25 mg twice a day which I reduced to 12-1/2 mg twice a day.  Heart rate at rest today is 50 which is slightly higher than it had been previously. Blood pressure accordingly is gone up some to 132/85. In addition one is concerned about possible shortness of breath related to Brilinta. He was advised to try some caffeine after taking the medicine but noted no difference in his shortness of breath. He denies any angina.  12/10/2015  Billy [name] was seen back in the office today for follow-up. At his last office visit I stopped his Brilinta switched him over to Effient. We also discontinued his beta blocker. He notes a marked improvement of the symptoms in exercise tolerance. Heart rate has correspondingly increased better with exercise. His shortness of breath is totally resolved. Generally feels excellent. Blood pressures been very well controlled even though he is on no blood pressure medications between 854 and 627 systolic. There is not necessarily been enough room to add low-dose ACE inhibitor.  08/11/2016  Billy [name] returns today for follow-up. He says he is feeling well. He denies any chest pain or worsening shortness of breath. He's interested in going skiing. Is a little hesitant because his last MI was associated with skiing about a year ago. Actually his MI was on 09/01/2015, and I advised him that he can discontinue his Effient on that date. He will need to remain on aspirin and atorvastatin. Recent lipid testing showed excellent control with LDL of 52. Particle number less than 1000. He is due for repeat lipid profile. We appreciate discussed a low-dose ACE inhibitor or ARB because of  his history of coronary disease. Beta blocker is not likely to be tolerated due to low heart rate at rest.  08/13/2017  Billy [name] returns today for annual follow-up.  Over the past year he is done very well.  He denies any chest pain or worsening shortness of breath.  He is on minimal medications at this time.  His primary care provider recently repeated his labs 3 weeks ago  showed total cholesterol 135, triglycerides 58, HDL 55 and LDL 69.  His only other complaint today is some irregularity in bowel movements.  He is taking a probiotic.  We discussed some suggestions including adding a bulk fiber agent such as Metamucil to his diet and increasing his water intake.  PMHx:  Past Medical History:  Diagnosis Date  . Benign neoplasm of colon   . Finger fracture   . Hx of colonoscopy   . Internal hemorrhoids without mention of complication   . Myocardial infarction ([place])   . Plantar fascial fibromatosis   . Prostatitis   . Rib fracture     Past Surgical History:  Procedure Laterality Date  . COLONOSCOPY    . POLYPECTOMY    . TONSILLECTOMY    . VARICOCELECTOMY     Left    FAMHx:  Family History  Problem Relation Age of Onset  . Heart disease Mother        ? CAD. died 3  . Stroke Father        at age 65, smoked cigar  . Diabetes Neg Hx   . Prostate cancer Neg Hx   . Colon cancer Neg Hx   . Stomach cancer Neg Hx   . Rectal cancer Neg Hx     SOCHx:   reports that he quit smoking about 39 years ago. His smoking use included cigarettes. He has a 20.00 pack-year smoking history. he has never used smokeless tobacco. He reports that he drinks about 15.0 oz of alcohol per week. He reports that he does not use drugs.  ALLERGIES:  Allergies  Allergen Reactions  . Ofloxacin     REACTION: rash    ROS: Pertinent items noted in HPI and remainder of comprehensive ROS otherwise negative.  HOME MEDS: Current Outpatient Medications  Medication Sig Dispense Refill  . aspirin EC 81 MG tablet Take 81 mg by mouth daily.    Marland Kitchen atorvastatin (LIPITOR) 40 MG tablet Take 1 tablet (40 mg total) by mouth daily. KEEP OV. 90 tablet 0  . lisinopril (PRINIVIL,ZESTRIL) 2.5 MG tablet Take 1 tablet (2.5 mg total) by mouth daily. KEEP OV. 90 tablet 0  . nitroGLYCERIN (NITROSTAT) 0.4 MG SL tablet Place 1 tablet (0.4 mg total) under the tongue every 5 (five) minutes as  needed for chest pain. Max 3 doses. 25 tablet 3   No current facility-administered medications for this visit.     LABS/IMAGING: No results found for this or any previous visit (from the past 48 hour(s)). No results found.  WEIGHTS: Wt Readings from Last 3 Encounters:  08/13/17 193 lb 3.2 oz (87.6 kg)  07/20/17 193 lb (87.5 kg)  08/11/16 196 lb (88.9 kg)    VITALS: BP 132/84   Pulse (!) 53   Ht 6' (1.829 m)   Wt 193 lb 3.2 oz (87.6 kg)   BMI 26.20 kg/m   EXAM: General appearance: alert and no distress Neck: no carotid bruit, no JVD and thyroid not enlarged, symmetric, no tenderness/mass/nodules Lungs: clear to auscultation bilaterally Heart: regular rate and  rhythm, S1, S2 normal, no murmur, click, rub or gallop Abdomen: soft, non-tender; bowel sounds normal; no masses,  no organomegaly Extremities: extremities normal, atraumatic, no cyanosis or edema Pulses: 2+ and symmetric Skin: Skin color, texture, turgor normal. No rashes or lesions Neurologic: Grossly normal Psych: Pleasant  EKG: Sinus bradycardia 53-personally reviewed  ASSESSMENT: 1. CAD status post DES to the proximal LAD for non-STEMI (08/2015) 2. Dyslipidemia 3. Anxiety 4. H/o chronotropic incompetence- off beta blocker d/t this  PLAN: 1.   Billy [name] continues to be active without any chest pain or worsening shortness of breath.  His cholesterol is well controlled.  Blood pressure is at goal.  No changes to his medications at this time.  I have encouraged continued physical activity.  Follow-up with me annually or sooner as necessary.  Pixie Casino, MD, [facility], [place] Director of the Advanced Lipid Disorders &  Cardiovascular Risk Reduction Clinic Diplomate of the American Board of Clinical Lipidology Attending Cardiologist  Direct Dial: [phone]  Fax: [phone]  Website:  [email or web address] 08/13/2017, 9:32 AM

## 2017-08-15 DIAGNOSIS — H2513 Age-related nuclear cataract, bilateral: Secondary | ICD-10-CM | POA: Diagnosis not present

## 2017-08-15 DIAGNOSIS — H25013 Cortical age-related cataract, bilateral: Secondary | ICD-10-CM | POA: Diagnosis not present

## 2017-08-15 DIAGNOSIS — D3132 Benign neoplasm of left choroid: Secondary | ICD-10-CM | POA: Diagnosis not present

## 2017-11-07 ENCOUNTER — Other Ambulatory Visit: Payer: Self-pay | Admitting: Internal Medicine

## 2018-01-24 ENCOUNTER — Other Ambulatory Visit: Payer: Self-pay | Admitting: Physician Assistant

## 2018-01-24 DIAGNOSIS — L570 Actinic keratosis: Secondary | ICD-10-CM | POA: Diagnosis not present

## 2018-01-24 DIAGNOSIS — D045 Carcinoma in situ of skin of trunk: Secondary | ICD-10-CM | POA: Diagnosis not present

## 2018-01-24 DIAGNOSIS — D0439 Carcinoma in situ of skin of other parts of face: Secondary | ICD-10-CM | POA: Diagnosis not present

## 2018-03-12 ENCOUNTER — Telehealth: Payer: Self-pay | Admitting: Internal Medicine

## 2018-03-12 MED ORDER — NITROGLYCERIN 0.4 MG SL SUBL: 0.4000 mg | SUBLINGUAL_TABLET | SUBLINGUAL | 3 refills | Status: DC | PRN

## 2018-03-12 NOTE — Telephone Encounter (Signed)
°*  STAT* If patient is at the pharmacy, call can be transferred to refill team.   1. Which medications need to be refilled? (please list name of each medication and dose if known) Nitro  .4mg   2. Which pharmacy/location (including street and city if local pharmacy) is medication to be sent to?[facility]/fax [phone]   3. Do they need a 30 day or 90 day supply? [place]

## 2018-03-13 ENCOUNTER — Other Ambulatory Visit: Payer: Self-pay | Admitting: *Deleted

## 2018-03-13 ENCOUNTER — Telehealth: Payer: Self-pay | Admitting: Internal Medicine

## 2018-03-13 MED ORDER — NITROGLYCERIN 0.4 MG SL SUBL: 0.4000 mg | SUBLINGUAL_TABLET | SUBLINGUAL | 3 refills | Status: AC | PRN

## 2018-03-13 NOTE — Telephone Encounter (Signed)
New Message        How many nitroglycerin pills do they want sent to patient for a 90 day supply? Either call or resend.

## 2018-07-23 ENCOUNTER — Encounter: Payer: Self-pay | Admitting: Internal Medicine

## 2018-07-23 ENCOUNTER — Other Ambulatory Visit (INDEPENDENT_AMBULATORY_CARE_PROVIDER_SITE_OTHER): Payer: PPO

## 2018-07-23 ENCOUNTER — Ambulatory Visit (INDEPENDENT_AMBULATORY_CARE_PROVIDER_SITE_OTHER): Payer: PPO | Admitting: Internal Medicine

## 2018-07-23 VITALS — BP 124/78 | HR 55 | Temp 97.7°F | Ht 72.0 in | Wt 197.0 lb

## 2018-07-23 DIAGNOSIS — Z0000 Encounter for general adult medical examination without abnormal findings: Secondary | ICD-10-CM

## 2018-07-23 LAB — LIPID PANEL
CHOLESTEROL: 140 mg/dL (ref 0–200)
HDL: 54.2 mg/dL (ref >39.00)
LDL Cholesterol: 73 mg/dL (ref 0–99)
NonHDL: 86.1
Total CHOL/HDL Ratio: 3
Triglycerides: 68 mg/dL (ref 0.0–149.0)
VLDL: 13.6 mg/dL (ref 0.0–40.0)

## 2018-07-23 LAB — CBC WITH DIFFERENTIAL/PLATELET
BASOS PCT: 0.8 % (ref 0.0–3.0)
Basophils Absolute: 0.1 10*3/uL (ref 0.0–0.1)
EOS PCT: 2.7 % (ref 0.0–5.0)
Eosinophils Absolute: 0.2 10*3/uL (ref 0.0–0.7)
HCT: 42.2 % (ref 39.0–52.0)
Hemoglobin: 14.2 g/dL (ref 13.0–17.0)
Lymphocytes Relative: 33.1 % (ref 12.0–46.0)
Lymphs Abs: 2.3 10*3/uL (ref 0.7–4.0)
MCHC: 33.6 g/dL (ref 30.0–36.0)
MCV: 93.3 fl (ref 78.0–100.0)
Monocytes Absolute: 0.8 10*3/uL (ref 0.1–1.0)
Monocytes Relative: 11.5 % (ref 3.0–12.0)
NEUTROS ABS: 3.6 10*3/uL (ref 1.4–7.7)
Neutrophils Relative %: 51.9 % (ref 43.0–77.0)
PLATELETS: 252 10*3/uL (ref 150.0–400.0)
RBC: 4.52 Mil/uL (ref 4.22–5.81)
RDW: 13.1 % (ref 11.5–15.5)
WBC: 7 10*3/uL (ref 4.0–10.5)

## 2018-07-23 LAB — URINALYSIS, ROUTINE W REFLEX MICROSCOPIC
Bilirubin Urine: NEGATIVE
Hgb urine dipstick: NEGATIVE
Ketones, ur: NEGATIVE
Leukocytes, UA: NEGATIVE
Nitrite: NEGATIVE
RBC / HPF: NONE SEEN (ref 0–?)
SPECIFIC GRAVITY, URINE: 1.01 (ref 1.000–1.030)
Total Protein, Urine: NEGATIVE
Urine Glucose: NEGATIVE
Urobilinogen, UA: 0.2 (ref 0.0–1.0)
pH: 5 (ref 5.0–8.0)

## 2018-07-23 LAB — HEPATIC FUNCTION PANEL
ALT: 20 U/L (ref 0–53)
AST: 19 U/L (ref 0–37)
Albumin: 4.2 g/dL (ref 3.5–5.2)
Alkaline Phosphatase: 54 U/L (ref 39–117)
Bilirubin, Direct: 0.2 mg/dL (ref 0.0–0.3)
Total Bilirubin: 0.9 mg/dL (ref 0.2–1.2)
Total Protein: 6.5 g/dL (ref 6.0–8.3)

## 2018-07-23 LAB — BASIC METABOLIC PANEL
BUN: 21 mg/dL (ref 6–23)
CO2: 25 mEq/L (ref 19–32)
Calcium: 9 mg/dL (ref 8.4–10.5)
Chloride: 105 mEq/L (ref 96–112)
Creatinine, Ser: 0.9 mg/dL (ref 0.40–1.50)
GFR: 86.89 mL/min (ref >60.00)
Glucose, Bld: 102 mg/dL — ABNORMAL HIGH (ref 70–99)
Potassium: 4.5 mEq/L (ref 3.5–5.1)
Sodium: 137 mEq/L (ref 135–145)

## 2018-07-23 LAB — PSA: PSA: 2.85 ng/mL (ref 0.10–4.00)

## 2018-07-23 LAB — TSH: TSH: 2.45 u[IU]/mL (ref 0.35–4.50)

## 2018-07-23 NOTE — Progress Notes (Signed)
Subjective:    Patient ID: Billy [name], male    DOB: 07-22-1941, 77 y.o.   MRN: [id number]  HPI  Here for wellness and f/u;  Overall doing ok;  Pt denies Chest pain, worsening SOB, DOE, wheezing, orthopnea, PND, worsening LE edema, palpitations, dizziness or syncope.  Pt denies neurological change such as new headache, facial or extremity weakness.  Pt denies polydipsia, polyuria, or low sugar symptoms. Pt states overall good compliance with treatment and medications, good tolerability, and has been trying to follow appropriate diet.  Pt denies worsening depressive symptoms, suicidal ideation or panic. No fever, night sweats, wt loss, loss of appetite, or other constitutional symptoms.  Pt states good ability with ADL's, has low fall risk, home safety reviewed and adequate, no other significant changes in hearing or vision, and only occasionally active with exercise. Walks about 10-15K steps per day.  No new complaints Past Medical History:  Diagnosis Date  . Benign neoplasm of colon   . Finger fracture   . Hx of colonoscopy   . Internal hemorrhoids without mention of complication   . Myocardial infarction ([place])   . Plantar fascial fibromatosis   . Prostatitis   . Rib fracture    Past Surgical History:  Procedure Laterality Date  . COLONOSCOPY    . POLYPECTOMY    . TONSILLECTOMY    . VARICOCELECTOMY     Left    reports that he quit smoking about 39 years ago. His smoking use included cigarettes. He has a 20.00 pack-year smoking history. He has never used smokeless tobacco. He reports current alcohol use of about 25.0 standard drinks of alcohol per week. He reports that he does not use drugs. family history includes Heart disease in his mother; Stroke in his father. Allergies  Allergen Reactions  . Ofloxacin     REACTION: rash   Current Outpatient Medications on File Prior to Visit  Medication Sig Dispense Refill  . aspirin EC 81 MG tablet Take 81 mg by mouth daily.    Marland Kitchen  atorvastatin (LIPITOR) 40 MG tablet Take 1 tablet by mouth daily (keep office visit) 90 tablet 3  . lisinopril (PRINIVIL,ZESTRIL) 2.5 MG tablet Take 1 tablet by mouth daily (keep office visit) 90 tablet 3  . nitroGLYCERIN (NITROSTAT) 0.4 MG SL tablet Place 1 tablet (0.4 mg total) under the tongue every 5 (five) minutes as needed for chest pain. Max 3 doses. 25 tablet 3   No current facility-administered medications on file prior to visit.    Review of Systems Constitutional: Negative for other unusual diaphoresis, sweats, appetite or weight changes HENT: Negative for other worsening hearing loss, ear pain, facial swelling, mouth sores or neck stiffness.   Eyes: Negative for other worsening pain, redness or other visual disturbance.  Respiratory: Negative for other stridor or swelling Cardiovascular: Negative for other palpitations or other chest pain  Gastrointestinal: Negative for worsening diarrhea or loose stools, blood in stool, distention or other pain Genitourinary: Negative for hematuria, flank pain or other change in urine volume.  Musculoskeletal: Negative for myalgias or other joint swelling.  Skin: Negative for other color change, or other wound or worsening drainage.  Neurological: Negative for other syncope or numbness. Hematological: Negative for other adenopathy or swelling Psychiatric/Behavioral: Negative for hallucinations, other worsening agitation, SI, self-injury, or new decreased concentration All other system neg per pt    Objective:   Physical Exam BP 124/78   Pulse (!) 55   Temp 97.7 F (36.5 C) (  Oral)   Ht 6' (1.829 m)   Wt 197 lb (89.4 kg)   SpO2 96%   BMI 26.72 kg/m  VS noted,  Constitutional: Pt is oriented to person, place, and time. Appears well-developed and well-nourished, in no significant distress and comfortable Head: Normocephalic and atraumatic  Eyes: Conjunctivae and EOM are normal. Pupils are equal, round, and reactive to light Right Ear:  External ear normal without discharge Left Ear: External ear normal without discharge Nose: Nose without discharge or deformity Mouth/Throat: Oropharynx is without other ulcerations and moist  Neck: Normal range of motion. Neck supple. No JVD present. No tracheal deviation present or significant neck LA or mass Cardiovascular: Normal rate, regular rhythm, normal heart sounds and intact distal pulses.   Pulmonary/Chest: WOB normal and breath sounds without rales or wheezing  Abdominal: Soft. Bowel sounds are normal. NT. No HSM  Musculoskeletal: Normal range of motion. Exhibits no edema Lymphadenopathy: Has no other cervical adenopathy.  Neurological: Pt is alert and oriented to person, place, and time. Pt has normal reflexes. No cranial nerve deficit. Motor grossly intact, Gait intact Skin: Skin is warm and dry. No rash noted or new ulcerations Psychiatric:  Has normal mood and affect. Behavior is normal without agitation No other exam findings Lab Results  Component Value Date   WBC 7.2 07/20/2017   HGB 14.8 07/20/2017   HCT 44.9 07/20/2017   PLT 266.0 07/20/2017   GLUCOSE 100 (H) 07/20/2017   CHOL 135 07/20/2017   TRIG 58.0 07/20/2017   HDL 54.90 07/20/2017   LDLDIRECT 144.0 11/21/2010   LDLCALC 69 07/20/2017   ALT 20 07/20/2017   AST 19 07/20/2017   NA 139 07/20/2017   K 4.4 07/20/2017   CL 105 07/20/2017   CREATININE 0.93 07/20/2017   BUN 20 07/20/2017   CO2 28 07/20/2017   TSH 2.06 07/20/2017   PSA 2.09 07/20/2017       Assessment & Plan:

## 2018-07-23 NOTE — Patient Instructions (Signed)
Please continue all other medications as before, and refills have been done if requested.  Please have the pharmacy call with any other refills you may need.  Please continue your efforts at being more active, low cholesterol diet, and weight control.  You are otherwise up to date with prevention measures today.  Please keep your appointments with your specialists as you may have planned  Please go to the LAB in the Basement (turn left off the elevator) for the tests to be done today  You will be contacted by phone if any changes need to be made immediately.  Otherwise, you will receive a letter about your results with an explanation, but please check with MyChart first  Please return in 1 year for your yearly visit, or sooner if needed, with Lab testing done 3-5 days before  

## 2018-07-23 NOTE — Assessment & Plan Note (Signed)

## 2018-08-19 ENCOUNTER — Encounter: Payer: Self-pay | Admitting: Internal Medicine

## 2018-08-19 ENCOUNTER — Ambulatory Visit: Payer: PPO | Admitting: Internal Medicine

## 2018-08-19 VITALS — BP 136/84 | HR 51 | Ht 72.0 in | Wt 197.8 lb

## 2018-08-19 DIAGNOSIS — E782 Mixed hyperlipidemia: Secondary | ICD-10-CM | POA: Diagnosis not present

## 2018-08-19 DIAGNOSIS — I2510 Atherosclerotic heart disease of native coronary artery without angina pectoris: Secondary | ICD-10-CM | POA: Diagnosis not present

## 2018-08-19 NOTE — Patient Instructions (Signed)
Medication Instructions:  Continue current medications If you need a refill on your cardiac medications before your next appointment, please call your pharmacy.   Follow-Up: At [facility], you and your health needs are our priority.  As part of our continuing mission to provide you with exceptional heart care, we have created designated Provider Care Teams.  These Care Teams include your primary Cardiologist (physician) and Advanced Practice Providers (APPs -  Physician Assistants and Nurse Practitioners) who all work together to provide you with the care you need, when you need it. You will need a follow up appointment in 12 months.  Please call our office 2 months in advance to schedule this appointment.  You may see Kenneth C Hilty, MD or one of the following Advanced Practice Providers on your designated Care Team: Hao Meng, PA-C . Angela Duke, PA-C  Any Other Special Instructions Will Be Listed Below (If Applicable).    

## 2018-08-19 NOTE — Progress Notes (Signed)
OFFICE NOTE  Chief Complaint:  Annual follow-up  Primary Care Physician: Biagio Borg, MD  HPI:  Billy [name] is a pleasant 78 year old male who unfortunately had a recent anterior MI. He was doing some skiing up in the [facility] and had some chest discomfort. This worsened over the period of 1-2 days and then he presented to the emergency department at [facility]. Subsequently he was found to have a small elevation in troponin consistent with non-ST elevation MI. He was started on heparin aspirin and Lipitor and cardiology was consult it. He underwent cardiac catheterization by Dr. Beryle Flock, which demonstrated a 90% ruptured plaque in the LAD that was visualized by either [place]. He underwent placement of a 3.0 mm x 22 mm integrity resolution drug-eluting stent. This reduced the 90% lesion to 0 and TIMI-3 flow was noted. He was placed on aspirin and Brilinta and felt much better after discharge. He was also placed on metoprolol 25 mg twice a day. Today he returns in follow-up. He has a myriad of questions for me. Most have to do with follow-up, medications, and limitations. He denies any chest pain or worsening shortness of breath since his discharge. Blood pressure stable today 126/82 however heart rate is low at 45.  Billy [name] returns today for follow-up. He started cardiac rehabilitation but is noticed that he gets short of breath particularly with exercise. I received telemetry strips from cardiac rehabilitation which showed significant bradycardia with heart rates in the 40s which was a sinus rhythm. He is noted that when he exercises heart rate rarely gets much above the 80s. This indicates a flat heart rate response to exercise and could suggest a degree of chronotropic incompetence. Billy [name] noted that he had a low heart rate most of his life and was placed on low-dose beta blocker after his MI. Initially was 25 mg twice a day which I reduced to 12-1/2 mg twice a  day. Heart rate at rest today is 50 which is slightly higher than it had been previously. Blood pressure accordingly is gone up some to 132/85. In addition one is concerned about possible shortness of breath related to Brilinta. He was advised to try some caffeine after taking the medicine but noted no difference in his shortness of breath. He denies any angina.  12/10/2015  Billy [name] was seen back in the office today for follow-up. At his last office visit I stopped his Brilinta switched him over to Effient. We also discontinued his beta blocker. He notes a marked improvement of the symptoms in exercise tolerance. Heart rate has correspondingly increased better with exercise. His shortness of breath is totally resolved. Generally feels excellent. Blood pressures been very well controlled even though he is on no blood pressure medications between 297 and 989 systolic. There is not necessarily been enough room to add low-dose ACE inhibitor.  08/11/2016  Billy [name] returns today for follow-up. He says he is feeling well. He denies any chest pain or worsening shortness of breath. He's interested in going skiing. Is a little hesitant because his last MI was associated with skiing about a year ago. Actually his MI was on 09/01/2015, and I advised him that he can discontinue his Effient on that date. He will need to remain on aspirin and atorvastatin. Recent lipid testing showed excellent control with LDL of 52. Particle number less than 1000. He is due for repeat lipid profile. We appreciate discussed a low-dose ACE inhibitor or ARB because  of his history of coronary disease. Beta blocker is not likely to be tolerated due to low heart rate at rest.  08/13/2017  Billy [name] returns today for annual follow-up.  Over the past year he is done very well.  He denies any chest pain or worsening shortness of breath.  He is on minimal medications at this time.  His primary care provider recently repeated his labs 3 weeks  ago showed total cholesterol 135, triglycerides 58, HDL 55 and LDL 69.  His only other complaint today is some irregularity in bowel movements.  He is taking a probiotic.  We discussed some suggestions including adding a bulk fiber agent such as Metamucil to his diet and increasing his water intake.  08/19/2018  Billy [name] is seen today in annual follow-up. He is very active, climbing ladders, doing housework and gets usually 10,000 steps/day. He denies chest pain, dyspnea or associated symptoms.  He recently saw his PCP about a few weeks ago.  Lab work indicated total cholesterol 140, triglycerides 68, HDL 54 and LDL 73.  Overall he is well without complaints.  PMHx:  Past Medical History:  Diagnosis Date  . Benign neoplasm of colon   . Finger fracture   . Hx of colonoscopy   . Internal hemorrhoids without mention of complication   . Myocardial infarction ([place])   . Plantar fascial fibromatosis   . Prostatitis   . Rib fracture     Past Surgical History:  Procedure Laterality Date  . COLONOSCOPY    . POLYPECTOMY    . TONSILLECTOMY    . VARICOCELECTOMY     Left    FAMHx:  Family History  Problem Relation Age of Onset  . Heart disease Mother        ? CAD. died 14  . Stroke Father        at age 65, smoked cigar  . Diabetes Neg Hx   . Prostate cancer Neg Hx   . Colon cancer Neg Hx   . Stomach cancer Neg Hx   . Rectal cancer Neg Hx     SOCHx:   reports that he quit smoking about 40 years ago. His smoking use included cigarettes. He has a 20.00 pack-year smoking history. He has never used smokeless tobacco. He reports current alcohol use of about 25.0 standard drinks of alcohol per week. He reports that he does not use drugs.  ALLERGIES:  Allergies  Allergen Reactions  . Ofloxacin     REACTION: rash    ROS: Pertinent items noted in HPI and remainder of comprehensive ROS otherwise negative.  HOME MEDS: Current Outpatient Medications  Medication Sig Dispense Refill  .  aspirin EC 81 MG tablet Take 81 mg by mouth daily.    Marland Kitchen atorvastatin (LIPITOR) 40 MG tablet Take 1 tablet by mouth daily (keep office visit) 90 tablet 3  . lisinopril (PRINIVIL,ZESTRIL) 2.5 MG tablet Take 1 tablet by mouth daily (keep office visit) 90 tablet 3  . nitroGLYCERIN (NITROSTAT) 0.4 MG SL tablet Place 1 tablet (0.4 mg total) under the tongue every 5 (five) minutes as needed for chest pain. Max 3 doses. 25 tablet 3   No current facility-administered medications for this visit.     LABS/IMAGING: No results found for this or any previous visit (from the past 48 hour(s)). No results found.  WEIGHTS: Wt Readings from Last 3 Encounters:  08/19/18 197 lb 12.8 oz (89.7 kg)  07/23/18 197 lb (89.4 kg)  08/13/17 193 lb 3.2  oz (87.6 kg)    VITALS: BP 136/84   Pulse (!) 51   Ht 6' (1.829 m)   Wt 197 lb 12.8 oz (89.7 kg)   BMI 26.83 kg/m   EXAM: General appearance: alert and no distress Neck: no carotid bruit, no JVD and thyroid not enlarged, symmetric, no tenderness/mass/nodules Lungs: clear to auscultation bilaterally Heart: regular rate and rhythm, S1, S2 normal, no murmur, click, rub or gallop Abdomen: soft, non-tender; bowel sounds normal; no masses,  no organomegaly Extremities: extremities normal, atraumatic, no cyanosis or edema Pulses: 2+ and symmetric Skin: Skin color, texture, turgor normal. No rashes or lesions Neurologic: Grossly normal Psych: Pleasant  EKG: Sinus bradycardia first-degree AV block at 51-personally reviewed  ASSESSMENT: 1. CAD status post DES to the proximal LAD for non-STEMI (08/2015) 2. Dyslipidemia 3. Anxiety 4. H/o chronotropic incompetence- off beta blocker d/t this  PLAN: 1.   Billy [name] is asymptomatic and continues to be active.  He denies any chest pain or shortness of breath.  His cholesterol is near goal.  I encouraged him to continue to watch his diet as it is been a slow rise in LDL cholesterol over the past couple years.  His  goal is LDL less than 70.  He reports normal increase in heart rate up to about 112 with exertion.  No changes to his medicines today.  Follow-up with me annually or sooner as necessary.  Pixie Casino, MD, [facility], [place] Director of the Advanced Lipid Disorders &  Cardiovascular Risk Reduction Clinic Diplomate of the American Board of Clinical Lipidology Attending Cardiologist  Direct Dial: [phone]  Fax: [phone]  Website:  [name] 08/19/2018, 8:32 AM

## 2018-10-30 ENCOUNTER — Other Ambulatory Visit: Payer: Self-pay | Admitting: *Deleted

## 2018-10-30 MED ORDER — LISINOPRIL 2.5 MG PO TABS: 2.5000 mg | ORAL_TABLET | Freq: Every day | ORAL | 3 refills | Status: DC

## 2018-10-30 MED ORDER — ATORVASTATIN CALCIUM 40 MG PO TABS: 40.0000 mg | ORAL_TABLET | Freq: Every day | ORAL | 3 refills | Status: DC

## 2018-11-13 NOTE — Telephone Encounter (Signed)
error 

## 2019-01-29 ENCOUNTER — Other Ambulatory Visit: Payer: Self-pay | Admitting: Physician Assistant

## 2019-01-29 DIAGNOSIS — C44311 Basal cell carcinoma of skin of nose: Secondary | ICD-10-CM | POA: Diagnosis not present

## 2019-01-29 DIAGNOSIS — L570 Actinic keratosis: Secondary | ICD-10-CM | POA: Diagnosis not present

## 2019-05-14 DIAGNOSIS — C44311 Basal cell carcinoma of skin of nose: Secondary | ICD-10-CM | POA: Diagnosis not present

## 2019-06-30 ENCOUNTER — Other Ambulatory Visit: Payer: Self-pay

## 2019-07-25 ENCOUNTER — Ambulatory Visit (INDEPENDENT_AMBULATORY_CARE_PROVIDER_SITE_OTHER): Payer: PPO | Admitting: Internal Medicine

## 2019-07-25 ENCOUNTER — Other Ambulatory Visit (INDEPENDENT_AMBULATORY_CARE_PROVIDER_SITE_OTHER): Payer: PPO

## 2019-07-25 ENCOUNTER — Encounter: Payer: Self-pay | Admitting: Internal Medicine

## 2019-07-25 ENCOUNTER — Other Ambulatory Visit: Payer: Self-pay

## 2019-07-25 ENCOUNTER — Other Ambulatory Visit: Payer: Self-pay | Admitting: Internal Medicine

## 2019-07-25 VITALS — BP 132/82 | HR 55 | Temp 97.8°F | Ht 72.0 in | Wt 203.0 lb

## 2019-07-25 DIAGNOSIS — R739 Hyperglycemia, unspecified: Secondary | ICD-10-CM

## 2019-07-25 DIAGNOSIS — E611 Iron deficiency: Secondary | ICD-10-CM

## 2019-07-25 DIAGNOSIS — E538 Deficiency of other specified B group vitamins: Secondary | ICD-10-CM

## 2019-07-25 DIAGNOSIS — E559 Vitamin D deficiency, unspecified: Secondary | ICD-10-CM

## 2019-07-25 DIAGNOSIS — Z0000 Encounter for general adult medical examination without abnormal findings: Secondary | ICD-10-CM

## 2019-07-25 LAB — URINALYSIS, ROUTINE W REFLEX MICROSCOPIC
Bilirubin Urine: NEGATIVE
Hgb urine dipstick: NEGATIVE
Ketones, ur: NEGATIVE
Leukocytes,Ua: NEGATIVE
Nitrite: NEGATIVE
RBC / HPF: NONE SEEN (ref 0–?)
Specific Gravity, Urine: 1.02 (ref 1.000–1.030)
Total Protein, Urine: NEGATIVE
Urine Glucose: NEGATIVE
Urobilinogen, UA: 0.2 (ref 0.0–1.0)
pH: 5.5 (ref 5.0–8.0)

## 2019-07-25 LAB — BASIC METABOLIC PANEL
BUN: 19 mg/dL (ref 6–23)
CO2: 27 mEq/L (ref 19–32)
Calcium: 8.8 mg/dL (ref 8.4–10.5)
Chloride: 105 mEq/L (ref 96–112)
Creatinine, Ser: 0.89 mg/dL (ref 0.40–1.50)
GFR: 82.6 mL/min (ref >60.00)
Glucose, Bld: 101 mg/dL — ABNORMAL HIGH (ref 70–99)
Potassium: 4.5 mEq/L (ref 3.5–5.1)
Sodium: 138 mEq/L (ref 135–145)

## 2019-07-25 LAB — CBC WITH DIFFERENTIAL/PLATELET
Basophils Absolute: 0.1 10*3/uL (ref 0.0–0.1)
Basophils Relative: 0.8 % (ref 0.0–3.0)
Eosinophils Absolute: 0.2 10*3/uL (ref 0.0–0.7)
Eosinophils Relative: 2.7 % (ref 0.0–5.0)
HCT: 42.3 % (ref 39.0–52.0)
Hemoglobin: 14.2 g/dL (ref 13.0–17.0)
Lymphocytes Relative: 30.9 % (ref 12.0–46.0)
Lymphs Abs: 2.1 10*3/uL (ref 0.7–4.0)
MCHC: 33.6 g/dL (ref 30.0–36.0)
MCV: 93.9 fl (ref 78.0–100.0)
Monocytes Absolute: 0.8 10*3/uL (ref 0.1–1.0)
Monocytes Relative: 12 % (ref 3.0–12.0)
Neutro Abs: 3.6 10*3/uL (ref 1.4–7.7)
Neutrophils Relative %: 53.6 % (ref 43.0–77.0)
Platelets: 253 10*3/uL (ref 150.0–400.0)
RBC: 4.5 Mil/uL (ref 4.22–5.81)
RDW: 13.3 % (ref 11.5–15.5)
WBC: 6.8 10*3/uL (ref 4.0–10.5)

## 2019-07-25 LAB — LIPID PANEL
Cholesterol: 152 mg/dL (ref 0–200)
HDL: 47.4 mg/dL (ref >39.00)
LDL Cholesterol: 86 mg/dL (ref 0–99)
NonHDL: 104.35
Total CHOL/HDL Ratio: 3
Triglycerides: 92 mg/dL (ref 0.0–149.0)
VLDL: 18.4 mg/dL (ref 0.0–40.0)

## 2019-07-25 LAB — IBC PANEL
Iron: 95 ug/dL (ref 42–165)
Saturation Ratios: 29.9 % (ref 20.0–50.0)
Transferrin: 227 mg/dL (ref 212.0–360.0)

## 2019-07-25 LAB — HEPATIC FUNCTION PANEL
ALT: 19 U/L (ref 0–53)
AST: 17 U/L (ref 0–37)
Albumin: 4.1 g/dL (ref 3.5–5.2)
Alkaline Phosphatase: 53 U/L (ref 39–117)
Bilirubin, Direct: 0.2 mg/dL (ref 0.0–0.3)
Total Bilirubin: 1 mg/dL (ref 0.2–1.2)
Total Protein: 6.5 g/dL (ref 6.0–8.3)

## 2019-07-25 LAB — PSA: PSA: 3.72 ng/mL (ref 0.10–4.00)

## 2019-07-25 LAB — VITAMIN B12: Vitamin B-12: 340 pg/mL (ref 211–911)

## 2019-07-25 LAB — HEMOGLOBIN A1C: Hgb A1c MFr Bld: 5.6 % (ref 4.6–6.5)

## 2019-07-25 LAB — TSH: TSH: 2.01 u[IU]/mL (ref 0.35–4.50)

## 2019-07-25 LAB — VITAMIN D 25 HYDROXY (VIT D DEFICIENCY, FRACTURES): VITD: 26.48 ng/mL — ABNORMAL LOW (ref 30.00–100.00)

## 2019-07-25 MED ORDER — LISINOPRIL 2.5 MG PO TABS: 2.5000 mg | ORAL_TABLET | Freq: Every day | ORAL | 3 refills | Status: DC

## 2019-07-25 MED ORDER — ATORVASTATIN CALCIUM 40 MG PO TABS: 40.0000 mg | ORAL_TABLET | Freq: Every day | ORAL | 3 refills | Status: DC

## 2019-07-25 MED ORDER — VITAMIN D (ERGOCALCIFEROL) 1.25 MG (50000 UNIT) PO CAPS: 50000.0000 [IU] | ORAL_CAPSULE | ORAL | 0 refills | Status: DC

## 2019-07-25 NOTE — Patient Instructions (Signed)

## 2019-07-25 NOTE — Progress Notes (Signed)
Subjective:    Patient ID: Billy [name], male    DOB: 1941-06-03, 78 y.o.   MRN: [id number]  HPI    Here for wellness and f/u;  Overall doing ok;  Pt denies Chest pain, worsening SOB, DOE, wheezing, orthopnea, PND, worsening LE edema, palpitations, dizziness or syncope.  Pt denies neurological change such as new headache, facial or extremity weakness.  Pt denies polydipsia, polyuria, or low sugar symptoms. Pt states overall good compliance with treatment and medications, good tolerability, and has been trying to follow appropriate diet.  Pt denies worsening depressive symptoms, suicidal ideation or panic. No fever, night sweats, wt loss, loss of appetite, or other constitutional symptoms.  Pt states good ability with ADL's, has low fall risk, home safety reviewed and adequate, no other significant changes in hearing or vision, and only occasionally active with exercise.  No new complaints Past Medical History:  Diagnosis Date  . Benign neoplasm of colon   . Finger fracture   . Hx of colonoscopy   . Internal hemorrhoids without mention of complication   . Myocardial infarction ([place])   . Plantar fascial fibromatosis   . Prostatitis   . Rib fracture    Past Surgical History:  Procedure Laterality Date  . COLONOSCOPY    . POLYPECTOMY    . TONSILLECTOMY    . VARICOCELECTOMY     Left    reports that he quit smoking about 40 years ago. His smoking use included cigarettes. He has a 20.00 pack-year smoking history. He has never used smokeless tobacco. He reports current alcohol use of about 25.0 standard drinks of alcohol per week. He reports that he does not use drugs. family history includes Heart disease in his mother; Stroke in his father. Allergies  Allergen Reactions  . Ofloxacin     REACTION: rash   Current Outpatient Medications on File Prior to Visit  Medication Sig Dispense Refill  . aspirin EC 81 MG tablet Take 81 mg by mouth daily.    . nitroGLYCERIN (NITROSTAT) 0.4 MG SL  tablet Place 1 tablet (0.4 mg total) under the tongue every 5 (five) minutes as needed for chest pain. Max 3 doses. 25 tablet 3   No current facility-administered medications on file prior to visit.   Review of Systems Constitutional: Negative for other unusual diaphoresis, sweats, appetite or weight changes HENT: Negative for other worsening hearing loss, ear pain, facial swelling, mouth sores or neck stiffness.   Eyes: Negative for other worsening pain, redness or other visual disturbance.  Respiratory: Negative for other stridor or swelling Cardiovascular: Negative for other palpitations or other chest pain  Gastrointestinal: Negative for worsening diarrhea or loose stools, blood in stool, distention or other pain Genitourinary: Negative for hematuria, flank pain or other change in urine volume.  Musculoskeletal: Negative for myalgias or other joint swelling.  Skin: Negative for other color change, or other wound or worsening drainage.  Neurological: Negative for other syncope or numbness. Hematological: Negative for other adenopathy or swelling Psychiatric/Behavioral: Negative for hallucinations, other worsening agitation, SI, self-injury, or new decreased concentration All otherwise neg per pt     Objective:   Physical Exam BP 132/82   Pulse (!) 55   Temp 97.8 F (36.6 C) (Oral)   Ht 6' (1.829 m)   Wt 203 lb (92.1 kg)   SpO2 97%   BMI 27.53 kg/m  VS noted,  Constitutional: Pt is oriented to person, place, and time. Appears well-developed and well-nourished, in no significant  distress and comfortable Head: Normocephalic and atraumatic  Eyes: Conjunctivae and EOM are normal. Pupils are equal, round, and reactive to light Right Ear: External ear normal without discharge Left Ear: External ear normal without discharge Nose: Nose without discharge or deformity Mouth/Throat: Oropharynx is without other ulcerations and moist  Neck: Normal range of motion. Neck supple. No JVD  present. No tracheal deviation present or significant neck LA or mass Cardiovascular: Normal rate, regular rhythm, normal heart sounds and intact distal pulses.   Pulmonary/Chest: WOB normal and breath sounds without rales or wheezing  Abdominal: Soft. Bowel sounds are normal. NT. No HSM  Musculoskeletal: Normal range of motion. Exhibits no edema Lymphadenopathy: Has no other cervical adenopathy.  Neurological: Pt is alert and oriented to person, place, and time. Pt has normal reflexes. No cranial nerve deficit. Motor grossly intact, Gait intact Skin: Skin is warm and dry. No rash noted or new ulcerations Psychiatric:  Has normal mood and affect. Behavior is normal without agitation All otherwise neg per pt Lab Results  Component Value Date   WBC 6.8 07/25/2019   HGB 14.2 07/25/2019   HCT 42.3 07/25/2019   PLT 253.0 07/25/2019   GLUCOSE 101 (H) 07/25/2019   CHOL 152 07/25/2019   TRIG 92.0 07/25/2019   HDL 47.40 07/25/2019   LDLDIRECT 144.0 11/21/2010   LDLCALC 86 07/25/2019   ALT 19 07/25/2019   AST 17 07/25/2019   NA 138 07/25/2019   K 4.5 07/25/2019   CL 105 07/25/2019   CREATININE 0.89 07/25/2019   BUN 19 07/25/2019   CO2 27 07/25/2019   TSH 2.01 07/25/2019   PSA 3.72 07/25/2019   HGBA1C 5.6 07/25/2019         Assessment & Plan:

## 2019-07-26 ENCOUNTER — Encounter: Payer: Self-pay | Admitting: Internal Medicine

## 2019-07-26 NOTE — Assessment & Plan Note (Signed)

## 2019-07-26 NOTE — Assessment & Plan Note (Signed)
stable overall by history and exam, recent data reviewed with pt, and pt to continue medical treatment as before,  to f/u any worsening symptoms or concerns  

## 2019-07-28 ENCOUNTER — Other Ambulatory Visit: Payer: Self-pay | Admitting: *Deleted

## 2019-07-28 MED ORDER — VITAMIN D (ERGOCALCIFEROL) 1.25 MG (50000 UNIT) PO CAPS: 50000.0000 [IU] | ORAL_CAPSULE | ORAL | 0 refills | Status: DC

## 2019-08-20 ENCOUNTER — Ambulatory Visit: Payer: PPO | Admitting: Internal Medicine

## 2019-08-20 ENCOUNTER — Other Ambulatory Visit: Payer: Self-pay

## 2019-08-20 ENCOUNTER — Encounter: Payer: Self-pay | Admitting: Internal Medicine

## 2019-08-20 VITALS — BP 151/87 | HR 56 | Ht 72.0 in | Wt 205.2 lb

## 2019-08-20 DIAGNOSIS — I2510 Atherosclerotic heart disease of native coronary artery without angina pectoris: Secondary | ICD-10-CM | POA: Diagnosis not present

## 2019-08-20 DIAGNOSIS — E782 Mixed hyperlipidemia: Secondary | ICD-10-CM | POA: Diagnosis not present

## 2019-08-20 MED ORDER — ATORVASTATIN CALCIUM 80 MG PO TABS: 80.0000 mg | ORAL_TABLET | Freq: Every day | ORAL | 3 refills | Status: DC

## 2019-08-20 NOTE — Patient Instructions (Signed)
Medication Instructions:  INCREASE LIPITOR(ATORVASTATIN) TO 80MG  DAILY= 1 TABLET DAILY *If you need a refill on your cardiac medications before your next appointment, please call your pharmacy*  Lab Work: FASTING LIPID PANEL IN 3 MONTHS  If you have labs (blood work) drawn today and your tests are completely normal, you will receive your results only by: Marland Kitchen MyChart Message (if you have MyChart) OR . A paper copy in the mail If you have any lab test that is abnormal or we need to change your treatment, we will call you to review the results.    Follow-Up: At [facility], you and your health needs are our priority.  As part of our continuing mission to provide you with exceptional heart care, we have created designated Provider Care Teams.  These Care Teams include your primary Cardiologist (physician) and Advanced Practice Providers (APPs -  Physician Assistants and Nurse Practitioners) who all work together to provide you with the care you need, when you need it.  Your next appointment:   12 month(s)  The format for your next appointment:   Either In Person or Virtual  Provider:   Raliegh Ip Mali Hilty, MD

## 2019-08-20 NOTE — Progress Notes (Signed)
OFFICE NOTE  Chief Complaint:  Annual follow-up  Primary Care Physician: Biagio Borg, MD  HPI:  Billy [name] is a pleasant 79 year old male who unfortunately had a recent anterior MI. He was doing some skiing up in the [facility] and had some chest discomfort. This worsened over the period of 1-2 days and then he presented to the emergency department at [facility]. Subsequently he was found to have a small elevation in troponin consistent with non-ST elevation MI. He was started on heparin aspirin and Lipitor and cardiology was consult it. He underwent cardiac catheterization by Dr. Beryle Flock, which demonstrated a 90% ruptured plaque in the LAD that was visualized by either [place]. He underwent placement of a 3.0 mm x 22 mm integrity resolution drug-eluting stent. This reduced the 90% lesion to 0 and TIMI-3 flow was noted. He was placed on aspirin and Brilinta and felt much better after discharge. He was also placed on metoprolol 25 mg twice a day. Today he returns in follow-up. He has a myriad of questions for me. Most have to do with follow-up, medications, and limitations. He denies any chest pain or worsening shortness of breath since his discharge. Blood pressure stable today 126/82 however heart rate is low at 45.  Billy [name] returns today for follow-up. He started cardiac rehabilitation but is noticed that he gets short of breath particularly with exercise. I received telemetry strips from cardiac rehabilitation which showed significant bradycardia with heart rates in the 40s which was a sinus rhythm. He is noted that when he exercises heart rate rarely gets much above the 80s. This indicates a flat heart rate response to exercise and could suggest a degree of chronotropic incompetence. Billy [name] noted that he had a low heart rate most of his life and was placed on low-dose beta blocker after his MI. Initially was 25 mg twice a day which I reduced to 12-1/2 mg twice a  day. Heart rate at rest today is 50 which is slightly higher than it had been previously. Blood pressure accordingly is gone up some to 132/85. In addition one is concerned about possible shortness of breath related to Brilinta. He was advised to try some caffeine after taking the medicine but noted no difference in his shortness of breath. He denies any angina.  12/10/2015  Billy [name] was seen back in the office today for follow-up. At his last office visit I stopped his Brilinta switched him over to Effient. We also discontinued his beta blocker. He notes a marked improvement of the symptoms in exercise tolerance. Heart rate has correspondingly increased better with exercise. His shortness of breath is totally resolved. Generally feels excellent. Blood pressures been very well controlled even though he is on no blood pressure medications between 829 and 562 systolic. There is not necessarily been enough room to add low-dose ACE inhibitor.  08/11/2016  Billy [name] returns today for follow-up. He says he is feeling well. He denies any chest pain or worsening shortness of breath. He's interested in going skiing. Is a little hesitant because his last MI was associated with skiing about a year ago. Actually his MI was on 09/01/2015, and I advised him that he can discontinue his Effient on that date. He will need to remain on aspirin and atorvastatin. Recent lipid testing showed excellent control with LDL of 52. Particle number less than 1000. He is due for repeat lipid profile. We appreciate discussed a low-dose ACE inhibitor or ARB because  of his history of coronary disease. Beta blocker is not likely to be tolerated due to low heart rate at rest.  08/13/2017  Billy [name] returns today for annual follow-up.  Over the past year he is done very well.  He denies any chest pain or worsening shortness of breath.  He is on minimal medications at this time.  His primary care provider recently repeated his labs 3 weeks  ago showed total cholesterol 135, triglycerides 58, HDL 55 and LDL 69.  His only other complaint today is some irregularity in bowel movements.  He is taking a probiotic.  We discussed some suggestions including adding a bulk fiber agent such as Metamucil to his diet and increasing his water intake.  08/19/2018  Billy [name] is seen today in annual follow-up. He is very active, climbing ladders, doing housework and gets usually 10,000 steps/day. He denies chest pain, dyspnea or associated symptoms.  He recently saw his PCP about a few weeks ago.  Lab work indicated total cholesterol 140, triglycerides 68, HDL 54 and LDL 73.  Overall he is well without complaints.  08/2019  Billy [name] is seen today for an annual follow-up.  He continues to do well and denies chest pain or worsening shortness of breath.  He focuses on try to get more than 10,000 steps per day as previously noted.  I have seen a trend in increasing cholesterol, specifically his LDL is gone from [phone] and now 86.  He remains on atorvastatin 40 mg daily.  Blood pressure was elevated today however he says at home is generally between 315 and 945 systolic.  PMHx:  Past Medical History:  Diagnosis Date  . Benign neoplasm of colon   . Finger fracture   . Hx of colonoscopy   . Internal hemorrhoids without mention of complication   . Myocardial infarction ([place])   . Plantar fascial fibromatosis   . Prostatitis   . Rib fracture     Past Surgical History:  Procedure Laterality Date  . COLONOSCOPY    . POLYPECTOMY    . TONSILLECTOMY    . VARICOCELECTOMY     Left    FAMHx:  Family History  Problem Relation Age of Onset  . Heart disease Mother        ? CAD. died 59  . Stroke Father        at age 75, smoked cigar  . Diabetes Neg Hx   . Prostate cancer Neg Hx   . Colon cancer Neg Hx   . Stomach cancer Neg Hx   . Rectal cancer Neg Hx     SOCHx:   reports that he quit smoking about 41 years ago. His smoking use included  cigarettes. He has a 20.00 pack-year smoking history. He has never used smokeless tobacco. He reports current alcohol use of about 25.0 standard drinks of alcohol per week. He reports that he does not use drugs.  ALLERGIES:  Allergies  Allergen Reactions  . Ofloxacin     REACTION: rash    ROS: Pertinent items noted in HPI and remainder of comprehensive ROS otherwise negative.  HOME MEDS: Current Outpatient Medications  Medication Sig Dispense Refill  . aspirin EC 81 MG tablet Take 81 mg by mouth daily.    Marland Kitchen atorvastatin (LIPITOR) 40 MG tablet Take 1 tablet (40 mg total) by mouth daily at 6 PM. 90 tablet 3  . lisinopril (ZESTRIL) 2.5 MG tablet Take 1 tablet (2.5 mg total) by mouth daily. 90 tablet  3  . nitroGLYCERIN (NITROSTAT) 0.4 MG SL tablet Place 1 tablet (0.4 mg total) under the tongue every 5 (five) minutes as needed for chest pain. Max 3 doses. 25 tablet 3  . Vitamin D, Ergocalciferol, (DRISDOL) 1.25 MG (50000 UT) CAPS capsule Take 1 capsule (50,000 Units total) by mouth every 7 (seven) days. 12 capsule 0   No current facility-administered medications for this visit.    LABS/IMAGING: No results found for this or any previous visit (from the past 48 hour(s)). No results found.  WEIGHTS: Wt Readings from Last 3 Encounters:  08/20/19 205 lb 3.2 oz (93.1 kg)  07/25/19 203 lb (92.1 kg)  08/19/18 197 lb 12.8 oz (89.7 kg)    VITALS: BP (!) 151/87   Pulse (!) 56   Ht 6' (1.829 m)   Wt 205 lb 3.2 oz (93.1 kg)   SpO2 99%   BMI 27.83 kg/m   EXAM: General appearance: alert and no distress Neck: no carotid bruit, no JVD and thyroid not enlarged, symmetric, no tenderness/mass/nodules Lungs: clear to auscultation bilaterally Heart: regular rate and rhythm, S1, S2 normal, no murmur, click, rub or gallop Abdomen: soft, non-tender; bowel sounds normal; no masses,  no organomegaly Extremities: extremities normal, atraumatic, no cyanosis or edema Pulses: 2+ and symmetric Skin:  Skin color, texture, turgor normal. No rashes or lesions Neurologic: Grossly normal Psych: Pleasant  EKG: Sinus bradycardia with PACs at 56 -personally reviewed  ASSESSMENT: 1. CAD status post DES to the proximal LAD for non-STEMI (08/2015) 2. Dyslipidemia 3. Anxiety 4. H/o chronotropic incompetence- off beta blocker d/t this  PLAN: 1.   Billy [name] has had a rising LDL cholesterol with no significant dietary changes.  He remains physically active.  Weight is up a few pounds.  I advised him to continue to work on diet and some weight loss as well as more physical activity.  We will plan to increase his atorvastatin from 40 to 80 mg daily to try to target LDL less than 70.  He is able to get his heart rate up with physical activity and therefore no changes were made today and medicines.  Follow-up with me annually or sooner as necessary.  Pixie Casino, MD, [facility], [place] Director of the Advanced Lipid Disorders &  Cardiovascular Risk Reduction Clinic Diplomate of the American Board of Clinical Lipidology Attending Cardiologist  Direct Dial: [phone]  Fax: [phone]  Website:  [email or web address] 08/20/2019, 9:41 AM

## 2019-09-06 ENCOUNTER — Ambulatory Visit: Payer: PPO

## 2019-10-17 DIAGNOSIS — E782 Mixed hyperlipidemia: Secondary | ICD-10-CM | POA: Diagnosis not present

## 2019-10-17 LAB — LIPID PANEL
Chol/HDL Ratio: 2.3 ratio (ref 0.0–5.0)
Cholesterol, Total: 125 mg/dL (ref 100–199)
HDL: 55 mg/dL (ref >39)
LDL Chol Calc (NIH): 56 mg/dL (ref 0–99)
Triglycerides: 67 mg/dL (ref 0–149)
VLDL Cholesterol Cal: 14 mg/dL (ref 5–40)

## 2019-10-20 ENCOUNTER — Encounter: Payer: Self-pay | Admitting: *Deleted

## 2019-11-15 DIAGNOSIS — S4991XA Unspecified injury of right shoulder and upper arm, initial encounter: Secondary | ICD-10-CM | POA: Diagnosis not present

## 2019-11-15 DIAGNOSIS — S51811A Laceration without foreign body of right forearm, initial encounter: Secondary | ICD-10-CM | POA: Diagnosis not present

## 2019-11-15 DIAGNOSIS — S40021A Contusion of right upper arm, initial encounter: Secondary | ICD-10-CM | POA: Diagnosis not present

## 2019-11-15 DIAGNOSIS — T798XXA Other early complications of trauma, initial encounter: Secondary | ICD-10-CM | POA: Diagnosis not present

## 2019-12-03 ENCOUNTER — Encounter: Payer: Self-pay | Admitting: Gastroenterology

## 2020-01-16 ENCOUNTER — Other Ambulatory Visit: Payer: Self-pay

## 2020-01-16 ENCOUNTER — Ambulatory Visit (AMBULATORY_SURGERY_CENTER): Payer: Self-pay

## 2020-01-16 VITALS — Wt 201.6 lb

## 2020-01-16 DIAGNOSIS — Z86010 Personal history of colonic polyps: Secondary | ICD-10-CM

## 2020-01-16 MED ORDER — SUTAB 1479-225-188 MG PO TABS: 12.0000 | ORAL_TABLET | ORAL | 0 refills | Status: DC

## 2020-01-16 NOTE — Progress Notes (Signed)
No allergies to soy or egg Pt is not on blood thinners or diet pills Denies issues with sedation/intubation Denies atrial flutter/fib Denies constipation    Pt is aware of Covid safety and care partner requirements.   Pt denies implant post MI.

## 2020-01-22 ENCOUNTER — Encounter: Payer: Self-pay | Admitting: Gastroenterology

## 2020-01-26 ENCOUNTER — Encounter: Payer: Self-pay | Admitting: *Deleted

## 2020-01-29 ENCOUNTER — Ambulatory Visit: Payer: PPO | Admitting: Physician Assistant

## 2020-01-30 ENCOUNTER — Encounter: Payer: Self-pay | Admitting: Gastroenterology

## 2020-01-30 ENCOUNTER — Other Ambulatory Visit: Payer: Self-pay

## 2020-01-30 ENCOUNTER — Ambulatory Visit (AMBULATORY_SURGERY_CENTER): Payer: PPO | Admitting: Gastroenterology

## 2020-01-30 VITALS — BP 148/79 | HR 51 | Temp 96.9°F | Resp 15 | Ht 72.0 in | Wt 201.6 lb

## 2020-01-30 DIAGNOSIS — Z86010 Personal history of colonic polyps: Secondary | ICD-10-CM | POA: Diagnosis not present

## 2020-01-30 DIAGNOSIS — D123 Benign neoplasm of transverse colon: Secondary | ICD-10-CM

## 2020-01-30 DIAGNOSIS — Z1211 Encounter for screening for malignant neoplasm of colon: Secondary | ICD-10-CM | POA: Diagnosis not present

## 2020-01-30 MED ORDER — SODIUM CHLORIDE 0.9 % IV SOLN: 500.0000 mL | Freq: Once | INTRAVENOUS | Status: DC

## 2020-01-30 NOTE — Progress Notes (Signed)
Called to room to assist during endoscopic procedure.  Patient ID and intended procedure confirmed with present staff. Received instructions for my participation in the procedure from the performing physician.  

## 2020-01-30 NOTE — Op Note (Signed)
[place] Patient Name: Billy [name] Procedure Date: 01/30/2020 9:27 AM MRN: [id number] Endoscopist: Ladene Artist , MD Age: 79 Referring MD:  Date of Birth: [birth year] Gender: Male Account #: [id number] Procedure:                Colonoscopy Indications:              Surveillance: Personal history of adenomatous                            polyps and sessile serrated polyps on last                            colonoscopy 5 years ago Medicines:                Monitored Anesthesia Care Procedure:                Pre-Anesthesia Assessment:                           - Prior to the procedure, a History and Physical                            was performed, and patient medications and                            allergies were reviewed. The patient's tolerance of                            previous anesthesia was also reviewed. The risks                            and benefits of the procedure and the sedation                            options and risks were discussed with the patient.                            All questions were answered, and informed consent                            was obtained. Prior Anticoagulants: The patient has                            taken no previous anticoagulant or antiplatelet                            agents. ASA Grade Assessment: III - A patient with                            severe systemic disease. After reviewing the risks                            and benefits, the patient was deemed in  satisfactory condition to undergo the procedure.                           After obtaining informed consent, the colonoscope                            was passed under direct vision. Throughout the                            procedure, the patient's blood pressure, pulse, and                            oxygen saturations were monitored continuously. The                            Colonoscope was introduced through the anus  and                            advanced to the the cecum, identified by                            appendiceal orifice and ileocecal valve. The                            ileocecal valve, appendiceal orifice, and rectum                            were photographed. The quality of the bowel                            preparation was good. The colonoscopy was performed                            without difficulty. The patient tolerated the                            procedure well. Scope In: 9:32:16 AM Scope Out: 9:48:08 AM Scope Withdrawal Time: 0 hours 12 minutes 49 seconds  Total Procedure Duration: 0 hours 15 minutes 52 seconds  Findings:                 The perianal and digital rectal examinations were                            normal.                           Two sessile polyps were found in the transverse                            colon. The polyps were 7 to 8 mm in size. These                            polyps were removed with a cold snare. Resection  and retrieval were complete.                           Internal hemorrhoids were found during                            retroflexion. The hemorrhoids were small and Grade                            I (internal hemorrhoids that do not prolapse).                           The exam was otherwise without abnormality on                            direct and retroflexion views. Complications:            No immediate complications. Estimated blood loss:                            None. Estimated Blood Loss:     Estimated blood loss: none. Impression:               - Two 7 to 8 mm polyp in the transverse colon,                            removed with a cold snare. Resected and retrieved.                           - Internal hemorrhoids.                           - The examination was otherwise normal on direct                            and retroflexion views. Recommendation:           - Patient has a contact  number available for                            emergencies. The signs and symptoms of potential                            delayed complications were discussed with the                            patient. Return to normal activities tomorrow.                            Written discharge instructions were provided to the                            patient.                           - Resume previous diet.                           -  Continue present medications.                           - Await pathology results.                           - No repeat colonoscopy due to age. Ladene Artist, MD 01/30/2020 9:52:26 AM This report has been signed electronically.

## 2020-01-30 NOTE — Progress Notes (Signed)
To PACU, VSS. Report to [email or web address] 

## 2020-01-30 NOTE — Patient Instructions (Signed)
Handouts given for polyps and hemorrhoids.  No repeat colonoscopy due to age!  YOU HAD AN ENDOSCOPIC PROCEDURE TODAY AT [facility]:   Refer to the procedure report that was given to you for any specific questions about what was found during the examination.  If the procedure report does not answer your questions, please call your gastroenterologist to clarify.  If you requested that your care partner not be given the details of your procedure findings, then the procedure report has been included in a sealed envelope for you to review at your convenience later.  YOU SHOULD EXPECT: Some feelings of bloating in the abdomen. Passage of more gas than usual.  Walking can help get rid of the air that was put into your GI tract during the procedure and reduce the bloating. If you had a lower endoscopy (such as a colonoscopy or flexible sigmoidoscopy) you may notice spotting of blood in your stool or on the toilet paper. If you underwent a bowel prep for your procedure, you may not have a normal bowel movement for a few days.  Please Note:  You might notice some irritation and congestion in your nose or some drainage.  This is from the oxygen used during your procedure.  There is no need for concern and it should clear up in a day or so.  SYMPTOMS TO REPORT IMMEDIATELY:   Following lower endoscopy (colonoscopy or flexible sigmoidoscopy):  Excessive amounts of blood in the stool  Significant tenderness or worsening of abdominal pains  Swelling of the abdomen that is new, acute  Fever of 100F or higher  For urgent or emergent issues, a gastroenterologist can be reached at any hour by calling [phone]. Do not use MyChart messaging for urgent concerns.    DIET:  We do recommend a small meal at first, but then you may proceed to your regular diet.  Drink plenty of fluids but you should avoid alcoholic beverages for 24 hours.  ACTIVITY:  You should plan to take it easy for the rest  of today and you should NOT DRIVE or use heavy machinery until tomorrow (because of the sedation medicines used during the test).    FOLLOW UP: Our staff will call the number listed on your records 48-72 hours following your procedure to check on you and address any questions or concerns that you may have regarding the information given to you following your procedure. If we do not reach you, we will leave a message.  We will attempt to reach you two times.  During this call, we will ask if you have developed any symptoms of COVID 19. If you develop any symptoms (ie: fever, flu-like symptoms, shortness of breath, cough etc.) before then, please call [phone].  If you test positive for Covid 19 in the 2 weeks post procedure, please call and report this information to [place].    If any biopsies were taken you will be contacted by phone or by letter within the next 1-3 weeks.  Please call us at [phone] if you have not heard about the biopsies in 3 weeks.    SIGNATURES/CONFIDENTIALITY: You and/or your care partner have signed paperwork which will be entered into your electronic medical record.  These signatures attest to the fact that that the information above on your After Visit Summary has been reviewed and is understood.  Full responsibility of the confidentiality of this discharge information lies with you and/or your care-partner.

## 2020-01-30 NOTE — Progress Notes (Signed)
VS- VV  2nd dose of covid vaccine January 2021  Pt's states no medical or surgical changes since previsit or office visit.

## 2020-02-03 ENCOUNTER — Telehealth: Payer: Self-pay | Admitting: *Deleted

## 2020-02-03 NOTE — Telephone Encounter (Signed)
  Follow up Call-  Call back number 01/30/2020  Post procedure Call Back phone  # [phone]  Permission to leave phone message Yes  Some recent data might be hidden     Patient questions:  Do you have a fever, pain , or abdominal swelling? No. Pain Score  0 *  Have you tolerated food without any problems? Yes.    Have you been able to return to your normal activities? Yes.    Do you have any questions about your discharge instructions: Diet   No. Medications  No. Follow up visit  No.  Do you have questions or concerns about your Care? No.  Actions: * If pain score is 4 or above: No action needed, pain <4.  Pt. At beach,stated "everything fine.  1. Have you developed a fever since your procedure? no  2.   Have you had an respiratory symptoms (SOB or cough) since your procedure? no  3.   Have you tested positive for COVID 19 since your procedure no  4.   Have you had any family members/close contacts diagnosed with the COVID 19 since your procedure?  no   If yes to any of these questions please route to Joylene John, RN and Erenest Rasher, RN

## 2020-02-06 ENCOUNTER — Encounter: Payer: Self-pay | Admitting: Gastroenterology

## 2020-04-20 ENCOUNTER — Encounter: Payer: Self-pay | Admitting: Physician Assistant

## 2020-04-20 ENCOUNTER — Other Ambulatory Visit: Payer: Self-pay

## 2020-04-20 ENCOUNTER — Ambulatory Visit: Payer: PPO | Admitting: Physician Assistant

## 2020-04-20 DIAGNOSIS — Z85828 Personal history of other malignant neoplasm of skin: Secondary | ICD-10-CM | POA: Diagnosis not present

## 2020-04-20 DIAGNOSIS — Z1283 Encounter for screening for malignant neoplasm of skin: Secondary | ICD-10-CM

## 2020-04-20 DIAGNOSIS — C44729 Squamous cell carcinoma of skin of left lower limb, including hip: Secondary | ICD-10-CM

## 2020-04-20 NOTE — Patient Instructions (Signed)

## 2020-04-20 NOTE — Progress Notes (Addendum)
° °  Follow-Up Visit   Subjective  [place] is a 79 y.o. male who presents for the following: Annual Exam (no new concerns).   The following portions of the chart were reviewed this encounter and updated as appropriate: Tobacco   Allergies   Meds   Problems   Med Hx   Surg Hx   Fam Hx       Objective  Well appearing patient in no apparent distress; mood and affect are within normal limits.  A full examination was performed including scalp, head, eyes, ears, nose, lips, neck, chest, axillae, abdomen, back, buttocks, bilateral upper extremities, bilateral lower extremities, hands, feet, fingers, toes, fingernails, and toenails. All findings within normal limits unless otherwise noted below.  Objective  Head to Toe: No atypical nevi   Objective  nose (3): All scars clear  Objective  Head - Anterior (Face), Right Chest: Scars clear  Objective  Left Lower Leg - Posterior: Pearly papule with telangectasia.        Assessment & Plan  Screening exam for skin cancer Head to Toe  Yearly skin exams  History of basal cell carcinoma (BCC) (3) nose  observe  History of SCC (squamous cell carcinoma) of skin (2) Head - Anterior (Face); Right Chest  observe  SCC (squamous cell carcinoma), leg, left Left Lower Leg - Posterior  Skin / nail biopsy Type of biopsy: tangential   Informed consent: discussed and consent obtained   Timeout: patient name, date of birth, surgical site, and procedure verified   Anesthesia: the lesion was anesthetized in a standard fashion   Anesthetic:  1% lidocaine w/ epinephrine 1-100,000 local infiltration Instrument used: flexible razor blade   Hemostasis achieved with: aluminum chloride and electrodesiccation   Outcome: patient tolerated procedure well   Post-procedure details: wound care instructions given    Destruction of lesion Complexity: simple   Destruction method: electrodesiccation and curettage   Informed consent: discussed  and consent obtained   Timeout:  patient name, date of birth, surgical site, and procedure verified Anesthesia: the lesion was anesthetized in a standard fashion   Anesthetic:  1% lidocaine w/ epinephrine 1-100,000 local infiltration Curettage performed in three different directions: Yes   Electrodesiccation performed over the curetted area: Yes   Curettage cycles:  3 Margin per side (cm):  0.1 Final wound size (cm):  1.1 Hemostasis achieved with:  aluminum chloride Outcome: patient tolerated procedure well with no complications   Post-procedure details: wound care instructions given    Specimen 1 - Surgical pathology Differential Diagnosis: bcc vs scc Check Margins: yes   I, Romano Stigger, PA-C, have reviewed all documentation's for this visit.  The documentation on 04/28/20 for the exam, diagnosis, procedures and orders are all accurate and complete.

## 2020-04-28 ENCOUNTER — Telehealth: Payer: Self-pay | Admitting: *Deleted

## 2020-04-28 NOTE — Telephone Encounter (Signed)
Left message for patient to return our phone call.

## 2020-04-28 NOTE — Addendum Note (Signed)
Addended by: Robyne Askew R on: 04/28/2020 03:07 PM   Modules accepted: Level of Service

## 2020-04-28 NOTE — Telephone Encounter (Signed)
-----   Message from Warren Danes, [place] sent at 04/27/2020  1:28 PM EDT ----- Recheck 6 mo

## 2020-05-03 ENCOUNTER — Telehealth: Payer: Self-pay | Admitting: Physician Assistant

## 2020-05-03 NOTE — Telephone Encounter (Signed)
Possibly calling for results. Says we tried calling Friday; he says message said to call Anderson Malta but he's not sure why.

## 2020-05-03 NOTE — Telephone Encounter (Signed)
Pathology to patient, recheck in 6 months, patient will call back to make follow up appointment.  He didn't have his calendar.

## 2020-05-03 NOTE — Telephone Encounter (Signed)
Patient is returning our call.  Patient is out of town and can be reached at [phone].

## 2020-07-21 ENCOUNTER — Other Ambulatory Visit: Payer: Self-pay

## 2020-07-21 DIAGNOSIS — E782 Mixed hyperlipidemia: Secondary | ICD-10-CM

## 2020-07-21 MED ORDER — ATORVASTATIN CALCIUM 80 MG PO TABS: 80.0000 mg | ORAL_TABLET | Freq: Every day | ORAL | 3 refills | Status: DC

## 2020-07-28 ENCOUNTER — Ambulatory Visit (INDEPENDENT_AMBULATORY_CARE_PROVIDER_SITE_OTHER): Payer: PPO | Admitting: Internal Medicine

## 2020-07-28 ENCOUNTER — Encounter: Payer: Self-pay | Admitting: Internal Medicine

## 2020-07-28 ENCOUNTER — Other Ambulatory Visit: Payer: Self-pay

## 2020-07-28 VITALS — BP 140/80 | HR 51 | Temp 98.0°F | Ht 72.0 in | Wt 200.0 lb

## 2020-07-28 DIAGNOSIS — Z1159 Encounter for screening for other viral diseases: Secondary | ICD-10-CM

## 2020-07-28 DIAGNOSIS — E782 Mixed hyperlipidemia: Secondary | ICD-10-CM | POA: Diagnosis not present

## 2020-07-28 DIAGNOSIS — R739 Hyperglycemia, unspecified: Secondary | ICD-10-CM

## 2020-07-28 DIAGNOSIS — E559 Vitamin D deficiency, unspecified: Secondary | ICD-10-CM | POA: Diagnosis not present

## 2020-07-28 DIAGNOSIS — Z0000 Encounter for general adult medical examination without abnormal findings: Secondary | ICD-10-CM

## 2020-07-28 LAB — BASIC METABOLIC PANEL
BUN: 18 mg/dL (ref 6–23)
CO2: 28 mEq/L (ref 19–32)
Calcium: 9 mg/dL (ref 8.4–10.5)
Chloride: 104 mEq/L (ref 96–112)
Creatinine, Ser: 0.95 mg/dL (ref 0.40–1.50)
GFR: 76.21 mL/min (ref >60.00)
Glucose, Bld: 98 mg/dL (ref 70–99)
Potassium: 4.5 mEq/L (ref 3.5–5.1)
Sodium: 138 mEq/L (ref 135–145)

## 2020-07-28 LAB — CBC WITH DIFFERENTIAL/PLATELET
Basophils Absolute: 0.1 10*3/uL (ref 0.0–0.1)
Basophils Relative: 0.7 % (ref 0.0–3.0)
Eosinophils Absolute: 0.1 10*3/uL (ref 0.0–0.7)
Eosinophils Relative: 1.5 % (ref 0.0–5.0)
HCT: 43.5 % (ref 39.0–52.0)
Hemoglobin: 14.6 g/dL (ref 13.0–17.0)
Lymphocytes Relative: 27.5 % (ref 12.0–46.0)
Lymphs Abs: 2.1 10*3/uL (ref 0.7–4.0)
MCHC: 33.6 g/dL (ref 30.0–36.0)
MCV: 93.3 fl (ref 78.0–100.0)
Monocytes Absolute: 0.8 10*3/uL (ref 0.1–1.0)
Monocytes Relative: 10.3 % (ref 3.0–12.0)
Neutro Abs: 4.6 10*3/uL (ref 1.4–7.7)
Neutrophils Relative %: 60 % (ref 43.0–77.0)
Platelets: 265 10*3/uL (ref 150.0–400.0)
RBC: 4.66 Mil/uL (ref 4.22–5.81)
RDW: 13.3 % (ref 11.5–15.5)
WBC: 7.7 10*3/uL (ref 4.0–10.5)

## 2020-07-28 LAB — LIPID PANEL
Cholesterol: 135 mg/dL (ref 0–200)
HDL: 50.5 mg/dL (ref >39.00)
LDL Cholesterol: 67 mg/dL (ref 0–99)
NonHDL: 84.21
Total CHOL/HDL Ratio: 3
Triglycerides: 88 mg/dL (ref 0.0–149.0)
VLDL: 17.6 mg/dL (ref 0.0–40.0)

## 2020-07-28 LAB — URINALYSIS, ROUTINE W REFLEX MICROSCOPIC
Bilirubin Urine: NEGATIVE
Hgb urine dipstick: NEGATIVE
Ketones, ur: NEGATIVE
Leukocytes,Ua: NEGATIVE
Nitrite: NEGATIVE
RBC / HPF: NONE SEEN (ref 0–?)
Specific Gravity, Urine: 1.02 (ref 1.000–1.030)
Total Protein, Urine: NEGATIVE
Urine Glucose: NEGATIVE
Urobilinogen, UA: 0.2 (ref 0.0–1.0)
pH: 6 (ref 5.0–8.0)

## 2020-07-28 LAB — HEPATIC FUNCTION PANEL
ALT: 21 U/L (ref 0–53)
AST: 20 U/L (ref 0–37)
Albumin: 4.3 g/dL (ref 3.5–5.2)
Alkaline Phosphatase: 57 U/L (ref 39–117)
Bilirubin, Direct: 0.2 mg/dL (ref 0.0–0.3)
Total Bilirubin: 1.1 mg/dL (ref 0.2–1.2)
Total Protein: 6.9 g/dL (ref 6.0–8.3)

## 2020-07-28 LAB — TSH: TSH: 2.82 u[IU]/mL (ref 0.35–4.50)

## 2020-07-28 LAB — HEMOGLOBIN A1C: Hgb A1c MFr Bld: 5.7 % (ref 4.6–6.5)

## 2020-07-28 LAB — PSA: PSA: 2.75 ng/mL (ref 0.10–4.00)

## 2020-07-28 NOTE — Patient Instructions (Signed)

## 2020-07-28 NOTE — Progress Notes (Deleted)
   Subjective:    Patient ID: Billy [name], male    DOB: 10-03-1940, 79 y.o.   MRN: [id number]  HPI   Hasd nocturia x 2 most nights, takes saw palmetteo with a prostate vitamin.      Review of Systems     Objective:   Physical Exam        Assessment & Plan:

## 2020-08-03 ENCOUNTER — Encounter: Payer: Self-pay | Admitting: Internal Medicine

## 2020-08-03 DIAGNOSIS — E559 Vitamin D deficiency, unspecified: Secondary | ICD-10-CM | POA: Insufficient documentation

## 2020-08-03 NOTE — Assessment & Plan Note (Signed)
stable overall by history and exam, recent data reviewed with pt, and pt to continue medical treatment as before,  to f/u any worsening symptoms or concerns Lab Results  Component Value Date   HGBA1C 5.7 07/28/2020

## 2020-08-03 NOTE — Assessment & Plan Note (Signed)
Lab Results  Component Value Date   LDLCALC 67 07/28/2020  stable overall by history and exam, recent data reviewed with pt, and pt to continue medical treatment as before,  to f/u any worsening symptoms or concerns

## 2020-08-03 NOTE — Assessment & Plan Note (Signed)

## 2020-08-03 NOTE — Assessment & Plan Note (Signed)
  Last vitamin D Lab Results  Component Value Date   VD25OH 26.48 (L) 07/25/2019   To start vit d 2000 u qd

## 2020-08-03 NOTE — Progress Notes (Signed)
Established Patient Office Visit  Subjective:  Patient ID: Billy [name], male    DOB: 30-Mar-1941  Age: 79 y.o. MRN: [id number]       Chief Complaint: (concise statement describing the symptom, problem, condition, diagnosis, physician recommended return, or other factor as reason for encounter): wellness and low vit d, hyperglycemia       HPI:  Billy [name] is a 79 y.o. male here for wellness overall doing ok;  Pt denies Chest pain, worsening SOB, DOE, wheezing, orthopnea, PND, worsening LE edema, palpitations, dizziness or syncope.  Pt denies neurological change such as new headache, facial or extremity weakness.  Pt denies polydipsia, polyuria. Pt states overall good compliance with treatment and medications, good medication tolerability, and has been trying to follow appropriate diet.  Pt denies worsening depressive symptoms, suicidal ideation or panic. Denies fever, night sweats, wt loss, loss of appetite, or other constitutional symptoms.  Pt states good ability with ADL's, has low fall risk, home safety reviewed and adequate, no other significant changes in hearing or vision, and occasionally active with exercise.        Wt Readings from Last 3 Encounters:  07/28/20 200 lb (90.7 kg)  01/30/20 201 lb 9.6 oz (91.4 kg)  01/16/20 201 lb 9.6 oz (91.4 kg)   BP Readings from Last 3 Encounters:  07/28/20 140/80  01/30/20 (!) 148/79  08/20/19 (!) 151/87    Past Medical History:  Diagnosis Date  . Basal cell carcinoma 05/29/2012   left thigh-(EXC)  . Basal cell carcinoma 10/23/2012   right side of nose (MOHS)  . Basal cell carcinoma 12/12/2012   nod-irght cheek (MOHS)  . Basal cell carcinoma 05/26/2015   nod-left calf (CX35FU)  . Basal cell carcinoma 05/26/2015   nod-left nose (txpbx)  . Basal cell carcinoma 06/08/2016   nod-above right brow (CX35FU)  . Basal cell carcinoma 09/12/2016   left nose (MOHS)  . Basal cell carcinoma 01/29/2019   nod-right tip of nose (MOHS)  . Benign  neoplasm of colon   . Finger fracture   . Hx of colonoscopy   . Hyperlipidemia   . Hypertension   . Internal hemorrhoids without mention of complication   . Myocardial infarction (HCC)    09/01/2015  . Plantar fascial fibromatosis   . Prostatitis   . Rib fracture   . Squamous cell carcinoma of skin 07/13/2009   RIght lower outer leg  . Squamous cell carcinoma of skin 02/17/2014   in situ-right temple (txpbx)  . Squamous cell carcinoma of skin 01/24/2018   in situ-right chest (txpbx)  . Squamous cell carcinoma of skin 01/24/2018   in situ0 right forehead (txpbx)  . Squamous cell carcinoma of skin    Past Surgical History:  Procedure Laterality Date  . cardiac stint  2017  . COLONOSCOPY  2016  . POLYPECTOMY    . TONSILLECTOMY    . VARICOCELECTOMY     Left    reports that he quit smoking about 42 years ago. His smoking use included cigarettes. He has a 20.00 pack-year smoking history. He has never used smokeless tobacco. He reports current alcohol use of about 25.0 standard drinks of alcohol per week. He reports that he does not use drugs. family history includes Heart disease in his mother; Stroke in his father. Allergies  Allergen Reactions  . Ofloxacin     REACTION: rash   Current Outpatient Medications on File Prior to Visit  Medication Sig Dispense Refill  . aspirin  EC 81 MG tablet Take 81 mg by mouth daily.    Marland Kitchen atorvastatin (LIPITOR) 80 MG tablet Take 1 tablet (80 mg total) by mouth daily at 6 PM. 90 tablet 3  . FLUZONE HIGH-DOSE QUADRIVALENT 0.7 ML SUSY     . lisinopril (ZESTRIL) 2.5 MG tablet Take 1 tablet (2.5 mg total) by mouth daily. 90 tablet 3  . nitroGLYCERIN (NITROSTAT) 0.4 MG SL tablet Place 1 tablet (0.4 mg total) under the tongue every 5 (five) minutes as needed for chest pain. Max 3 doses. 25 tablet 3  . Omega-3 Fatty Acids (FISH OIL) 500 MG CAPS 1 capsule     No current facility-administered medications on file prior to visit.        ROS:  All others  reviewed and negative.  Objective        PE:  BP 140/80 (BP Location: Left Arm, Patient Position: Sitting, Cuff Size: Large)   Pulse (!) 51   Temp 98 F (36.7 C) (Oral)   Ht 6' (1.829 m)   Wt 200 lb (90.7 kg)   SpO2 97%   BMI 27.12 kg/m                 Constitutional: Pt appears in NAD               HENT: Head: NCAT.                Right Ear: External ear normal.                 Left Ear: External ear normal.                Eyes: . Pupils are equal, round, and reactive to light. Conjunctivae and EOM are normal               Nose: without d/c or deformity               Neck: Neck supple. Gross normal ROM               Cardiovascular: Normal rate and regular rhythm.                 Pulmonary/Chest: Effort normal and breath sounds without rales or wheezing.                Abd:  Soft, NT, ND, + BS, no organomegaly               Neurological: Pt is alert. At baseline orientation, motor grossly intact               Skin: Skin is warm. No rashes, no other new lesions, LE edema none               Psychiatric: Pt behavior is normal without agitation   Assessment/Plan:  Billy [name] is a 79 y.o. White or Caucasian [1] male with  has a past medical history of Basal cell carcinoma (05/29/2012), Basal cell carcinoma (10/23/2012), Basal cell carcinoma (12/12/2012), Basal cell carcinoma (05/26/2015), Basal cell carcinoma (05/26/2015), Basal cell carcinoma (06/08/2016), Basal cell carcinoma (09/12/2016), Basal cell carcinoma (01/29/2019), Benign neoplasm of colon, Finger fracture, colonoscopy, Hyperlipidemia, Hypertension, Internal hemorrhoids without mention of complication, Myocardial infarction [facility]), Plantar fascial fibromatosis, Prostatitis, Rib fracture, Squamous cell carcinoma of skin (07/13/2009), Squamous cell carcinoma of skin (02/17/2014), Squamous cell carcinoma of skin (01/24/2018), Squamous cell carcinoma of skin (01/24/2018), and Squamous cell carcinoma of skin.   Assessment Plan  See notes Labs reviewed for each problem: Lab Results  Component Value Date   WBC 7.7 07/28/2020   HGB 14.6 07/28/2020   HCT 43.5 07/28/2020   PLT 265.0 07/28/2020   GLUCOSE 98 07/28/2020   CHOL 135 07/28/2020   TRIG 88.0 07/28/2020   HDL 50.50 07/28/2020   LDLDIRECT 144.0 11/21/2010   LDLCALC 67 07/28/2020   ALT 21 07/28/2020   AST 20 07/28/2020   NA 138 07/28/2020   K 4.5 07/28/2020   CL 104 07/28/2020   CREATININE 0.95 07/28/2020   BUN 18 07/28/2020   CO2 28 07/28/2020   TSH 2.82 07/28/2020   PSA 2.75 07/28/2020   HGBA1C 5.7 07/28/2020    Micro: none  Cardiac tracings I have personally interpreted today:  none  Pertinent Radiological findings (summarize): none    Health Maintenance Due  Topic Date Due  . Hepatitis C Screening  Never done    There are no preventive care reminders to display for this patient.   Problem List Items Addressed This Visit      Medium   Vitamin D deficiency     Last vitamin D Lab Results  Component Value Date   VD25OH 26.48 (L) 07/25/2019   To start vit d 2000 u qd      Mixed hyperlipidemia    Lab Results  Component Value Date   LDLCALC 67 07/28/2020  stable overall by history and exam, recent data reviewed with pt, and pt to continue medical treatment as before,  to f/u any worsening symptoms or concerns       Hyperglycemia    stable overall by history and exam, recent data reviewed with pt, and pt to continue medical treatment as before,  to f/u any worsening symptoms or concerns Lab Results  Component Value Date   HGBA1C 5.7 07/28/2020        Relevant Orders   Hemoglobin A1c (Completed)     Unprioritized   Preventative health care - Primary    Overall doing well, age appropriate education and counseling updated, referrals for preventative services and immunizations addressed, dietary and smoking counseling addressed, most recent labs reviewed.  I have personally reviewed and have noted:  1) the patient's  medical and social history 2) The pt's use of alcohol, tobacco, and illicit drugs 3) The patient's current medications and supplements 4) Functional ability including ADL's, fall risk, home safety risk, hearing and visual impairment 5) Diet and physical activities 6) Evidence for depression or mood disorder 7) The patient's height, weight, and BMI have been recorded in the chart  I have made referrals, and provided counseling and education based on review of the above       Relevant Orders   Lipid panel (Completed)   Hepatic function panel (Completed)   CBC with Differential/Platelet (Completed)   TSH (Completed)   Urinalysis, Routine w reflex microscopic (Completed)   PSA (Completed)   Basic metabolic panel (Completed)    Other Visit Diagnoses    Need for hepatitis C screening test       Relevant Orders   Hepatitis C Antibody      Follow-up: 1 yr    Oliver Barre, MD 08/03/2020 3:26 PM [facility]

## 2020-08-19 ENCOUNTER — Ambulatory Visit: Payer: PPO | Admitting: Internal Medicine

## 2020-10-05 ENCOUNTER — Encounter: Payer: Self-pay | Admitting: Physician Assistant

## 2020-10-05 ENCOUNTER — Other Ambulatory Visit: Payer: Self-pay

## 2020-10-05 ENCOUNTER — Ambulatory Visit: Payer: Medicare HMO | Admitting: Physician Assistant

## 2020-10-05 DIAGNOSIS — D485 Neoplasm of uncertain behavior of skin: Secondary | ICD-10-CM | POA: Diagnosis not present

## 2020-10-05 DIAGNOSIS — L570 Actinic keratosis: Secondary | ICD-10-CM | POA: Diagnosis not present

## 2020-10-05 DIAGNOSIS — Z1283 Encounter for screening for malignant neoplasm of skin: Secondary | ICD-10-CM | POA: Diagnosis not present

## 2020-10-05 DIAGNOSIS — B079 Viral wart, unspecified: Secondary | ICD-10-CM | POA: Diagnosis not present

## 2020-10-19 ENCOUNTER — Ambulatory Visit: Payer: Medicare HMO | Admitting: Internal Medicine

## 2020-10-19 ENCOUNTER — Other Ambulatory Visit: Payer: Self-pay

## 2020-10-19 ENCOUNTER — Encounter: Payer: Self-pay | Admitting: Internal Medicine

## 2020-10-19 VITALS — BP 125/76 | HR 52 | Ht 73.0 in | Wt 201.8 lb

## 2020-10-19 DIAGNOSIS — I2510 Atherosclerotic heart disease of native coronary artery without angina pectoris: Secondary | ICD-10-CM | POA: Diagnosis not present

## 2020-10-19 DIAGNOSIS — E782 Mixed hyperlipidemia: Secondary | ICD-10-CM

## 2020-10-19 NOTE — Patient Instructions (Signed)

## 2020-10-19 NOTE — Progress Notes (Signed)
OFFICE NOTE  Chief Complaint:  Annual follow-up  Primary Care Physician: Biagio Borg, MD  HPI:  Billy [name] is a pleasant 80 year old male who unfortunately had a recent anterior MI. He was doing some skiing up in the [facility] and had some chest discomfort. This worsened over the period of 1-2 days and then he presented to the emergency department at [facility]. Subsequently he was found to have a small elevation in troponin consistent with non-ST elevation MI. He was started on heparin aspirin and Lipitor and cardiology was consult it. He underwent cardiac catheterization by Dr. Beryle Flock, which demonstrated a 90% ruptured plaque in the LAD that was visualized by either [place]. He underwent placement of a 3.0 mm x 22 mm integrity resolution drug-eluting stent. This reduced the 90% lesion to 0 and TIMI-3 flow was noted. He was placed on aspirin and Brilinta and felt much better after discharge. He was also placed on metoprolol 25 mg twice a day. Today he returns in follow-up. He has a myriad of questions for me. Most have to do with follow-up, medications, and limitations. He denies any chest pain or worsening shortness of breath since his discharge. Blood pressure stable today 126/82 however heart rate is low at 45.  Billy [name] returns today for follow-up. He started cardiac rehabilitation but is noticed that he gets short of breath particularly with exercise. I received telemetry strips from cardiac rehabilitation which showed significant bradycardia with heart rates in the 40s which was a sinus rhythm. He is noted that when he exercises heart rate rarely gets much above the 80s. This indicates a flat heart rate response to exercise and could suggest a degree of chronotropic incompetence. Billy [name] noted that he had a low heart rate most of his life and was placed on low-dose beta blocker after his MI. Initially was 25 mg twice a day which I reduced to 12-1/2 mg twice a  day. Heart rate at rest today is 50 which is slightly higher than it had been previously. Blood pressure accordingly is gone up some to 132/85. In addition one is concerned about possible shortness of breath related to Brilinta. He was advised to try some caffeine after taking the medicine but noted no difference in his shortness of breath. He denies any angina.  12/10/2015  Billy [name] was seen back in the office today for follow-up. At his last office visit I stopped his Brilinta switched him over to Effient. We also discontinued his beta blocker. He notes a marked improvement of the symptoms in exercise tolerance. Heart rate has correspondingly increased better with exercise. His shortness of breath is totally resolved. Generally feels excellent. Blood pressures been very well controlled even though he is on no blood pressure medications between 518 and 841 systolic. There is not necessarily been enough room to add low-dose ACE inhibitor.  08/11/2016  Billy [name] returns today for follow-up. He says he is feeling well. He denies any chest pain or worsening shortness of breath. He's interested in going skiing. Is a little hesitant because his last MI was associated with skiing about a year ago. Actually his MI was on 09/01/2015, and I advised him that he can discontinue his Effient on that date. He will need to remain on aspirin and atorvastatin. Recent lipid testing showed excellent control with LDL of 52. Particle number less than 1000. He is due for repeat lipid profile. We appreciate discussed a low-dose ACE inhibitor or ARB because  of his history of coronary disease. Beta blocker is not likely to be tolerated due to low heart rate at rest.  08/13/2017  Billy [name] returns today for annual follow-up.  Over the past year he is done very well.  He denies any chest pain or worsening shortness of breath.  He is on minimal medications at this time.  His primary care provider recently repeated his labs 3 weeks  ago showed total cholesterol 135, triglycerides 58, HDL 55 and LDL 69.  His only other complaint today is some irregularity in bowel movements.  He is taking a probiotic.  We discussed some suggestions including adding a bulk fiber agent such as Metamucil to his diet and increasing his water intake.  08/19/2018  Billy [name] is seen today in annual follow-up. He is very active, climbing ladders, doing housework and gets usually 10,000 steps/day. He denies chest pain, dyspnea or associated symptoms.  He recently saw his PCP about a few weeks ago.  Lab work indicated total cholesterol 140, triglycerides 68, HDL 54 and LDL 73.  Overall he is well without complaints.  08/2019  Billy [name] is seen today for an annual follow-up.  He continues to do well and denies chest pain or worsening shortness of breath.  He focuses on try to get more than 10,000 steps per day as previously noted.  I have seen a trend in increasing cholesterol, specifically his LDL is gone from 608 842 1380 and now 86.  He remains on atorvastatin 40 mg daily.  Blood pressure was elevated today however he says at home is generally between 244 and 010 systolic.  10/19/2020 s Billy [name] is seen today in follow-up.  He is doing very well.  He said he was able to move 7 or 8 yards of mulch the other day.  He denies any chest pain or shortness of breath.  He gets his 10,000+ steps.  Heart rate increases with exercise.  EKG shows a sinus bradycardia first-degree AV block today.  Blood pressures well controlled.  After increasing his atorvastatin he is now at target with total cholesterol 135, HDL 50, LDL 67 and triglycerides 88.  PMHx:  Past Medical History:  Diagnosis Date  . Basal cell carcinoma 05/29/2012   left thigh-(EXC)  . Basal cell carcinoma 10/23/2012   right side of nose (MOHS)  . Basal cell carcinoma 12/12/2012   nod-irght cheek (MOHS)  . Basal cell carcinoma 05/26/2015   nod-left calf (CX35FU)  . Basal cell carcinoma 05/26/2015    nod-left nose (txpbx)  . Basal cell carcinoma 06/08/2016   nod-above right brow (CX35FU)  . Basal cell carcinoma 09/12/2016   left nose (MOHS)  . Basal cell carcinoma 01/29/2019   nod-right tip of nose (MOHS)  . Benign neoplasm of colon   . Finger fracture   . Hx of colonoscopy   . Hyperlipidemia   . Hypertension   . Internal hemorrhoids without mention of complication   . Myocardial infarction ([place])    09/01/2015  . Plantar fascial fibromatosis   . Prostatitis   . Rib fracture   . Squamous cell carcinoma of skin 07/13/2009   RIght lower outer leg  . Squamous cell carcinoma of skin 02/17/2014   in situ-right temple (txpbx)  . Squamous cell carcinoma of skin 01/24/2018   in situ-right chest (txpbx)  . Squamous cell carcinoma of skin 01/24/2018   in situ0 right forehead (txpbx)  . Squamous cell carcinoma of skin     Past Surgical History:  Procedure Laterality Date  . cardiac stint  2017  . COLONOSCOPY  2016  . POLYPECTOMY    . TONSILLECTOMY    . VARICOCELECTOMY     Left    FAMHx:  Family History  Problem Relation Age of Onset  . Heart disease Mother        ? CAD. died 54  . Stroke Father        at age 69, smoked cigar  . Diabetes Neg Hx   . Prostate cancer Neg Hx   . Colon cancer Neg Hx   . Stomach cancer Neg Hx   . Rectal cancer Neg Hx   . Colon polyps Neg Hx   . Esophageal cancer Neg Hx     SOCHx:   reports that he quit smoking about 42 years ago. His smoking use included cigarettes. He has a 20.00 pack-year smoking history. He has never used smokeless tobacco. He reports current alcohol use of about 25.0 standard drinks of alcohol per week. He reports that he does not use drugs.  ALLERGIES:  Allergies  Allergen Reactions  . Ofloxacin     REACTION: rash    ROS: Pertinent items noted in HPI and remainder of comprehensive ROS otherwise negative.  HOME MEDS: Current Outpatient Medications  Medication Sig Dispense Refill  . aspirin EC 81 MG tablet  Take 81 mg by mouth daily.    Marland Kitchen atorvastatin (LIPITOR) 80 MG tablet Take 1 tablet (80 mg total) by mouth daily at 6 PM. 90 tablet 3  . FLUZONE HIGH-DOSE QUADRIVALENT 0.7 ML SUSY     . lisinopril (ZESTRIL) 2.5 MG tablet Take 1 tablet (2.5 mg total) by mouth daily. 90 tablet 3  . nitroGLYCERIN (NITROSTAT) 0.4 MG SL tablet Place 1 tablet (0.4 mg total) under the tongue every 5 (five) minutes as needed for chest pain. Max 3 doses. 25 tablet 3  . Omega-3 Fatty Acids (FISH OIL) 500 MG CAPS 1 capsule     No current facility-administered medications for this visit.    LABS/IMAGING: No results found for this or any previous visit (from the past 48 hour(s)). No results found.  WEIGHTS: Wt Readings from Last 3 Encounters:  10/19/20 201 lb 12.8 oz (91.5 kg)  07/28/20 200 lb (90.7 kg)  01/30/20 201 lb 9.6 oz (91.4 kg)    VITALS: BP 125/76   Pulse (!) 52   Ht 6' 1"  (1.854 m)   Wt 201 lb 12.8 oz (91.5 kg)   SpO2 100%   BMI 26.62 kg/m   EXAM: General appearance: alert and no distress Neck: no carotid bruit, no JVD and thyroid not enlarged, symmetric, no tenderness/mass/nodules Lungs: clear to auscultation bilaterally Heart: regular rate and rhythm, S1, S2 normal, no murmur, click, rub or gallop Abdomen: soft, non-tender; bowel sounds normal; no masses,  no organomegaly Extremities: extremities normal, atraumatic, no cyanosis or edema Pulses: 2+ and symmetric Skin: Skin color, texture, turgor normal. No rashes or lesions Neurologic: Grossly normal Psych: Pleasant  EKG: Sinus bradycardia with AV block at 52-personally reviewed  ASSESSMENT: 1. CAD status post DES to the proximal LAD for non-STEMI (08/2015) 2. Dyslipidemia 3. Anxiety 4. H/o chronotropic incompetence- off beta blocker d/t this  PLAN: 1.   Billy [name] is doing very well without any recurrent coronary symptoms.  He was able to move many yards of mulch with a wheelbarrow going up and down hills without chest pain or  shortness of breath.  His cholesterol is now at target with goal  LDL less than 70.  Heart rate does increase with exercise.  Blood pressures well controlled.  No medication changes today.  Follow-up with me annually or sooner as necessary.  Pixie Casino, MD, [facility], [place] Director of the [facility] Lipid [facility] &  Cardiovascular [facility] Diplomate of the American Board of Clinical Lipidology Attending Cardiologist  Direct Dial: [phone]  Fax: [phone]  Website:  [email or web address] 10/19/2020, 10:25 AM

## 2020-10-25 ENCOUNTER — Encounter: Payer: Self-pay | Admitting: Physician Assistant

## 2020-10-25 NOTE — Progress Notes (Signed)
   Follow-Up Visit   Subjective  Billy [name] is a 80 y.o. male who presents for the following: Annual Exam (Full body skin check. Follow up on left leg scc that was exc. ).   The following portions of the chart were reviewed this encounter and updated as appropriate:  Tobacco  Allergies  Meds  Problems  Med Hx  Surg Hx  Fam Hx      Objective  Well appearing patient in no apparent distress; mood and affect are within normal limits.  A full examination was performed including scalp, head, eyes, ears, nose, lips, neck, chest, axillae, abdomen, back, buttocks, bilateral upper extremities, bilateral lower extremities, hands, feet, fingers, toes, fingernails, and toenails. All findings within normal limits unless otherwise noted below.  Objective  head to toe: No atypical nevi Waist up skin examination  Objective  Neck - Anterior: Pink pearly papule     Objective  Left Forehead, Mid Forehead (4), Mid Frontal Scalp, Mid Parietal Scalp, Right Forehead: Erythematous patches with gritty scale.   Assessment & Plan  Encounter for screening for malignant neoplasm of skin head to toe  Yearly skin check  Neoplasm of uncertain behavior of skin Neck - Anterior  Skin / nail biopsy Type of biopsy: tangential   Informed consent: discussed and consent obtained   Timeout: patient name, date of birth, surgical site, and procedure verified   Procedure prep:  Patient was prepped and draped in usual sterile fashion (Non sterile) Prep type:  Chlorhexidine Anesthesia: the lesion was anesthetized in a standard fashion   Anesthetic:  1% lidocaine w/ epinephrine 1-100,000 local infiltration Instrument used: flexible razor blade   Outcome: patient tolerated procedure well   Post-procedure details: wound care instructions given    Specimen 1 - Surgical pathology Differential Diagnosis: bcc vs scc, wart  Check Margins: No  AK (actinic keratosis) (8) Mid Frontal Scalp; Mid Parietal  Scalp; Mid Forehead (4); Left Forehead; Right Forehead  Destruction of lesion - Left Forehead, Mid Forehead (4), Mid Frontal Scalp, Mid Parietal Scalp, Right Forehead Complexity: simple   Destruction method: cryotherapy   Informed consent: discussed and consent obtained   Timeout:  patient name, date of birth, surgical site, and procedure verified Lesion destroyed using liquid nitrogen: Yes   Cryotherapy cycles:  3 Outcome: patient tolerated procedure well with no complications   Post-procedure details: wound care instructions given      I, Tukker Byrns, PA-C, have reviewed all documentation's for this visit.  The documentation on 10/25/20 for the exam, diagnosis, procedures and orders are all accurate and complete.

## 2020-11-30 ENCOUNTER — Telehealth: Payer: Self-pay | Admitting: Internal Medicine

## 2020-11-30 NOTE — Telephone Encounter (Signed)
1.Medication Requested:  lisinopril (ZESTRIL) 2.5 MG tablet    2. Pharmacy ([facility]): [facility]  Fax: [phone]  3. On Med List: yes   4. Last Visit with PCP: 07-28-20  5. Next visit date with PCP: 08-02-21   Agent: Please be advised that RX refills may take up to 3 business days. We ask that you follow-up with your pharmacy.

## 2020-12-01 ENCOUNTER — Telehealth: Payer: Self-pay | Admitting: Internal Medicine

## 2020-12-01 MED ORDER — LISINOPRIL 2.5 MG PO TABS: 2.5000 mg | ORAL_TABLET | Freq: Every day | ORAL | 3 refills | Status: DC

## 2020-12-01 NOTE — Telephone Encounter (Signed)
LVM with pt's wife for pt to rtn my call to schedule AWV with [facility]. Please schedule this appt if pt calls the office.

## 2021-01-27 ENCOUNTER — Other Ambulatory Visit: Payer: Self-pay

## 2021-01-27 ENCOUNTER — Telehealth: Payer: Medicare HMO | Admitting: Internal Medicine

## 2021-01-27 DIAGNOSIS — E782 Mixed hyperlipidemia: Secondary | ICD-10-CM

## 2021-01-27 MED ORDER — ATORVASTATIN CALCIUM 80 MG PO TABS: 80.0000 mg | ORAL_TABLET | Freq: Every day | ORAL | 3 refills | Status: AC

## 2021-01-27 NOTE — Telephone Encounter (Signed)
RX sent to pharmacy.  Patient recently seen 12 month follow up suggested.

## 2021-01-27 NOTE — Telephone Encounter (Signed)
*  STAT* If patient is at the pharmacy, call can be transferred to refill team.   1. Which medications need to be refilled? (please list name of each medication and dose if known) atorvastatin (LIPITOR) 80 MG tablet  2. Which pharmacy/location (including street and city if local pharmacy) is medication to be sent to? [facility] [place] [place] AT Portal to Registered [employer] Sites  3. Do they need a 30 day or 90 day supply? [place]

## 2021-01-28 ENCOUNTER — Other Ambulatory Visit: Payer: Self-pay

## 2021-03-08 ENCOUNTER — Ambulatory Visit: Payer: Medicare HMO | Admitting: Physician Assistant

## 2021-03-08 ENCOUNTER — Other Ambulatory Visit: Payer: Self-pay

## 2021-03-08 ENCOUNTER — Encounter: Payer: Self-pay | Admitting: Physician Assistant

## 2021-03-08 DIAGNOSIS — D485 Neoplasm of uncertain behavior of skin: Secondary | ICD-10-CM | POA: Diagnosis not present

## 2021-03-08 DIAGNOSIS — L570 Actinic keratosis: Secondary | ICD-10-CM | POA: Diagnosis not present

## 2021-03-08 NOTE — Progress Notes (Signed)
   Follow-Up Visit   Subjective  Billy [name] is a 80 y.o. male who presents for the following: Skin Problem (Had a lesion on left lower leg he wanted checked. But it is now getting better per patient. ).   The following portions of the chart were reviewed this encounter and updated as appropriate:  Tobacco  Allergies  Meds  Problems  Med Hx  Surg Hx  Fam Hx      Objective  Well appearing patient in no apparent distress; mood and affect are within normal limits.  A full examination was performed including scalp, head, eyes, ears, nose, lips, neck, chest, axillae, abdomen, back, buttocks, bilateral upper extremities, bilateral lower extremities, hands, feet, fingers, toes, fingernails, and toenails. All findings within normal limits unless otherwise noted below.  Left Lower Leg - Anterior Hyperkeratotic scale with pink base         Assessment & Plan  Neoplasm of uncertain behavior of skin Left Lower Leg - Anterior  Skin / nail biopsy Type of biopsy: tangential   Informed consent: discussed and consent obtained   Timeout: patient name, date of birth, surgical site, and procedure verified   Procedure prep:  Patient was prepped and draped in usual sterile fashion (Non sterile) Prep type:  Chlorhexidine Anesthesia: the lesion was anesthetized in a standard fashion   Anesthetic:  1% lidocaine w/ epinephrine 1-100,000 local infiltration Instrument used: flexible razor blade   Outcome: patient tolerated procedure well   Post-procedure details: wound care instructions given    Destruction of lesion Complexity: simple   Destruction method: cryotherapy   Informed consent: discussed and consent obtained   Timeout:  patient name, date of birth, surgical site, and procedure verified Lesion destroyed using liquid nitrogen: Yes   Cryotherapy cycles:  3 Final wound size (cm):  1 Outcome: patient tolerated procedure well with no complications    Specimen 1 - Surgical  pathology Differential Diagnosis: bcc vs scc- txpbx  Check Margins: No    I, Markcus Lazenby, PA-C, have reviewed all documentation's for this visit.  The documentation on 03/08/21 for the exam, diagnosis, procedures and orders are all accurate and complete.

## 2021-03-08 NOTE — Patient Instructions (Signed)

## 2021-03-15 ENCOUNTER — Telehealth: Payer: Self-pay

## 2021-03-15 NOTE — Telephone Encounter (Signed)
Phone call to patient with his pathology results. Voicemail left for patient to give the office a call back.  ?

## 2021-03-15 NOTE — Telephone Encounter (Signed)
-----   Message from Warren Danes, [place] sent at 03/15/2021 10:11 AM EDT ----- Tx with biopsy. RTC if recurs

## 2021-03-16 ENCOUNTER — Telehealth: Payer: Self-pay | Admitting: Physician Assistant

## 2021-03-16 NOTE — Telephone Encounter (Signed)
Returned call for results 

## 2021-03-16 NOTE — Telephone Encounter (Signed)
Tx with bx ak patient aware.

## 2021-07-01 ENCOUNTER — Encounter (HOSPITAL_COMMUNITY): Payer: Self-pay

## 2021-07-01 ENCOUNTER — Emergency Department (HOSPITAL_COMMUNITY): Payer: Medicare HMO

## 2021-07-01 ENCOUNTER — Emergency Department (HOSPITAL_COMMUNITY)
Admission: EM | Admit: 2021-07-01 | Discharge: 2021-07-01 | Disposition: A | Discharge status: Home / Self Care | Payer: Medicare HMO | Attending: Emergency Medicine | Admitting: Emergency Medicine

## 2021-07-01 ENCOUNTER — Other Ambulatory Visit: Payer: Self-pay

## 2021-07-01 DIAGNOSIS — R911 Solitary pulmonary nodule: Secondary | ICD-10-CM | POA: Diagnosis not present

## 2021-07-01 DIAGNOSIS — C229 Malignant neoplasm of liver, not specified as primary or secondary: Secondary | ICD-10-CM | POA: Diagnosis not present

## 2021-07-01 DIAGNOSIS — I7121 Aneurysm of the ascending aorta, without rupture: Secondary | ICD-10-CM | POA: Diagnosis not present

## 2021-07-01 DIAGNOSIS — R109 Unspecified abdominal pain: Secondary | ICD-10-CM | POA: Diagnosis not present

## 2021-07-01 DIAGNOSIS — R7989 Other specified abnormal findings of blood chemistry: Secondary | ICD-10-CM | POA: Diagnosis not present

## 2021-07-01 DIAGNOSIS — Z85828 Personal history of other malignant neoplasm of skin: Secondary | ICD-10-CM | POA: Diagnosis not present

## 2021-07-01 DIAGNOSIS — I712 Thoracic aortic aneurysm, without rupture, unspecified: Secondary | ICD-10-CM | POA: Diagnosis not present

## 2021-07-01 DIAGNOSIS — C787 Secondary malignant neoplasm of liver and intrahepatic bile duct: Secondary | ICD-10-CM

## 2021-07-01 DIAGNOSIS — Z79899 Other long term (current) drug therapy: Secondary | ICD-10-CM | POA: Diagnosis not present

## 2021-07-01 DIAGNOSIS — I10 Essential (primary) hypertension: Secondary | ICD-10-CM | POA: Insufficient documentation

## 2021-07-01 DIAGNOSIS — R079 Chest pain, unspecified: Secondary | ICD-10-CM | POA: Diagnosis not present

## 2021-07-01 DIAGNOSIS — I2510 Atherosclerotic heart disease of native coronary artery without angina pectoris: Secondary | ICD-10-CM | POA: Diagnosis not present

## 2021-07-01 DIAGNOSIS — K769 Liver disease, unspecified: Secondary | ICD-10-CM | POA: Diagnosis not present

## 2021-07-01 DIAGNOSIS — I700 Atherosclerosis of aorta: Secondary | ICD-10-CM | POA: Diagnosis not present

## 2021-07-01 DIAGNOSIS — Z87891 Personal history of nicotine dependence: Secondary | ICD-10-CM | POA: Diagnosis not present

## 2021-07-01 DIAGNOSIS — R0789 Other chest pain: Secondary | ICD-10-CM | POA: Diagnosis not present

## 2021-07-01 DIAGNOSIS — Z7982 Long term (current) use of aspirin: Secondary | ICD-10-CM | POA: Diagnosis not present

## 2021-07-01 DIAGNOSIS — K7689 Other specified diseases of liver: Secondary | ICD-10-CM | POA: Diagnosis not present

## 2021-07-01 LAB — HEPATIC FUNCTION PANEL
ALT: 29 U/L (ref 0–44)
AST: 64 U/L — ABNORMAL HIGH (ref 15–41)
Albumin: 3.2 g/dL — ABNORMAL LOW (ref 3.5–5.0)
Alkaline Phosphatase: 105 U/L (ref 38–126)
Bilirubin, Direct: <0.1 mg/dL (ref 0.0–0.2)
Total Bilirubin: 1.2 mg/dL (ref 0.3–1.2)
Total Protein: 6 g/dL — ABNORMAL LOW (ref 6.5–8.1)

## 2021-07-01 LAB — CBC
HCT: 41 % (ref 39.0–52.0)
Hemoglobin: 13.5 g/dL (ref 13.0–17.0)
MCH: 31.6 pg (ref 26.0–34.0)
MCHC: 32.9 g/dL (ref 30.0–36.0)
MCV: 96 fL (ref 80.0–100.0)
Platelets: 260 10*3/uL (ref 150–400)
RBC: 4.27 MIL/uL (ref 4.22–5.81)
RDW: 12.5 % (ref 11.5–15.5)
WBC: 9.8 10*3/uL (ref 4.0–10.5)
nRBC: 0 % (ref 0.0–0.2)

## 2021-07-01 LAB — BASIC METABOLIC PANEL
Anion gap: 6 (ref 5–15)
BUN: 16 mg/dL (ref 8–23)
CO2: 25 mmol/L (ref 22–32)
Calcium: 8.9 mg/dL (ref 8.9–10.3)
Chloride: 106 mmol/L (ref 98–111)
Creatinine, Ser: 0.88 mg/dL (ref 0.61–1.24)
GFR, Estimated: >60 mL/min (ref >60)
Glucose, Bld: 118 mg/dL — ABNORMAL HIGH (ref 70–99)
Potassium: 4.7 mmol/L (ref 3.5–5.1)
Sodium: 137 mmol/L (ref 135–145)

## 2021-07-01 LAB — TROPONIN I (HIGH SENSITIVITY)
Troponin I (High Sensitivity): 6 ng/L (ref <18)
Troponin I (High Sensitivity): 7 ng/L (ref <18)

## 2021-07-01 LAB — D-DIMER, QUANTITATIVE: D-Dimer, Quant: 2.38 ug/mL-FEU — ABNORMAL HIGH (ref 0.00–0.50)

## 2021-07-01 LAB — LIPASE, BLOOD: Lipase: 26 U/L (ref 11–51)

## 2021-07-01 IMAGING — DX DG CHEST 1V PORT
1 series · 1 of 1 positions shown · non-contrast
Comparison: [DATE]

CLINICAL DATA: 80-year-old male with chest pain.

EXAM:
PORTABLE CHEST - 1 VIEW

[chest ap]
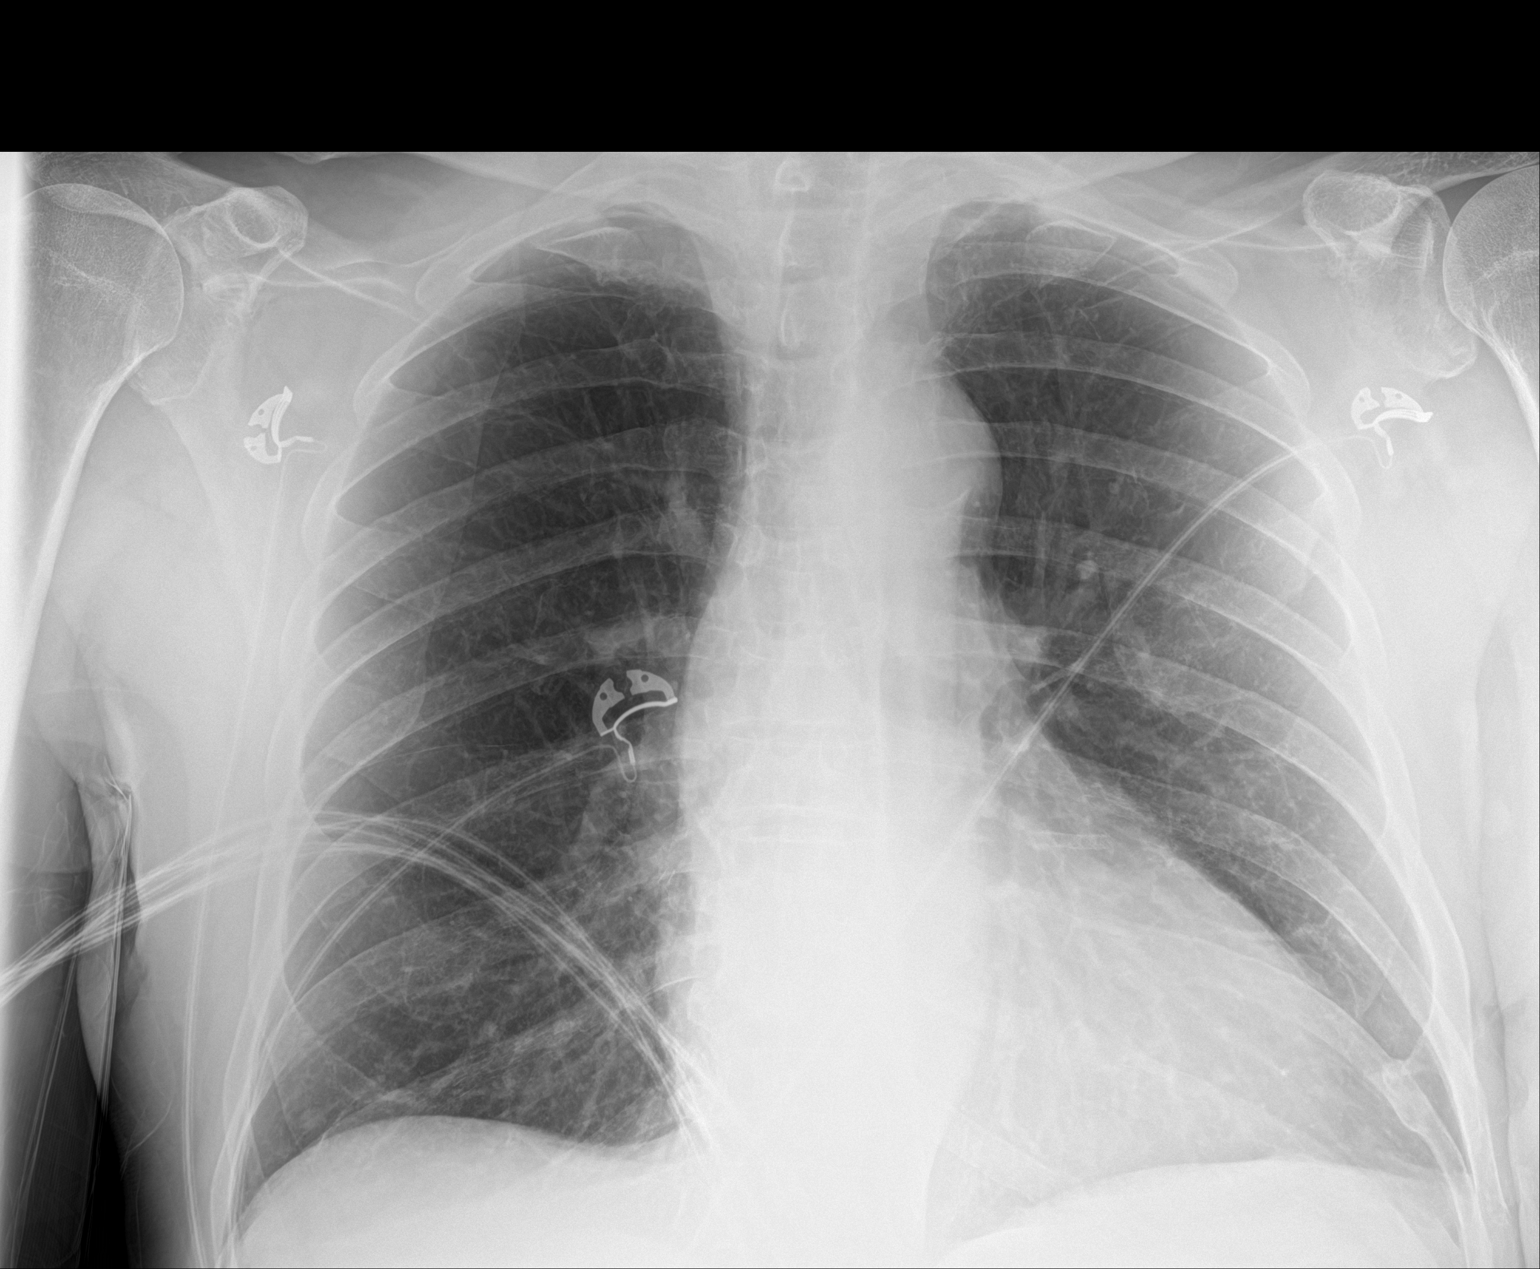

[1 of 1 positions shown; findings below may reference images not displayed]

FINDINGS: The mediastinal contours are within normal limits. No cardiomegaly.
Coronary artery stent is visualized. Atherosclerotic calcification
of the aortic arch. The lungs are clear bilaterally without evidence
of focal consolidation, pleural effusion, or pneumothorax. No acute
osseous abnormality.
IMPRESSION: No acute cardiopulmonary process. Aortic Atherosclerosis
([VO]-[VO]).

## 2021-07-01 IMAGING — CT CT ABD-PELV W/ CM
2 of 5 series · 16 of 46 positions shown, 18 images · IV contrast (omnipaque)
Comparison: CT angiogram chest [DATE].

CLINICAL DATA: Abdominal pain, acute.

EXAM:
CT ABDOMEN AND PELVIS WITH CONTRAST
TECHNIQUE: Multidetector CT imaging of the abdomen and pelvis was performed
using the standard protocol following bolus administration of
intravenous contrast.
CONTRAST:  100mL OMNIPAQUE IOHEXOL 300 MG/ML  SOLN

[Series 3: abd/ pelvis 5.0 i30f 2 · axial · 0.83mm/px · z∈[+716,+1106]mm · 13 of 88 slices shown, 15 images]
[im 5/88  soft-tissue]
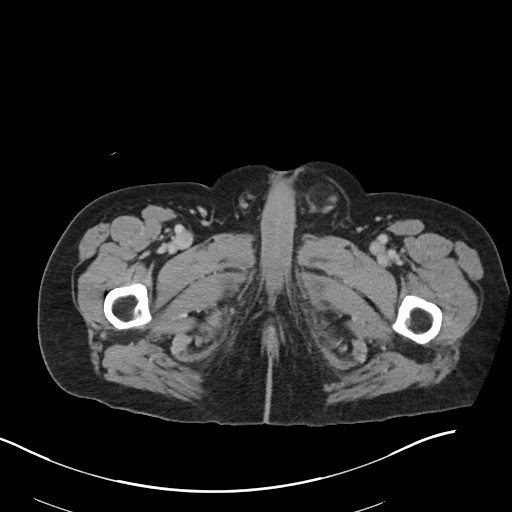
[im 5/88  bone]
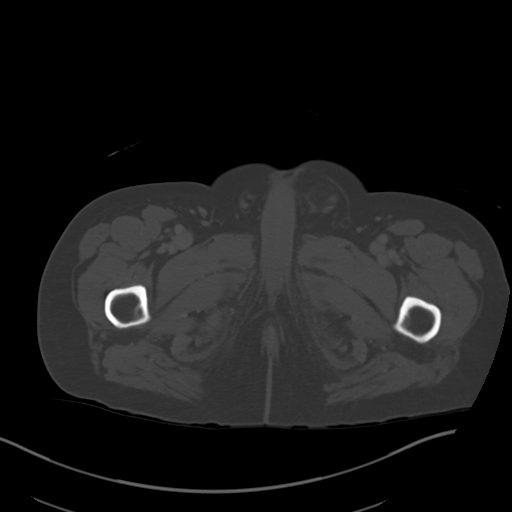
[im 10/88  soft-tissue]
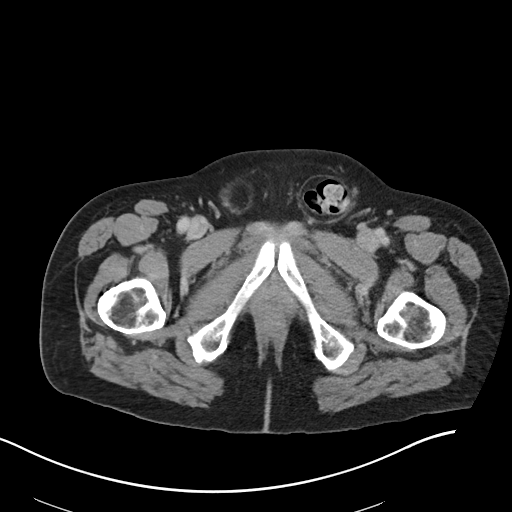
[im 20/88  soft-tissue]
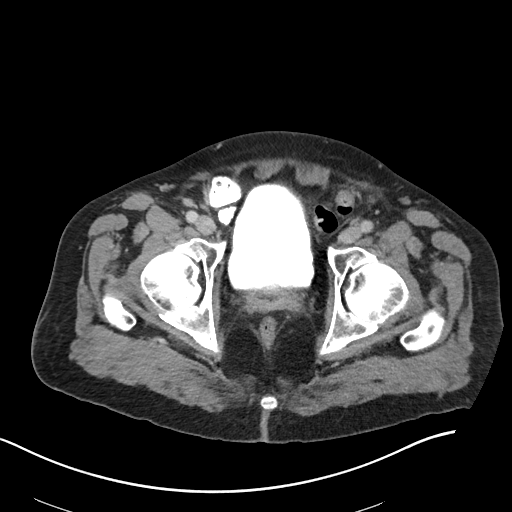
[im 25/88  soft-tissue]
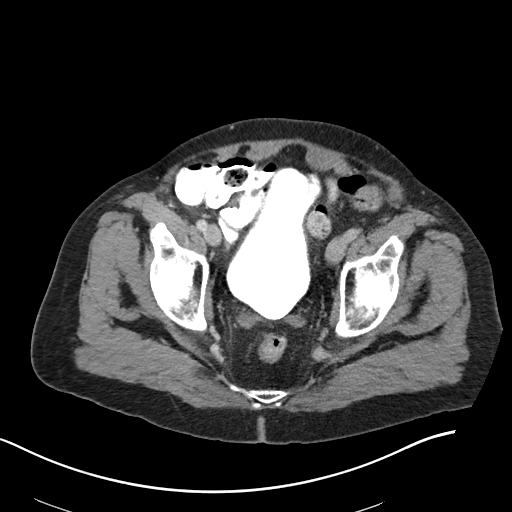
[im 30/88  soft-tissue]
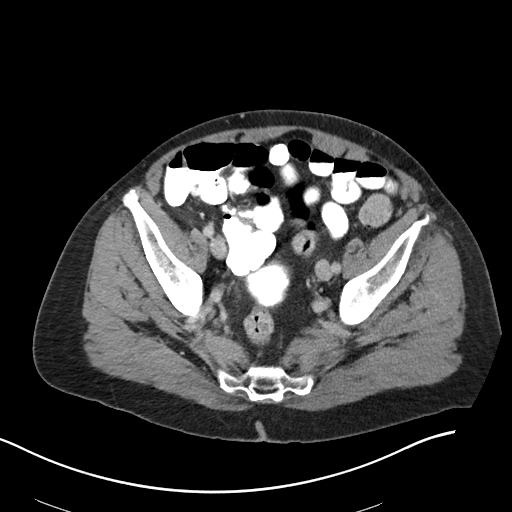
[im 39/88  soft-tissue]
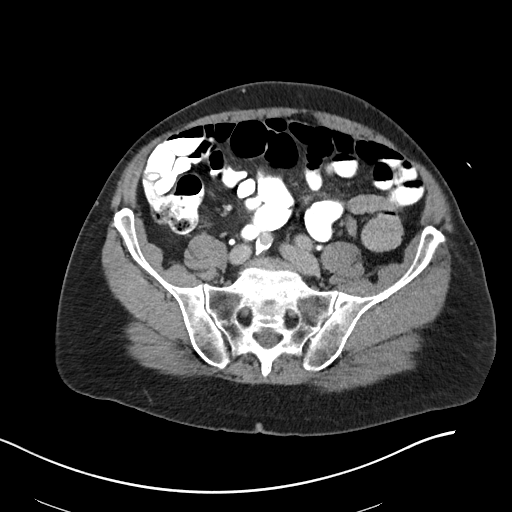
[im 44/88  soft-tissue]
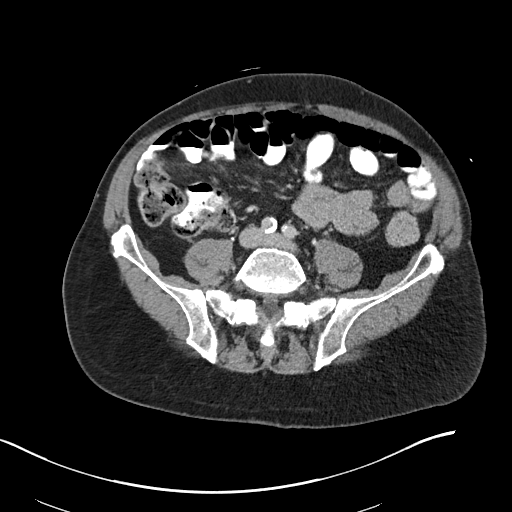
[im 49/88  soft-tissue]
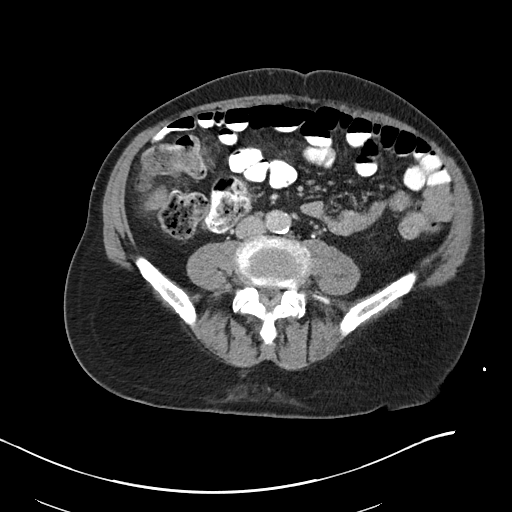
[im 59/88  soft-tissue]
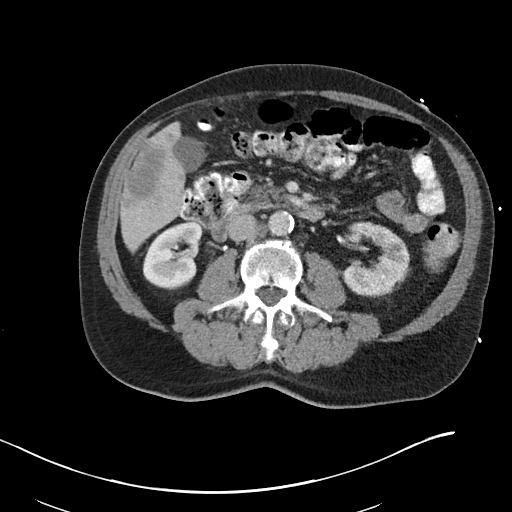
[im 59/88  bone]
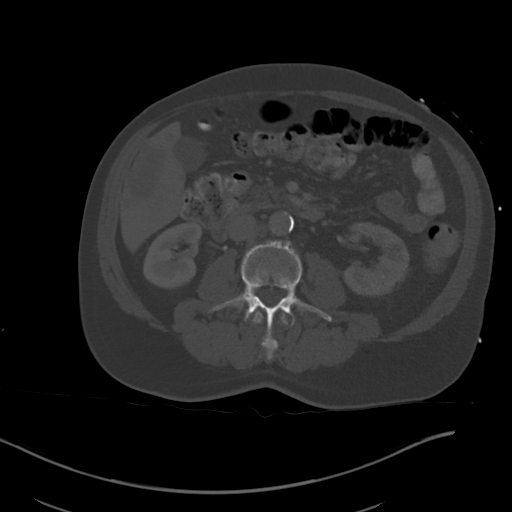
[im 63/88  soft-tissue]
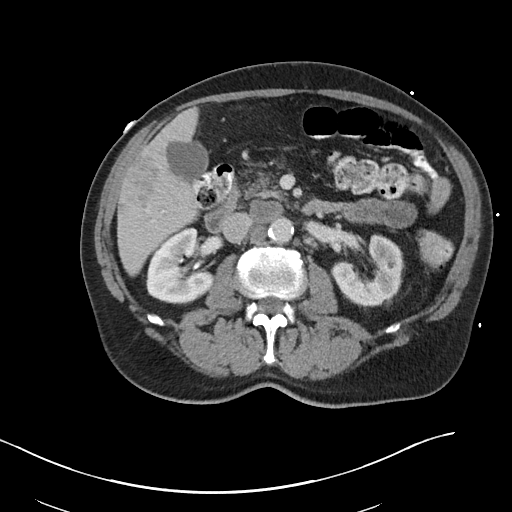
[im 68/88  soft-tissue]
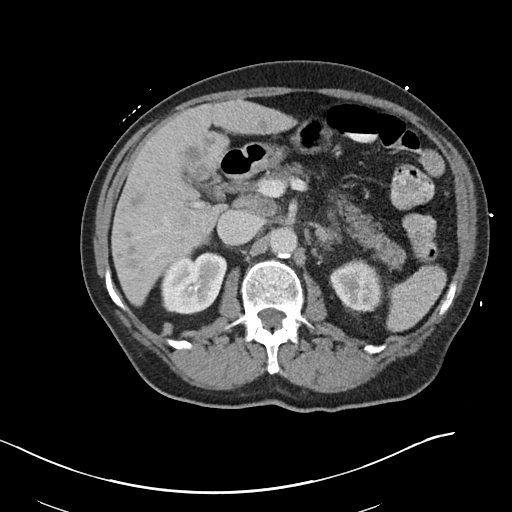
[im 78/88  soft-tissue]
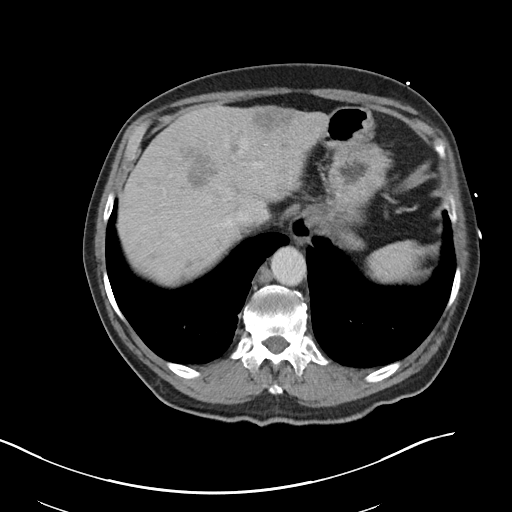
[im 83/88  soft-tissue]
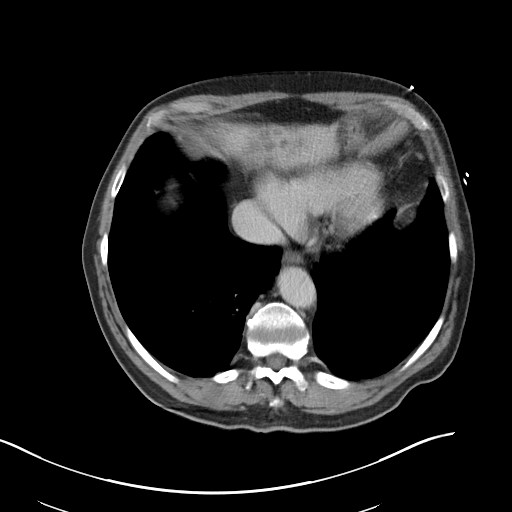

[Series 6: coronal soft tissue · coronal · 0.81mm/px · 3 of 107 slices shown]
[im 36/107  soft-tissue]
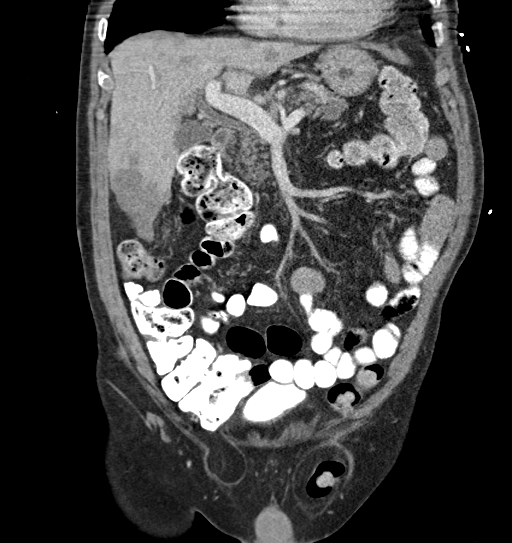
[im 48/107  soft-tissue]
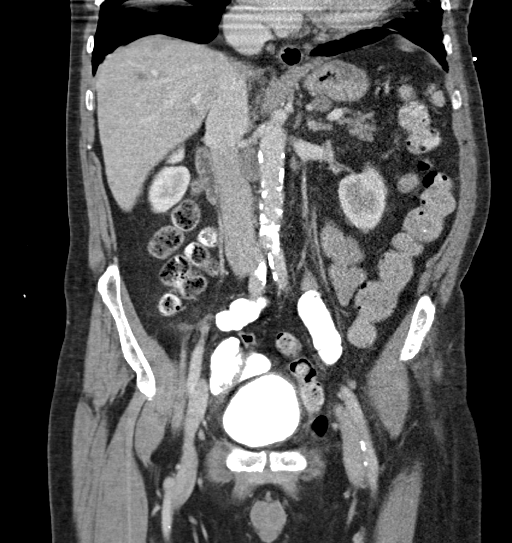
[im 59/107  soft-tissue]
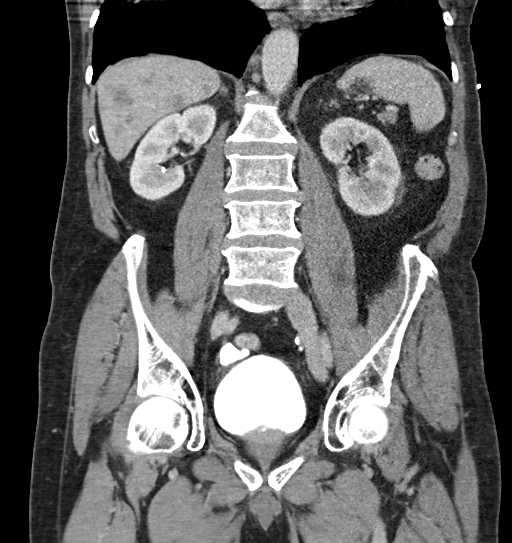

[16 of 46 positions shown; findings below may reference images not displayed]

FINDINGS: Lower chest: No acute abnormality.

Hepatobiliary: There are numerous hypodense liver lesions. The
largest lesion is seen in the inferior right lobe measuring 5.9 x
3.0 x 4.7 cm. The gallbladder bile ducts are within normal limits.

Pancreas: Unremarkable. No pancreatic ductal dilatation or
surrounding inflammatory changes.

Spleen: Normal in size without focal abnormality.

Adrenals/Urinary Tract: Adrenal glands are unremarkable. Kidneys are
normal, without renal calculi, focal lesion, or hydronephrosis.
There are hypodensities in the left kidney which are too small to
characterize, most likely cysts. Otherwise, the kidneys, adrenal
glands and bladder are within normal limits.

Stomach/Bowel: Stomach is within normal limits. No evidence of bowel
wall thickening, distention, or inflammatory changes. The appendix
is not seen.

Vascular/Lymphatic: Abdominal aorta is normal in size. There are
atherosclerotic calcifications of the aorta.

There are enlarged periportal lymph nodes, low-density, measuring up
to 2.8 x 2.2 cm. There is an enlarged portacaval lymph node
measuring 3.6 x 1.3 cm. There is an enlarged peripancreatic lymph
node measuring 2.0 x 1.5 cm image [DATE]. There are enlarged
aortocaval lymph nodes measuring up to 2.7 x 1.8 cm.

Reproductive: Prostate is unremarkable.

Other: There is a small fat containing right inguinal hernia. There
is a moderate-sized left inguinal hernia containing nondilated
colon. There is no ascites.

Musculoskeletal: No acute or significant osseous findings.
IMPRESSION: 1. Numerous hypodense hepatic lesions with dominant lesion in the
inferior right lobe measuring up to 5.9 cm. Findings worrisome for
diffuse metastatic disease. Primary hepatic neoplasm not excluded in
the inferior right lobe.
2. Pathologically enlarged periportal, portal caval, peripancreatic
and aortocaval lymph nodes worrisome for metastatic disease.
3. Bilateral inguinal hernias. Left inguinal hernia contains colon.
No bowel obstruction.
4.  Aortic Atherosclerosis ([MJ]-[MJ]).

## 2021-07-01 IMAGING — CT CT ANGIO CHEST
2 of 6 series · 17 of 46 positions shown · IV contrast (omnipaque)
Comparison: Current chest radiograph.

CLINICAL DATA: Positive D-dimer.  Chest pain.

EXAM:
CT ANGIOGRAPHY CHEST WITH CONTRAST
TECHNIQUE: Multidetector CT imaging of the chest was performed using the
standard protocol during bolus administration of intravenous
contrast. Multiplanar CT image reconstructions and MIPs were
obtained to evaluate the vascular anatomy.
CONTRAST:  50mL OMNIPAQUE IOHEXOL 350 MG/ML SOLN

[Series 6: thins · axial · 0.84mm/px · z∈[+1267,+1554]mm · 14 of 315 slices shown]
[im 14/315  lung]
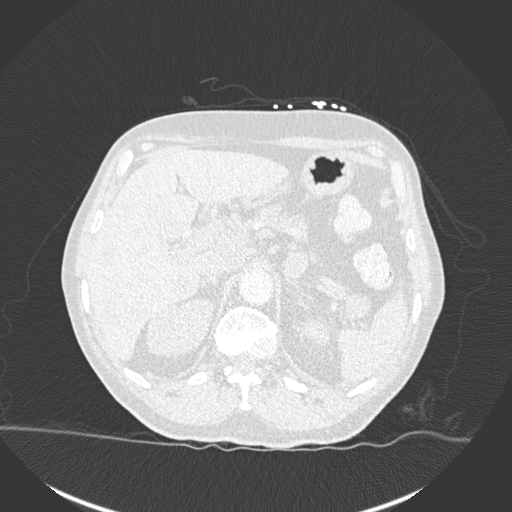
[im 41/315  soft-tissue]
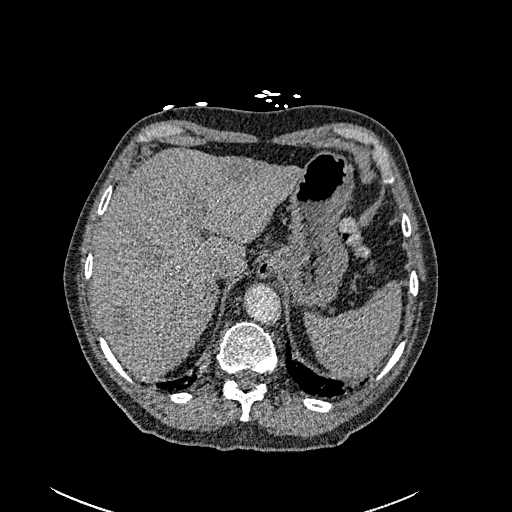
[im 55/315  lung]
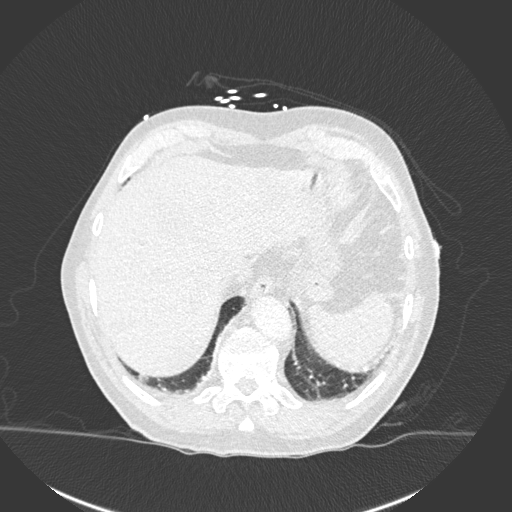
[im 82/315  soft-tissue]
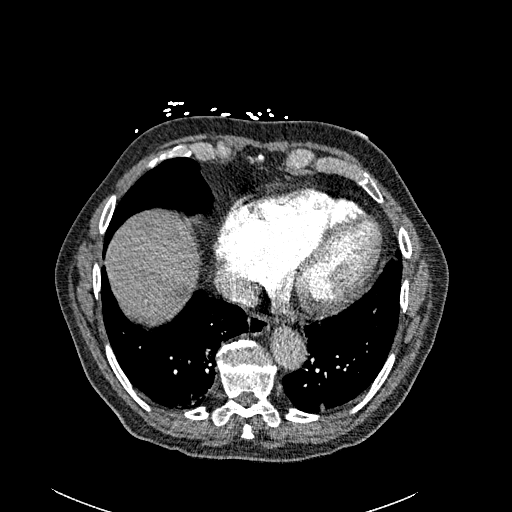
[im 110/315  lung]
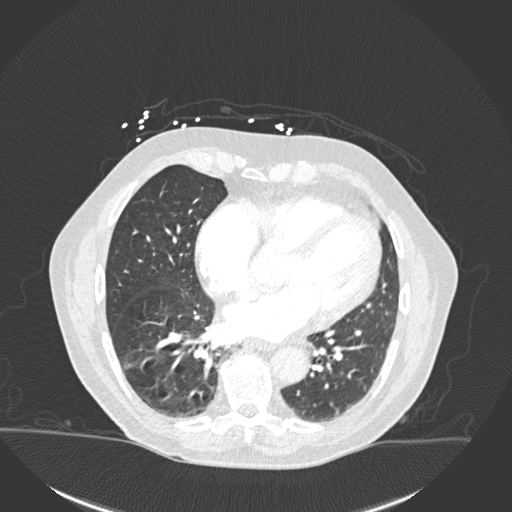
[im 123/315  soft-tissue]
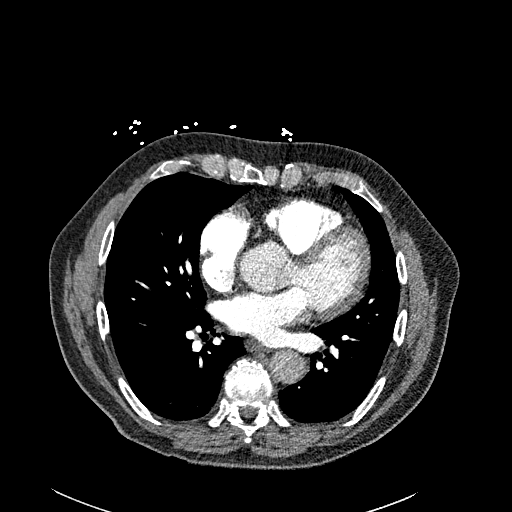
[im 151/315  lung]
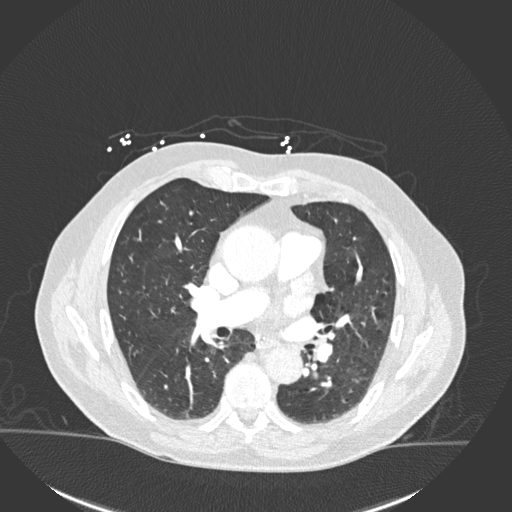
[im 164/315  soft-tissue]
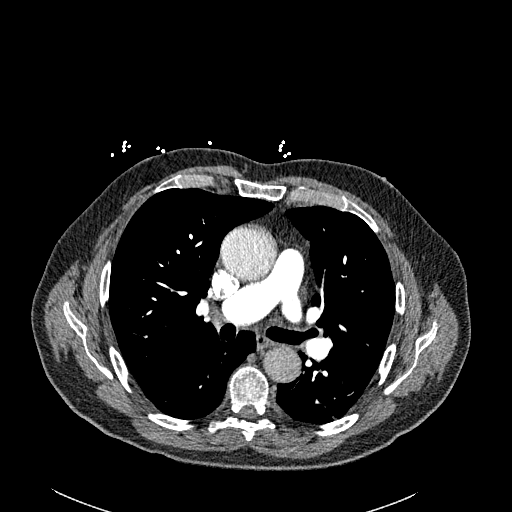
[im 192/315  lung]
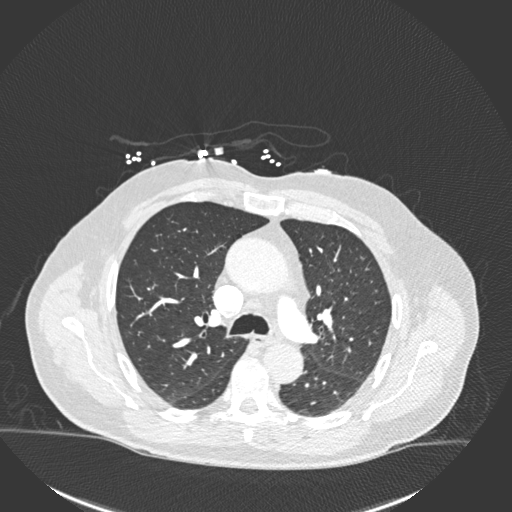
[im 205/315  soft-tissue]
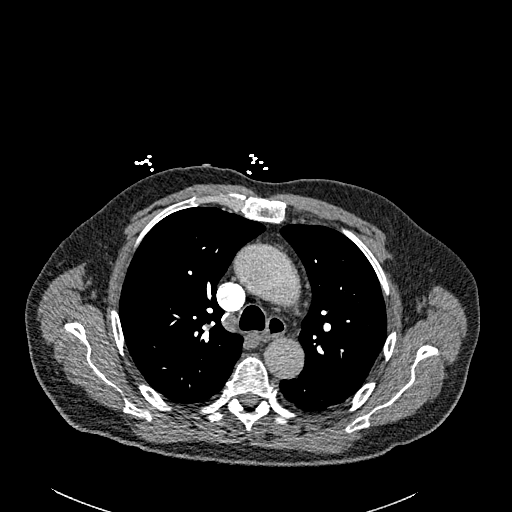
[im 233/315  lung]
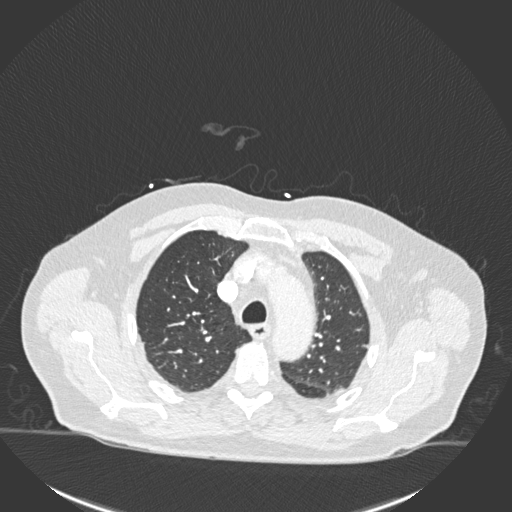
[im 260/315  soft-tissue]
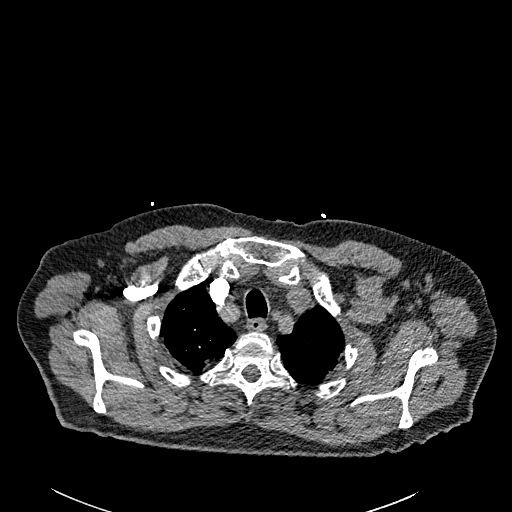
[im 274/315  lung]
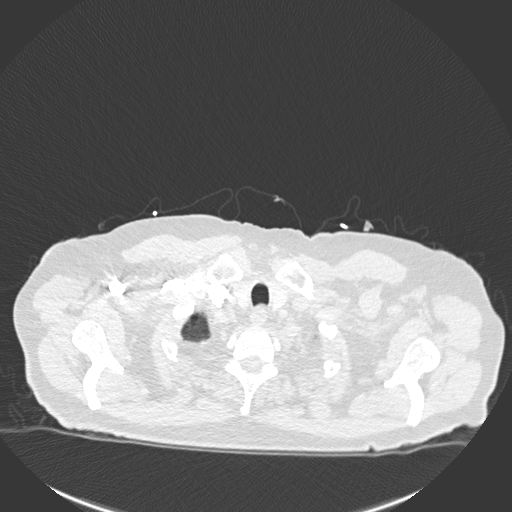
[im 301/315  soft-tissue]
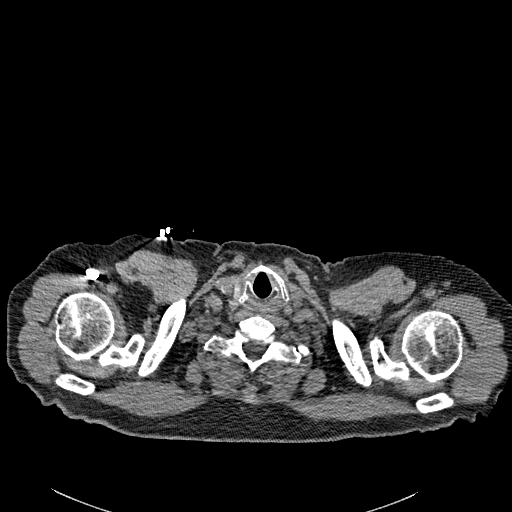

[Series 8: coronal mpr · coronal · 0.62mm/px · 3 of 151 slices shown]
[im 38/151  soft-tissue]
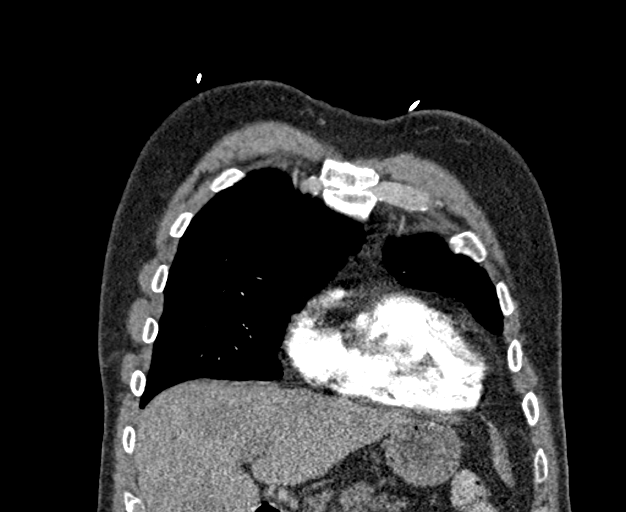
[im 76/151  soft-tissue]
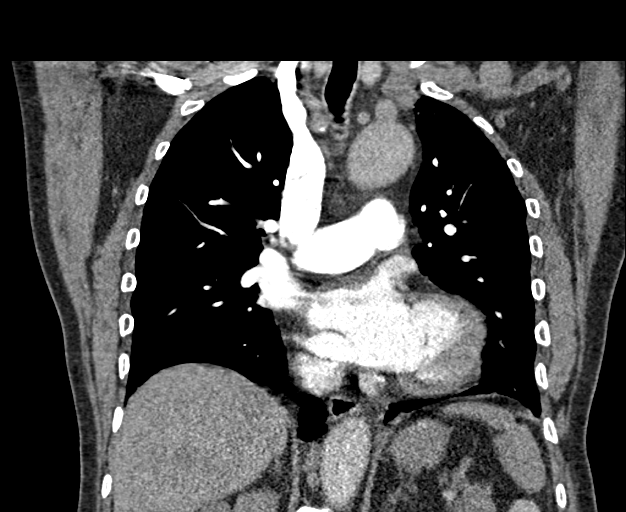
[im 113/151  soft-tissue]
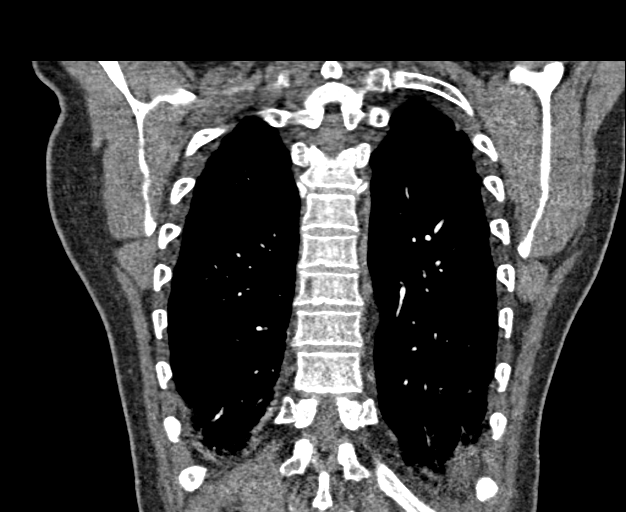

[17 of 46 positions shown; findings below may reference images not displayed]

FINDINGS: Cardiovascular: Pulmonary arteries are well opacified. There is no
evidence of a pulmonary embolism.

Heart mildly enlarged. No pericardial effusion. Left coronary artery
calcifications. Ascending thoracic aorta measures 4.7 cm in
diameter. Mild aortic atherosclerosis. No convincing dissection.

Mediastinum/Nodes: No enlarged mediastinal, hilar, or axillary lymph
nodes. Thyroid gland, trachea, and esophagus demonstrate no
significant findings.

Lungs/Pleura: Multiple subcentimeter lung nodules, largest at the
diaphragmatic base of the right middle lobe and subpleural left
lower lobe, image 67, series 7, both 5 mm.

No lung consolidation to suggest pneumonia. No evidence of pulmonary
edema. Mild apical pleuroparenchymal scarring.

No pleural effusion or pneumothorax.

Upper Abdomen: Ill-defined and partly imaged mass along the inferior
porta hepatis, suspicious for a pancreatic neoplasm. Margins are
ill-defined. There are multiple low-attenuation liver masses,
scattered throughout the liver, largest in the anterior aspect of
the left lobe lateral segment, bulging the overlying surface
contour, 4 cm in greatest dimension. Prominent Peri celiac lymph
nodes, largest on the left measuring 1.7 cm in short axis.

Musculoskeletal: No acute fracture or acute finding. No osteoblastic
or osteolytic lesions. No chest wall masses.

Review of the MIP images confirms the above findings.
IMPRESSION: 1. No evidence of a pulmonary embolism.
2. Findings consistent with neoplastic disease. Multiple liver
masses consistent with widespread hepatic metastatic disease.
Ill-defined partly imaged mass along the porta hepatis suspicious
for a pancreatic malignancy. There also multiple subcentimeter lung
nodules consistent with metastatic disease. Recommend follow-up CT
of the abdomen and pelvis with contrast for further assessment
3. No acute findings in the chest.
4. Dilated ascending thoracic aorta to 4.7 cm. Ascending thoracic
aortic aneurysm. Recommend semi-annual imaging followup by CTA or
MRA and referral to cardiothoracic surgery if not already obtained.
This recommendation follows [EX]
ACCF/AHA/AATS/ACR/ASA/SCA/SIDDHI/SIDDHI/SIDDHI/SIDDHI Guidelines for the
Diagnosis and Management of Patients With Thoracic Aortic Disease.
Circulation. [EX]; 121: E266-e369. Aortic aneurysm NOS ([EX]-[EX])

Aortic aneurysm NOS ([EX]-[EX]).

## 2021-07-01 MED ORDER — IOHEXOL 300 MG/ML  SOLN
100.0000 mL | Freq: Once | INTRAMUSCULAR | Status: AC | PRN
  Administered 2021-07-01: 100 mL via INTRAVENOUS

## 2021-07-01 MED ORDER — IOHEXOL 350 MG/ML SOLN
50.0000 mL | Freq: Once | INTRAVENOUS | Status: AC | PRN
  Administered 2021-07-01: 50 mL via INTRAVENOUS

## 2021-07-01 NOTE — ED Notes (Signed)
Patient transported to CT 

## 2021-07-01 NOTE — ED Provider Notes (Signed)
Care of the patient assumed at the change of shift. R chest/RUQ pain, CTA chest concerning for metastatic lesions in liver, awaiting LFTs and CT abd/pel.  Physical Exam  BP (!) 141/96   Pulse 60   Temp 97.9 F (36.6 C) (Oral)   Resp 16   Ht 6\' 1"  (1.854 m)   Wt 86.2 kg   SpO2 100%   BMI 25.07 kg/m   Physical Exam  ED Course/Procedures   Clinical Course as of 07/01/21 1819  Fri Jul 01, 2021  1608 LFTs and Lipase are unremarkable. No signs of acute biliary obstruction.  [CS]  8366 CT images and results reviewed and discussed with patient/wife. Multiple liver lesions, likely mets of unclear primary. Recommend close outpatient follow up with PCP to begin further evaluation such as biopsy, PET, etc. Patient and wife understand, they have many questions which cannot be answered with available data today but understand that there will need to be a tissue diagnosis before further treatment decisions can be made.  [CS]    Clinical Course User Index [CS] Truddie Hidden, MD    Procedures  MDM         Truddie Hidden, MD 07/01/21 [phone]

## 2021-07-01 NOTE — ED Triage Notes (Signed)
Pt reports right sided chest pain for the past week, pain is worse with a deep breath. Taking antacids without relief. Pt a.o, resp e.u

## 2021-07-01 NOTE — ED Notes (Signed)
Pt back from CT

## 2021-07-01 NOTE — ED Notes (Signed)
Called lab they will add on labs

## 2021-07-01 NOTE — ED Provider Notes (Signed)
Emergency Medicine Provider Triage Evaluation Note  Billy [name] , a 80 y.o. male  was evaluated in triage.  Pt complains of right-sided chest pain x2-3 weeks.  He rates his pain as 1-2/10.  Patient has a history of stent placement in 2017.  Patient has tried over-the-counter Tums with no relief of his symptoms.  Patient denies shortness of breath, abdominal pain, nausea, vomiting, fever, chills.  Review of Systems  Positive: Chest pain Negative: Shortness of breath, abdominal pain  Physical Exam  BP (!) 151/80 (BP Location: Right Arm)   Pulse (!) 55   Temp 97.9 F (36.6 C) (Oral)   Resp 17   SpO2 100%  Gen:   Awake, no distress   Resp:  Normal effort  MSK:   Moves extremities without difficulty  Other:  Tenderness to palpation to right chest wall  Medical Decision Making  Medically screening exam initiated at 11:13 AM.  Appropriate orders placed.  Billy [name] was informed that the remainder of the evaluation will be completed by another provider, this initial triage assessment does not replace that evaluation, and the importance of remaining in the ED until their evaluation is complete.    Annaleise Burger A, PA-C 07/01/21 1242    Jeanell Sparrow, DO 07/02/21 [phone]

## 2021-07-01 NOTE — ED Provider Notes (Signed)
[facility] EMERGENCY DEPARTMENT Provider Note   CSN: [id number] Arrival date & time: 07/01/21  1041     History Chief Complaint  Patient presents with   Chest Pain    [place] is a 80 y.o. male.   Chest Pain Associated symptoms: no back pain and no shortness of breath   Patient presents with chest pain.  Right-sided chest.  Worse with deep breaths.  Has been there for around 2 weeks.  Has been pretty much constant.  Not worse with exertion.  States he has been able to do his same physical activity which is rather significant without worsening of the chest pain.  No limiting of his activity.  No fevers or chills.  No coughing.  States however does hurt more with a deep breath.  No swelling in his legs.  Previous MI history.  No abdominal pain.  No rash.  No trauma.    Past Medical History:  Diagnosis Date   Basal cell carcinoma 05/29/2012   left thigh-(EXC)   Basal cell carcinoma 10/23/2012   right side of nose (MOHS)   Basal cell carcinoma 12/12/2012   nod-irght cheek (MOHS)   Basal cell carcinoma 05/26/2015   nod-left calf (CX35FU)   Basal cell carcinoma 05/26/2015   nod-left nose (txpbx)   Basal cell carcinoma 06/08/2016   nod-above right brow (CX35FU)   Basal cell carcinoma 09/12/2016   left nose (MOHS)   Basal cell carcinoma 01/29/2019   nod-right tip of nose (MOHS)   Benign neoplasm of colon    Finger fracture    Hx of colonoscopy    Hyperlipidemia    Hypertension    Internal hemorrhoids without mention of complication    Myocardial infarction ([place])    09/01/2015   Plantar fascial fibromatosis    Prostatitis    Rib fracture    Squamous cell carcinoma of skin 07/13/2009   RIght lower outer leg   Squamous cell carcinoma of skin 02/17/2014   in situ-right temple (txpbx)   Squamous cell carcinoma of skin 01/24/2018   in situ-right chest (txpbx)   Squamous cell carcinoma of skin 01/24/2018   in situ0 right forehead (txpbx)   Squamous  cell carcinoma of skin     Patient Active Problem List   Diagnosis Date Noted   Vitamin D deficiency 08/03/2020   Hyperglycemia 07/25/2019   Bowel habit changes 08/13/2017   Preventative health care 07/20/2017   Medicare annual wellness visit, subsequent 07/11/2016   Cough 04/18/2016   Stye 03/13/2016   Elevated BP 03/13/2016   Plantar fasciitis of right foot 02/16/2016   Chronotropic incompetence 11/12/2015   DOE (dyspnea on exertion) 11/12/2015   Coronary artery disease involving native coronary artery of native heart without angina pectoris 09/22/2015   S/P drug eluting coronary stent placement 09/22/2015   Erectile dysfunction 07/27/2015   Former smoker 05/21/2014   Gout 07/24/2012   Abnormal finding on EKG 11/21/2010   Mixed hyperlipidemia 04/20/2008   History of skin cancer 04/20/2008   POLYP, COLON 12/23/2007   Internal hemorrhoids 12/23/2007    Past Surgical History:  Procedure Laterality Date   cardiac stint  2017   COLONOSCOPY  2016   POLYPECTOMY     TONSILLECTOMY     VARICOCELECTOMY     Left       Family History  Problem Relation Age of Onset   Heart disease Mother        ? CAD. died 54   Stroke  Father        at age 28, smoked cigar   Diabetes Neg Hx    Prostate cancer Neg Hx    Colon cancer Neg Hx    Stomach cancer Neg Hx    Rectal cancer Neg Hx    Colon polyps Neg Hx    Esophageal cancer Neg Hx     Social History   Tobacco Use   Smoking status: Former    Packs/day: 1.00    Years: 20.00    Pack years: 20.00    Types: Cigarettes    Quit date: 08/07/1978    Years since quitting: 42.9   Smokeless tobacco: Never  Vaping Use   Vaping Use: Never used  Substance Use Topics   Alcohol use: Yes    Alcohol/week: 25.0 standard drinks    Types: 10 Cans of beer, 15 Standard drinks or equivalent per week   Drug use: No    Home Medications Prior to Admission medications   Medication Sig Start Date End Date Taking? Authorizing Provider  aspirin  EC 81 MG tablet Take 81 mg by mouth daily.    [provider]  atorvastatin (LIPITOR) 80 MG tablet Take 1 tablet (80 mg total) by mouth daily at 6 PM. 01/27/21   Hilty, Nadean Corwin, MD  FLUZONE HIGH-DOSE QUADRIVALENT 0.7 ML SUSY  04/26/20   [provider]  lisinopril (ZESTRIL) 2.5 MG tablet Take 1 tablet (2.5 mg total) by mouth daily. 12/01/20   Biagio Borg, MD  nitroGLYCERIN (NITROSTAT) 0.4 MG SL tablet Place 1 tablet (0.4 mg total) under the tongue every 5 (five) minutes as needed for chest pain. Max 3 doses. 03/13/18   Hilty, Nadean Corwin, MD  Omega-3 Fatty Acids (FISH OIL) 500 MG CAPS 1 capsule    [provider]    Allergies    Ofloxacin  Review of Systems   Review of Systems  Constitutional:  Negative for appetite change.  HENT:  Negative for congestion.   Respiratory:  Negative for shortness of breath.   Cardiovascular:  Positive for chest pain.  Genitourinary:  Negative for flank pain.  Musculoskeletal:  Negative for back pain.  Skin:  Negative for rash.  Hematological:  Negative for adenopathy.  Psychiatric/Behavioral:  Negative for confusion.    Physical Exam Updated Vital Signs BP 128/83   Pulse (!) 49   Temp 97.9 F (36.6 C) (Oral)   Resp 13   Ht 6\' 1"  (1.854 m)   Wt 86.2 kg   SpO2 97%   BMI 25.07 kg/m   Physical Exam Vitals reviewed.  Constitutional:      Appearance: He is well-developed.  Cardiovascular:     Rate and Rhythm: Normal rate and regular rhythm.     Heart sounds: No murmur heard. Pulmonary:     Breath sounds: No wheezing, rhonchi or rales.  Chest:     Chest wall: No tenderness.  Abdominal:     Tenderness: There is no abdominal tenderness.  Musculoskeletal:     Cervical back: Neck supple.     Right lower leg: No edema.     Left lower leg: No edema.  Skin:    General: Skin is warm.     Capillary Refill: Capillary refill takes less than 2 seconds.  Neurological:     Mental Status: He is alert and oriented to person,  place, and time.    ED Results / Procedures / Treatments   Labs (all labs ordered are listed, but only  abnormal results are displayed) Labs Reviewed  BASIC METABOLIC PANEL - Abnormal; Notable for the following components:      Result Value   Glucose, Bld 118 (*)    All other components within normal limits  D-DIMER, QUANTITATIVE - Abnormal; Notable for the following components:   D-Dimer, Quant 2.38 (*)    All other components within normal limits  CBC  LIPASE, BLOOD  HEPATIC FUNCTION PANEL  TROPONIN I (HIGH SENSITIVITY)  TROPONIN I (HIGH SENSITIVITY)    EKG None  Radiology CT Angio Chest PE W and/or Wo Contrast  Result Date: 07/01/2021 CLINICAL DATA:  Positive D-dimer.  Chest pain. EXAM: CT ANGIOGRAPHY CHEST WITH CONTRAST TECHNIQUE: Multidetector CT imaging of the chest was performed using the standard protocol during bolus administration of intravenous contrast. Multiplanar CT image reconstructions and MIPs were obtained to evaluate the vascular anatomy. CONTRAST:  35mL OMNIPAQUE IOHEXOL 350 MG/ML SOLN COMPARISON:  Current chest radiograph. FINDINGS: Cardiovascular: Pulmonary arteries are well opacified. There is no evidence of a pulmonary embolism. Heart mildly enlarged. No pericardial effusion. Left coronary artery calcifications. Ascending thoracic aorta measures 4.7 cm in diameter. Mild aortic atherosclerosis. No convincing dissection. Mediastinum/Nodes: No enlarged mediastinal, hilar, or axillary lymph nodes. Thyroid gland, trachea, and esophagus demonstrate no significant findings. Lungs/Pleura: Multiple subcentimeter lung nodules, largest at the diaphragmatic base of the right middle lobe and subpleural left lower lobe, image 67, series 7, both 5 mm. No lung consolidation to suggest pneumonia. No evidence of pulmonary edema. Mild apical pleuroparenchymal scarring. No pleural effusion or pneumothorax. Upper Abdomen: Ill-defined and partly imaged mass along the inferior porta  hepatis, suspicious for a pancreatic neoplasm. Margins are ill-defined. There are multiple low-attenuation liver masses, scattered throughout the liver, largest in the anterior aspect of the left lobe lateral segment, bulging the overlying surface contour, 4 cm in greatest dimension. Prominent Peri celiac lymph nodes, largest on the left measuring 1.7 cm in short axis. Musculoskeletal: No acute fracture or acute finding. No osteoblastic or osteolytic lesions. No chest wall masses. Review of the MIP images confirms the above findings. IMPRESSION: 1. No evidence of a pulmonary embolism. 2. Findings consistent with neoplastic disease. Multiple liver masses consistent with widespread hepatic metastatic disease. Ill-defined partly imaged mass along the porta hepatis suspicious for a pancreatic malignancy. There also multiple subcentimeter lung nodules consistent with metastatic disease. Recommend follow-up CT of the abdomen and pelvis with contrast for further assessment 3. No acute findings in the chest. 4. Dilated ascending thoracic aorta to 4.7 cm. Ascending thoracic aortic aneurysm. Recommend semi-annual imaging followup by CTA or MRA and referral to cardiothoracic surgery if not already obtained. This recommendation follows 2010 ACCF/AHA/AATS/ACR/ASA/SCA/SCAI/SIR/STS/SVM Guidelines for the Diagnosis and Management of Patients With Thoracic Aortic Disease. Circulation. 2010; 121: N277-O242. Aortic aneurysm NOS (ICD10-I71.9) Aortic aneurysm NOS (ICD10-I71.9). Electronically Signed   By: Lajean Manes M.D.   On: 07/01/2021 14:18   DG Chest Port 1 View  Result Date: 07/01/2021 CLINICAL DATA:  80 year old male with chest pain. EXAM: PORTABLE CHEST - 1 VIEW COMPARISON:  04/18/2016 FINDINGS: The mediastinal contours are within normal limits. No cardiomegaly. Coronary artery stent is visualized. Atherosclerotic calcification of the aortic arch. The lungs are clear bilaterally without evidence of focal consolidation,  pleural effusion, or pneumothorax. No acute osseous abnormality. IMPRESSION: No acute cardiopulmonary process. Aortic Atherosclerosis (ICD10-I70.0). Electronically Signed   By: Ruthann Cancer M.D.   On: 07/01/2021 11:52    Procedures Procedures   Medications Ordered in ED Medications  iohexol (OMNIPAQUE)  350 MG/ML injection 50 mL (50 mLs Intravenous Contrast Given 07/01/21 1407)    ED Course  I have reviewed the triage vital signs and the nursing notes.  Pertinent labs & imaging results that were available during my care of the patient were reviewed by me and considered in my medical decision making (see chart for details).    MDM Rules/Calculators/A&P                          Patient with chest pain.  Right chest.  Somewhat pleuritic.  Doubt cardiac cause.  EKG reassuring.  Troponin negative.  However D-dimer is elevated.  CT scan done and unfortunate showed likely metastatic cancer to liver potentially from pancreas.  Will get CT scan of abdomen pelvis.  Has PCP with Dr. Jenny Reichmann.  Potentially could get outpatient work-up we will see how the lab work does.  Care turned over to Dr. Karle Starch  Final Clinical Impression(s) / ED Diagnoses Final diagnoses:  Nonspecific chest pain    Rx / DC Orders ED Discharge Orders     None        Davonna Belling, MD 07/01/21 [phone]

## 2021-07-04 ENCOUNTER — Telehealth: Payer: Self-pay | Admitting: Internal Medicine

## 2021-07-04 DIAGNOSIS — C772 Secondary and unspecified malignant neoplasm of intra-abdominal lymph nodes: Secondary | ICD-10-CM

## 2021-07-04 NOTE — Telephone Encounter (Signed)
Ok to see pt dec 1  I also have done an urgent referral for oncology to get this process started as well, hopefuly to hear soon

## 2021-07-04 NOTE — Telephone Encounter (Signed)
[facility] FYI 07/01/21...   Caller states he has been experiencing central chest/ right sided pressure for the past 2 weeks. States it feels like 'acute indigestion', has taken tums- is not helping. States is worse when taking a deep breath.   Advised to call EMS 911 now. Caller disagreed but decided to go to the ED.

## 2021-07-04 NOTE — Telephone Encounter (Signed)
Patient calling in  Says he went to ED Friday 11/25 & they diagnosed him w/ cancer & wanted him to fu w/ primary provider ASAP.Marland Kitchen scheduled him 1st available w/ Dr. Jenny Reichmann 12/01 @3 :40pm  Patient requested call back from nurse also.. says he wants nurse to speak w/ Dr. Jenny Reichmann about how he should proceed w/ this diagnoses moving forward  Please call [phone]

## 2021-07-05 ENCOUNTER — Other Ambulatory Visit: Payer: Medicare HMO

## 2021-07-05 ENCOUNTER — Other Ambulatory Visit: Payer: Self-pay | Admitting: Physician Assistant

## 2021-07-05 ENCOUNTER — Telehealth: Payer: Self-pay | Admitting: Oncology

## 2021-07-05 DIAGNOSIS — C799 Secondary malignant neoplasm of unspecified site: Secondary | ICD-10-CM

## 2021-07-05 NOTE — Telephone Encounter (Signed)
Spoke with patient and appt has already been scheduled with Oncology

## 2021-07-05 NOTE — Telephone Encounter (Signed)
[facility] Scheduling Phone Note  I contacted [facility] regarding a referral from Dr. Cathlean Cower s/p ED visit for CP for purpose of evaluation and work up of widespread metastatic disease on imaging.  Hansel Starling Ertel was aware of his referral and reason.  Information on reason for referral was not necessary.  Patient was informed about the [facility] and the intent being to complete a diagnostic/prognostic work-up for his suspicious findings.  He  is aware her appointment is with our Physician Assistant to identify a diagnostic plan of care and to arrange further oncologist or specialist care as indicated.  I confirmed with Hansel Starling Beumer he  has transportation to his [facility] appointment.  Patient is aware to bring a list of medications, as well as insurance cards.  Visitor policy reviewed with patient as well, including what to expect on arrival to the [facility].  Thank you for the referral, and we look forward to [place] and completing her diagnostic work-up and arranging her subsequent appointment with our oncologist team.  Diagnostic Clinic Specific Info:  Best contact/way to contact patient:  ** CHL updated to reflect?:  not applicable Is there a HC POA?:  No  Location of Diagnostic Clinic Appointment and Contact Info Provided to Patient: [facility] [place], [place] [address] [phone]   Clinical referral information: Patient presented to the ED on 11/25 for chest pain - findings of work up are copy/paste below.  Discharged home to f/u with PCP.  Referred to oncology by Dr. Cathlean Cower.   CT Angio Chest: IMPRESSION: 1. No evidence of a pulmonary embolism. 2. Findings consistent with neoplastic disease. Multiple liver masses consistent with widespread hepatic metastatic disease. Ill-defined partly imaged mass along the porta hepatis suspicious for a  pancreatic malignancy. There also multiple subcentimeter lung nodules consistent with metastatic disease. Recommend follow-up CT of the abdomen and pelvis with contrast for further assessment 3. No acute findings in the chest. 4. Dilated ascending thoracic aorta to 4.7 cm. Ascending thoracic aortic aneurysm. Recommend semi-annual imaging followup by CTA or MRA and referral to cardiothoracic surgery if not already obtained. This recommendation follows 2010 ACCF/AHA/AATS/ACR/ASA/SCA/SCAI/SIR/STS/SVM Guidelines for the Diagnosis and Management of Patients With Thoracic Aortic Disease. Circulation. 2010; 121: T342-A768. Aortic aneurysm NOS (ICD10-I71.9)   Aortic aneurysm NOS (ICD10-I71.9).  CT Abdomen/Pelvis:  IMPRESSION: 1. Numerous hypodense hepatic lesions with dominant lesion in the inferior right lobe measuring up to 5.9 cm. Findings worrisome for diffuse metastatic disease. Primary hepatic neoplasm not excluded in the inferior right lobe. 2. Pathologically enlarged periportal, portal caval, peripancreatic and aortocaval lymph nodes worrisome for metastatic disease. 3. Bilateral inguinal hernias. Left inguinal hernia contains colon. No bowel obstruction. 4.  Aortic Atherosclerosis (ICD10-I70.0).

## 2021-07-06 ENCOUNTER — Inpatient Hospital Stay: Payer: Medicare HMO

## 2021-07-06 ENCOUNTER — Inpatient Hospital Stay: Payer: Medicare HMO | Attending: Physician Assistant | Admitting: Physician Assistant

## 2021-07-06 ENCOUNTER — Encounter (HOSPITAL_COMMUNITY): Payer: Self-pay | Admitting: Radiology

## 2021-07-06 ENCOUNTER — Other Ambulatory Visit: Payer: Self-pay

## 2021-07-06 VITALS — BP 128/82 | HR 61 | Temp 97.7°F | Resp 17 | Ht 73.0 in | Wt 197.5 lb

## 2021-07-06 DIAGNOSIS — C787 Secondary malignant neoplasm of liver and intrahepatic bile duct: Secondary | ICD-10-CM | POA: Insufficient documentation

## 2021-07-06 DIAGNOSIS — Z87891 Personal history of nicotine dependence: Secondary | ICD-10-CM | POA: Diagnosis not present

## 2021-07-06 DIAGNOSIS — Z7982 Long term (current) use of aspirin: Secondary | ICD-10-CM | POA: Insufficient documentation

## 2021-07-06 DIAGNOSIS — R970 Elevated carcinoembryonic antigen [CEA]: Secondary | ICD-10-CM | POA: Insufficient documentation

## 2021-07-06 DIAGNOSIS — I252 Old myocardial infarction: Secondary | ICD-10-CM | POA: Insufficient documentation

## 2021-07-06 DIAGNOSIS — Z85828 Personal history of other malignant neoplasm of skin: Secondary | ICD-10-CM | POA: Insufficient documentation

## 2021-07-06 DIAGNOSIS — C799 Secondary malignant neoplasm of unspecified site: Secondary | ICD-10-CM

## 2021-07-06 DIAGNOSIS — K769 Liver disease, unspecified: Secondary | ICD-10-CM | POA: Diagnosis not present

## 2021-07-06 DIAGNOSIS — R591 Generalized enlarged lymph nodes: Secondary | ICD-10-CM | POA: Insufficient documentation

## 2021-07-06 DIAGNOSIS — R918 Other nonspecific abnormal finding of lung field: Secondary | ICD-10-CM | POA: Insufficient documentation

## 2021-07-06 DIAGNOSIS — R935 Abnormal findings on diagnostic imaging of other abdominal regions, including retroperitoneum: Secondary | ICD-10-CM | POA: Insufficient documentation

## 2021-07-06 DIAGNOSIS — I7121 Aneurysm of the ascending aorta, without rupture: Secondary | ICD-10-CM | POA: Diagnosis not present

## 2021-07-06 DIAGNOSIS — C801 Malignant (primary) neoplasm, unspecified: Secondary | ICD-10-CM | POA: Diagnosis not present

## 2021-07-06 DIAGNOSIS — R971 Elevated cancer antigen 125 [CA 125]: Secondary | ICD-10-CM | POA: Diagnosis not present

## 2021-07-06 DIAGNOSIS — R160 Hepatomegaly, not elsewhere classified: Secondary | ICD-10-CM | POA: Diagnosis not present

## 2021-07-06 DIAGNOSIS — E785 Hyperlipidemia, unspecified: Secondary | ICD-10-CM | POA: Diagnosis not present

## 2021-07-06 DIAGNOSIS — Z79899 Other long term (current) drug therapy: Secondary | ICD-10-CM | POA: Insufficient documentation

## 2021-07-06 DIAGNOSIS — I10 Essential (primary) hypertension: Secondary | ICD-10-CM | POA: Insufficient documentation

## 2021-07-06 LAB — CBC WITH DIFFERENTIAL (CANCER CENTER ONLY)
Abs Immature Granulocytes: 0.02 10*3/uL (ref 0.00–0.07)
Basophils Absolute: 0.1 10*3/uL (ref 0.0–0.1)
Basophils Relative: 1 %
Eosinophils Absolute: 0.1 10*3/uL (ref 0.0–0.5)
Eosinophils Relative: 1 %
HCT: 40.6 % (ref 39.0–52.0)
Hemoglobin: 13.5 g/dL (ref 13.0–17.0)
Immature Granulocytes: 0 %
Lymphocytes Relative: 17 %
Lymphs Abs: 1.7 10*3/uL (ref 0.7–4.0)
MCH: 31.5 pg (ref 26.0–34.0)
MCHC: 33.3 g/dL (ref 30.0–36.0)
MCV: 94.6 fL (ref 80.0–100.0)
Monocytes Absolute: 1.1 10*3/uL — ABNORMAL HIGH (ref 0.1–1.0)
Monocytes Relative: 11 %
Neutro Abs: 7.1 10*3/uL (ref 1.7–7.7)
Neutrophils Relative %: 70 %
Platelet Count: 249 10*3/uL (ref 150–400)
RBC: 4.29 MIL/uL (ref 4.22–5.81)
RDW: 12.5 % (ref 11.5–15.5)
WBC Count: 10.1 10*3/uL (ref 4.0–10.5)
nRBC: 0 % (ref 0.0–0.2)

## 2021-07-06 LAB — CMP (CANCER CENTER ONLY)
ALT: 52 U/L — ABNORMAL HIGH (ref 0–44)
AST: 83 U/L — ABNORMAL HIGH (ref 15–41)
Albumin: 3.6 g/dL (ref 3.5–5.0)
Alkaline Phosphatase: 144 U/L — ABNORMAL HIGH (ref 38–126)
Anion gap: 9 (ref 5–15)
BUN: 20 mg/dL (ref 8–23)
CO2: 24 mmol/L (ref 22–32)
Calcium: 8.9 mg/dL (ref 8.9–10.3)
Chloride: 105 mmol/L (ref 98–111)
Creatinine: 0.94 mg/dL (ref 0.61–1.24)
GFR, Estimated: >60 mL/min (ref >60)
Glucose, Bld: 98 mg/dL (ref 70–99)
Potassium: 4.7 mmol/L (ref 3.5–5.1)
Sodium: 138 mmol/L (ref 135–145)
Total Bilirubin: 0.8 mg/dL (ref 0.3–1.2)
Total Protein: 6.9 g/dL (ref 6.5–8.1)

## 2021-07-06 LAB — CEA (IN HOUSE-CHCC): CEA (CHCC-In House): 203.05 ng/mL — ABNORMAL HIGH (ref 0.00–5.00)

## 2021-07-06 LAB — LACTATE DEHYDROGENASE: LDH: 1021 U/L — ABNORMAL HIGH (ref 98–192)

## 2021-07-06 NOTE — Progress Notes (Signed)
Patient Name  Billy [name], Billy [name] Legal Sex  Male DOB  1940-10-29 SSN  [id number] Address  [place] [address] Phone  [phone] (Home) *Preferred*  [phone] (Mobile)    RE: US BIOPSY (LIVER) Received: Today Arne Cleveland, MD  [place], Bolden Hagerman D Ok   [place] core liver lesion   DDH        Previous Messages   ----- Message -----  From: Garth Bigness D  Sent: 07/06/2021   2:36 PM EST  To: [facility] Procedure Requests  Subject: US BIOPSY (LIVER)                               Procedure:  US BIOPSY (LIVER)   Reason:  Metastatic malignant neoplasm, unspecified site, multiple liver lesions concerning for malignancy   History:  CT in computer   Provider:  Dede Query T   Provider Contact:  [phone]

## 2021-07-07 ENCOUNTER — Encounter: Payer: Self-pay | Admitting: Internal Medicine

## 2021-07-07 ENCOUNTER — Ambulatory Visit (INDEPENDENT_AMBULATORY_CARE_PROVIDER_SITE_OTHER): Payer: Medicare HMO | Admitting: Internal Medicine

## 2021-07-07 VITALS — BP 128/64 | HR 41 | Temp 97.8°F | Ht 73.0 in | Wt 196.0 lb

## 2021-07-07 DIAGNOSIS — C799 Secondary malignant neoplasm of unspecified site: Secondary | ICD-10-CM | POA: Diagnosis not present

## 2021-07-07 DIAGNOSIS — R69 Illness, unspecified: Secondary | ICD-10-CM | POA: Diagnosis not present

## 2021-07-07 DIAGNOSIS — F419 Anxiety disorder, unspecified: Secondary | ICD-10-CM

## 2021-07-07 DIAGNOSIS — R739 Hyperglycemia, unspecified: Secondary | ICD-10-CM | POA: Diagnosis not present

## 2021-07-07 DIAGNOSIS — E559 Vitamin D deficiency, unspecified: Secondary | ICD-10-CM

## 2021-07-07 DIAGNOSIS — R160 Hepatomegaly, not elsewhere classified: Secondary | ICD-10-CM | POA: Insufficient documentation

## 2021-07-07 LAB — PROSTATE-SPECIFIC AG, SERUM (LABCORP): Prostate Specific Ag, Serum: 2.4 ng/mL (ref 0.0–4.0)

## 2021-07-07 LAB — CANCER ANTIGEN 19-9: CA 19-9: 53813 U/mL — ABNORMAL HIGH (ref 0–35)

## 2021-07-07 LAB — AFP TUMOR MARKER: AFP, Serum, Tumor Marker: 3.9 ng/mL (ref 0.0–8.4)

## 2021-07-07 MED ORDER — ALPRAZOLAM 0.5 MG PO TABS: 0.5000 mg | ORAL_TABLET | Freq: Two times a day (BID) | ORAL | 0 refills | Status: AC | PRN

## 2021-07-07 NOTE — Progress Notes (Signed)
[place] [phone]   [phone]  INITIAL CONSULTATION:  Patient Care Team: Biagio Borg, MD as PCP - General (Internal Medicine) Debara Pickett Nadean Corwin, MD as PCP - Cardiology (Cardiology) Lavonna Monarch, MD (Dermatology) Warren Danes, PA-C as Physician Assistant (Dermatology)  CHIEF COMPLAINTS/PURPOSE OF CONSULTATION:  Multifocal liver lesions and intraabdominal lymphadenopathy  ONCOLOGIC HISTORY: 1) 07/01/2021: Presented to emergency room due to chest pain.  CT scan revealed numerous hypodense hepatic lesions with dominant lesion in the inferior right lobe measuring up to 5.9 cm.  Pathologically enlarged periportal, portocaval, peripancreatic and aortocaval lymph nodes.   2) 07/06/2021: Established care at [facility].   HISTORY OF PRESENTING ILLNESS:  Billy [name] is a 80 y.o. male with medical history significant for basal carcinoma of multiple locations, myocardial infarction, hyperlipidemia and hypertension.  Patient is accompanied by his wife for this visit.  On exam today, [name] reports that his energy levels are unchanged.  He continues to stay active and recently lawnmowing over 7 miles.  He has a good appetite and denies any recent weight loss.  He reports having some intermittent episodes of indigestion and right sided abdominal discomfort that has been present for the last 3 to 4 weeks.  He denies any bowel habit changes including diarrhea or constipation.  He denies any easy bruising or signs of bleeding.  Patient denies any fevers, chills, night sweats, shortness of breath, chest pain or cough.  He has no other complaints.  Rest of the 10 point ROS is below.  MEDICAL HISTORY:  Past Medical History:  Diagnosis Date   Basal cell carcinoma 05/29/2012   left thigh-(EXC)   Basal cell carcinoma 10/23/2012   right side of nose (MOHS)   Basal cell carcinoma 12/12/2012   nod-irght cheek  (MOHS)   Basal cell carcinoma 05/26/2015   nod-left calf (CX35FU)   Basal cell carcinoma 05/26/2015   nod-left nose (txpbx)   Basal cell carcinoma 06/08/2016   nod-above right brow (CX35FU)   Basal cell carcinoma 09/12/2016   left nose (MOHS)   Basal cell carcinoma 01/29/2019   nod-right tip of nose (MOHS)   Benign neoplasm of colon    Finger fracture    Hx of colonoscopy    Hyperlipidemia    Hypertension    Internal hemorrhoids without mention of complication    Myocardial infarction ([place])    09/01/2015   Plantar fascial fibromatosis    Prostatitis    Rib fracture    Squamous cell carcinoma of skin 07/13/2009   RIght lower outer leg   Squamous cell carcinoma of skin 02/17/2014   in situ-right temple (txpbx)   Squamous cell carcinoma of skin 01/24/2018   in situ-right chest (txpbx)   Squamous cell carcinoma of skin 01/24/2018   in situ0 right forehead (txpbx)   Squamous cell carcinoma of skin     SURGICAL HISTORY: Past Surgical History:  Procedure Laterality Date   cardiac stint  2017   COLONOSCOPY  2016   POLYPECTOMY     TONSILLECTOMY     VARICOCELECTOMY     Left    SOCIAL HISTORY: Social History   Socioeconomic History   Marital status: Married    Spouse name: Not on file   Number of children: 2   Years of education: Not on file   Highest education level: Not on file  Occupational History   Occupation: Retired  Tobacco Use   Smoking status: Former  Packs/day: 1.00    Years: 20.00    Pack years: 20.00    Types: Cigarettes    Quit date: 08/07/1978    Years since quitting: 42.9   Smokeless tobacco: Never  Vaping Use   Vaping Use: Never used  Substance and Sexual Activity   Alcohol use: Yes    Alcohol/week: 25.0 standard drinks    Types: 10 Cans of beer, 15 Standard drinks or equivalent per week   Drug use: No   Sexual activity: Not on file  Other Topics Concern   Not on file  Social History Narrative   [employer], [employer]  Scientist, clinical (histocompatibility and immunogenetics); Estate manager/land agent license   [employer] - 6 years      Married - '69   1 son - '78, 1 daughter '74 2 grandchildren      Retired - stays busy, 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 CAD. died 70   Stroke Father        at age 24, smoked cigar   Diabetes Neg Hx    Prostate cancer Neg Hx    Colon cancer Neg Hx    Stomach cancer Neg Hx    Rectal cancer Neg Hx    Colon polyps Neg Hx    Esophageal cancer Neg Hx     ALLERGIES:  is allergic to ofloxacin.  MEDICATIONS:  Current Outpatient Medications  Medication Sig Dispense Refill   aspirin EC 81 MG tablet Take 81 mg by mouth daily.     atorvastatin (LIPITOR) 80 MG tablet Take 1 tablet (80 mg total) by mouth daily at 6 PM. 90 tablet 3   Cholecalciferol (VITAMIN D3 PO) Take 1 tablet by mouth daily.     FLUZONE HIGH-DOSE QUADRIVALENT 0.7 ML SUSY      lisinopril (ZESTRIL) 2.5 MG tablet Take 1 tablet (2.5 mg total) by mouth daily. 90 tablet 3   nitroGLYCERIN (NITROSTAT) 0.4 MG SL tablet Place 1 tablet (0.4 mg total) under the tongue every 5 (five) minutes as needed for chest pain. Max 3 doses. 25 tablet 3   Omega-3 Fatty Acids (FISH OIL) 500 MG CAPS Take 500 mg by mouth daily.     No current facility-administered medications for this visit.    REVIEW OF SYSTEMS:   Constitutional: ( - ) fevers, ( - )  chills , ( - ) night sweats Eyes: ( - ) blurriness of vision, ( - ) double vision, ( - ) watery eyes Ears, nose, mouth, throat, and face: ( - ) mucositis, ( - ) sore  throat Respiratory: ( - ) cough, ( - ) dyspnea, ( - ) wheezes Cardiovascular: ( - ) palpitation, ( - ) chest discomfort, ( - ) lower extremity swelling Gastrointestinal:  ( - ) nausea, ( - ) heartburn, ( - ) change in bowel habits Skin: ( - ) abnormal skin rashes Lymphatics: ( - ) new lymphadenopathy, ( - )  easy bruising Neurological: ( - ) numbness, ( - ) tingling, ( - ) new weaknesses Behavioral/Psych: ( - ) mood change, ( - ) new changes  All other systems were reviewed with the patient and are negative.  PHYSICAL EXAMINATION: ECOG PERFORMANCE STATUS: 1 - Symptomatic but completely ambulatory  Vitals:   07/06/21 1302  BP: 128/82  Pulse: 61  Resp: 17  Temp: 97.7 F (36.5 C)  SpO2: 98%   Filed Weights   07/06/21 1302  Weight: 197 lb 8 oz (89.6 kg)    GENERAL: well appearing male in NAD  SKIN: skin color, texture, turgor are normal, no rashes or significant lesions EYES: conjunctiva are pink and non-injected, sclera clear OROPHARYNX: no exudate, no erythema; lips, buccal mucosa, and tongue normal  NECK: supple, non-tender LYMPH:  no palpable lymphadenopathy in the cervical or supraclavicular lymph nodes.  LUNGS: clear to auscultation and percussion with normal breathing effort HEART: regular rate & rhythm and no murmurs and no lower extremity edema ABDOMEN: soft, non-tender, non-distended, normal bowel sounds Musculoskeletal: no cyanosis of digits and no clubbing  PSYCH: alert & oriented x 3, fluent speech NEURO: no focal motor/sensory deficits  LABORATORY DATA:  I have reviewed the data as listed CBC Latest Ref Rng & Units 07/06/2021 07/01/2021 07/28/2020  WBC 4.0 - 10.5 K/uL 10.1 9.8 7.7  Hemoglobin 13.0 - 17.0 g/dL 13.5 13.5 14.6  Hematocrit 39.0 - 52.0 % 40.6 41.0 43.5  Platelets 150 - 400 K/uL 249 260 265.0    CMP Latest Ref Rng & Units 07/06/2021 07/01/2021 07/28/2020  Glucose 70 - 99 mg/dL 98 118(H) 98  BUN 8 - 23 mg/dL 20 16 18   Creatinine 0.61 - 1.24 mg/dL  0.94 0.88 0.95  Sodium 135 - 145 mmol/L 138 137 138  Potassium 3.5 - 5.1 mmol/L 4.7 4.7 4.5  Chloride 98 - 111 mmol/L 105 106 104  CO2 22 - 32 mmol/L 24 25 28   Calcium 8.9 - 10.3 mg/dL 8.9 8.9 9.0  Total Protein 6.5 - 8.1 g/dL 6.9 6.0(L) 6.9  Total Bilirubin 0.3 - 1.2 mg/dL 0.8 1.2 1.1  Alkaline Phos 38 - 126 U/L 144(H) 105 57  AST 15 - 41 U/L 83(H) 64(H) 20  ALT 0 - 44 U/L 52(H) 29 21     RADIOGRAPHIC STUDIES: I have personally reviewed the radiological images as listed and agreed with the findings in the report. CT Angio Chest PE W and/or Wo Contrast  Result Date: 07/01/2021 CLINICAL DATA:  Positive D-dimer.  Chest pain. EXAM: CT ANGIOGRAPHY CHEST WITH CONTRAST TECHNIQUE: Multidetector CT imaging of the chest was performed using the standard protocol during bolus administration of intravenous contrast. Multiplanar CT image reconstructions and MIPs were obtained to evaluate the vascular anatomy. CONTRAST:  51mL OMNIPAQUE IOHEXOL 350 MG/ML SOLN COMPARISON:  Current chest radiograph. FINDINGS: Cardiovascular: Pulmonary arteries are well opacified. There is no evidence of a pulmonary embolism. Heart mildly enlarged. No pericardial effusion. Left coronary artery calcifications. Ascending thoracic aorta measures 4.7 cm in diameter. Mild aortic atherosclerosis. No convincing dissection. Mediastinum/Nodes: No enlarged mediastinal, hilar, or axillary lymph nodes. Thyroid gland, trachea, and esophagus demonstrate no significant findings. Lungs/Pleura: Multiple subcentimeter lung nodules, largest at the diaphragmatic base of the right middle lobe and subpleural left lower lobe, image 67, series 7, both 5 mm. No lung consolidation to suggest pneumonia. No evidence of pulmonary edema. Mild apical pleuroparenchymal scarring. No pleural effusion or pneumothorax. Upper Abdomen: Ill-defined and partly imaged mass along the inferior porta hepatis,  suspicious for a pancreatic neoplasm. Margins are ill-defined.  There are multiple low-attenuation liver masses, scattered throughout the liver, largest in the anterior aspect of the left lobe lateral segment, bulging the overlying surface contour, 4 cm in greatest dimension. Prominent Peri celiac lymph nodes, largest on the left measuring 1.7 cm in short axis. Musculoskeletal: No acute fracture or acute finding. No osteoblastic or osteolytic lesions. No chest wall masses. Review of the MIP images confirms the above findings. IMPRESSION: 1. No evidence of a pulmonary embolism. 2. Findings consistent with neoplastic disease. Multiple liver masses consistent with widespread hepatic metastatic disease. Ill-defined partly imaged mass along the porta hepatis suspicious for a pancreatic malignancy. There also multiple subcentimeter lung nodules consistent with metastatic disease. Recommend follow-up CT of the abdomen and pelvis with contrast for further assessment 3. No acute findings in the chest. 4. Dilated ascending thoracic aorta to 4.7 cm. Ascending thoracic aortic aneurysm. Recommend semi-annual imaging followup by CTA or MRA and referral to cardiothoracic surgery if not already obtained. This recommendation follows 2010 ACCF/AHA/AATS/ACR/ASA/SCA/SCAI/SIR/STS/SVM Guidelines for the Diagnosis and Management of Patients With Thoracic Aortic Disease. Circulation. 2010; 121: V564-P329. Aortic aneurysm NOS (ICD10-I71.9) Aortic aneurysm NOS (ICD10-I71.9). Electronically Signed   By: Lajean Manes M.D.   On: 07/01/2021 14:18   CT ABDOMEN PELVIS W CONTRAST  Result Date: 07/01/2021 CLINICAL DATA:  Abdominal pain, acute. EXAM: CT ABDOMEN AND PELVIS WITH CONTRAST TECHNIQUE: Multidetector CT imaging of the abdomen and pelvis was performed using the standard protocol following bolus administration of intravenous contrast. CONTRAST:  142mL OMNIPAQUE IOHEXOL 300 MG/ML  SOLN COMPARISON:  CT angiogram chest 07/01/2021. FINDINGS: Lower chest: No acute abnormality. Hepatobiliary: There are  numerous hypodense liver lesions. The largest lesion is seen in the inferior right lobe measuring 5.9 x 3.0 x 4.7 cm. The gallbladder bile ducts are within normal limits. Pancreas: Unremarkable. No pancreatic ductal dilatation or surrounding inflammatory changes. Spleen: Normal in size without focal abnormality. Adrenals/Urinary Tract: Adrenal glands are unremarkable. Kidneys are normal, without renal calculi, focal lesion, or hydronephrosis. There are hypodensities in the left kidney which are too small to characterize, most likely cysts. Otherwise, the kidneys, adrenal glands and bladder are within normal limits. Stomach/Bowel: Stomach is within normal limits. No evidence of bowel wall thickening, distention, or inflammatory changes. The appendix is not seen. Vascular/Lymphatic: Abdominal aorta is normal in size. There are atherosclerotic calcifications of the aorta. There are enlarged periportal lymph nodes, low-density, measuring up to 2.8 x 2.2 cm. There is an enlarged portacaval lymph node measuring 3.6 x 1.3 cm. There is an enlarged peripancreatic lymph node measuring 2.0 x 1.5 cm image 3/17. There are enlarged aortocaval lymph nodes measuring up to 2.7 x 1.8 cm. Reproductive: Prostate is unremarkable. Other: There is a small fat containing right inguinal hernia. There is a moderate-sized left inguinal hernia containing nondilated colon. There is no ascites. Musculoskeletal: No acute or significant osseous findings. IMPRESSION: 1. Numerous hypodense hepatic lesions with dominant lesion in the inferior right lobe measuring up to 5.9 cm. Findings worrisome for diffuse metastatic disease. Primary hepatic neoplasm not excluded in the inferior right lobe. 2. Pathologically enlarged periportal, portal caval, peripancreatic and aortocaval lymph nodes worrisome for metastatic disease. 3. Bilateral inguinal hernias. Left inguinal hernia contains colon. No bowel obstruction. 4.  Aortic Atherosclerosis (ICD10-I70.0).  Electronically Signed   By: Ronney Asters M.D.   On: 07/01/2021 17:39   DG Chest Port 1 View  Result Date: 07/01/2021 CLINICAL DATA:  80 year old male with chest  pain. EXAM: PORTABLE CHEST - 1 VIEW COMPARISON:  04/18/2016 FINDINGS: The mediastinal contours are within normal limits. No cardiomegaly. Coronary artery stent is visualized. Atherosclerotic calcification of the aortic arch. The lungs are clear bilaterally without evidence of focal consolidation, pleural effusion, or pneumothorax. No acute osseous abnormality. IMPRESSION: No acute cardiopulmonary process. Aortic Atherosclerosis (ICD10-I70.0). Electronically Signed   By: Ruthann Cancer M.D.   On: 07/01/2021 11:52    ASSESSMENT & PLAN [place] is a 80 y.o. who presents to the clinic for abnormal CT findings concerning for malignancy.  I reviewed the CT scan in detail with the patient and showed him the areas in the liver and abdomen concerning for malignancy.  Patient will proceed with diagnostic work-up to include serologic evaluation today and an ultrasound-guided liver biopsy scheduled on 07/12/2021.  Since there is multifocal lesion seen on CT imaging, patient will likely need chemotherapy as the mainstay treatment.  We will be proactive and order a Port-A-Cath in preparation for treatment.  Patient will follow-up in clinic once diagnosis has been confirmed to discuss staging, treatment options and prognosis.  #Multiple liver lesions and intraabdominal lymphadenopathy: --CT CAP from 07/01/2021 shows numerous hepatic lesions and pathologic enlarged periportal, portocaval, peripancreatic and aortocaval lymph nodes. --Labs today to check CBC, CMP, LDH, CEA, CA 19-9, AFP and PSA levels --Need ultrasound-guided liver biopsy to confirm diagnosis.  Scheduled for 07/12/2021. --Plan to order Port-A-Cath due to anticipated IV chemotherapy treatment --RTC on 07/21/2021 to see Dr. Lorenso Courier to finalize treatment recommendations.   Orders Placed  This Encounter  Procedures   [place] CORE BIOPSY (LIVER)    Standing Status:   Future    Standing Expiration Date:   07/06/2022    Order Specific Question:   Lab orders requested (DO NOT place separate lab orders, these will be automatically ordered during procedure specimen collection):    Answer:   Surgical Pathology    Order Specific Question:   Reason for Exam (SYMPTOM  OR DIAGNOSIS REQUIRED)    Answer:   multiple liver lesions concerning for malignancy    Order Specific Question:   Preferred location?    Answer:   [facility]   Lactate dehydrogenase (LDH)    Standing Status:   Future    Number of Occurrences:   1    Standing Expiration Date:   07/06/2022    All questions were answered. The patient knows to call the clinic with any problems, questions or concerns.  I have spent a total of 60 minutes minutes of face-to-face and non-face-to-face time, preparing to see the patient, obtaining and/or reviewing separately obtained history, performing a medically appropriate examination, counseling and educating the patient, ordering tests/procedures, documenting clinical information in the electronic health record, and care coordination.   Dede Query, PA-C Department of Hematology/Oncology [place] [facility] Phone: [phone]  Patient was seen with Dr. Lorenso Courier.   I have read the above note and personally examined the patient. I agree with the assessment and plan as noted above.  Briefly Mr. Schoch is an 80 year old male who presents for evaluation of intra-abdominal lesions and lymphadenopathy.  Findings are concerning for metastatic disease or possible lymphoma.  We will pursue biopsy and port placement for presumed therapy.  We will also order some tumor markers in order to help direct [place] towards a possible primary.  The patient voices understanding of this plan moving forward.  We will schedule to see him back after biopsy has been  completed and  tissue has undergone pathology review.    Ledell Peoples, MD Department of Hematology/Oncology [place] [facility] Phone: [phone] Pager: [phone] Email: [email or web address]

## 2021-07-07 NOTE — Progress Notes (Signed)
Patient ID: Billy [name], male   DOB: 1941/02/22, 80 y.o.   MRN: [id number]        Chief Complaint: follow up recent metastatic malignancy with wife       HPI:  Billy [name] is a 80 y.o. male here to f/u recent ED visit with CT abd/pelvis c/w metastatic melanoma, has been referred to oncology, seen earlier today, labs ordered and biopsy ordered.  Pt with many questions today, has fortunately little pain involved, in fact wife and pt very surprised so little pain.  Denies worsening depressive symptoms, suicidal ideation, or panic; but has much anixety over the near future.  Pt denies chest pain, increased sob or doe, wheezing, orthopnea, PND, increased LE swelling, palpitations, dizziness or syncope.   Pt denies fever, wt loss, night sweats, loss of appetite, or other constitutional symptoms       Wt Readings from Last 3 Encounters:  07/07/21 196 lb (88.9 kg)  07/06/21 197 lb 8 oz (89.6 kg)  07/01/21 190 lb (86.2 kg)   BP Readings from Last 3 Encounters:  07/07/21 128/64  07/06/21 128/82  07/01/21 (!) 144/81         Past Medical History:  Diagnosis Date   Basal cell carcinoma 05/29/2012   left thigh-(EXC)   Basal cell carcinoma 10/23/2012   right side of nose (MOHS)   Basal cell carcinoma 12/12/2012   nod-irght cheek (MOHS)   Basal cell carcinoma 05/26/2015   nod-left calf (CX35FU)   Basal cell carcinoma 05/26/2015   nod-left nose (txpbx)   Basal cell carcinoma 06/08/2016   nod-above right brow (CX35FU)   Basal cell carcinoma 09/12/2016   left nose (MOHS)   Basal cell carcinoma 01/29/2019   nod-right tip of nose (MOHS)   Benign neoplasm of colon    Finger fracture    Hx of colonoscopy    Hyperlipidemia    Hypertension    Internal hemorrhoids without mention of complication    Myocardial infarction ([place])    09/01/2015   Plantar fascial fibromatosis    Prostatitis    Rib fracture    Squamous cell carcinoma of skin 07/13/2009   RIght lower outer leg   Squamous cell  carcinoma of skin 02/17/2014   in situ-right temple (txpbx)   Squamous cell carcinoma of skin 01/24/2018   in situ-right chest (txpbx)   Squamous cell carcinoma of skin 01/24/2018   in situ0 right forehead (txpbx)   Squamous cell carcinoma of skin    Past Surgical History:  Procedure Laterality Date   cardiac stint  2017   COLONOSCOPY  2016   POLYPECTOMY     TONSILLECTOMY     VARICOCELECTOMY     Left    reports that he quit smoking about 42 years ago. His smoking use included cigarettes. He has a 20.00 pack-year smoking history. He has never used smokeless tobacco. He reports current alcohol use of about 25.0 standard drinks per week. He reports that he does not use drugs. family history includes Heart disease in his mother; Stroke in his father. Allergies  Allergen Reactions   Ofloxacin     REACTION: rash   Current Outpatient Medications on File Prior to Visit  Medication Sig Dispense Refill   aspirin EC 81 MG tablet Take 81 mg by mouth daily.     atorvastatin (LIPITOR) 80 MG tablet Take 1 tablet (80 mg total) by mouth daily at 6 PM. 90 tablet 3   Cholecalciferol (VITAMIN D3 PO) Take 1  tablet by mouth daily.     lisinopril (ZESTRIL) 2.5 MG tablet Take 1 tablet (2.5 mg total) by mouth daily. 90 tablet 3   nitroGLYCERIN (NITROSTAT) 0.4 MG SL tablet Place 1 tablet (0.4 mg total) under the tongue every 5 (five) minutes as needed for chest pain. Max 3 doses. 25 tablet 3   Omega-3 Fatty Acids (FISH OIL) 500 MG CAPS Take 500 mg by mouth daily.     No current facility-administered medications on file prior to visit.        ROS:  All others reviewed and negative.  Objective        PE:  BP 128/64 (BP Location: Right Arm, Patient Position: Sitting, Cuff Size: Large)   Pulse (!) 41   Temp 97.8 F (36.6 C) (Oral)   Ht 6\' 1"  (1.854 m)   Wt 196 lb (88.9 kg)   SpO2 95%   BMI 25.86 kg/m                 Constitutional: Pt appears in NAD               HENT: Head: NCAT.                 Right Ear: External ear normal.                 Left Ear: External ear normal.                Eyes: . Pupils are equal, round, and reactive to light. Conjunctivae and EOM are normal               Nose: without d/c or deformity               Neck: Neck supple. Gross normal ROM               Cardiovascular: Normal rate and regular rhythm.                 Pulmonary/Chest: Effort normal and breath sounds without rales or wheezing.                Abd:  Soft, NT, ND, + BS, no organomegaly               Neurological: Pt is alert. At baseline orientation, motor grossly intact               Skin: Skin is warm. No rashes, no other new lesions, LE edema - none               Psychiatric: Pt behavior is normal without agitation ; 2+ anxiety  Micro: none  Cardiac tracings I have personally interpreted today:  none  Pertinent Radiological findings (summarize): none   Lab Results  Component Value Date   WBC 10.1 07/06/2021   HGB 13.5 07/06/2021   HCT 40.6 07/06/2021   PLT 249 07/06/2021   GLUCOSE 98 07/06/2021   CHOL 135 07/28/2020   TRIG 88.0 07/28/2020   HDL 50.50 07/28/2020   LDLDIRECT 144.0 11/21/2010   LDLCALC 67 07/28/2020   ALT 52 (H) 07/06/2021   AST 83 (H) 07/06/2021   NA 138 07/06/2021   K 4.7 07/06/2021   CL 105 07/06/2021   CREATININE 0.94 07/06/2021   BUN 20 07/06/2021   CO2 24 07/06/2021   TSH 2.82 07/28/2020   PSA 2.75 07/28/2020   HGBA1C 5.7 07/28/2020   Assessment/Plan:  Billy [name] is a 80 y.o. White  or Caucasian [1] male with  has a past medical history of Basal cell carcinoma (05/29/2012), Basal cell carcinoma (10/23/2012), Basal cell carcinoma (12/12/2012), Basal cell carcinoma (05/26/2015), Basal cell carcinoma (05/26/2015), Basal cell carcinoma (06/08/2016), Basal cell carcinoma (09/12/2016), Basal cell carcinoma (01/29/2019), Benign neoplasm of colon, Finger fracture, colonoscopy, Hyperlipidemia, Hypertension, Internal hemorrhoids without mention of complication,  Myocardial infarction [facility]), Plantar fascial fibromatosis, Prostatitis, Rib fracture, Squamous cell carcinoma of skin (07/13/2009), Squamous cell carcinoma of skin (02/17/2014), Squamous cell carcinoma of skin (01/24/2018), Squamous cell carcinoma of skin (01/24/2018), and Squamous cell carcinoma of skin.  Multiple lesions of metastatic malignancy (HCC) D/w pt, no specific advice on diagnosis, treatment and prognosis can be given until biopsy and f/u with oncology  Hyperglycemia Lab Results  Component Value Date   HGBA1C 5.7 07/28/2020   Stable, pt to continue current medical treatment  - diet   Anxiety With situational worsening, for xanax prn,  to f/u any worsening symptoms or concerns  Vitamin D deficiency Last vitamin D Lab Results  Component Value Date   VD25OH 26.48 (L) 07/25/2019  low, tp start oral replacement  Followup: Return in about 3 months (around 10/05/2021).  Cathlean Cower, MD 07/07/2021 10:43 PM [facility]

## 2021-07-07 NOTE — Assessment & Plan Note (Signed)
Last vitamin D Lab Results  Component Value Date   VD25OH 26.48 (L) 07/25/2019  low, tp start oral replacement

## 2021-07-07 NOTE — Patient Instructions (Signed)
Please take all new medication as prescribed - the xanax as needed  Please continue all other medications as before, and refills have been done if requested.  Please have the pharmacy call with any other refills you may need.  Please continue your efforts at being more active, low cholesterol diet, and weight control.  Please keep your appointments with your specialists as you may have planned

## 2021-07-07 NOTE — Assessment & Plan Note (Signed)
With situational worsening, for xanax prn,  to f/u any worsening symptoms or concerns

## 2021-07-07 NOTE — Assessment & Plan Note (Signed)
D/w pt, no specific advice on diagnosis, treatment and prognosis can be given until biopsy and f/u with oncology

## 2021-07-07 NOTE — Assessment & Plan Note (Signed)
Lab Results  Component Value Date   HGBA1C 5.7 07/28/2020   Stable, pt to continue current medical treatment  - diet

## 2021-07-11 ENCOUNTER — Other Ambulatory Visit: Payer: Self-pay | Admitting: Student

## 2021-07-11 ENCOUNTER — Other Ambulatory Visit: Payer: Self-pay | Admitting: Internal Medicine

## 2021-07-12 ENCOUNTER — Ambulatory Visit (HOSPITAL_COMMUNITY)
Admission: RE | Admit: 2021-07-12 | Discharge: 2021-07-12 | Disposition: A | Discharge status: Home / Self Care | Payer: Medicare HMO | Source: Ambulatory Visit | Attending: Physician Assistant | Admitting: Physician Assistant

## 2021-07-12 ENCOUNTER — Ambulatory Visit (HOSPITAL_COMMUNITY): Payer: Medicare HMO

## 2021-07-12 ENCOUNTER — Encounter (HOSPITAL_COMMUNITY): Payer: Self-pay

## 2021-07-12 ENCOUNTER — Other Ambulatory Visit: Payer: Self-pay

## 2021-07-12 DIAGNOSIS — C787 Secondary malignant neoplasm of liver and intrahepatic bile duct: Secondary | ICD-10-CM | POA: Insufficient documentation

## 2021-07-12 DIAGNOSIS — C801 Malignant (primary) neoplasm, unspecified: Secondary | ICD-10-CM | POA: Diagnosis not present

## 2021-07-12 DIAGNOSIS — Z452 Encounter for adjustment and management of vascular access device: Secondary | ICD-10-CM | POA: Diagnosis not present

## 2021-07-12 DIAGNOSIS — C799 Secondary malignant neoplasm of unspecified site: Secondary | ICD-10-CM

## 2021-07-12 HISTORY — PX: IR US GUIDE BX ASP/DRAIN: IMG2392

## 2021-07-12 HISTORY — PX: IR IMAGING GUIDED PORT INSERTION: IMG5740

## 2021-07-12 LAB — CBC
HCT: 36.8 % — ABNORMAL LOW (ref 39.0–52.0)
Hemoglobin: 12 g/dL — ABNORMAL LOW (ref 13.0–17.0)
MCH: 30.9 pg (ref 26.0–34.0)
MCHC: 32.6 g/dL (ref 30.0–36.0)
MCV: 94.8 fL (ref 80.0–100.0)
Platelets: 221 10*3/uL (ref 150–400)
RBC: 3.88 MIL/uL — ABNORMAL LOW (ref 4.22–5.81)
RDW: 12.8 % (ref 11.5–15.5)
WBC: 10.2 10*3/uL (ref 4.0–10.5)
nRBC: 0 % (ref 0.0–0.2)

## 2021-07-12 LAB — PROTIME-INR
INR: 1 (ref 0.8–1.2)
Prothrombin Time: 13.5 seconds (ref 11.4–15.2)

## 2021-07-12 IMAGING — US IR IMAGING GUIDED PORT INSERTION
1 series · 1 of 1 positions shown · non-contrast
Comparison: none

CLINICAL DATA: Evidence of metastatic carcinoma to the liver of
unknown primary.

[Series 1: ir fluoro/shunt/fist · 1 of 1 slices shown]
[im 1/1]
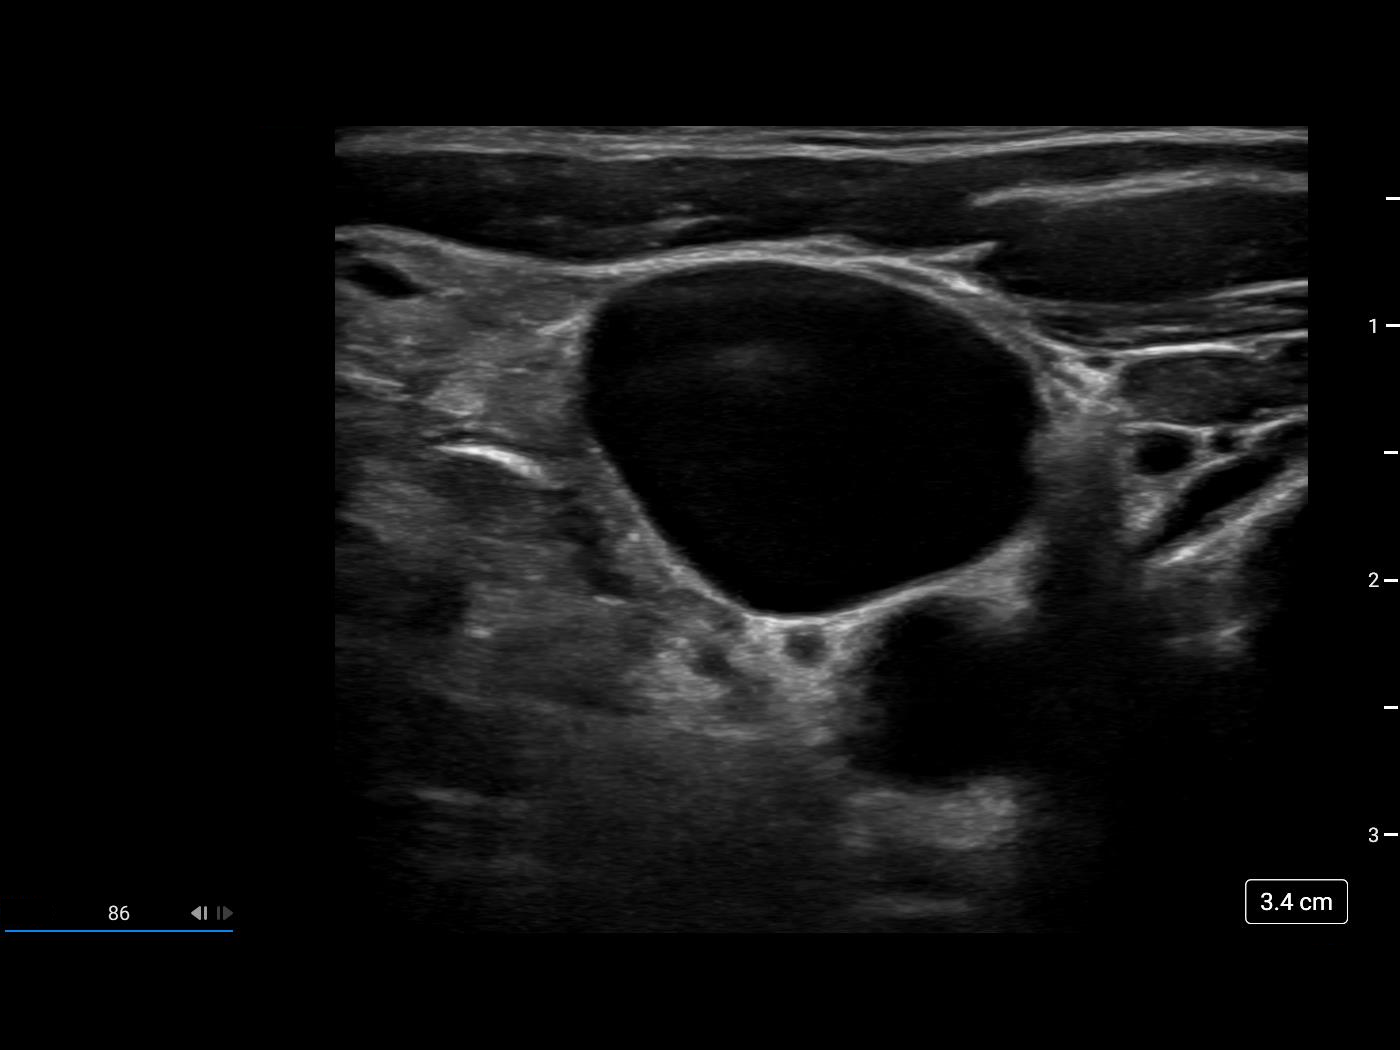

[1 of 1 positions shown; findings below may reference images not displayed]

EXAM:
IMPLANTED PORT A CATH PLACEMENT WITH ULTRASOUND AND FLUOROSCOPIC
GUIDANCE

ANESTHESIA/SEDATION:
Moderate (conscious) sedation was employed during this procedure. A
total of Versed 3.0 mg and Fentanyl 150 mcg was administered
intravenously by radiology nursing under my supervision.

Moderate Sedation Time: 30 minutes. The patient's level of
consciousness and vital signs were monitored continuously by
radiology nursing throughout the procedure under my direct
supervision.

FLUOROSCOPY TIME:  33 seconds.  2.0 mGy.

PROCEDURE:
The procedure, risks, benefits, and alternatives were explained to
the patient. Questions regarding the procedure were encouraged and
answered. The patient understands and consents to the procedure. A
time-out was performed prior to initiating the procedure.

Ultrasound was utilized to confirm patency of the right internal
jugular vein. The right neck and chest were prepped with
chlorhexidine in a sterile fashion, and a sterile drape was applied
covering the operative field. Maximum barrier sterile technique with
sterile gowns and gloves were used for the procedure. Local
anesthesia was provided with 1% lidocaine.

After creating a small venotomy incision, a 21 gauge needle was
advanced into the right internal jugular vein under direct,
real-time ultrasound guidance. Ultrasound image documentation was
performed. After securing guidewire access, an 8 Fr dilator was
placed. A J-wire was kinked to measure appropriate catheter length.

A subcutaneous port pocket was then created along the upper chest
wall utilizing sharp and blunt dissection. Portable cautery was
utilized. The pocket was irrigated with sterile saline.

A single lumen power injectable port was chosen for placement. The 8
Fr catheter was tunneled from the port pocket site to the venotomy
incision. The port was placed in the pocket. External catheter was
trimmed to appropriate length based on guidewire measurement.

At the venotomy, an 8 Fr peel-away sheath was placed over a
guidewire. The catheter was then placed through the sheath and the
sheath removed. Final catheter positioning was confirmed and
documented with a fluoroscopic spot image. The port was accessed
with a needle and aspirated and flushed with heparinized saline. The
access needle was removed.

The venotomy and port pocket incisions were closed with subcutaneous
3-0 Monocryl and subcuticular 4-0 Vicryl. Dermabond was applied to
both incisions.

COMPLICATIONS:
COMPLICATIONS
None
FINDINGS: After catheter placement, the tip lies at the WENZI junction.
The catheter aspirates normally and is ready for immediate use.
IMPRESSION: Placement of single lumen port a cath via right internal jugular
vein. The catheter tip lies at the WENZI junction. A power
injectable port a cath was placed and is ready for immediate use.

## 2021-07-12 IMAGING — XA IR US GUIDANCE
1 series · 5 of 5 positions shown · non-contrast
Comparison: none

CLINICAL DATA: PRASAD liver lesions suspicious for metastatic
carcinoma. No known primary carcinoma.

[Series 1: ir us guidance · 5 of 5 slices shown]
[im 1/5]
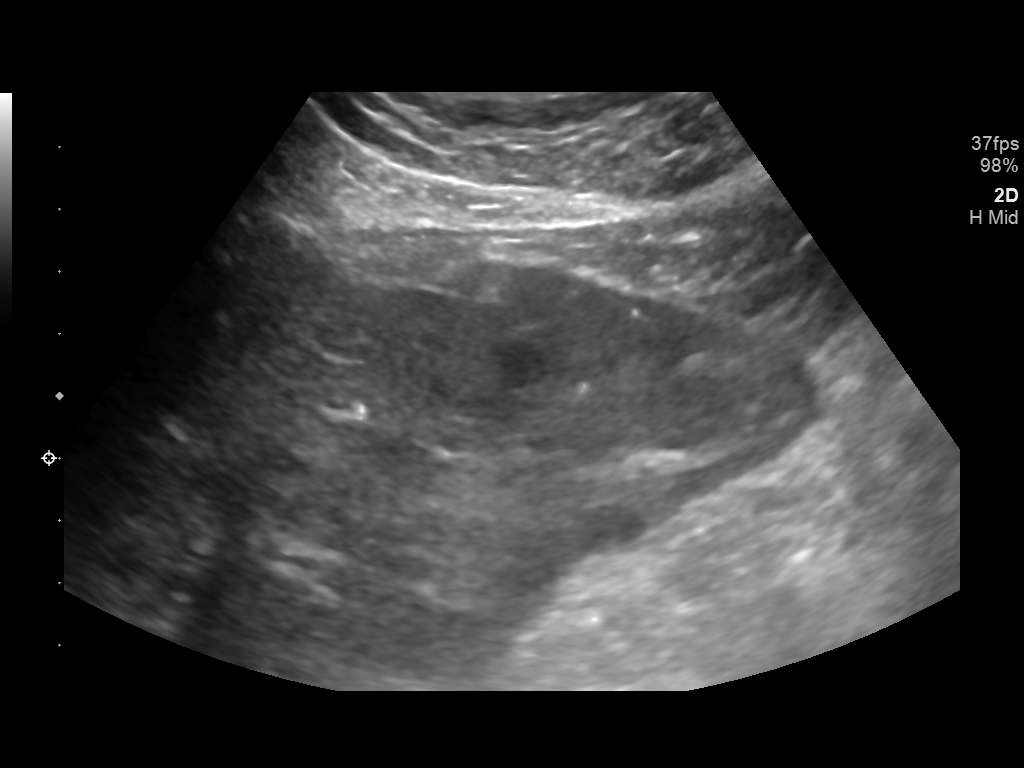
[im 2/5]
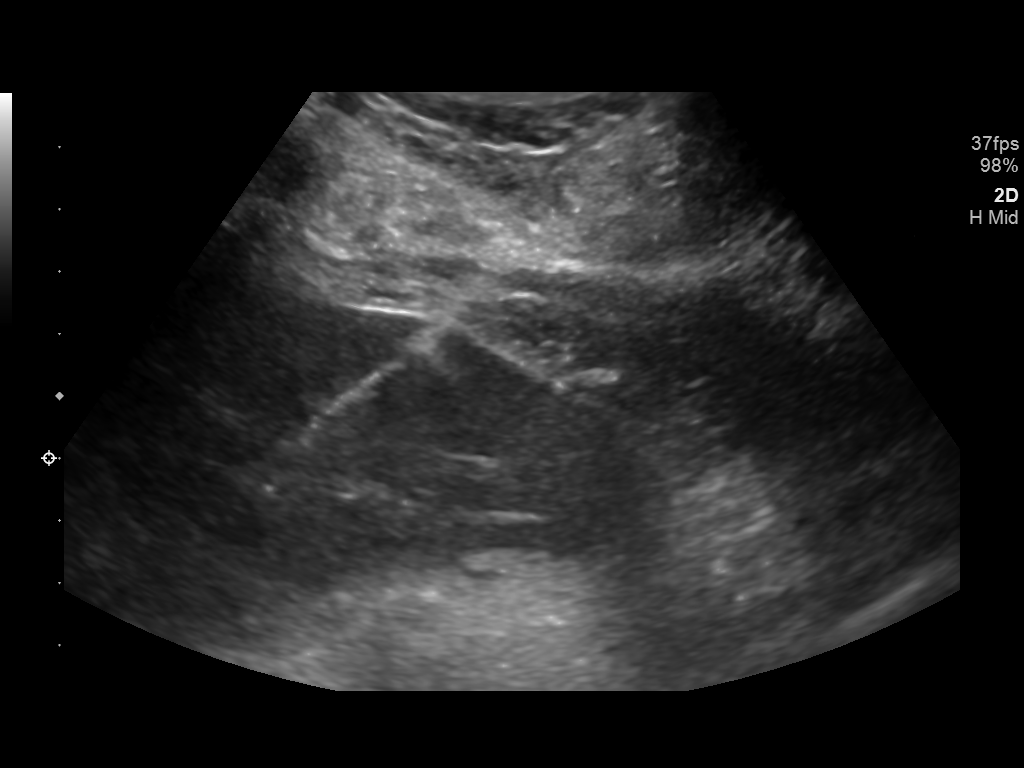
[im 3/5]
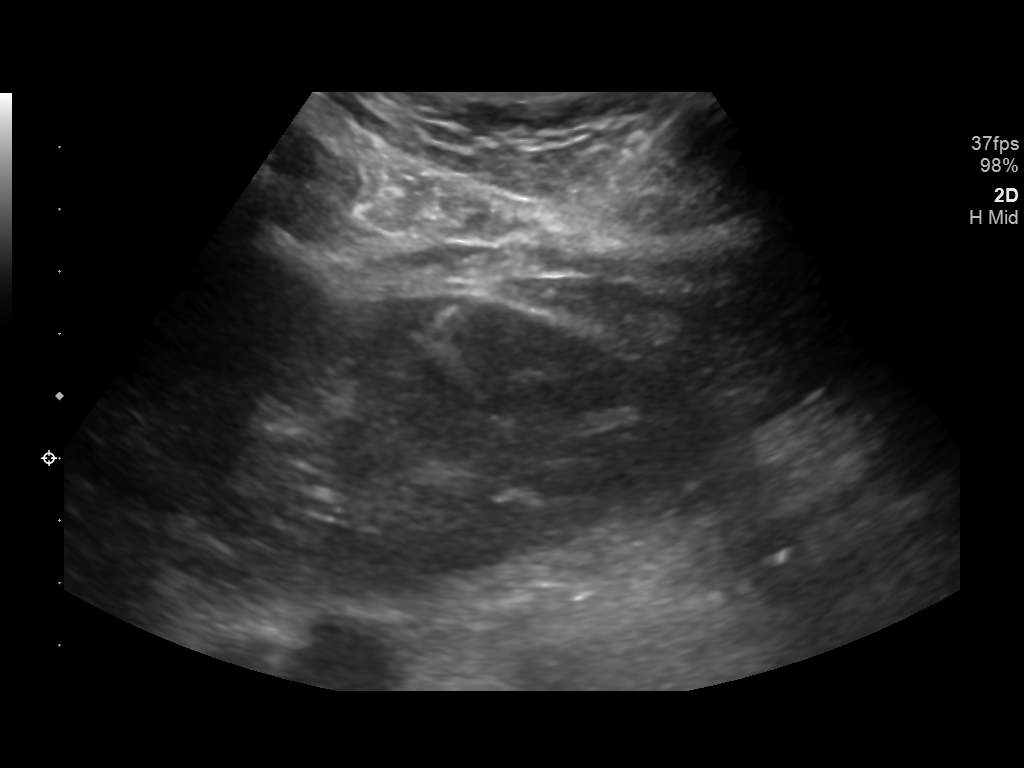
[im 4/5]
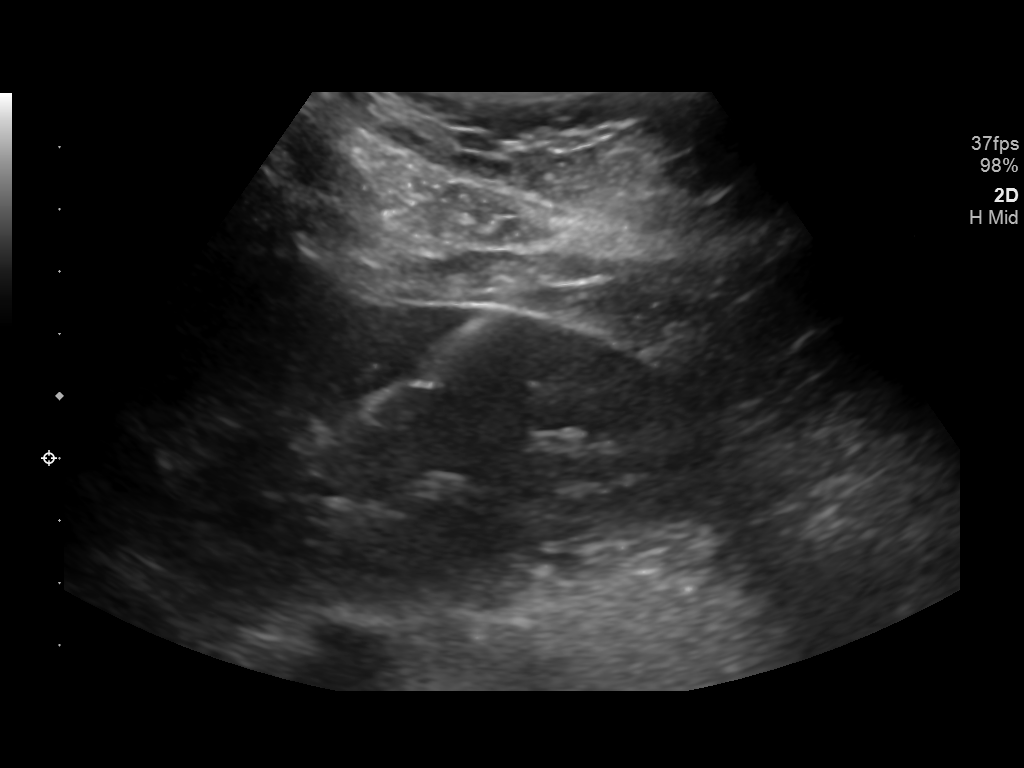
[im 5/5]
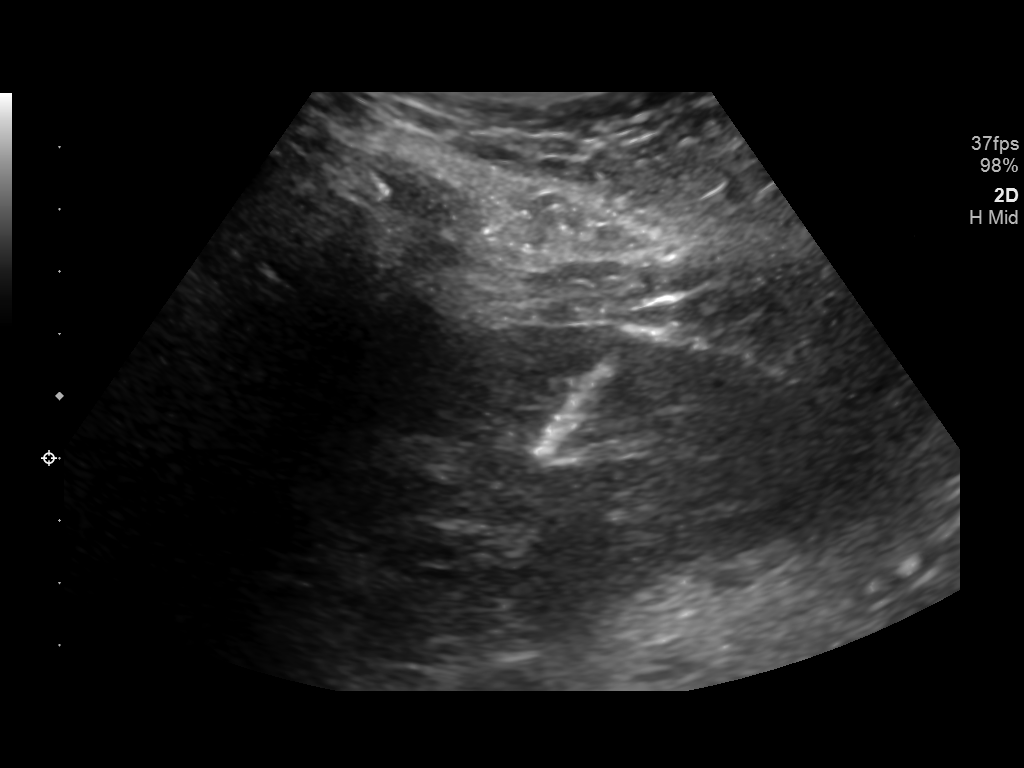

[5 of 5 positions shown; findings below may reference images not displayed]

EXAM:
ULTRASOUND GUIDED CORE BIOPSY OF LIVER

MEDICATIONS:
Moderate (conscious) sedation was employed during this procedure. A
total of Versed 1.0 mg and Fentanyl 50 mcg was administered
intravenously by radiology nursing under my supervision.

Moderate Sedation Time: 18 minutes. The patient's level of
consciousness and vital signs were monitored continuously by
radiology nursing throughout the procedure under my direct
supervision.

PROCEDURE:
The procedure, risks, benefits, and alternatives were explained to
the patient. Questions regarding the procedure were encouraged and
answered. The patient understands and consents to the procedure. A
time out was performed prior to initiating the procedure.

The abdominal wall was prepped with chlorhexidine in a sterile
fashion, and a sterile drape was applied covering the operative
field. A sterile gown and sterile gloves were used for the
procedure. Local anesthesia was provided with 1% Lidocaine.

After localizing liver lesions, a 17 gauge trocar needle was
advanced into the left lobe of the liver under ultrasound guidance.
After confirming needle tip position, 3 separate coaxial 18 gauge
core biopsy samples were obtained and submitted in formalin.
Gel-Foam pledgets were advanced through the outer needle as the
needle was retracted and removed. Additional ultrasound was
performed.

COMPLICATIONS:
None.
FINDINGS: Multiple ill-defined lesions are seen throughout the liver
parenchyma. The left lobe was chosen for sampling at the level of
multiple lesions.
IMPRESSION: Ultrasound-guided core biopsy performed of lesions within the left
lobe of the liver.

## 2021-07-12 MED ORDER — FENTANYL CITRATE (PF) 100 MCG/2ML IJ SOLN
INTRAMUSCULAR | Status: AC
  Filled 2021-07-12: qty 4

## 2021-07-12 MED ORDER — MIDAZOLAM HCL 2 MG/2ML IJ SOLN
INTRAMUSCULAR | Status: AC | PRN
  Administered 2021-07-12: 2 mg via INTRAVENOUS
  Administered 2021-07-12: 1 mg via INTRAVENOUS

## 2021-07-12 MED ORDER — SODIUM CHLORIDE 0.9 % IV SOLN: INTRAVENOUS | Status: DC

## 2021-07-12 MED ORDER — GELATIN ABSORBABLE 12-7 MM EX MISC
CUTANEOUS | Status: AC
  Filled 2021-07-12: qty 1

## 2021-07-12 MED ORDER — MIDAZOLAM HCL 2 MG/2ML IJ SOLN
INTRAMUSCULAR | Status: AC
  Filled 2021-07-12: qty 4

## 2021-07-12 MED ORDER — HYDROCODONE-ACETAMINOPHEN 5-325 MG PO TABS
1.0000 | ORAL_TABLET | ORAL | Status: DC | PRN
Start: 2021-07-12 — End: 2021-07-13

## 2021-07-12 MED ORDER — HEPARIN SOD (PORK) LOCK FLUSH 100 UNIT/ML IV SOLN
INTRAVENOUS | Status: AC
  Administered 2021-07-12: 500 [IU]
  Filled 2021-07-12: qty 5

## 2021-07-12 MED ORDER — MIDAZOLAM HCL 2 MG/2ML IJ SOLN
INTRAMUSCULAR | Status: AC | PRN
  Administered 2021-07-12: 1 mg via INTRAVENOUS

## 2021-07-12 MED ORDER — LIDOCAINE HCL 1 % IJ SOLN
INTRAMUSCULAR | Status: AC
  Administered 2021-07-12: 10 mL
  Filled 2021-07-12: qty 20

## 2021-07-12 MED ORDER — FENTANYL CITRATE (PF) 100 MCG/2ML IJ SOLN
INTRAMUSCULAR | Status: AC | PRN
  Administered 2021-07-12: 50 ug via INTRAVENOUS
  Administered 2021-07-12: 100 ug via INTRAVENOUS

## 2021-07-12 MED ORDER — FENTANYL CITRATE (PF) 100 MCG/2ML IJ SOLN
INTRAMUSCULAR | Status: AC | PRN
  Administered 2021-07-12: 50 ug via INTRAVENOUS

## 2021-07-12 NOTE — Procedures (Signed)
Interventional Radiology Procedure Note  Procedure: US Guided Biopsy of liver lesion  Complications: None  Estimated Blood Loss: < 10 mL  Findings: 18 G core biopsy of left lobe liver lesion(s) performed under US guidance.  Three core samples obtained and sent to Pathology.  Venetia Night. Kathlene Cote, M.D Pager:  [phone]

## 2021-07-12 NOTE — H&P (Signed)
Chief Complaint: Patient was seen in consultation today for liver bx and port placement at the request of [name],[name]  Referring Physician(s): Lincoln Brigham  Supervising Physician: Aletta Edouard  Patient Status: [facility] - Out-pt  History of Present Illness: Billy [name] is a 80 y.o. male with newly found liver masses highly suspicious for metastatic process. He is referred for image guided biopsy as well as port placement. PMHx, meds, labs, imaging, allergies reviewed. ASA held 5 days as directed Feels well, no recent fevers, chills, illness. Has been NPO today as directed.   Past Medical History:  Diagnosis Date   Basal cell carcinoma 05/29/2012   left thigh-(EXC)   Basal cell carcinoma 10/23/2012   right side of nose (MOHS)   Basal cell carcinoma 12/12/2012   nod-irght cheek (MOHS)   Basal cell carcinoma 05/26/2015   nod-left calf (CX35FU)   Basal cell carcinoma 05/26/2015   nod-left nose (txpbx)   Basal cell carcinoma 06/08/2016   nod-above right brow (CX35FU)   Basal cell carcinoma 09/12/2016   left nose (MOHS)   Basal cell carcinoma 01/29/2019   nod-right tip of nose (MOHS)   Benign neoplasm of colon    Finger fracture    Hx of colonoscopy    Hyperlipidemia    Hypertension    Internal hemorrhoids without mention of complication    Myocardial infarction ([place])    09/01/2015   Plantar fascial fibromatosis    Prostatitis    Rib fracture    Squamous cell carcinoma of skin 07/13/2009   RIght lower outer leg   Squamous cell carcinoma of skin 02/17/2014   in situ-right temple (txpbx)   Squamous cell carcinoma of skin 01/24/2018   in situ-right chest (txpbx)   Squamous cell carcinoma of skin 01/24/2018   in situ0 right forehead (txpbx)   Squamous cell carcinoma of skin     Past Surgical History:  Procedure Laterality Date   cardiac stint  2017   COLONOSCOPY  2016   POLYPECTOMY     TONSILLECTOMY     VARICOCELECTOMY     Left     Allergies: Ofloxacin  Medications: Prior to Admission medications   Medication Sig Start Date End Date Taking? Authorizing Provider  ALPRAZolam Duanne Moron) 0.5 MG tablet Take 1 tablet (0.5 mg total) by mouth 2 (two) times daily as needed for anxiety. 07/07/21  Yes Biagio Borg, MD  atorvastatin (LIPITOR) 80 MG tablet Take 1 tablet (80 mg total) by mouth daily at 6 PM. 01/27/21  Yes Hilty, Nadean Corwin, MD  Cholecalciferol (VITAMIN D3 PO) Take 1 tablet by mouth daily.   Yes [provider]  lisinopril (ZESTRIL) 2.5 MG tablet Take 1 tablet (2.5 mg total) by mouth daily. 12/01/20  Yes Biagio Borg, MD  aspirin EC 81 MG tablet Take 81 mg by mouth daily.    [provider]  nitroGLYCERIN (NITROSTAT) 0.4 MG SL tablet Place 1 tablet (0.4 mg total) under the tongue every 5 (five) minutes as needed for chest pain. Max 3 doses. 03/13/18   Hilty, Nadean Corwin, MD  Omega-3 Fatty Acids (FISH OIL) 500 MG CAPS Take 500 mg by mouth daily.    [provider]     Family History  Problem Relation Age of Onset   Heart disease Mother        ? CAD. died 85   Stroke Father        at age 1, smoked cigar   Diabetes Neg Hx  Prostate cancer Neg Hx    Colon cancer Neg Hx    Stomach cancer Neg Hx    Rectal cancer Neg Hx    Colon polyps Neg Hx    Esophageal cancer Neg Hx     Social History   Socioeconomic History   Marital status: Married    Spouse name: Not on file   Number of children: 2   Years of education: Not on file   Highest education level: Not on file  Occupational History   Occupation: Retired  Tobacco Use   Smoking status: Former    Packs/day: 1.00    Years: 20.00    Pack years: 20.00    Types: Cigarettes    Quit date: 08/07/1978    Years since quitting: 42.9   Smokeless tobacco: Never  Vaping Use   Vaping Use: Never used  Substance and Sexual Activity   Alcohol use: Yes    Alcohol/week: 25.0 standard drinks    Types: 10 Cans of beer, 15 Standard drinks or  equivalent per week   Drug use: No   Sexual activity: Not on file  Other Topics Concern   Not on file  Social History Narrative   [employer], [place]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, clinical (histocompatibility and immunogenetics); Estate manager/land agent license   [employer] - 6 years      Married - '69   1 son - '78, 1 daughter '74 2 grandchildren      Retired - stays busy, 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  All other systems are negative.  Review of Systems  Vital Signs: BP (!) 148/84   Pulse (!) 53   Temp 97.9 F (36.6 C) (Oral)   Resp 18   SpO2 100%   Physical Exam Constitutional:      Appearance: Normal appearance. He is not ill-appearing.  HENT:     Mouth/Throat:     Mouth: Mucous membranes are moist.     Pharynx: Oropharynx is clear.  Cardiovascular:     Rate and Rhythm: Normal rate and regular rhythm.     Heart sounds: Normal heart sounds.  Pulmonary:     Effort: Pulmonary effort is normal. No respiratory distress.     Breath sounds: Normal breath sounds.  Neurological:     General: No focal deficit present.     Mental Status: He is alert and oriented to person, place, and time.  Psychiatric:        Mood and Affect: Mood normal.        Thought Content: Thought content normal.        Judgment: Judgment normal.    Imaging: CT Angio Chest PE W and/or Wo Contrast  Result Date: 07/01/2021 CLINICAL DATA:  Positive D-dimer.  Chest pain. EXAM: CT ANGIOGRAPHY CHEST WITH CONTRAST TECHNIQUE: Multidetector CT imaging of the chest was performed using the standard protocol during bolus administration of  intravenous contrast. Multiplanar CT image reconstructions and MIPs were obtained to evaluate the vascular anatomy. CONTRAST:  23mL OMNIPAQUE IOHEXOL  350 MG/ML SOLN COMPARISON:  Current chest radiograph. FINDINGS: Cardiovascular: Pulmonary arteries are well opacified. There is no evidence of a pulmonary embolism. Heart mildly enlarged. No pericardial effusion. Left coronary artery calcifications. Ascending thoracic aorta measures 4.7 cm in diameter. Mild aortic atherosclerosis. No convincing dissection. Mediastinum/Nodes: No enlarged mediastinal, hilar, or axillary lymph nodes. Thyroid gland, trachea, and esophagus demonstrate no significant findings. Lungs/Pleura: Multiple subcentimeter lung nodules, largest at the diaphragmatic base of the right middle lobe and subpleural left lower lobe, image 67, series 7, both 5 mm. No lung consolidation to suggest pneumonia. No evidence of pulmonary edema. Mild apical pleuroparenchymal scarring. No pleural effusion or pneumothorax. Upper Abdomen: Ill-defined and partly imaged mass along the inferior porta hepatis, suspicious for a pancreatic neoplasm. Margins are ill-defined. There are multiple low-attenuation liver masses, scattered throughout the liver, largest in the anterior aspect of the left lobe lateral segment, bulging the overlying surface contour, 4 cm in greatest dimension. Prominent Peri celiac lymph nodes, largest on the left measuring 1.7 cm in short axis. Musculoskeletal: No acute fracture or acute finding. No osteoblastic or osteolytic lesions. No chest wall masses. Review of the MIP images confirms the above findings. IMPRESSION: 1. No evidence of a pulmonary embolism. 2. Findings consistent with neoplastic disease. Multiple liver masses consistent with widespread hepatic metastatic disease. Ill-defined partly imaged mass along the porta hepatis suspicious for a pancreatic malignancy. There also multiple subcentimeter lung nodules consistent with metastatic  disease. Recommend follow-up CT of the abdomen and pelvis with contrast for further assessment 3. No acute findings in the chest. 4. Dilated ascending thoracic aorta to 4.7 cm. Ascending thoracic aortic aneurysm. Recommend semi-annual imaging followup by CTA or MRA and referral to cardiothoracic surgery if not already obtained. This recommendation follows 2010 ACCF/AHA/AATS/ACR/ASA/SCA/SCAI/SIR/STS/SVM Guidelines for the Diagnosis and Management of Patients With Thoracic Aortic Disease. Circulation. 2010; 121: T614-E315. Aortic aneurysm NOS (ICD10-I71.9) Aortic aneurysm NOS (ICD10-I71.9). Electronically Signed   By: Lajean Manes M.D.   On: 07/01/2021 14:18   CT ABDOMEN PELVIS W CONTRAST  Result Date: 07/01/2021 CLINICAL DATA:  Abdominal pain, acute. EXAM: CT ABDOMEN AND PELVIS WITH CONTRAST TECHNIQUE: Multidetector CT imaging of the abdomen and pelvis was performed using the standard protocol following bolus administration of intravenous contrast. CONTRAST:  179mL OMNIPAQUE IOHEXOL 300 MG/ML  SOLN COMPARISON:  CT angiogram chest 07/01/2021. FINDINGS: Lower chest: No acute abnormality. Hepatobiliary: There are numerous hypodense liver lesions. The largest lesion is seen in the inferior right lobe measuring 5.9 x 3.0 x 4.7 cm. The gallbladder bile ducts are within normal limits. Pancreas: Unremarkable. No pancreatic ductal dilatation or surrounding inflammatory changes. Spleen: Normal in size without focal abnormality. Adrenals/Urinary Tract: Adrenal glands are unremarkable. Kidneys are normal, without renal calculi, focal lesion, or hydronephrosis. There are hypodensities in the left kidney which are too small to characterize, most likely cysts. Otherwise, the kidneys, adrenal glands and bladder are within normal limits. Stomach/Bowel: Stomach is within normal limits. No evidence of bowel wall thickening, distention, or inflammatory changes. The appendix is not seen. Vascular/Lymphatic: Abdominal aorta is  normal in size. There are atherosclerotic calcifications of the aorta. There are enlarged periportal lymph nodes, low-density, measuring up to 2.8 x 2.2 cm. There is an enlarged portacaval lymph node measuring 3.6 x 1.3 cm. There is an enlarged peripancreatic lymph node measuring 2.0 x 1.5 cm image 3/17. There are enlarged aortocaval lymph nodes measuring up to 2.7 x 1.8 cm. Reproductive: Prostate is unremarkable. Other: There is a small fat containing  right inguinal hernia. There is a moderate-sized left inguinal hernia containing nondilated colon. There is no ascites. Musculoskeletal: No acute or significant osseous findings. IMPRESSION: 1. Numerous hypodense hepatic lesions with dominant lesion in the inferior right lobe measuring up to 5.9 cm. Findings worrisome for diffuse metastatic disease. Primary hepatic neoplasm not excluded in the inferior right lobe. 2. Pathologically enlarged periportal, portal caval, peripancreatic and aortocaval lymph nodes worrisome for metastatic disease. 3. Bilateral inguinal hernias. Left inguinal hernia contains colon. No bowel obstruction. 4.  Aortic Atherosclerosis (ICD10-I70.0). Electronically Signed   By: Ronney Asters M.D.   On: 07/01/2021 17:39   DG Chest Port 1 View  Result Date: 07/01/2021 CLINICAL DATA:  79 year old male with chest pain. EXAM: PORTABLE CHEST - 1 VIEW COMPARISON:  04/18/2016 FINDINGS: The mediastinal contours are within normal limits. No cardiomegaly. Coronary artery stent is visualized. Atherosclerotic calcification of the aortic arch. The lungs are clear bilaterally without evidence of focal consolidation, pleural effusion, or pneumothorax. No acute osseous abnormality. IMPRESSION: No acute cardiopulmonary process. Aortic Atherosclerosis (ICD10-I70.0). Electronically Signed   By: Ruthann Cancer M.D.   On: 07/01/2021 11:52    Labs:  CBC: Recent Labs    07/28/20 1051 07/01/21 1115 07/06/21 1430  WBC 7.7 9.8 10.1  HGB 14.6 13.5 13.5  HCT  43.5 41.0 40.6  PLT 265.0 260 249    COAGS: No results for input(s): INR, APTT in the last 8760 hours.  BMP: Recent Labs    07/28/20 1051 07/01/21 1115 07/06/21 1430  NA 138 137 138  K 4.5 4.7 4.7  CL 104 106 105  CO2 28 25 24   GLUCOSE 98 118* 98  BUN 18 16 20   CALCIUM 9.0 8.9 8.9  CREATININE 0.95 0.88 0.94  GFRNONAA  --  >60 >60    LIVER FUNCTION TESTS: Recent Labs    07/28/20 1051 07/01/21 1315 07/06/21 1430  BILITOT 1.1 1.2 0.8  AST 20 64* 83*  ALT 21 29 52*  ALKPHOS 57 105 144*  PROT 6.9 6.0* 6.9  ALBUMIN 4.3 3.2* 3.6    TUMOR MARKERS: No results for input(s): AFPTM, CEA, CA199, CHROMGRNA in the last 8760 hours.  Assessment and Plan: Liver mass concerning for metastatic process. Plan for image guided biopsy of liver lesion as well as port placement. Labs reviewed. Risks and benefits of image guided port-a-catheter placement was discussed with the patient including, but not limited to bleeding, infection, pneumothorax, or fibrin sheath development and need for additional procedures. Risks and benefits of liver bx was discussed with the patient and/or patient's family including, but not limited to bleeding, infection, damage to adjacent structures or low yield requiring additional tests. All of the patient's questions were answered, patient is agreeable to proceed. Consent signed and in chart.   Thank you for this interesting consult.  I greatly enjoyed meeting Joseph Bias Feldner and look forward to participating in their care.  A copy of this report was sent to the requesting provider on this date.  Electronically Signed: Ascencion Dike, PA-C 07/12/2021, 11:36 AM   I spent a total of 20 minutes in face to face in clinical consultation, greater than 50% of which was counseling/coordinating care for liver biopsy and port

## 2021-07-12 NOTE — Procedures (Signed)
Interventional Radiology Procedure Note  Procedure: Single Lumen Power Port Placement    Access:  Right IJ vein.  Findings: Catheter tip positioned at SVC/RA junction. Port is ready for immediate use.   Complications: None  EBL: < 10 mL  Recommendations:  - Ok to shower in 24 hours - Do not submerge for 7 days - Routine line care   Ardon Franklin T. Lucindia Lemley, M.D Pager:  [phone]   

## 2021-07-12 NOTE — Discharge Instructions (Signed)
Interventional radiology phone numbers [phone] After hours [phone]    You have skin glue (dermabond) over your new port. Do not use the lidocaine cream (EMLA cream) over the skin glue until it has healed. The petroleum in the lidocaine cream will dissolve the skin glue resulting in an infection of your new port. Use ice in a zip lock bag for 1-2 minutes over your new port before the cancer center nurses access your port.   Implanted Port Insertion, Care After This sheet gives you information about how to care for yourself after your procedure. Your health care provider may also give you more specific instructions. If you have problems or questions, contact your health care provider. What can I expect after the procedure? After the procedure, it is common to have: Discomfort at the port insertion site. Bruising on the skin over the port. This should improve over 3-4 days. Follow these instructions at home: Port care After your port is placed, you will get a manufacturer's information card. The card has information about your port. Keep this card with you at all times. Take care of the port as told by your health care provider. Ask your health care provider if you or a family member can get training for taking care of the port at home. A home health care nurse may also take care of the port. Make sure to remember what type of port you have. Incision care Follow instructions from your health care provider about how to take care of your port insertion site. Make sure you: Wash your hands with soap and water before and after you change your bandage (dressing). If soap and water are not available, use hand sanitizer. Change your dressing as told by your health care provider. Leave skin glue in place. These skin closures may need to stay in place for 2 weeks or longer.  Check your port insertion site every day for signs of infection. Check for: Redness, swelling, or pain. Fluid or  blood. Warmth. Pus or a bad smell.      Activity Return to your normal activities as told by your health care provider. Ask your health care provider what activities are safe for you. Do not lift anything that is heavier than 10 lb (4.5 kg), or the limit that you are told, until your health care provider says that it is safe. General instructions Take over-the-counter and prescription medicines only as told by your health care provider. Do not take baths, swim, or use a hot tub until your health care provider approves.You may remove your dressing tomorrow and shower 24 hours after your procedure. Do not drive for 24 hours if you were given a sedative during your procedure. Wear a medical alert bracelet in case of an emergency. This will tell any health care providers that you have a port. Keep all follow-up visits as told by your health care provider. This is important. Contact a health care provider if: You cannot flush your port with saline as directed, or you cannot draw blood from the port. You have a fever or chills. You have redness, swelling, or pain around your port insertion site. You have fluid or blood coming from your port insertion site. Your port insertion site feels warm to the touch. You have pus or a bad smell coming from the port insertion site. Get help right away if: You have chest pain or shortness of breath. You have bleeding from your port that you cannot control. Summary Take care of   the port as told by your health care provider. Keep the manufacturer's information card with you at all times. Change your dressing as told by your health care provider. Contact a health care provider if you have a fever or chills or if you have redness, swelling, or pain around your port insertion site. Keep all follow-up visits as told by your health care provider. This information is not intended to replace advice given to you by your health care provider. Make sure you discuss any  questions you have with your health care provider. Document Revised: 02/19/2018 Document Reviewed: 02/19/2018 Elsevier Patient Education  2021 [employer].    Moderate Conscious Sedation, Adult, Care After This sheet gives you information about how to care for yourself after your procedure. Your health care provider may also give you more specific instructions. If you have problems or questions, contact your health care provider. What can I expect after the procedure? After the procedure, it is common to have: Sleepiness for several hours. Impaired judgment for several hours. Difficulty with balance. Vomiting if you eat too soon. Follow these instructions at home: For the time period you were told by your health care provider: Rest. Do not participate in activities where you could fall or become injured. Do not drive or use machinery. Do not drink alcohol. Do not take sleeping pills or medicines that cause drowsiness. Do not make important decisions or sign legal documents. Do not take care of children on your own.      Eating and drinking Follow the diet recommended by your health care provider. Drink enough fluid to keep your urine pale yellow. If you vomit: Drink water, juice, or soup when you can drink without vomiting. Make sure you have little or no nausea before eating solid foods.   General instructions Take over-the-counter and prescription medicines only as told by your health care provider. Have a responsible adult stay with you for the time you are told. It is important to have someone help care for you until you are awake and alert. Do not smoke. Keep all follow-up visits as told by your health care provider. This is important. Contact a health care provider if: You are still sleepy or having trouble with balance after 24 hours. You feel light-headed. You keep feeling nauseous or you keep vomiting. You develop a rash. You have a fever. You have redness or  swelling around the IV site. Get help right away if: You have trouble breathing. You have new-onset confusion at home. Summary After the procedure, it is common to feel sleepy, have impaired judgment, or feel nauseous if you eat too soon. Rest after you get home. Know the things you should not do after the procedure. Follow the diet recommended by your health care provider and drink enough fluid to keep your urine pale yellow. Get help right away if you have trouble breathing or new-onset confusion at home. This information is not intended to replace advice given to you by your health care provider. Make sure you discuss any questions you have with your health care provider. Document Revised: 11/21/2019 Document Reviewed: 06/19/2019 Elsevier Patient Education  2021 [employer].   Liver Biopsy, Care After These instructions give you information on caring for yourself after your procedure. Your doctor may also give you more specific instructions. Call your doctor if you have any problems or questions after your procedure. What can I expect after the procedure? After the procedure, it is common to have: Pain and soreness  where the biopsy was done. Bruising around the area where the biopsy was done. Sleepiness and be tired for a few days. Follow these instructions at home: Medicines Take over-the-counter and prescription medicines only as told by your doctor. If you were prescribed an antibiotic medicine, take it as told by your doctor. Do not stop taking the antibiotic even if you start to feel better. Do not take medicines such as aspirin and ibuprofen. These medicines can thin your blood. Do not take these medicines unless your doctor tells you to take them. If you are taking prescription pain medicine, take actions to prevent or treat constipation. Your doctor may recommend that you: Drink enough fluid to keep your pee (urine) clear or pale yellow. Take over-the-counter or prescription  medicines. Eat foods that are high in fiber, such as fresh fruits and vegetables, whole grains, and beans. Limit foods that are high in fat and processed sugars, such as fried and sweet foods. Caring for your cut Follow instructions from your doctor about how to take care of your cuts from surgery (incisions). Make sure you: Wash your hands with soap and water before you change your bandage (dressing). If you cannot use soap and water, use hand sanitizer. Change your bandage as told by your doctor. Check your cuts every day for signs of infection. Check for: Redness, swelling, or more pain. Fluid or blood. Pus or a bad smell. Warmth. Do not take baths, swim, or use a hot tub until your doctor says it is okay to do so. You may remove your dressing tomorrow and shower. Activity Rest at home for 1-2 days or as told by your doctor. Avoid sitting for a long time without moving. Get up to take short walks every 1-2 hours. Return to your normal activities as told by your doctor. Ask what activities are safe for you. Do not do these things in the first 24 hours: Drive. Use machinery. Take a bath or shower. Do not lift more than 10 pounds (4.5 kg) or play contact sports for the first 2 weeks.   General instructions Do not drink alcohol in the first week after the procedure. Have someone stay with you for at least 24 hours after the procedure. Get your test results. Ask your doctor or the department that is doing the test: When will my results be ready? How will I get my results? What are my treatment options? What other tests do I need? What are my next steps? Keep all follow-up visits as told by your doctor. This is important.   Contact a doctor if: A cut bleeds and leaves more than just a small spot of blood. A cut is red, puffs up (swells), or hurts more than before. Fluid or something else comes from a cut. A cut smells bad. You have a fever or chills. Get help right away if: You  have swelling, bloating, or pain in your belly (abdomen). You get dizzy or faint. You have a rash. You feel sick to your stomach (nauseous) or throw up (vomit). You have trouble breathing, feel short of breath, or feel faint. Your chest hurts. You have problems talking or seeing. You have trouble with your balance or moving your arms or legs. Summary After the procedure, it is common to have pain, soreness, bruising, and tiredness. Your doctor will tell you how to take care of yourself at home. Change your bandage, take your medicines, and limit your activities as told by your doctor. Call your doctor  if you have symptoms of infection. Get help right away if your belly swells, your cut bleeds a lot, or you have trouble talking or breathing. This information is not intended to replace advice given to you by your health care provider. Make sure you discuss any questions you have with your health care provider. Document Revised: 08/02/2017 Document Reviewed: 08/03/2017 Elsevier Patient Education  2021 [employer].

## 2021-07-14 ENCOUNTER — Telehealth: Payer: Self-pay | Admitting: Physician Assistant

## 2021-07-14 ENCOUNTER — Other Ambulatory Visit: Payer: Self-pay | Admitting: Physician Assistant

## 2021-07-14 DIAGNOSIS — C799 Secondary malignant neoplasm of unspecified site: Secondary | ICD-10-CM

## 2021-07-14 NOTE — Telephone Encounter (Signed)
I called and spoke to Mr. Coltan Spinello to review liver biopsy results. Pathology confirmed metastatic adenocarcinoma. IHC studies are suggestive of colorectal primary but cannot rule out upper GI and pancreaticobiliary primary.   Next steps include endoscopic evaluation to determine primary. We will reach out to patient's gastroenterologist to arrange. We will reschedule patient's follow up with Dr. Lorenso Courier until we review endoscopic procedures.   Patient expressed understanding of the plan provided.

## 2021-07-15 ENCOUNTER — Telehealth: Payer: Self-pay | Admitting: Physician Assistant

## 2021-07-15 NOTE — Telephone Encounter (Signed)
Rescheduled appointments due to Billy [name]'s request, called patient to notify. Voicemail is full, calender will be mailed and will attempt to call again.

## 2021-07-18 ENCOUNTER — Telehealth: Payer: Self-pay

## 2021-07-18 DIAGNOSIS — R933 Abnormal findings on diagnostic imaging of other parts of digestive tract: Secondary | ICD-10-CM

## 2021-07-18 NOTE — Telephone Encounter (Signed)
Patient notified of the recommendations He has been scheduled for tomorrow at 9:00 He verbalized understanding of all verbal instructions. To be clear liquids after midnight, NPO after 7:00, arrive with his driver at 6:88

## 2021-07-18 NOTE — Addendum Note (Signed)
Addended by: Marlon Pel on: 07/18/2021 12:01 PM   Modules accepted: Orders

## 2021-07-18 NOTE — Telephone Encounter (Signed)
-----   Message from Ladene Artist, MD sent at 07/15/2021  4:01 PM EST ----- Regarding: RE: Urgent EGD/Colonoscopy needed Elianne Gubser, please contact the patient to schedule EGD, preferably next week. MS  ----- Message ----- From: Cordelia Poche Sent: 07/15/2021  12:45 PM EST To: Marlon Pel, RN, Milus Banister, MD, # Subject: RE: Urgent EGD/Colonoscopy needed              Update on this. His PET is scheduled for next Friday, 12/15. I wanted to check if the plan is to proceed with endoscopic evaluation as well.   Thanks, Murray Hodgkins  ----- Message ----- From: Milus Banister, MD Sent: 07/14/2021  12:26 PM EST To: Marlon Pel, RN, Ladene Artist, MD, # Subject: RE: Urgent EGD/Colonoscopy needed              To me it looks like there is a mass in the tail of the pancreas.    Murray Hodgkins,  Can you have a more dedicated body imager radiologist review the films and comment. If they agree they should put an official addendum on the CT report. Let us know what they say.    Thanks    ----- Message ----- From: Ladene Artist, MD Sent: 07/14/2021  12:07 PM EST To: Marlon Pel, RN, Milus Banister, MD, # Subject: RE: Urgent EGD/Colonoscopy needed              Hi,  Mr. Simic underwent colonoscopy to the cecum with a good prep in June 2021 and 2 small polyps were removed (1 TA and 1 SSP) so a colon primary seems unlikely. I will review the request for EUS with Drs. Ardis Hughs and [place] who perform EUS. I will schedule EGD unless EGD/EUS is indicated after review by Drs Ardis Hughs and [place].   Thanks,   Norberto Sorenson   ----- Message ----- From: Cordelia Poche Sent: 07/14/2021  10:56 AM EST To: Ladene Artist, MD Subject: Urgent EGD/Colonoscopy needed                  Dr. Fuller Plan,  I saw Mr. Fonder last week for abnormal CT imaging concerning for metastatic disease. He underwent liver biopsy earlier this week that showed adenocarcinoma and IHC studies are learning toward a  colorectal primary although upper GI and pancreaticobiliary origin cannot be excluded.   Can you assist with arranging a STAT EGD/EUS and colonoscopy?  Thanks, Murray Hodgkins

## 2021-07-18 NOTE — Telephone Encounter (Signed)
Left message for patient to call back to discuss EGD this week.  I spoke with his wife and she is going to have him call me back.

## 2021-07-19 ENCOUNTER — Other Ambulatory Visit: Payer: Self-pay

## 2021-07-19 ENCOUNTER — Ambulatory Visit (AMBULATORY_SURGERY_CENTER): Payer: Medicare HMO | Admitting: Gastroenterology

## 2021-07-19 ENCOUNTER — Encounter: Payer: Self-pay | Admitting: Gastroenterology

## 2021-07-19 VITALS — BP 119/69 | HR 51 | Temp 96.8°F | Resp 14 | Ht 73.0 in | Wt 196.0 lb

## 2021-07-19 DIAGNOSIS — C169 Malignant neoplasm of stomach, unspecified: Secondary | ICD-10-CM | POA: Diagnosis not present

## 2021-07-19 DIAGNOSIS — R935 Abnormal findings on diagnostic imaging of other abdominal regions, including retroperitoneum: Secondary | ICD-10-CM

## 2021-07-19 DIAGNOSIS — I2510 Atherosclerotic heart disease of native coronary artery without angina pectoris: Secondary | ICD-10-CM | POA: Diagnosis not present

## 2021-07-19 DIAGNOSIS — K3189 Other diseases of stomach and duodenum: Secondary | ICD-10-CM | POA: Diagnosis not present

## 2021-07-19 DIAGNOSIS — C787 Secondary malignant neoplasm of liver and intrahepatic bile duct: Secondary | ICD-10-CM

## 2021-07-19 DIAGNOSIS — R933 Abnormal findings on diagnostic imaging of other parts of digestive tract: Secondary | ICD-10-CM | POA: Diagnosis not present

## 2021-07-19 DIAGNOSIS — I10 Essential (primary) hypertension: Secondary | ICD-10-CM | POA: Diagnosis not present

## 2021-07-19 DIAGNOSIS — I252 Old myocardial infarction: Secondary | ICD-10-CM | POA: Diagnosis not present

## 2021-07-19 MED ORDER — SODIUM CHLORIDE 0.9 % IV SOLN: 500.0000 mL | Freq: Once | INTRAVENOUS | Status: DC

## 2021-07-19 NOTE — Op Note (Signed)
[facility] Patient Name: Billy [name] Procedure Date: 07/19/2021 9:51 AM MRN: [id number] Endoscopist: Ladene Artist , MD Age: 80 Referring MD:  Date of Birth: [birth year] Gender: Male Account #: [id number] Procedure:                Upper GI endoscopy Indications:              Abnormal CT of the GI tract, Liver metastases Medicines:                Monitored Anesthesia Care Procedure:                Pre-Anesthesia Assessment:                           - Prior to the procedure, a History and Physical                            was performed, and patient medications and                            allergies were reviewed. The patient's tolerance of                            previous anesthesia was also reviewed. The risks                            and benefits of the procedure and the sedation                            options and risks were discussed with the patient.                            All questions were answered, and informed consent                            was obtained. Prior Anticoagulants: The patient has                            taken no previous anticoagulant or antiplatelet                            agents. ASA Grade Assessment: III - A patient with                            severe systemic disease. After reviewing the risks                            and benefits, the patient was deemed in                            satisfactory condition to undergo the procedure.                           After obtaining informed consent, the endoscope was  passed under direct vision. Throughout the                            procedure, the patient's blood pressure, pulse, and                            oxygen saturations were monitored continuously. The                            Endoscope was introduced through the mouth, and                            advanced to the second part of duodenum. The upper                            GI  endoscopy was accomplished without difficulty.                            The patient tolerated the procedure well. Scope In: Scope Out: Findings:                 The examined esophagus was normal.                           A large, fungating, ulcerated, non-circumferential                            mass with no bleeding and no stigmata of recent                            bleeding was found in the gastric fundus. The mass                            measures 3 cm x 4 cm. Biopsies were taken with a                            cold forceps for histology.                           The exam of the stomach was otherwise normal.                           The duodenal bulb and second portion of the                            duodenum were normal. Complications:            No immediate complications. Estimated Blood Loss:     Estimated blood loss was minimal. Impression:               - Normal esophagus.                           - Malignant gastric tumor in the gastric fundus.  Biopsied.                           - Normal duodenal bulb and second portion of the                            duodenum. Recommendation:           - Patient has a contact number available for                            emergencies. The signs and symptoms of potential                            delayed complications were discussed with the                            patient. Return to normal activities tomorrow.                            Written discharge instructions were provided to the                            patient.                           - Resume previous diet.                           - Continue present medications.                           - Await pathology results.                           - Follow up with Oncology. Ladene Artist, MD 07/19/2021 10:14:12 AM This report has been signed electronically.

## 2021-07-19 NOTE — Progress Notes (Signed)
Called to room to assist during endoscopic procedure.  Patient ID and intended procedure confirmed with present staff. Received instructions for my participation in the procedure from the performing physician.  

## 2021-07-19 NOTE — Progress Notes (Signed)
Report given to PACU, vss 

## 2021-07-19 NOTE — Progress Notes (Signed)
See 07/06/2021 H&P, no changes.

## 2021-07-19 NOTE — Patient Instructions (Signed)
YOU HAD AN ENDOSCOPIC PROCEDURE TODAY AT THE [facility]:   Refer to the procedure report that was given to you for any specific questions about what was found during the examination.  If the procedure report does not answer your questions, please call your gastroenterologist to clarify.  If you requested that your care partner not be given the details of your procedure findings, then the procedure report has been included in a sealed envelope for you to review at your convenience later.  YOU SHOULD EXPECT: Some feelings of bloating in the abdomen. Passage of more gas than usual.  Walking can help get rid of the air that was put into your GI tract during the procedure and reduce the bloating. If you had a lower endoscopy (such as a colonoscopy or flexible sigmoidoscopy) you may notice spotting of blood in your stool or on the toilet paper. If you underwent a bowel prep for your procedure, you may not have a normal bowel movement for a few days.  Please Note:  You might notice some irritation and congestion in your nose or some drainage.  This is from the oxygen used during your procedure.  There is no need for concern and it should clear up in a day or so.  SYMPTOMS TO REPORT IMMEDIATELY:    Following upper endoscopy (EGD)  Vomiting of blood or coffee ground material  New chest pain or pain under the shoulder blades  Painful or persistently difficult swallowing  New shortness of breath  Fever of 100F or higher  Black, tarry-looking stools  For urgent or emergent issues, a gastroenterologist can be reached at any hour by calling [phone]. Do not use MyChart messaging for urgent concerns.    DIET:  We do recommend a small meal at first, but then you may proceed to your regular diet.  Drink plenty of fluids but you should avoid alcoholic beverages for 24 hours.  ACTIVITY:  You should plan to take it easy for the rest of today and you should NOT DRIVE or use heavy machinery  until tomorrow (because of the sedation medicines used during the test).    FOLLOW UP: Our staff will call the number listed on your records 48-72 hours following your procedure to check on you and address any questions or concerns that you may have regarding the information given to you following your procedure. If we do not reach you, we will leave a message.  We will attempt to reach you two times.  During this call, we will ask if you have developed any symptoms of COVID 19. If you develop any symptoms (ie: fever, flu-like symptoms, shortness of breath, cough etc.) before then, please call [phone].  If you test positive for Covid 19 in the 2 weeks post procedure, please call and report this information to us.    If any biopsies were taken you will be contacted by phone or by letter within the next 1-3 weeks.  Please call us at [phone] if you have not heard about the biopsies in 3 weeks.    SIGNATURES/CONFIDENTIALITY: You and/or your care partner have signed paperwork which will be entered into your electronic medical record.  These signatures attest to the fact that that the information above on your After Visit Summary has been reviewed and is understood.  Full responsibility of the confidentiality of this discharge information lies with you and/or your care-partner. 

## 2021-07-19 NOTE — Progress Notes (Signed)
0950 Robinul 0.1 mg IV given due large amount of secretions upon assessment.  MD made aware, vss  

## 2021-07-21 ENCOUNTER — Telehealth: Payer: Self-pay | Admitting: Physician Assistant

## 2021-07-21 ENCOUNTER — Inpatient Hospital Stay: Payer: Medicare HMO | Attending: Physician Assistant | Admitting: Hematology

## 2021-07-21 ENCOUNTER — Encounter: Payer: Self-pay | Admitting: Hematology

## 2021-07-21 ENCOUNTER — Telehealth: Payer: Self-pay | Admitting: *Deleted

## 2021-07-21 ENCOUNTER — Other Ambulatory Visit: Payer: Self-pay

## 2021-07-21 ENCOUNTER — Ambulatory Visit: Payer: Medicare HMO | Admitting: Hematology and Oncology

## 2021-07-21 ENCOUNTER — Other Ambulatory Visit: Payer: Medicare HMO

## 2021-07-21 VITALS — BP 139/88 | HR 60 | Temp 97.6°F | Resp 18 | Ht 73.0 in | Wt 194.2 lb

## 2021-07-21 DIAGNOSIS — C169 Malignant neoplasm of stomach, unspecified: Secondary | ICD-10-CM | POA: Insufficient documentation

## 2021-07-21 DIAGNOSIS — Z79899 Other long term (current) drug therapy: Secondary | ICD-10-CM | POA: Insufficient documentation

## 2021-07-21 DIAGNOSIS — C161 Malignant neoplasm of fundus of stomach: Secondary | ICD-10-CM

## 2021-07-21 DIAGNOSIS — Z7982 Long term (current) use of aspirin: Secondary | ICD-10-CM | POA: Diagnosis not present

## 2021-07-21 DIAGNOSIS — C787 Secondary malignant neoplasm of liver and intrahepatic bile duct: Secondary | ICD-10-CM | POA: Diagnosis not present

## 2021-07-21 MED ORDER — TRAMADOL HCL 50 MG PO TABS: 50.0000 mg | ORAL_TABLET | Freq: Four times a day (QID) | ORAL | 0 refills | Status: DC | PRN

## 2021-07-21 MED ORDER — LIDOCAINE-PRILOCAINE 2.5-2.5 % EX CREA: TOPICAL_CREAM | CUTANEOUS | 3 refills | Status: DC

## 2021-07-21 MED ORDER — PROCHLORPERAZINE MALEATE 10 MG PO TABS: 10.0000 mg | ORAL_TABLET | Freq: Four times a day (QID) | ORAL | 1 refills | Status: DC | PRN

## 2021-07-21 MED ORDER — ONDANSETRON HCL 8 MG PO TABS: 8.0000 mg | ORAL_TABLET | Freq: Two times a day (BID) | ORAL | 1 refills | Status: DC | PRN

## 2021-07-21 NOTE — Progress Notes (Signed)
[place]   Telephone:([phone]) [phone] Fax:(336) [phone]   Clinic Follow up Note   Patient Care Team: Biagio Borg, MD as PCP - General (Internal Medicine) Debara Pickett Nadean Corwin, MD as PCP - Cardiology (Cardiology) Lavonna Monarch, MD (Dermatology) Warren Danes, PA-C as Physician Assistant (Dermatology) Truitt Merle, MD as Consulting Physician (Oncology) Royston Bake, RN as Oncology Nurse Navigator (Oncology)  Date of Service:  07/21/2021  CHIEF COMPLAINT: f/u of gastric caner  CURRENT THERAPY: pending first line chemo FOLFOX    ASSESSMENT & PLAN:  Billy [name] is a 80 y.o. male with   1. Metastatic gastric adenocarcinoma to liver and possible lungs, cTxNxM1, stage IV  -presented with epigastric pain. Chest CT 07/01/21 showed multiple hepatic lesions measuring up to 5.9 cm and pathologically enlarged abdominal lymph nodes, and multiple small lung nodules up to 35m. -seen by PA IMurray Hodgkinsand Dr. DLorenso Courieron 07/06/21. Baseline CA 19.9 was elevated at 53,813. -liver biopsy on 07/12/21 showed metastatic adenocarcinoma, most consistent with primary colorectal or up GI -upper endoscopy on 07/19/21 by Dr. SFuller Planshowed a 4 cm gastric fundus mass. Pathology confirmed invasive moderately to poorly differentiated adenocarcinoma. -- I personally reviewed his CT scan images with patient and his wife, and discussed the biopsy results.  -I discussed that his cancer is not curable at this stage due to diffuse liver metastasis, but is treatable.  He is 80year old, but has good performance status and good baseline health, I recommend him to consider systemic treatment with the goal to prolong his life and prevent cancer related symptoms. I discussed the different treatment options with them, including FOLFOX and CAPOX or xeloda alone. I recommend FOLFOX for him.  -Chemotherapy consent: Side effects including but does not not limited to, fatigue, nausea, vomiting, diarrhea, hair loss,  neuropathy, fluid retention, renal and kidney dysfunction, neutropenic fever, needed for blood transfusion, bleeding, were discussed with patient in great detail. He agrees to proceed. -The goal of therapy is palliative. -Due to his advanced age, I will reduce the chemo dose for first cycle to see how he tolerates. -I also discussed the role of immunotherapy and targeted therapy.  We will order HER2, MMR, and PD-L1 on his biopsy sample. -I will order baseline lab work (CBC, CMP, and CEA) to be done at his next visit.  2. Abdominal Pain -he was prescribed tramadol today by PA IMurray Hodgkins He was counseled to avoid NSAID. -He endorses using prilosec, and I advised him to continue.  3. Goal of care discussion, DNR, Social Support -We again discussed the incurable nature of his cancer, and the overall poor prognosis, especially if he does not have good response to chemotherapy or progress on chemo -The patient understands the goal of care is palliative. -they have two children that live here in town. I recommended they keep them in the loop. -he has DNR/DNI in place already   PLAN: -I reviewed his skin findings and the biopsy results -Plan to start first-line chemotherapy FOLFOX in about 2 weeks, after Christmas -Chemo class next week -We will order HER2, MMR, and PD-L1 expression on his biopsy sample  No problem-specific Assessment & Plan notes found for this encounter.   SUMMARY OF ONCOLOGIC HISTORY: Oncology History  Gastric cancer ([facility]  07/01/2021 Imaging   EXAM: CT ANGIOGRAPHY CHEST WITH CONTRAST  IMPRESSION: 1. No evidence of a pulmonary embolism. 2. Findings consistent with neoplastic disease. Multiple liver masses consistent with widespread hepatic metastatic disease. Ill-defined partly imaged  along the porta hepatis suspicious °for a pancreatic malignancy. There also multiple subcentimeter lung nodules consistent with metastatic disease. Recommend follow-up CT of the abdomen  and pelvis with contrast for further assessment °3. No acute findings in the chest. °4. Dilated ascending thoracic aorta to 4.7 cm. Ascending thoracic °aortic aneurysm. Recommend semi-annual imaging followup by CTA or MRA and referral to cardiothoracic surgery if not already obtained. This recommendation follows 2010 ACCF/AHA/AATS/ACR/ASA/SCA/SCAI/SIR/STS/SVM Guidelines for the Diagnosis and Management of Patients With Thoracic Aortic Disease. Circulation. 2010; 121: E266-e369. Aortic aneurysm NOS (ICD10-I71.9) °  °  °07/01/2021 Imaging  ° EXAM: °CT ABDOMEN AND PELVIS WITH CONTRAST ° °IMPRESSION: °1. Numerous hypodense hepatic lesions with dominant lesion in the °inferior right lobe measuring up to 5.9 cm. Findings worrisome for °diffuse metastatic disease. Primary hepatic neoplasm not excluded in the inferior right lobe. °2. Pathologically enlarged periportal, portal caval, peripancreatic °and aortocaval lymph nodes worrisome for metastatic disease. °3. Bilateral inguinal hernias. Left inguinal hernia contains colon. °No bowel obstruction. °4.  Aortic Atherosclerosis (ICD10-I70.0). °  °07/12/2021 Pathology Results  ° FINAL MICROSCOPIC DIAGNOSIS:  ° °A. LEFT LIVER MASS, NEEDLE CORE BIOPSY:  °Metastatic adenocarcinoma.  °Hemosiderosis in the nonneoplastic portion of the liver.  °Please see comment.  ° °Comment: The morphologic features of the neoplasm are most consistent with metastasis from a primary colorectal adenocarcinoma.  However, possibility of primary in the upper GI tract and pancreaticobiliary tree cannot be excluded.  Markers for lung adenocarcinoma are negative.  °  °07/19/2021 Procedure  ° Upper Endoscopy, Dr. Stark ° °Impression: °- Normal esophagus. °- Malignant gastric tumor in the gastric fundus. Biopsied. °- Normal duodenal bulb and second portion of the duodenum. °  °07/19/2021 Pathology Results  ° Diagnosis °Stomach, biopsy, Mass °- INVASIVE MODERATE TO POORLY DIFFERENTIATED ADENOCARCINOMA, °   °07/21/2021 Initial Diagnosis  ° Gastric cancer (HCC) °  ° ° ° °INTERVAL HISTORY:  °Billy [name] is here for a follow up of gastric cancer. He was last seen by PA Irene and Dr. Dorsey on 07/06/21 in consultation. He presents to the clinic accompanied by his wife. °He reports his symptoms began about a month ago as gastric pressure, not relieved by antacids. °  °All other systems were reviewed with the patient and are negative. ° °MEDICAL HISTORY:  °Past Medical History:  °Diagnosis Date  ° Basal cell carcinoma 05/29/2012  ° left thigh-(EXC)  ° Basal cell carcinoma 10/23/2012  ° right side of nose (MOHS)  ° Basal cell carcinoma 12/12/2012  ° nod-irght cheek (MOHS)  ° Basal cell carcinoma 05/26/2015  ° nod-left calf (CX35FU)  ° Basal cell carcinoma 05/26/2015  ° nod-left nose (txpbx)  ° Basal cell carcinoma 06/08/2016  ° nod-above right brow (CX35FU)  ° Basal cell carcinoma 09/12/2016  ° left nose (MOHS)  ° Basal cell carcinoma 01/29/2019  ° nod-right tip of nose (MOHS)  ° Benign neoplasm of colon   ° Finger fracture   ° Hx of colonoscopy   ° Hyperlipidemia   ° Hypertension   ° Internal hemorrhoids without mention of complication   ° Myocardial infarction (HCC)   ° 09/01/2015  ° Plantar fascial fibromatosis   ° Prostatitis   ° Rib fracture   ° Squamous cell carcinoma of skin 07/13/2009  ° RIght lower outer leg  ° Squamous cell carcinoma of skin 02/17/2014  ° in situ-right temple (txpbx)  ° Squamous cell carcinoma of skin 01/24/2018  ° in situ-right chest (txpbx)  ° Squamous cell carcinoma of skin   skin 01/24/2018   in situ0 right forehead (txpbx)   Squamous cell carcinoma of skin     SURGICAL HISTORY: Past Surgical History:  Procedure Laterality Date   cardiac stint  2017   COLONOSCOPY  2016   IR IMAGING GUIDED PORT INSERTION  07/12/2021   IR US GUIDE BX ASP/DRAIN  07/12/2021   POLYPECTOMY     TONSILLECTOMY     VARICOCELECTOMY     Left    I have reviewed the social history and family history with the  patient and they are unchanged from previous note.  ALLERGIES:  is allergic to ofloxacin.  MEDICATIONS:  Current Outpatient Medications  Medication Sig Dispense Refill   ALPRAZolam (XANAX) 0.5 MG tablet Take 1 tablet (0.5 mg total) by mouth 2 (two) times daily as needed for anxiety. 60 tablet 0   aspirin EC 81 MG tablet Take 81 mg by mouth daily.     atorvastatin (LIPITOR) 80 MG tablet Take 1 tablet (80 mg total) by mouth daily at 6 PM. 90 tablet 3   Cholecalciferol (VITAMIN D3 PO) Take 1 tablet by mouth daily.     lisinopril (ZESTRIL) 2.5 MG tablet Take 1 tablet (2.5 mg total) by mouth daily. 90 tablet 3   nitroGLYCERIN (NITROSTAT) 0.4 MG SL tablet Place 1 tablet (0.4 mg total) under the tongue every 5 (five) minutes as needed for chest pain. Max 3 doses. 25 tablet 3   Omega-3 Fatty Acids (FISH OIL) 500 MG CAPS Take 500 mg by mouth daily.     traMADol (ULTRAM) 50 MG tablet Take 1 tablet (50 mg total) by mouth every 6 (six) hours as needed. 30 tablet 0   No current facility-administered medications for this visit.    PHYSICAL EXAMINATION: ECOG PERFORMANCE STATUS: 1 - Symptomatic but completely ambulatory  Vitals:   07/21/21 1555  BP: 139/88  Pulse: 60  Resp: 18  Temp: 97.6 F (36.4 C)  SpO2: 96%   Wt Readings from Last 3 Encounters:  07/21/21 194 lb 3.2 oz (88.1 kg)  07/19/21 196 lb (88.9 kg)  07/07/21 196 lb (88.9 kg)     GENERAL:alert, no distress and comfortable SKIN: skin color, texture, turgor are normal, no rashes or significant lesions EYES: normal, Conjunctiva are pink and non-injected, sclera clear  NECK: supple, thyroid normal size, non-tender, without nodularity LYMPH:  no palpable lymphadenopathy in the cervical, axillary  LUNGS: clear to auscultation and percussion with normal breathing effort HEART: regular rate & rhythm and no murmurs and no lower extremity edema ABDOMEN:abdomen soft, non-tender and normal bowel sounds Musculoskeletal:no cyanosis of digits  and no clubbing  NEURO: alert & oriented x 3 with fluent speech, no focal motor/sensory deficits  LABORATORY DATA:  I have reviewed the data as listed CBC Latest Ref Rng & Units 07/12/2021 07/06/2021 07/01/2021  WBC 4.0 - 10.5 K/uL 10.2 10.1 9.8  Hemoglobin 13.0 - 17.0 g/dL 12.0(L) 13.5 13.5  Hematocrit 39.0 - 52.0 % 36.8(L) 40.6 41.0  Platelets 150 - 400 K/uL 221 249 260     CMP Latest Ref Rng & Units 07/06/2021 07/01/2021 07/28/2020  Glucose 70 - 99 mg/dL 98 118(H) 98  BUN 8 - 23 mg/dL _0 Creatinine 0.61 - 1.24 mg/dL 0.94 0.88 0.95  Sodium 135 - 145 mmol/L 138 137 138  Potassium 3.5 - 5.1 mmol/L 4.7 4.7 4.5  Chloride 98 - 111 mmol/L 105 106 104  CO2 22 - 32 mmol/L _1 Calcium 8.9 - 10.3  8.9 8.9 9.0  °Total Protein 6.5 - 8.1 g/dL 6.9 6.0(L) 6.9  °Total Bilirubin 0.3 - 1.2 mg/dL 0.8 1.2 1.1  °Alkaline Phos 38 - 126 U/L 144(H) 105 57  °AST 15 - 41 U/L 83(H) 64(H) 20  °ALT 0 - 44 U/L 52(H) 29 21  ° ° ° ° °RADIOGRAPHIC STUDIES: °I have personally reviewed the radiological images as listed and agreed with the findings in the report. °No results found.  ° ° °Orders Placed This Encounter  °Procedures  ° CBC with Differential/Platelet  °  Standing Status:   Standing  °  Number of Occurrences:   50  °  Standing Expiration Date:   07/21/2022  ° CEA (IN HOUSE-[facility])  °  Standing Status:   Standing  °  Number of Occurrences:   10  °  Standing Expiration Date:   07/21/2022  ° Comprehensive metabolic panel  °  Standing Status:   Standing  °  Number of Occurrences:   50  °  Standing Expiration Date:   07/21/2022  ° Ambulatory Referral to [facility] Nutrition  °  Referral Priority:   Routine  °  Referral Type:   Consultation  °  Referral Reason:   Specialty Services Required  °  Number of Visits Requested:   1  ° ° °All questions were answered. The patient knows to call the clinic with any problems, questions or concerns. No barriers to learning was detected. °The total time spent in the appointment  was 60 minutes. ° °  ° Yan Feng, MD °07/21/2021  ° °I, Katie Daubenspeck, am acting as scribe for Yan Feng, MD.  ° °I have reviewed the above documentation for accuracy and completeness, and I agree with the above. °  ° ° °

## 2021-07-21 NOTE — Telephone Encounter (Signed)
°  Follow up Call-  Call back number 07/19/2021 01/30/2020  Post procedure Call Back phone  # [phone]  Permission to leave phone message Yes Yes  Some recent data might be hidden     Patient questions:  Message left to call if necessary.

## 2021-07-21 NOTE — Progress Notes (Signed)
START ON PATHWAY REGIMEN - Gastroesophageal     A cycle is every 14 days:     Oxaliplatin      Leucovorin      Fluorouracil      Fluorouracil   **Always confirm dose/schedule in your pharmacy ordering system**  Patient Characteristics: Distant Metastases (cM1/pM1) / Locally Recurrent Disease, Adenocarcinoma - Esophageal, GE Junction, and Gastric, First Line, HER2 Negative/Unknown, PD?L1 Expression CPS < 5/Negative/Unknown, MSS/pMMR or MSI Unknown Histology: Adenocarcinoma Disease Classification: Gastric Therapeutic Status: Distant Metastases (No Additional Staging) Line of Therapy: First Line HER2 Status: Awaiting Test Results PD-L1 Expression Status: Awaiting Test Results Microsatellite/Mismatch Repair Status: Unknown Intent of Therapy: Non-Curative / Palliative Intent, Discussed with Patient

## 2021-07-21 NOTE — Telephone Encounter (Signed)
I called Billy [name] and reviewed pathology report from recent gastric mass biopsy. We will arrange a follow up with Dr. Burr Medico later today at [facility].    Patient reports intermittent episodes of mid abdominal pain that radiates to the back. He takes Motrin last night with improvement of pain. I advised to minimize NSAID use to decrease risk of gastric bleeding in the setting of gastric cancer. I sent a prescription of Tramadol 50 mg q 6 hours PRN. Recommended to take stool softeners to minimize constipation.

## 2021-07-21 NOTE — Progress Notes (Signed)
I met with Billy [name] and his wife after prior to his consultation with Dr Burr Medico.  I explained my role as a nurse navigator and provided my contact information.I explained the services provided at [facility] and provided written information.  I explained the alight grant and let  him know one of the financial advisors will reach out to  him at the time of his chemo education class. I briefly reviewed common side effects associated with the recommended chemotherapy regimen.  I told him that he will be scheduled for chemotherapy education class prior to receiving chemotherapy.  I told them our schedulers will call him with those appts. All questions were answered.  He verbalized understanding.

## 2021-07-21 NOTE — Progress Notes (Signed)
I introduced myself to Mr Matters via phone.  I offered him an appt with Dr Burr Medico today at 1600 and he accepted.  I asked that he arrive 15 minutes before his appt for registration purposes.  All questions were answered.  He verbalized understanding.

## 2021-07-22 ENCOUNTER — Ambulatory Visit (HOSPITAL_COMMUNITY): Payer: Medicare HMO

## 2021-07-26 ENCOUNTER — Telehealth: Payer: Self-pay | Admitting: Hematology

## 2021-07-26 ENCOUNTER — Ambulatory Visit: Payer: Medicare HMO | Admitting: Physician Assistant

## 2021-07-26 ENCOUNTER — Other Ambulatory Visit: Payer: Medicare HMO

## 2021-07-26 NOTE — Telephone Encounter (Signed)
Scheduled per sch msg. Called and spoke with patient. Confirmed appt  

## 2021-07-27 ENCOUNTER — Other Ambulatory Visit: Payer: Self-pay

## 2021-07-27 DIAGNOSIS — Z95828 Presence of other vascular implants and grafts: Secondary | ICD-10-CM

## 2021-07-27 DIAGNOSIS — C161 Malignant neoplasm of fundus of stomach: Secondary | ICD-10-CM

## 2021-07-27 MED ORDER — PROCHLORPERAZINE MALEATE 10 MG PO TABS: 10.0000 mg | ORAL_TABLET | Freq: Four times a day (QID) | ORAL | 3 refills | Status: AC | PRN

## 2021-07-27 MED ORDER — ONDANSETRON HCL 8 MG PO TABS
8.0000 mg | ORAL_TABLET | Freq: Three times a day (TID) | ORAL | 3 refills | Status: AC | PRN
Start: 2021-07-27 — End: ?

## 2021-07-27 MED ORDER — LIDOCAINE-PRILOCAINE 2.5-2.5 % EX CREA
1.0000 | TOPICAL_CREAM | CUTANEOUS | 2 refills | Status: AC | PRN
Start: 2021-07-27 — End: ?

## 2021-07-27 MED ORDER — LIDOCAINE-PRILOCAINE 2.5-2.5 % EX CREA
1.0000 | TOPICAL_CREAM | CUTANEOUS | 2 refills | Status: DC | PRN
Start: 2021-07-27 — End: 2021-07-27

## 2021-07-27 NOTE — Progress Notes (Signed)
Mr Spreen left vm stating prescriptions for zofran and compazine sent to a pharmacy he does not use.  I cancelled those prescriptions and escribed to his [facility] pharmacy.  I called [facility] mail order pharmacy and cancelled these prescriptions.

## 2021-07-28 ENCOUNTER — Telehealth: Payer: Self-pay | Admitting: Hematology

## 2021-07-28 NOTE — Telephone Encounter (Signed)
Sch per 12/16 los, left msg

## 2021-07-29 ENCOUNTER — Ambulatory Visit: Payer: Medicare HMO

## 2021-08-02 ENCOUNTER — Encounter: Payer: PPO | Admitting: Internal Medicine

## 2021-08-03 ENCOUNTER — Other Ambulatory Visit: Payer: Self-pay | Admitting: Hematology

## 2021-08-03 MED ORDER — HYDROCODONE-ACETAMINOPHEN 5-325 MG PO TABS: 1.0000 | ORAL_TABLET | Freq: Four times a day (QID) | ORAL | 0 refills | Status: DC | PRN

## 2021-08-03 NOTE — Progress Notes (Signed)
Pharmacist Chemotherapy Monitoring - Initial Assessment    Anticipated start date: 08/11/20   The following has been reviewed per standard work regarding the patient's treatment regimen: The patient's diagnosis, treatment plan and drug doses, and organ/hematologic function Lab orders and baseline tests specific to treatment regimen  The treatment plan start date, drug sequencing, and pre-medications Prior authorization status  Patient's documented medication list, including drug-drug interaction screen and prescriptions for anti-emetics and supportive care specific to the treatment regimen The drug concentrations, fluid compatibility, administration routes, and timing of the medications to be used The patient's access for treatment and lifetime cumulative dose history, if applicable  The patient's medication allergies and previous infusion related reactions, if applicable   Changes made to treatment plan:  treatment plan date  Follow up needed:  N/A   Kennith Center, Pharm.D., CPP 08/03/2021@2 :36 PM

## 2021-08-04 LAB — SURGICAL PATHOLOGY

## 2021-08-05 ENCOUNTER — Inpatient Hospital Stay: Payer: Medicare HMO

## 2021-08-05 ENCOUNTER — Other Ambulatory Visit: Payer: Self-pay

## 2021-08-05 ENCOUNTER — Other Ambulatory Visit: Payer: Self-pay | Admitting: Hematology

## 2021-08-05 ENCOUNTER — Telehealth: Payer: Self-pay

## 2021-08-05 MED ORDER — MIRTAZAPINE 7.5 MG PO TABS: 7.5000 mg | ORAL_TABLET | Freq: Every day | ORAL | 0 refills | Status: DC

## 2021-08-05 MED ORDER — HYDROCODONE-ACETAMINOPHEN 5-325 MG PO TABS: 1.0000 | ORAL_TABLET | ORAL | 0 refills | Status: DC | PRN

## 2021-08-05 NOTE — Telephone Encounter (Signed)
Billy [name] was at chemotherapy Education and stated that his pain was better with the Hydrocodone tablet but the relief with 1 pill was lasting 4-5 hours not the 6 hours as prescribed. He was was using 5-6 tabs in 24 hours. He also stated that he is not sleeping well.  He wake up and his mined just races with with different thoughts about his mortality since recent diagnosis. He take the 0.5 mg tab of xanax at hs if he takes it.   Reviewed above with Dr. Burr Medico who then spoke directly with Billy [name] and his wife. She sent in a prescription for more Hydrocodone/ acetaminophen and increased the frequency to every 4 hours prn. Dr. Burr Medico also sent in Remeron for sleep, depression , and increase appetite.  He will take 1 capful of miralax bid in addition to 2 colace and 2 senna tablets bid to keep bowels moving.  Dr. Burr Medico will follow up with Billy. [name] at visit on Wednesday 08-10-21.

## 2021-08-08 ENCOUNTER — Other Ambulatory Visit: Payer: Self-pay | Admitting: Hematology

## 2021-08-09 ENCOUNTER — Telehealth: Payer: Self-pay | Admitting: Dietician

## 2021-08-09 ENCOUNTER — Inpatient Hospital Stay: Payer: Medicare HMO | Attending: Physician Assistant | Admitting: Dietician

## 2021-08-09 ENCOUNTER — Other Ambulatory Visit: Payer: Self-pay

## 2021-08-09 ENCOUNTER — Encounter: Payer: Self-pay | Admitting: Hematology

## 2021-08-09 DIAGNOSIS — Z85828 Personal history of other malignant neoplasm of skin: Secondary | ICD-10-CM | POA: Insufficient documentation

## 2021-08-09 DIAGNOSIS — Z66 Do not resuscitate: Secondary | ICD-10-CM | POA: Insufficient documentation

## 2021-08-09 DIAGNOSIS — Z79899 Other long term (current) drug therapy: Secondary | ICD-10-CM | POA: Insufficient documentation

## 2021-08-09 DIAGNOSIS — Z7982 Long term (current) use of aspirin: Secondary | ICD-10-CM | POA: Insufficient documentation

## 2021-08-09 DIAGNOSIS — C787 Secondary malignant neoplasm of liver and intrahepatic bile duct: Secondary | ICD-10-CM | POA: Insufficient documentation

## 2021-08-09 DIAGNOSIS — I712 Thoracic aortic aneurysm, without rupture, unspecified: Secondary | ICD-10-CM | POA: Insufficient documentation

## 2021-08-09 DIAGNOSIS — Z5111 Encounter for antineoplastic chemotherapy: Secondary | ICD-10-CM | POA: Insufficient documentation

## 2021-08-09 DIAGNOSIS — F419 Anxiety disorder, unspecified: Secondary | ICD-10-CM | POA: Insufficient documentation

## 2021-08-09 DIAGNOSIS — I252 Old myocardial infarction: Secondary | ICD-10-CM | POA: Insufficient documentation

## 2021-08-09 DIAGNOSIS — Z86010 Personal history of colonic polyps: Secondary | ICD-10-CM | POA: Insufficient documentation

## 2021-08-09 DIAGNOSIS — R5383 Other fatigue: Secondary | ICD-10-CM | POA: Insufficient documentation

## 2021-08-09 DIAGNOSIS — G4700 Insomnia, unspecified: Secondary | ICD-10-CM | POA: Insufficient documentation

## 2021-08-09 DIAGNOSIS — E785 Hyperlipidemia, unspecified: Secondary | ICD-10-CM | POA: Insufficient documentation

## 2021-08-09 DIAGNOSIS — C169 Malignant neoplasm of stomach, unspecified: Secondary | ICD-10-CM | POA: Insufficient documentation

## 2021-08-09 DIAGNOSIS — Z8719 Personal history of other diseases of the digestive system: Secondary | ICD-10-CM | POA: Insufficient documentation

## 2021-08-09 DIAGNOSIS — R109 Unspecified abdominal pain: Secondary | ICD-10-CM | POA: Insufficient documentation

## 2021-08-09 DIAGNOSIS — I10 Essential (primary) hypertension: Secondary | ICD-10-CM | POA: Insufficient documentation

## 2021-08-09 NOTE — Progress Notes (Signed)
Called pt to introduce myself as his Arboriculturist.  Unfortunately there aren't any foundations offering copay assistance for his Dx and the type of ins he has.  I informed him of the [employer], went over what it covers and gave him the income requirement.  Pt declined the grant at this time so I requested the registration staff give him my business card in case he changes his mind and for any questions or concerns he may have in the future.

## 2021-08-09 NOTE — Telephone Encounter (Signed)
Nutrition Assessment   Reason for Assessment: Provider request   ASSESSMENT: 81 year old male with newly diagnosed gastric cancer metastatic to liver and possible lung. He is pending start of palliative FOLFOX, first chemotherapy scheduled 1/05. Patient is followed by Dr. Burr Medico.   Past medical history includes skin cancer, HLD, HTN, vit D deficiency, gout, anxiety, plantar fascitis   Spoke with patient and wife via telephone. Patient reports eating less than usual but "fair amount." Patient reports his wife is a great cook, prepares home made soups, pastas, stews. Patient reports back pain, he is taking hydrocodone every 5 hours. He started medication to help him sleep. Patient reports resting well last night, but pain started again after getting up. Patient reports decreased bowel movements, had 2 yesterday but not "as substantial" as normal. He is taking stool softener and miralax twice daily. He previously drank prune juice when feeling constipated. This "worked like a Nutritional therapist" he is asking if this is okay to drink. He is also asking about using saline or liver cod oil for constipation. Patient is drinking minimum 8 glasses of water daily. Patient is asking about what side effects to expect with treatment.    Nutrition Focused Physical Exam: unable to complete at this time   Medications: D3, xanax, fish oil, compazine, zofran, remeron, zestril   Labs: 12/6 labs reviewed   Anthropometrics:   Height: 6'1" Weight: 194 lb 3.2 oz (12/15) UBW: 201 lb 8 oz (10/19/20) BMI: 25.62    NUTRITION DIAGNOSIS: Food and nutrition related knowledge deficit related to cancer and associated treatments as evidenced by no prior need for related nutrition information     INTERVENTION:  Educated on importance of adequate calories and protein to maintain weights, strength Encouraged small frequent meals and snacks - will provide handout Discussed low residue diet, foods to include and foods to  limit/avoid - will provide handout Suggested pt have warm cereal/beverage/resume 4 oz prune juice to stimulant bowels in morning, continue bowel regimen per MD Discussed cold sensitivity side effects of chemotherapy treatment - will provide handout  Educated on support services available at [facility] - will provide January calendar of events Handouts and contact information left in envelope for pt to pick up on 1/04 at registration desk     MONITORING, EVALUATION, GOAL: Patient will tolerate adequate calories and protein to minimize weight loss   Next Visit: Thursday January 19 during infusion

## 2021-08-10 ENCOUNTER — Inpatient Hospital Stay: Payer: Medicare HMO

## 2021-08-10 ENCOUNTER — Encounter: Payer: Self-pay | Admitting: Hematology

## 2021-08-10 ENCOUNTER — Other Ambulatory Visit: Payer: Self-pay

## 2021-08-10 ENCOUNTER — Telehealth: Payer: Self-pay

## 2021-08-10 ENCOUNTER — Inpatient Hospital Stay: Payer: Medicare HMO | Admitting: Hematology

## 2021-08-10 VITALS — BP 124/90 | HR 77 | Temp 97.4°F | Resp 18 | Ht 73.0 in | Wt 189.6 lb

## 2021-08-10 DIAGNOSIS — R69 Illness, unspecified: Secondary | ICD-10-CM | POA: Diagnosis not present

## 2021-08-10 DIAGNOSIS — Z79899 Other long term (current) drug therapy: Secondary | ICD-10-CM | POA: Diagnosis not present

## 2021-08-10 DIAGNOSIS — R5383 Other fatigue: Secondary | ICD-10-CM | POA: Diagnosis not present

## 2021-08-10 DIAGNOSIS — C161 Malignant neoplasm of fundus of stomach: Secondary | ICD-10-CM

## 2021-08-10 DIAGNOSIS — F419 Anxiety disorder, unspecified: Secondary | ICD-10-CM | POA: Diagnosis not present

## 2021-08-10 DIAGNOSIS — R109 Unspecified abdominal pain: Secondary | ICD-10-CM | POA: Diagnosis not present

## 2021-08-10 DIAGNOSIS — Z66 Do not resuscitate: Secondary | ICD-10-CM | POA: Diagnosis not present

## 2021-08-10 DIAGNOSIS — Z7982 Long term (current) use of aspirin: Secondary | ICD-10-CM | POA: Diagnosis not present

## 2021-08-10 DIAGNOSIS — I252 Old myocardial infarction: Secondary | ICD-10-CM | POA: Diagnosis not present

## 2021-08-10 DIAGNOSIS — Z85828 Personal history of other malignant neoplasm of skin: Secondary | ICD-10-CM | POA: Diagnosis not present

## 2021-08-10 DIAGNOSIS — I712 Thoracic aortic aneurysm, without rupture, unspecified: Secondary | ICD-10-CM | POA: Diagnosis not present

## 2021-08-10 DIAGNOSIS — Z86010 Personal history of colonic polyps: Secondary | ICD-10-CM | POA: Diagnosis not present

## 2021-08-10 DIAGNOSIS — C787 Secondary malignant neoplasm of liver and intrahepatic bile duct: Secondary | ICD-10-CM | POA: Diagnosis not present

## 2021-08-10 DIAGNOSIS — Z8719 Personal history of other diseases of the digestive system: Secondary | ICD-10-CM | POA: Diagnosis not present

## 2021-08-10 DIAGNOSIS — G4700 Insomnia, unspecified: Secondary | ICD-10-CM | POA: Diagnosis not present

## 2021-08-10 DIAGNOSIS — E785 Hyperlipidemia, unspecified: Secondary | ICD-10-CM | POA: Diagnosis not present

## 2021-08-10 DIAGNOSIS — C169 Malignant neoplasm of stomach, unspecified: Secondary | ICD-10-CM | POA: Diagnosis not present

## 2021-08-10 DIAGNOSIS — Z5111 Encounter for antineoplastic chemotherapy: Secondary | ICD-10-CM | POA: Diagnosis present

## 2021-08-10 DIAGNOSIS — I10 Essential (primary) hypertension: Secondary | ICD-10-CM | POA: Diagnosis not present

## 2021-08-10 DIAGNOSIS — Z95828 Presence of other vascular implants and grafts: Secondary | ICD-10-CM

## 2021-08-10 LAB — COMPREHENSIVE METABOLIC PANEL
ALT: 117 U/L — ABNORMAL HIGH (ref 0–44)
AST: 200 U/L — ABNORMAL HIGH (ref 15–41)
Albumin: 3.4 g/dL — ABNORMAL LOW (ref 3.5–5.0)
Alkaline Phosphatase: 567 U/L — ABNORMAL HIGH (ref 38–126)
Anion gap: 10 (ref 5–15)
BUN: 19 mg/dL (ref 8–23)
CO2: 25 mmol/L (ref 22–32)
Calcium: 8.8 mg/dL — ABNORMAL LOW (ref 8.9–10.3)
Chloride: 100 mmol/L (ref 98–111)
Creatinine, Ser: 0.96 mg/dL (ref 0.61–1.24)
GFR, Estimated: >60 mL/min (ref >60)
Glucose, Bld: 108 mg/dL — ABNORMAL HIGH (ref 70–99)
Potassium: 4.3 mmol/L (ref 3.5–5.1)
Sodium: 135 mmol/L (ref 135–145)
Total Bilirubin: 1 mg/dL (ref 0.3–1.2)
Total Protein: 6.7 g/dL (ref 6.5–8.1)

## 2021-08-10 LAB — CEA (IN HOUSE-CHCC): CEA (CHCC-In House): 1626.33 ng/mL — ABNORMAL HIGH (ref 0.00–5.00)

## 2021-08-10 LAB — CBC WITH DIFFERENTIAL/PLATELET
Abs Immature Granulocytes: 0.06 10*3/uL (ref 0.00–0.07)
Basophils Absolute: 0.1 10*3/uL (ref 0.0–0.1)
Basophils Relative: 1 %
Eosinophils Absolute: 0.1 10*3/uL (ref 0.0–0.5)
Eosinophils Relative: 1 %
HCT: 34.3 % — ABNORMAL LOW (ref 39.0–52.0)
Hemoglobin: 11.2 g/dL — ABNORMAL LOW (ref 13.0–17.0)
Immature Granulocytes: 0 %
Lymphocytes Relative: 12 %
Lymphs Abs: 1.8 10*3/uL (ref 0.7–4.0)
MCH: 30 pg (ref 26.0–34.0)
MCHC: 32.7 g/dL (ref 30.0–36.0)
MCV: 92 fL (ref 80.0–100.0)
Monocytes Absolute: 1.9 10*3/uL — ABNORMAL HIGH (ref 0.1–1.0)
Monocytes Relative: 12 %
Neutro Abs: 11 10*3/uL — ABNORMAL HIGH (ref 1.7–7.7)
Neutrophils Relative %: 74 %
Platelets: 297 10*3/uL (ref 150–400)
RBC: 3.73 MIL/uL — ABNORMAL LOW (ref 4.22–5.81)
RDW: 12.9 % (ref 11.5–15.5)
WBC: 14.9 10*3/uL — ABNORMAL HIGH (ref 4.0–10.5)
nRBC: 0 % (ref 0.0–0.2)

## 2021-08-10 MED ORDER — SODIUM CHLORIDE 0.9% FLUSH
10.0000 mL | INTRAVENOUS | Status: DC | PRN
  Administered 2021-08-10: 10 mL via INTRAVENOUS

## 2021-08-10 MED FILL — Dexamethasone Sodium Phosphate Inj 100 MG/10ML: INTRAMUSCULAR | Qty: 1 | Status: AC

## 2021-08-10 NOTE — Progress Notes (Addendum)
[name]   [phone] [phone]   Clinic Follow up Note   Patient Care Team: Biagio Borg, MD as PCP - General (Internal Medicine) Debara Pickett Nadean Corwin, MD as PCP - Cardiology (Cardiology) Lavonna Monarch, MD (Dermatology) Warren Danes, PA-C as Physician Assistant (Dermatology) Truitt Merle, MD as Consulting Physician (Oncology) Royston Bake, RN as Oncology Nurse Navigator (Oncology)  Date of Service:  08/10/2021  CHIEF COMPLAINT: f/u of metastatic gastric cancer  CURRENT THERAPY:  FOLFOX, q2weeks, starting 08/11/21  ASSESSMENT & PLAN:  Billy [name] is a 81 y.o. male with   1. Metastatic gastric adenocarcinoma to liver and possible lungs, cTxNxM1, stage IV,MMR normal, HER2 (+) -presented with epigastric pain. CT CAP on 07/01/21 showed multiple hepatic lesions measuring up to 5.9 cm and pathologically enlarged abdominal lymph nodes, and multiple small lung nodules up to 76m. -liver biopsy on 07/12/21 showed metastatic adenocarcinoma, most consistent with primary colorectal or up GI -upper endoscopy on 07/19/21 by Dr. SFuller Planshowed a 4 cm gastric fundus mass. Pathology confirmed invasive moderately to poorly differentiated adenocarcinoma. Additional testing showed Her2+ and MMR normal. -The recommendation is for FOLFOX, given his good performance status. -I reviewed the additional tests with them today. Given his Her2 positivity, I recommend adding trastuzumab and Keytruda to his treatment, based on NCCN guideline.  Benefit and potential side effects, especially skin rash, diarrhea, reversible cardiomyopathy, pneumonitis, thyroid and other endocrine disorder, colitis, arthralgia, etc., were discussed with him in details, he agrees to proceed. -We will obtain echocardiogram next week before first treatment -labs reviewed, he will proceed with FOLFOX tomorrow as scheduled. We will plan to add trastuzumab and pertuzumab in second week -To synchronize  his treatment, I plan to change FOLFOX to CBarbadosox after 2 cycles, so his treatment will be every 3 weeks    2. Abdominal Pain -Tramadol was not adequate for his pain control, I changed to hydrocodone, he is taking 5 to 6 tablets a day, pain is controlled -Since he is starting chemotherapy, I will change hydrocodone to oxycodone next week to avoid excessive Tylenol -He endorses using prilosec, and I advised him to continue.   3. Goal of care discussion, partial DNR, Social Support -We again discussed the incurable nature of his cancer, and the overall poor prognosis, especially if he does not have good response to chemotherapy or progress on chemo -The patient understands the goal of care is palliative. -they have two children that live here in town. I recommended they keep them in the loop. -he has a living well, I made a copy today.  He does not want to be on life support if he is terminal.  We again discussed the code that is in detail, at this point, we will keep him on full code, he is willing to change DNR when his cancer progress further and he has no or limited treatment options    4. Anxiety and insomnia  -he has xanax as needed  -I called in mirtazapine last week, his insomnia has improved.   PLAN: -proceed with C1 FOLFOX tomorrow, 08/11/21 at reduced dose  -lab, flush, f/u, and C2 FOLFOX on 08/25/21 as scheduled  -will plan to add trastuzumab and Keytruda on January 12, continue every 3 weeks  -will see nutrition during infusion   No problem-specific Assessment & Plan notes found for this encounter.   SUMMARY OF ONCOLOGIC HISTORY: Oncology History  [place]  [place] Imaging   EXAM: CT  ANGIOGRAPHY CHEST WITH CONTRAST  IMPRESSION: 1. No evidence of a pulmonary embolism. 2. Findings consistent with neoplastic disease. Multiple liver masses consistent with widespread hepatic metastatic disease. Ill-defined partly imaged mass along the porta hepatis  suspicious for a pancreatic malignancy. There also multiple subcentimeter lung nodules consistent with metastatic disease. Recommend follow-up CT of the abdomen and pelvis with contrast for further assessment 3. No acute findings in the chest. 4. Dilated ascending thoracic aorta to 4.7 cm. Ascending thoracic aortic aneurysm. Recommend semi-annual imaging followup by CTA or MRA and referral to cardiothoracic surgery if not already obtained. This recommendation follows 2010 ACCF/AHA/AATS/ACR/ASA/SCA/SCAI/SIR/STS/SVM Guidelines for the Diagnosis and Management of Patients With Thoracic Aortic Disease. Circulation. 2010; 121: Z610-R604. Aortic aneurysm NOS (ICD10-I71.9)     07/01/2021 Imaging   EXAM: CT ABDOMEN AND PELVIS WITH CONTRAST  IMPRESSION: 1. Numerous hypodense hepatic lesions with dominant lesion in the inferior right lobe measuring up to 5.9 cm. Findings worrisome for diffuse metastatic disease. Primary hepatic neoplasm not excluded in the inferior right lobe. 2. Pathologically enlarged periportal, portal caval, peripancreatic and aortocaval lymph nodes worrisome for metastatic disease. 3. Bilateral inguinal hernias. Left inguinal hernia contains colon. No bowel obstruction. 4.  Aortic Atherosclerosis (ICD10-I70.0).   07/12/2021 Pathology Results   FINAL MICROSCOPIC DIAGNOSIS:   A. LEFT LIVER MASS, NEEDLE CORE BIOPSY:  Metastatic adenocarcinoma.  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   Comment: The morphologic features of the neoplasm are most consistent with metastasis from a primary colorectal adenocarcinoma.  However, possibility of primary in the upper GI tract and pancreaticobiliary tree cannot be excluded.  Markers for lung adenocarcinoma are negative.   ADDENDUM:  HER2 by immunohistochemistry is POSITIVE (3+).  ADDENDUM:  Mismatch Repair Protein (IHC)  SUMMARY INTERPRETATION: NORMAL   07/19/2021 Procedure   Upper Endoscopy, Dr.  Fuller Plan  Impression: - Normal esophagus. - Malignant gastric tumor in the gastric fundus. Biopsied. - Normal duodenal bulb and second portion of the duodenum.   07/19/2021 Pathology Results   Diagnosis Stomach, biopsy, Mass - INVASIVE MODERATE TO POORLY DIFFERENTIATED ADENOCARCINOMA,   07/21/2021 Initial Diagnosis   Gastric cancer ([place])   08/11/2021 -  Chemotherapy   Patient is on Treatment Plan : GASTROESOPHAGEAL FOLFOX q14d x 6 cycles     08/18/2021 -  Chemotherapy   Patient is on Treatment Plan : BREAST Trastuzumab + Pertuzumab q21d        INTERVAL HISTORY:  Billy [name] is here for a follow up of metastatic gastric cancer. He was last seen by me on 07/21/21. He presents to the clinic accompanied by his daughter.   All other systems were reviewed with the patient and are negative.  MEDICAL HISTORY:  Past Medical History:  Diagnosis Date   Basal cell carcinoma 05/29/2012   left thigh-(EXC)   Basal cell carcinoma 10/23/2012   right side of nose (MOHS)   Basal cell carcinoma 12/12/2012   nod-irght cheek (MOHS)   Basal cell carcinoma 05/26/2015   nod-left calf (CX35FU)   Basal cell carcinoma 05/26/2015   nod-left nose (txpbx)   Basal cell carcinoma 06/08/2016   nod-above right brow (CX35FU)   Basal cell carcinoma 09/12/2016   left nose (MOHS)   Basal cell carcinoma 01/29/2019   nod-right tip of nose (MOHS)   Benign neoplasm of colon    Finger fracture    Hx of colonoscopy    Hyperlipidemia    Hypertension    Internal hemorrhoids without mention of complication    Myocardial  infarction ([place])    09/01/2015   Plantar fascial fibromatosis    Prostatitis    Rib fracture    Squamous cell carcinoma of skin 07/13/2009   RIght lower outer leg   Squamous cell carcinoma of skin 02/17/2014   in situ-right temple (txpbx)   Squamous cell carcinoma of skin 01/24/2018   in situ-right chest (txpbx)   Squamous cell carcinoma of skin 01/24/2018   in situ0 right forehead  (txpbx)   Squamous cell carcinoma of skin     SURGICAL HISTORY: Past Surgical History:  Procedure Laterality Date   cardiac stint  2017   COLONOSCOPY  2016   IR IMAGING GUIDED PORT INSERTION  07/12/2021   IR US GUIDE BX ASP/DRAIN  07/12/2021   POLYPECTOMY     TONSILLECTOMY     VARICOCELECTOMY     Left    I have reviewed the social history and family history with the patient and they are unchanged from previous note.  ALLERGIES:  is allergic to ofloxacin.  MEDICATIONS:  Current Outpatient Medications  Medication Sig Dispense Refill   ALPRAZolam (XANAX) 0.5 MG tablet Take 1 tablet (0.5 mg total) by mouth 2 (two) times daily as needed for anxiety. 60 tablet 0   aspirin EC 81 MG tablet Take 81 mg by mouth daily.     atorvastatin (LIPITOR) 80 MG tablet Take 1 tablet (80 mg total) by mouth daily at 6 PM. 90 tablet 3   Cholecalciferol (VITAMIN D3 PO) Take 1 tablet by mouth daily.     HYDROcodone-acetaminophen (NORCO) 5-325 MG tablet Take 1 tablet by mouth every 4 (four) hours as needed for moderate pain. 60 tablet 0   lidocaine-prilocaine (EMLA) cream Apply 1 application topically as needed. Apply 0.33gm to PortaCath site 30 to 60 mins prior to PortaCath access. 30 g 2   lisinopril (ZESTRIL) 2.5 MG tablet Take 1 tablet (2.5 mg total) by mouth daily. 90 tablet 3   mirtazapine (REMERON) 7.5 MG tablet Take 1 tablet (7.5 mg total) by mouth at bedtime. 30 tablet 0   nitroGLYCERIN (NITROSTAT) 0.4 MG SL tablet Place 1 tablet (0.4 mg total) under the tongue every 5 (five) minutes as needed for chest pain. Max 3 doses. 25 tablet 3   Omega-3 Fatty Acids (FISH OIL) 500 MG CAPS Take 500 mg by mouth daily.     ondansetron (ZOFRAN) 8 MG tablet Take 1 tablet (8 mg total) by mouth every 8 (eight) hours as needed for nausea or vomiting. 30 tablet 3   prochlorperazine (COMPAZINE) 10 MG tablet Take 1 tablet (10 mg total) by mouth every 6 (six) hours as needed for nausea or vomiting. 30 tablet 3   traMADol  (ULTRAM) 50 MG tablet Take 1 tablet (50 mg total) by mouth every 6 (six) hours as needed. 30 tablet 0   No current facility-administered medications for this visit.    PHYSICAL EXAMINATION: ECOG PERFORMANCE STATUS: 2 - Symptomatic, <50% confined to bed  Vitals:   08/10/21 1218  BP: 124/90  Pulse: 77  Resp: 18  Temp: (!) 97.4 F (36.3 C)  SpO2: 96%   Wt Readings from Last 3 Encounters:  08/10/21 189 lb 9.6 oz (86 kg)  07/21/21 194 lb 3.2 oz (88.1 kg)  07/19/21 196 lb (88.9 kg)     GENERAL:alert, no distress and comfortable SKIN: skin color normal, no rashes or significant lesions EYES: normal, Conjunctiva are pink and non-injected, sclera clear  NEURO: alert & oriented x 3 with fluent speech  LABORATORY DATA:  I have reviewed the data as listed CBC Latest Ref Rng & Units 08/10/2021 07/12/2021 07/06/2021  WBC 4.0 - 10.5 K/uL 14.9(H) 10.2 10.1  Hemoglobin 13.0 - 17.0 g/dL 11.2(L) 12.0(L) 13.5  Hematocrit 39.0 - 52.0 % 34.3(L) 36.8(L) 40.6  Platelets 150 - 400 K/uL 297 221 249     CMP Latest Ref Rng & Units 08/10/2021 07/06/2021 07/01/2021  Glucose 70 - 99 mg/dL 108(H) 98 118(H)  BUN 8 - 23 mg/dL _0 Creatinine 0.61 - 1.24 mg/dL 0.96 0.94 0.88  Sodium 135 - 145 mmol/L 135 138 137  Potassium 3.5 - 5.1 mmol/L 4.3 4.7 4.7  Chloride 98 - 111 mmol/L 100 105 106  CO2 22 - 32 mmol/L _1 Calcium 8.9 - 10.3 mg/dL 8.8(L) 8.9 8.9  Total Protein 6.5 - 8.1 g/dL 6.7 6.9 6.0(L)  Total Bilirubin 0.3 - 1.2 mg/dL 1.0 0.8 1.2  Alkaline Phos 38 - 126 U/L 567(H) 144(H) 105  AST 15 - 41 U/L 200(H) 83(H) 64(H)  ALT 0 - 44 U/L 117(H) 52(H) 29      RADIOGRAPHIC STUDIES: I have personally reviewed the radiological images as listed and agreed with the findings in the report. No results found.    Orders Placed This Encounter  Procedures   ECHOCARDIOGRAM COMPLETE    Standing Status:   Future    Standing Expiration Date:   08/10/2022    Order Specific Question:   Where should  this test be performed    Answer:   [place]    Order Specific Question:   Perflutren DEFINITY (image enhancing agent) should be administered unless hypersensitivity or allergy exist    Answer:   Administer Perflutren    Order Specific Question:   Is a special reader required? (athlete or structural heart)    Answer:   No    Order Specific Question:   Reason for exam-Echo    Answer:   Chemo  Z09   All questions were answered. The patient knows to call the clinic with any problems, questions or concerns. No barriers to learning was detected. The total time spent in the appointment was 40 minutes.     Truitt Merle, MD 08/10/2021   I, Wilburn Mylar, am acting as scribe for Truitt Merle, MD.   I have reviewed the above documentation for accuracy and completeness, and I agree with the above.

## 2021-08-10 NOTE — Telephone Encounter (Signed)
[facility] called from lab reporting critical lab value.  Pt's AST 200 today.  Notified Dr. Burr Medico of critical lab value.

## 2021-08-10 NOTE — Progress Notes (Signed)
Pt called, he changed his mind about the [employer] and would like to apply so he will bring his proof of income on 08/11/21.  If approved I will give him an expense sheet.

## 2021-08-11 ENCOUNTER — Encounter: Payer: Self-pay | Admitting: Hematology

## 2021-08-11 ENCOUNTER — Other Ambulatory Visit: Payer: Self-pay | Admitting: Hematology

## 2021-08-11 ENCOUNTER — Inpatient Hospital Stay: Payer: Medicare HMO

## 2021-08-11 VITALS — BP 116/69 | HR 78 | Temp 98.4°F | Resp 18

## 2021-08-11 DIAGNOSIS — R69 Illness, unspecified: Secondary | ICD-10-CM | POA: Diagnosis not present

## 2021-08-11 DIAGNOSIS — Z66 Do not resuscitate: Secondary | ICD-10-CM | POA: Diagnosis not present

## 2021-08-11 DIAGNOSIS — C787 Secondary malignant neoplasm of liver and intrahepatic bile duct: Secondary | ICD-10-CM | POA: Diagnosis not present

## 2021-08-11 DIAGNOSIS — Z5111 Encounter for antineoplastic chemotherapy: Secondary | ICD-10-CM | POA: Diagnosis not present

## 2021-08-11 DIAGNOSIS — C161 Malignant neoplasm of fundus of stomach: Secondary | ICD-10-CM

## 2021-08-11 DIAGNOSIS — C169 Malignant neoplasm of stomach, unspecified: Secondary | ICD-10-CM | POA: Diagnosis not present

## 2021-08-11 DIAGNOSIS — R109 Unspecified abdominal pain: Secondary | ICD-10-CM | POA: Diagnosis not present

## 2021-08-11 DIAGNOSIS — G4700 Insomnia, unspecified: Secondary | ICD-10-CM | POA: Diagnosis not present

## 2021-08-11 DIAGNOSIS — E785 Hyperlipidemia, unspecified: Secondary | ICD-10-CM | POA: Diagnosis not present

## 2021-08-11 DIAGNOSIS — I712 Thoracic aortic aneurysm, without rupture, unspecified: Secondary | ICD-10-CM | POA: Diagnosis not present

## 2021-08-11 MED ORDER — DEXTROSE 5 % IV SOLN: Freq: Once | INTRAVENOUS | Status: AC

## 2021-08-11 MED ORDER — SODIUM CHLORIDE 0.9% FLUSH: 10.0000 mL | INTRAVENOUS | Status: DC | PRN

## 2021-08-11 MED ORDER — SODIUM CHLORIDE 0.9 % IV SOLN
10.0000 mg | Freq: Once | INTRAVENOUS | Status: AC
  Administered 2021-08-11: 10 mg via INTRAVENOUS
  Filled 2021-08-11: qty 10

## 2021-08-11 MED ORDER — SODIUM CHLORIDE 0.9 % IV SOLN
2000.0000 mg/m2 | INTRAVENOUS | Status: DC
  Administered 2021-08-11: 4250 mg via INTRAVENOUS
  Filled 2021-08-11: qty 85

## 2021-08-11 MED ORDER — LEUCOVORIN CALCIUM INJECTION 350 MG
400.0000 mg/m2 | Freq: Once | INTRAVENOUS | Status: AC
  Administered 2021-08-11: 852 mg via INTRAVENOUS
  Filled 2021-08-11: qty 42.6

## 2021-08-11 MED ORDER — PALONOSETRON HCL INJECTION 0.25 MG/5ML
0.2500 mg | Freq: Once | INTRAVENOUS | Status: AC
  Administered 2021-08-11: 0.25 mg via INTRAVENOUS
  Filled 2021-08-11: qty 5

## 2021-08-11 MED ORDER — OXALIPLATIN CHEMO INJECTION 100 MG/20ML
70.0000 mg/m2 | Freq: Once | INTRAVENOUS | Status: AC
  Administered 2021-08-11: 150 mg via INTRAVENOUS
  Filled 2021-08-11: qty 10

## 2021-08-11 NOTE — Progress Notes (Signed)
Ok to treat today D1/C1 Folfox per Dr. Ernestina Penna note   "-proceed with C1 FOLFOX tomorrow, 08/11/21 at reduced dose"

## 2021-08-11 NOTE — Patient Instructions (Addendum)
[facility]  Discharge Instructions: Thank you for choosing [facility] to provide your oncology and hematology care.   If you have a lab appointment with the [facility], please go directly to the [facility] and check in at the registration area.   Wear comfortable clothing and clothing appropriate for easy access to any Portacath or PICC line.   We strive to give you quality time with your provider. You may need to reschedule your appointment if you arrive late (15 or more minutes).  Arriving late affects you and other patients whose appointments are after yours.  Also, if you miss three or more appointments without notifying the office, you may be dismissed from the clinic at the providers discretion.      For prescription refill requests, have your pharmacy contact our office and allow 72 hours for refills to be completed.    Today you received the following chemotherapy and/or immunotherapy agents    Oxaliplatin, Leucovorin, Fluorouracil, 5-FU     To help prevent nausea and vomiting after your treatment, we encourage you to take your nausea medication as directed.  BELOW ARE SYMPTOMS THAT SHOULD BE REPORTED IMMEDIATELY: *FEVER GREATER THAN 100.4 F (38 C) OR HIGHER *CHILLS OR SWEATING *NAUSEA AND VOMITING THAT IS NOT CONTROLLED WITH YOUR NAUSEA MEDICATION *UNUSUAL SHORTNESS OF BREATH *UNUSUAL BRUISING OR BLEEDING *URINARY PROBLEMS (pain or burning when urinating, or frequent urination) *BOWEL PROBLEMS (unusual diarrhea, constipation, pain near the anus) TENDERNESS IN MOUTH AND THROAT WITH OR WITHOUT PRESENCE OF ULCERS (sore throat, sores in mouth, or a toothache) UNUSUAL RASH, SWELLING OR PAIN  UNUSUAL VAGINAL DISCHARGE OR ITCHING   Items with * indicate a potential emergency and should be followed up as soon as possible or go to the Emergency Department if any problems should occur.  Please show the CHEMOTHERAPY ALERT CARD or  IMMUNOTHERAPY ALERT CARD at check-in to the Emergency Department and triage nurse.  Should you have questions after your visit or need to cancel or reschedule your appointment, please contact [facility]  Dept: [phone]  and follow the prompts.  Office hours are 8:00 a.m. to 4:30 p.m. Monday - Friday. Please note that voicemails left after 4:00 p.m. may not be returned until the following business day.  We are closed weekends and major holidays. You have access to a nurse at all times for urgent questions. Please call the main number to the clinic Dept: [phone] and follow the prompts.   For any non-urgent questions, you may also contact your provider using MyChart. We now offer e-Visits for anyone 64 and older to request care online for non-urgent symptoms. For details visit [email or web address].   Also download the MyChart app! Go to the app store, search "MyChart", open the app, select [facility], and log in with your MyChart username and password.  Due to Covid, a mask is required upon entering the hospital/clinic. If you do not have a mask, one will be given to you upon arrival. For doctor visits, patients may have 1 support person aged 65 or older with them. For treatment visits, patients cannot have anyone with them due to current Covid guidelines and our immunocompromised population.   Oxaliplatin Injection What is this medication? OXALIPLATIN (ox AL i PLA tin) is a chemotherapy drug. It targets fast dividing cells, like cancer cells, and causes these cells to die. This medicine is used to treat cancers of the colon and rectum, and many  other cancers. This medicine may be used for other purposes; ask your health care provider or pharmacist if you have questions. COMMON BRAND NAME(S): Eloxatin What should I tell my care team before I take this medication? They need to know if you have any of these conditions: heart disease history of irregular  heartbeat liver disease low blood counts, like white cells, platelets, or red blood cells lung or breathing disease, like asthma take medicines that treat or prevent blood clots tingling of the fingers or toes, or other nerve disorder an unusual or allergic reaction to oxaliplatin, other chemotherapy, other medicines, foods, dyes, or preservatives pregnant or trying to get pregnant breast-feeding How should I use this medication? This drug is given as an infusion into a vein. It is administered in a hospital or clinic by a specially trained health care professional. Talk to your pediatrician regarding the use of this medicine in children. Special care may be needed. Overdosage: If you think you have taken too much of this medicine contact a poison control center or emergency room at once. NOTE: This medicine is only for you. Do not share this medicine with others. What if I miss a dose? It is important not to miss a dose. Call your doctor or health care professional if you are unable to keep an appointment. What may interact with this medication? Do not take this medicine with any of the following medications: cisapride dronedarone pimozide thioridazine This medicine may also interact with the following medications: aspirin and aspirin-like medicines certain medicines that treat or prevent blood clots like warfarin, apixaban, dabigatran, and rivaroxaban cisplatin cyclosporine diuretics medicines for infection like acyclovir, adefovir, amphotericin B, bacitracin, cidofovir, foscarnet, ganciclovir, gentamicin, pentamidine, vancomycin NSAIDs, medicines for pain and inflammation, like ibuprofen or naproxen other medicines that prolong the QT interval (an abnormal heart rhythm) pamidronate zoledronic acid This list may not describe all possible interactions. Give your health care provider a list of all the medicines, herbs, non-prescription drugs, or dietary supplements you use. Also tell  them if you smoke, drink alcohol, or use illegal drugs. Some items may interact with your medicine. What should I watch for while using this medication? Your condition will be monitored carefully while you are receiving this medicine. You may need blood work done while you are taking this medicine. This medicine may make you feel generally unwell. This is not uncommon as chemotherapy can affect healthy cells as well as cancer cells. Report any side effects. Continue your course of treatment even though you feel ill unless your healthcare professional tells you to stop. This medicine can make you more sensitive to cold. Do not drink cold drinks or use ice. Cover exposed skin before coming in contact with cold temperatures or cold objects. When out in cold weather wear warm clothing and cover your mouth and nose to warm the air that goes into your lungs. Tell your doctor if you get sensitive to the cold. Do not become pregnant while taking this medicine or for 9 months after stopping it. Women should inform their health care professional if they wish to become pregnant or think they might be pregnant. Men should not father a child while taking this medicine and for 6 months after stopping it. There is potential for serious side effects to an unborn child. Talk to your health care professional for more information. Do not breast-feed a child while taking this medicine or for 3 months after stopping it. This medicine has caused ovarian failure in some  women. This medicine may make it more difficult to get pregnant. Talk to your health care professional if you are concerned about your fertility. This medicine has caused decreased sperm counts in some men. This may make it more difficult to father a child. Talk to your health care professional if you are concerned about your fertility. This medicine may increase your risk of getting an infection. Call your health care professional for advice if you get a fever,  chills, or sore throat, or other symptoms of a cold or flu. Do not treat yourself. Try to avoid being around people who are sick. Avoid taking medicines that contain aspirin, acetaminophen, ibuprofen, naproxen, or ketoprofen unless instructed by your health care professional. These medicines may hide a fever. Be careful brushing or flossing your teeth or using a toothpick because you may get an infection or bleed more easily. If you have any dental work done, tell your dentist you are receiving this medicine. What side effects may I notice from receiving this medication? Side effects that you should report to your doctor or health care professional as soon as possible: allergic reactions like skin rash, itching or hives, swelling of the face, lips, or tongue breathing problems cough low blood counts - this medicine may decrease the number of white blood cells, red blood cells, and platelets. You may be at increased risk for infections and bleeding nausea, vomiting pain, redness, or irritation at site where injected pain, tingling, numbness in the hands or feet signs and symptoms of bleeding such as bloody or black, tarry stools; red or dark brown urine; spitting up blood or brown material that looks like coffee grounds; red spots on the skin; unusual bruising or bleeding from the eyes, gums, or nose signs and symptoms of a dangerous change in heartbeat or heart rhythm like chest pain; dizziness; fast, irregular heartbeat; palpitations; feeling faint or lightheaded; falls signs and symptoms of infection like fever; chills; cough; sore throat; pain or trouble passing urine signs and symptoms of liver injury like dark yellow or brown urine; general ill feeling or flu-like symptoms; light-colored stools; loss of appetite; nausea; right upper belly pain; unusually weak or tired; yellowing of the eyes or skin signs and symptoms of low red blood cells or anemia such as unusually weak or tired; feeling faint  or lightheaded; falls signs and symptoms of muscle injury like dark urine; trouble passing urine or change in the amount of urine; unusually weak or tired; muscle pain; back pain Side effects that usually do not require medical attention (report to your doctor or health care professional if they continue or are bothersome): changes in taste diarrhea gas hair loss loss of appetite mouth sores This list may not describe all possible side effects. Call your doctor for medical advice about side effects. You may report side effects to FDA at 1-800-FDA-1088. Where should I keep my medication? This drug is given in a hospital or clinic and will not be stored at home. NOTE: This sheet is a summary. It may not cover all possible information. If you have questions about this medicine, talk to your doctor, pharmacist, or health care provider.  2022 Elsevier/Gold Standard (2021-04-12 00:00:00)  Leucovorin injection What is this medication? LEUCOVORIN (loo koe VOR in) is used to prevent or treat the harmful effects of some medicines. This medicine is used to treat anemia caused by a low amount of folic acid in the body. It is also used with 5-fluorouracil (5-FU) to treat colon cancer.  This medicine may be used for other purposes; ask your health care provider or pharmacist if you have questions. What should I tell my care team before I take this medication? They need to know if you have any of these conditions: anemia from low levels of vitamin B-12 in the blood an unusual or allergic reaction to leucovorin, folic acid, other medicines, foods, dyes, or preservatives pregnant or trying to get pregnant breast-feeding How should I use this medication? This medicine is for injection into a muscle or into a vein. It is given by a health care professional in a hospital or clinic setting. Talk to your pediatrician regarding the use of this medicine in children. Special care may be needed. Overdosage: If you  think you have taken too much of this medicine contact a poison control center or emergency room at once. NOTE: This medicine is only for you. Do not share this medicine with others. What if I miss a dose? This does not apply. What may interact with this medication? capecitabine fluorouracil phenobarbital phenytoin primidone trimethoprim-sulfamethoxazole This list may not describe all possible interactions. Give your health care provider a list of all the medicines, herbs, non-prescription drugs, or dietary supplements you use. Also tell them if you smoke, drink alcohol, or use illegal drugs. Some items may interact with your medicine. What should I watch for while using this medication? Your condition will be monitored carefully while you are receiving this medicine. This medicine may increase the side effects of 5-fluorouracil, 5-FU. Tell your doctor or health care professional if you have diarrhea or mouth sores that do not get better or that get worse. What side effects may I notice from receiving this medication? Side effects that you should report to your doctor or health care professional as soon as possible: allergic reactions like skin rash, itching or hives, swelling of the face, lips, or tongue breathing problems fever, infection mouth sores unusual bleeding or bruising unusually weak or tired Side effects that usually do not require medical attention (report to your doctor or health care professional if they continue or are bothersome): constipation or diarrhea loss of appetite nausea, vomiting This list may not describe all possible side effects. Call your doctor for medical advice about side effects. You may report side effects to FDA at 1-800-FDA-1088. Where should I keep my medication? This drug is given in a hospital or clinic and will not be stored at home. NOTE: This sheet is a summary. It may not cover all possible information. If you have questions about this  medicine, talk to your doctor, pharmacist, or health care provider.  2022 Elsevier/Gold Standard (2008-01-30 00:00:00)  Fluorouracil, 5-FU injection What is this medication? FLUOROURACIL, 5-FU (flure oh YOOR a sil) is a chemotherapy drug. It slows the growth of cancer cells. This medicine is used to treat many types of cancer like breast cancer, colon or rectal cancer, pancreatic cancer, and stomach cancer. This medicine may be used for other purposes; ask your health care provider or pharmacist if you have questions. COMMON BRAND NAME(S): Adrucil What should I tell my care team before I take this medication? They need to know if you have any of these conditions: blood disorders dihydropyrimidine dehydrogenase (DPD) deficiency infection (especially a virus infection such as chickenpox, cold sores, or herpes) kidney disease liver disease malnourished, poor nutrition recent or ongoing radiation therapy an unusual or allergic reaction to fluorouracil, other chemotherapy, other medicines, foods, dyes, or preservatives pregnant or trying to get pregnant breast-feeding  How should I use this medication? This drug is given as an infusion or injection into a vein. It is administered in a hospital or clinic by a specially trained health care professional. Talk to your pediatrician regarding the use of this medicine in children. Special care may be needed. Overdosage: If you think you have taken too much of this medicine contact a poison control center or emergency room at once. NOTE: This medicine is only for you. Do not share this medicine with others. What if I miss a dose? It is important not to miss your dose. Call your doctor or health care professional if you are unable to keep an appointment. What may interact with this medication? Do not take this medicine with any of the following medications: live virus vaccines This medicine may also interact with the following medications: medicines  that treat or prevent blood clots like warfarin, enoxaparin, and dalteparin This list may not describe all possible interactions. Give your health care provider a list of all the medicines, herbs, non-prescription drugs, or dietary supplements you use. Also tell them if you smoke, drink alcohol, or use illegal drugs. Some items may interact with your medicine. What should I watch for while using this medication? Visit your doctor for checks on your progress. This drug may make you feel generally unwell. This is not uncommon, as chemotherapy can affect healthy cells as well as cancer cells. Report any side effects. Continue your course of treatment even though you feel ill unless your doctor tells you to stop. In some cases, you may be given additional medicines to help with side effects. Follow all directions for their use. Call your doctor or health care professional for advice if you get a fever, chills or sore throat, or other symptoms of a cold or flu. Do not treat yourself. This drug decreases your body's ability to fight infections. Try to avoid being around people who are sick. This medicine may increase your risk to bruise or bleed. Call your doctor or health care professional if you notice any unusual bleeding. Be careful brushing and flossing your teeth or using a toothpick because you may get an infection or bleed more easily. If you have any dental work done, tell your dentist you are receiving this medicine. Avoid taking products that contain aspirin, acetaminophen, ibuprofen, naproxen, or ketoprofen unless instructed by your doctor. These medicines may hide a fever. Do not become pregnant while taking this medicine. Women should inform their doctor if they wish to become pregnant or think they might be pregnant. There is a potential for serious side effects to an unborn child. Talk to your health care professional or pharmacist for more information. Do not breast-feed an infant while taking  this medicine. Men should inform their doctor if they wish to father a child. This medicine may lower sperm counts. Do not treat diarrhea with over the counter products. Contact your doctor if you have diarrhea that lasts more than 2 days or if it is severe and watery. This medicine can make you more sensitive to the sun. Keep out of the sun. If you cannot avoid being in the sun, wear protective clothing and use sunscreen. Do not use sun lamps or tanning beds/booths. What side effects may I notice from receiving this medication? Side effects that you should report to your doctor or health care professional as soon as possible: allergic reactions like skin rash, itching or hives, swelling of the face, lips, or tongue low blood counts -  this medicine may decrease the number of white blood cells, red blood cells and platelets. You may be at increased risk for infections and bleeding. signs of infection - fever or chills, cough, sore throat, pain or difficulty passing urine signs of decreased platelets or bleeding - bruising, pinpoint red spots on the skin, black, tarry stools, blood in the urine signs of decreased red blood cells - unusually weak or tired, fainting spells, lightheadedness breathing problems changes in vision chest pain mouth sores nausea and vomiting pain, swelling, redness at site where injected pain, tingling, numbness in the hands or feet redness, swelling, or sores on hands or feet stomach pain unusual bleeding Side effects that usually do not require medical attention (report to your doctor or health care professional if they continue or are bothersome): changes in finger or toe nails diarrhea dry or itchy skin hair loss headache loss of appetite sensitivity of eyes to the light stomach upset unusually teary eyes This list may not describe all possible side effects. Call your doctor for medical advice about side effects. You may report side effects to FDA at  1-800-FDA-1088. Where should I keep my medication? This drug is given in a hospital or clinic and will not be stored at home. NOTE: This sheet is a summary. It may not cover all possible information. If you have questions about this medicine, talk to your doctor, pharmacist, or health care provider.  2022 Elsevier/Gold Standard (2021-04-12 00:00:00)  The chemotherapy medication bag should finish at 46 hours, 96 hours, or 7 days. For example, if your pump is scheduled for 46 hours and it was put on at 4:00 p.m., it should finish at 2:00 p.m. the day it is scheduled to come off regardless of your appointment time.     Estimated time to finish at 0930.   If the display on your pump reads "Low Volume" and it is beeping, take the batteries out of the pump and come to the cancer center for it to be taken off.   If the pump alarms go off prior to the pump reading "Low Volume" then call [phone] and someone can assist you.  If the plunger comes out and the chemotherapy medication is leaking out, please use your home chemo spill kit to clean up the spill. Do NOT use paper towels or other household products.  If you have problems or questions regarding your pump, please call either [phone] (24 hours a day) or the cancer center Monday-Friday 8:00 a.m.- 4:30 p.m. at the clinic number and we will assist you. If you are unable to get assistance, then go to the nearest Emergency Department and ask the staff to contact the IV team for assistance.

## 2021-08-11 NOTE — Progress Notes (Signed)
Pt is approved for the $1000 [employer] grant.  

## 2021-08-12 ENCOUNTER — Telehealth: Payer: Self-pay | Admitting: *Deleted

## 2021-08-12 ENCOUNTER — Telehealth: Payer: Self-pay

## 2021-08-12 ENCOUNTER — Telehealth: Payer: Self-pay | Admitting: Hematology

## 2021-08-12 ENCOUNTER — Encounter: Payer: Self-pay | Admitting: Hematology

## 2021-08-12 ENCOUNTER — Other Ambulatory Visit: Payer: Self-pay | Admitting: Hematology

## 2021-08-12 MED ORDER — OXYCODONE HCL 5 MG PO TABS: 5.0000 mg | ORAL_TABLET | ORAL | 0 refills | Status: AC | PRN

## 2021-08-12 MED ORDER — MORPHINE SULFATE ER 15 MG PO TBCR: 15.0000 mg | EXTENDED_RELEASE_TABLET | Freq: Two times a day (BID) | ORAL | 0 refills | Status: DC

## 2021-08-12 NOTE — Telephone Encounter (Signed)
Scheduled follow-up appointments per 1/4 los. Patient is aware. 

## 2021-08-12 NOTE — Telephone Encounter (Signed)
Pt returned call from this nurse from earlier today.  Returned call to pt & noted that he had discussed pain with Dr Ernestina Penna RN.  He denies any other problems except discomfort in back & chest & pain med not really helping for long.  He has had to take something every 3 hours.  He has not taken his Ultram so suggested he try taking it with food & if pain not relieved to try 2 Hydrocodone next time & let nurses know if that helps when he is here for pump d/c tomorrow.  Will try to leave message for Dr Burr Medico to address.

## 2021-08-12 NOTE — Telephone Encounter (Signed)
Pt called stating he tolerated the chemotherapy well but his pain is high.  Pt stating he's taking the pain medication that Dr. Burr Medico prescribed but it seems like it's not working.  Pt is asking if Dr. Burr Medico could increase his pain medication because he's having to take it now every 3 hrs instead of how its prescribed just to keep his pain at a somewhat tolerable level.  Sent message to Dr. Burr Medico to contact pt.regarding pain management.

## 2021-08-12 NOTE — Telephone Encounter (Signed)
Called & left message on identified vm to call back to let us know how is doing post new chemo.

## 2021-08-12 NOTE — Telephone Encounter (Signed)
-----   Message from Ishmael Holter, RN sent at 08/11/2021  2:36 PM EST ----- Regarding: Dr. Burr Medico 1st time folfox Dr. Burr Medico. 1st time Folfox. Tolerated well

## 2021-08-13 ENCOUNTER — Inpatient Hospital Stay: Payer: Medicare HMO

## 2021-08-13 ENCOUNTER — Other Ambulatory Visit: Payer: Self-pay

## 2021-08-13 ENCOUNTER — Encounter: Payer: Self-pay | Admitting: Hematology

## 2021-08-13 VITALS — BP 128/82 | HR 87 | Temp 97.8°F | Resp 18

## 2021-08-13 DIAGNOSIS — E785 Hyperlipidemia, unspecified: Secondary | ICD-10-CM | POA: Diagnosis not present

## 2021-08-13 DIAGNOSIS — Z66 Do not resuscitate: Secondary | ICD-10-CM | POA: Diagnosis not present

## 2021-08-13 DIAGNOSIS — G4700 Insomnia, unspecified: Secondary | ICD-10-CM | POA: Diagnosis not present

## 2021-08-13 DIAGNOSIS — I712 Thoracic aortic aneurysm, without rupture, unspecified: Secondary | ICD-10-CM | POA: Diagnosis not present

## 2021-08-13 DIAGNOSIS — R69 Illness, unspecified: Secondary | ICD-10-CM | POA: Diagnosis not present

## 2021-08-13 DIAGNOSIS — C161 Malignant neoplasm of fundus of stomach: Secondary | ICD-10-CM

## 2021-08-13 DIAGNOSIS — R109 Unspecified abdominal pain: Secondary | ICD-10-CM | POA: Diagnosis not present

## 2021-08-13 DIAGNOSIS — C787 Secondary malignant neoplasm of liver and intrahepatic bile duct: Secondary | ICD-10-CM | POA: Diagnosis not present

## 2021-08-13 DIAGNOSIS — C169 Malignant neoplasm of stomach, unspecified: Secondary | ICD-10-CM | POA: Diagnosis not present

## 2021-08-13 DIAGNOSIS — Z5111 Encounter for antineoplastic chemotherapy: Secondary | ICD-10-CM | POA: Diagnosis not present

## 2021-08-13 MED ORDER — HEPARIN SOD (PORK) LOCK FLUSH 100 UNIT/ML IV SOLN
500.0000 [IU] | Freq: Once | INTRAVENOUS | Status: AC | PRN
  Administered 2021-08-13: 500 [IU]

## 2021-08-13 MED ORDER — SODIUM CHLORIDE 0.9% FLUSH
10.0000 mL | INTRAVENOUS | Status: DC | PRN
  Administered 2021-08-13: 10 mL

## 2021-08-15 ENCOUNTER — Other Ambulatory Visit: Payer: Self-pay | Admitting: Hematology

## 2021-08-16 ENCOUNTER — Other Ambulatory Visit: Payer: Self-pay

## 2021-08-16 ENCOUNTER — Ambulatory Visit (HOSPITAL_COMMUNITY): Payer: Medicare HMO | Attending: Cardiology

## 2021-08-16 DIAGNOSIS — I252 Old myocardial infarction: Secondary | ICD-10-CM | POA: Insufficient documentation

## 2021-08-16 DIAGNOSIS — I10 Essential (primary) hypertension: Secondary | ICD-10-CM | POA: Insufficient documentation

## 2021-08-16 DIAGNOSIS — I351 Nonrheumatic aortic (valve) insufficiency: Secondary | ICD-10-CM | POA: Insufficient documentation

## 2021-08-16 DIAGNOSIS — Z0189 Encounter for other specified special examinations: Secondary | ICD-10-CM | POA: Diagnosis not present

## 2021-08-16 DIAGNOSIS — E785 Hyperlipidemia, unspecified: Secondary | ICD-10-CM | POA: Diagnosis not present

## 2021-08-16 DIAGNOSIS — Z01818 Encounter for other preprocedural examination: Secondary | ICD-10-CM | POA: Diagnosis not present

## 2021-08-16 DIAGNOSIS — C161 Malignant neoplasm of fundus of stomach: Secondary | ICD-10-CM | POA: Diagnosis not present

## 2021-08-16 LAB — ECHOCARDIOGRAM COMPLETE
Area-P 1/2: 4.15 cm2
P 1/2 time: 558 msec
S' Lateral: 2.7 cm

## 2021-08-16 NOTE — Progress Notes (Signed)
The following biosimilar Kanjinti (trastuzumab-anns) has been selected for use in this patient.   Plans updated.  Henreitta Leber, PharmD 08/16/21 @ 1230

## 2021-08-16 NOTE — Progress Notes (Signed)
I rescheduled Billy [name]'s echocardiogram to 08/16/2021 at 1500, [address].  Patient aware of change.  Pt is aware of new location. All questions were answered.  He verbalized understanding.

## 2021-08-17 NOTE — Progress Notes (Signed)
[place]   [phone] Fax:(336) [phone]   Clinic Follow up Note   Patient Care Team: Biagio Borg, MD as PCP - General (Internal Medicine) Debara Pickett Nadean Corwin, MD as PCP - Cardiology (Cardiology) Lavonna Monarch, MD (Dermatology) Warren Danes, PA-C as Physician Assistant (Dermatology) Truitt Merle, MD as Consulting Physician (Oncology) Royston Bake, RN as Oncology Nurse Navigator (Oncology) 08/18/2021  CHIEF COMPLAINT: Follow up gastric cancer   SUMMARY OF ONCOLOGIC HISTORY: Oncology History  Gastric cancer ([place])  07/01/2021 Imaging   EXAM: CT ANGIOGRAPHY CHEST WITH CONTRAST  IMPRESSION: 1. No evidence of a pulmonary embolism. 2. Findings consistent with neoplastic disease. Multiple liver masses consistent with widespread hepatic metastatic disease. Ill-defined partly imaged mass along the porta hepatis suspicious for a pancreatic malignancy. There also multiple subcentimeter lung nodules consistent with metastatic disease. Recommend follow-up CT of the abdomen and pelvis with contrast for further assessment 3. No acute findings in the chest. 4. Dilated ascending thoracic aorta to 4.7 cm. Ascending thoracic aortic aneurysm. Recommend semi-annual imaging followup by CTA or MRA and referral to cardiothoracic surgery if not already obtained. This recommendation follows 2010 ACCF/AHA/AATS/ACR/ASA/SCA/SCAI/SIR/STS/SVM Guidelines for the Diagnosis and Management of Patients With Thoracic Aortic Disease. Circulation. 2010; 121: H086-V784. Aortic aneurysm NOS (ICD10-I71.9)     07/01/2021 Imaging   EXAM: CT ABDOMEN AND PELVIS WITH CONTRAST  IMPRESSION: 1. Numerous hypodense hepatic lesions with dominant lesion in the inferior right lobe measuring up to 5.9 cm. Findings worrisome for diffuse metastatic disease. Primary hepatic neoplasm not excluded in the inferior right lobe. 2. Pathologically enlarged periportal, portal caval, peripancreatic and  aortocaval lymph nodes worrisome for metastatic disease. 3. Bilateral inguinal hernias. Left inguinal hernia contains colon. No bowel obstruction. 4.  Aortic Atherosclerosis (ICD10-I70.0).   07/12/2021 Pathology Results   FINAL MICROSCOPIC DIAGNOSIS:   A. LEFT LIVER MASS, NEEDLE CORE BIOPSY:  Metastatic adenocarcinoma.  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   Comment: The morphologic features of the neoplasm are most consistent with metastasis from a primary colorectal adenocarcinoma.  However, possibility of primary in the upper GI tract and pancreaticobiliary tree cannot be excluded.  Markers for lung adenocarcinoma are negative.   ADDENDUM:  HER2 by immunohistochemistry is POSITIVE (3+).  ADDENDUM:  Mismatch Repair Protein (IHC)  SUMMARY INTERPRETATION: NORMAL   07/19/2021 Procedure   Upper Endoscopy, Dr. Fuller Plan  Impression: - Normal esophagus. - Malignant gastric tumor in the gastric fundus. Biopsied. - Normal duodenal bulb and second portion of the duodenum.   07/19/2021 Pathology Results   Diagnosis Stomach, biopsy, Mass - INVASIVE MODERATE TO POORLY DIFFERENTIATED ADENOCARCINOMA,   07/21/2021 Initial Diagnosis   Gastric cancer ([place])   08/11/2021 -  Chemotherapy   Patient is on Treatment Plan : GASTROESOPHAGEAL FOLFOX q14d x 6 cycles     08/18/2021 -  Chemotherapy   Patient is on Treatment Plan : Gastric - Herceptin & Keytruda Q 21 days       CURRENT THERAPY: FOLFOX q2 weeks starting 08/11/21, trastuzumab and pembrolizumab q. 21 days starting 08/18/2021.  Plan to switch FOLFOX to Capox after 2 cycles  INTERVAL HISTORY: Billy [name] returns for follow up as scheduled. Last seen 08/10/21 and began FOLFOX on 08/11/21. Baseline echo 08/16/21 showed EF 60-65%. He is here today to start herceptin and [place].  He had "0 problems" from chemo.  He did not experience much cold sensitivity.  He has mild fatigue but remained awake and active at home.  Bowels moving  well on Colace, Senokot, MiraLAX, and prune juice twice daily.  He had abdominal and back pain until starting MS Contin twice daily, pain has essentially resolved.  He has not had to take hydrocodone or oxycodone.  Mirtazapine is helping him sleep.  He feels occasionally "drowsy and fuzzy" for medication but not bad.  He has mild exertional dyspnea. He took a Xanax this morning to take the edge off before treatment.  Otherwise he denies cough, chest pain, fever, chills, nausea/vomiting, mucositis, rash, or any other new complaints.   MEDICAL HISTORY:  Past Medical History:  Diagnosis Date   Basal cell carcinoma 05/29/2012   left thigh-(EXC)   Basal cell carcinoma 10/23/2012   right side of nose (MOHS)   Basal cell carcinoma 12/12/2012   nod-irght cheek (MOHS)   Basal cell carcinoma 05/26/2015   nod-left calf (CX35FU)   Basal cell carcinoma 05/26/2015   nod-left nose (txpbx)   Basal cell carcinoma 06/08/2016   nod-above right brow (CX35FU)   Basal cell carcinoma 09/12/2016   left nose (MOHS)   Basal cell carcinoma 01/29/2019   nod-right tip of nose (MOHS)   Benign neoplasm of colon    Finger fracture    Hx of colonoscopy    Hyperlipidemia    Hypertension    Internal hemorrhoids without mention of complication    Myocardial infarction ([place])    09/01/2015   Plantar fascial fibromatosis    Prostatitis    Rib fracture    Squamous cell carcinoma of skin 07/13/2009   RIght lower outer leg   Squamous cell carcinoma of skin 02/17/2014   in situ-right temple (txpbx)   Squamous cell carcinoma of skin 01/24/2018   in situ-right chest (txpbx)   Squamous cell carcinoma of skin 01/24/2018   in situ0 right forehead (txpbx)   Squamous cell carcinoma of skin     SURGICAL HISTORY: Past Surgical History:  Procedure Laterality Date   cardiac stint  2017   COLONOSCOPY  2016   IR IMAGING GUIDED PORT INSERTION  07/12/2021   IR US GUIDE BX ASP/DRAIN  07/12/2021   POLYPECTOMY     TONSILLECTOMY      VARICOCELECTOMY     Left    I have reviewed the social history and family history with the patient and they are unchanged from previous note.  ALLERGIES:  is allergic to ofloxacin.  MEDICATIONS:  Current Outpatient Medications  Medication Sig Dispense Refill   atorvastatin (LIPITOR) 80 MG tablet Take 1 tablet (80 mg total) by mouth daily at 6 PM. 90 tablet 3   Cholecalciferol (VITAMIN D3 PO) Take 1 tablet by mouth daily.     lisinopril (ZESTRIL) 2.5 MG tablet Take 1 tablet (2.5 mg total) by mouth daily. 90 tablet 3   mirtazapine (REMERON) 7.5 MG tablet Take 1 tablet (7.5 mg total) by mouth at bedtime. 30 tablet 0   morphine (MS CONTIN) 15 MG 12 hr tablet Take 1 tablet (15 mg total) by mouth every 12 (twelve) hours. 30 tablet 0   Omega-3 Fatty Acids (FISH OIL) 500 MG CAPS Take 500 mg by mouth daily.     ALPRAZolam (XANAX) 0.5 MG tablet Take 1 tablet (0.5 mg total) by mouth 2 (two) times daily as needed for anxiety. 60 tablet 0   aspirin EC 81 MG tablet Take 81 mg by mouth daily.     HYDROcodone-acetaminophen (NORCO) 5-325 MG tablet Take 1 tablet by mouth every 4 (four) hours as needed for moderate pain. 60 tablet  0   lidocaine-prilocaine (EMLA) cream Apply 1 application topically as needed. Apply 0.33gm to PortaCath site 30 to 60 mins prior to PortaCath access. 30 g 2   nitroGLYCERIN (NITROSTAT) 0.4 MG SL tablet Place 1 tablet (0.4 mg total) under the tongue every 5 (five) minutes as needed for chest pain. Max 3 doses. 25 tablet 3   ondansetron (ZOFRAN) 8 MG tablet Take 1 tablet (8 mg total) by mouth every 8 (eight) hours as needed for nausea or vomiting. 30 tablet 3   oxyCODONE (OXY IR/ROXICODONE) 5 MG immediate release tablet Take 1 tablet (5 mg total) by mouth every 4 (four) hours as needed for severe pain. 30 tablet 0   prochlorperazine (COMPAZINE) 10 MG tablet Take 1 tablet (10 mg total) by mouth every 6 (six) hours as needed for nausea or vomiting. 30 tablet 3   traMADol (ULTRAM)  50 MG tablet Take 1 tablet (50 mg total) by mouth every 6 (six) hours as needed. 30 tablet 0   No current facility-administered medications for this visit.   Facility-Administered Medications Ordered in Other Visits  Medication Dose Route Frequency Provider Last Rate Last Admin   heparin lock flush 100 unit/mL  500 Units Intracatheter Once PRN Truitt Merle, MD       pembrolizumab [facility]) 200 mg in sodium chloride 0.9 % 50 mL chemo infusion  200 mg Intravenous Once Truitt Merle, MD       sodium chloride flush (NS) 0.9 % injection 10 mL  10 mL Intracatheter PRN Truitt Merle, MD       trastuzumab-anns [facility]) 693 mg in sodium chloride 0.9 % 250 mL chemo infusion  8 mg/kg (Treatment Plan Recorded) Intravenous Once Truitt Merle, MD        PHYSICAL EXAMINATION: ECOG PERFORMANCE STATUS: 1 - Symptomatic but completely ambulatory  Vitals:   08/18/21 0935 08/18/21 1039  BP: 102/67   Pulse: 73   Resp: 17   Temp: 98.3 F (36.8 C)   SpO2: 92% 98%   Filed Weights   08/18/21 0935  Weight: 189 lb 12.8 oz (86.1 kg)    GENERAL:alert, no distress and comfortable SKIN: no rash  EYES: sclera clear LYMPH: Palpable 1-1.5 cm lymph node in the left low cervical/supraclavicular space  LUNGS: clear with normal breathing effort HEART: regular rate & rhythm, no lower extremity edema ABDOMEN:abdomen soft, non-tender and normal bowel sounds.  RUQ fullness over the liver NEURO: alert & oriented x 3 with fluent speech, no focal motor/sensory deficits PAC without erythema  LABORATORY DATA:  I have reviewed the data as listed CBC Latest Ref Rng & Units 08/18/2021 08/10/2021 07/12/2021  WBC 4.0 - 10.5 K/uL 16.9(H) 14.9(H) 10.2  Hemoglobin 13.0 - 17.0 g/dL 9.8(L) 11.2(L) 12.0(L)  Hematocrit 39.0 - 52.0 % 30.1(L) 34.3(L) 36.8(L)  Platelets 150 - 400 K/uL 285 297 221     CMP Latest Ref Rng & Units 08/18/2021 08/10/2021 07/06/2021  Glucose 70 - 99 mg/dL 116(H) 108(H) 98  BUN 8 - 23 mg/dL _0 Creatinine 0.61 -  1.24 mg/dL 1.00 0.96 0.94  Sodium 135 - 145 mmol/L 131(L) 135 138  Potassium 3.5 - 5.1 mmol/L 4.4 4.3 4.7  Chloride 98 - 111 mmol/L 97(L) 100 105  CO2 22 - 32 mmol/L _1 Calcium 8.9 - 10.3 mg/dL 8.4(L) 8.8(L) 8.9  Total Protein 6.5 - 8.1 g/dL 6.2(L) 6.7 6.9  Total Bilirubin 0.3 - 1.2 mg/dL 1.0 1.0 0.8  Alkaline Phos 38 - 126 U/L  507(H) 567(H) 144(H)  AST 15 - 41 U/L 123(H) 200(H) 83(H)  ALT 0 - 44 U/L 69(H) 117(H) 52(H)      RADIOGRAPHIC STUDIES: I have personally reviewed the radiological images as listed and agreed with the findings in the report. ECHOCARDIOGRAM COMPLETE  Result Date: 08/16/2021    ECHOCARDIOGRAM REPORT   Patient Name:   Billy [name] Date of Exam: 08/16/2021 Medical Rec #:  [id number]      Height:       73.0 in Accession #:    [id number]     Weight:       189.6 lb Date of Birth:  22-Sep-1940      BSA:          2.103 m Patient Age:    38 years       BP:           124/90 mmHg Patient Gender: M              HR:           73 bpm. Exam Location:  [place] Procedure: 2D Echo, Cardiac Doppler, Color Doppler and Strain Analysis Indications:    Z09 Chemo  History:        Patient has no prior history of Echocardiogram examinations.                 Previous Myocardial Infarction; Risk Factors:Hypertension and                 Dyslipidemia.  Sonographer:    Wilford Sports Rodgers-Jones RDCS Referring Phys: [id number] [place]  1. Left ventricular ejection fraction, by estimation, is 60 to 65%. The left ventricle has normal function. The left ventricle has no regional wall motion abnormalities. Left ventricular diastolic parameters are consistent with Grade I diastolic dysfunction (impaired relaxation). The average left ventricular global longitudinal strain is -23.0 %. The global longitudinal strain is normal.  2. Right ventricular systolic function is normal. The right ventricular size is mildly enlarged. There is normal pulmonary artery systolic pressure. The estimated right  ventricular systolic pressure is 42.5 mmHg.  3. Right atrial size was mildly dilated.  4. The mitral valve is normal in structure. No evidence of mitral valve regurgitation. No evidence of mitral stenosis.  5. The aortic valve is normal in structure. There is mild calcification of the aortic valve. There is mild thickening of the aortic valve. Aortic valve regurgitation is mild. Aortic valve sclerosis/calcification is present, without any evidence of aortic stenosis.  6. Aortic dilatation noted. There is moderate dilatation of the ascending aorta, measuring 46 mm.  7. The inferior vena cava is normal in size with greater than 50% respiratory variability, suggesting right atrial pressure of 3 mmHg. FINDINGS  Left Ventricle: Left ventricular ejection fraction, by estimation, is 60 to 65%. The left ventricle has normal function. The left ventricle has no regional wall motion abnormalities. The average left ventricular global longitudinal strain is -23.0 %. The global longitudinal strain is normal. The left ventricular internal cavity size was normal in size. There is no left ventricular hypertrophy. Left ventricular diastolic parameters are consistent with Grade I diastolic dysfunction (impaired relaxation). Right Ventricle: The right ventricular size is mildly enlarged. No increase in right ventricular wall thickness. Right ventricular systolic function is normal. There is normal pulmonary artery systolic pressure. The tricuspid regurgitant velocity is 2.74  m/s, and with an assumed right atrial pressure of 3 mmHg, the estimated right ventricular systolic pressure is 33.0  mmHg. Left Atrium: Left atrial size was normal in size. Right Atrium: Right atrial size was mildly dilated. Pericardium: There is no evidence of pericardial effusion. Mitral Valve: The mitral valve is normal in structure. No evidence of mitral valve regurgitation. No evidence of mitral valve stenosis. Tricuspid Valve: The tricuspid valve is normal in  structure. Tricuspid valve regurgitation is mild . No evidence of tricuspid stenosis. Aortic Valve: The aortic valve is normal in structure. There is mild calcification of the aortic valve. There is mild thickening of the aortic valve. Aortic valve regurgitation is mild. Aortic regurgitation PHT measures 558 msec. Aortic valve sclerosis/calcification is present, without any evidence of aortic stenosis. Pulmonic Valve: The pulmonic valve was normal in structure. Pulmonic valve regurgitation is trivial. No evidence of pulmonic stenosis. Aorta: Aortic dilatation noted. There is moderate dilatation of the ascending aorta, measuring 46 mm. Venous: The inferior vena cava is normal in size with greater than 50% respiratory variability, suggesting right atrial pressure of 3 mmHg. IAS/Shunts: No atrial level shunt detected by color flow Doppler.  LEFT VENTRICLE PLAX 2D LVIDd:         4.40 cm   Diastology LVIDs:         2.70 cm   LV e' medial:    7.94 cm/s LV PW:         1.10 cm   LV E/e' medial:  9.8 LV IVS:        1.10 cm   LV e' lateral:   11.70 cm/s LVOT diam:     2.00 cm   LV E/e' lateral: 6.6 LV SV:         78 LV SV Index:   37        2D Longitudinal Strain LVOT Area:     3.14 cm  2D Strain GLS Avg:     -23.0 %  RIGHT VENTRICLE RV Basal diam:  4.90 cm RV S prime:     20.40 cm/s TAPSE (M-mode): 2.5 cm LEFT ATRIUM             Index        RIGHT ATRIUM           Index LA diam:        4.30 cm 2.04 cm/m   RA Area:     21.50 cm LA Vol (A2C):   63.8 ml 30.33 ml/m  RA Volume:   70.90 ml  33.71 ml/m LA Vol (A4C):   33.8 ml 16.07 ml/m LA Biplane Vol: 49.2 ml 23.39 ml/m  AORTIC VALVE LVOT Vmax:   154.00 cm/s LVOT Vmean:  107.500 cm/s LVOT VTI:    0.247 m AI PHT:      558 msec  AORTA Ao Root diam: 4.20 cm Ao Asc diam:  4.75 cm MITRAL VALVE                TRICUSPID VALVE MV Area (PHT): 4.15 cm     TR Peak grad:   30.0 mmHg MV Decel Time: 183 msec     TR Vmax:        274.00 cm/s MV E velocity: 77.60 cm/s MV A velocity: 108.00  cm/s  SHUNTS MV E/A ratio:  0.72         Systemic VTI:  0.25 m                             Systemic Diam: 2.00 cm Candee Furbish MD Electronically  signed by Candee Furbish MD Signature Date/Time: 08/16/2021/3:37:30 PM    Final      ASSESSMENT & PLAN: NAHIEM DREDGE is a 81 y.o. male with    1. Metastatic gastric adenocarcinoma to liver, nodes, and possible lungs, [id number], stage IV,MMR normal, HER2 (+) -presented with epigastric pain. CT CAP on 07/01/21 showed multiple hepatic lesions measuring up to 5.9 cm and pathologically enlarged abdominal lymph nodes, and multiple small lung nodules up to 93m. -liver biopsy on 07/12/21 showed metastatic adenocarcinoma, most consistent with primary colorectal or up GI -upper endoscopy on 07/19/21 by Dr. SFuller Planshowed a 4 cm gastric fundus mass. Pathology confirmed invasive moderately to poorly differentiated adenocarcinoma. Additional testing showed Her2+ and MMR normal. -Given his Her2 positivity, Dr. FBurr Medicorecommend adding trastuzumab and Keytruda to his treatment, based on NCCN guideline. Baseline echo with normal EF 60-65%. -He began FOLFOX 08/11/2021, tolerated first week well with mild fatigue.  -He will start trastuzumab and pembrolizumab today 08/18/2021 -To synchronize his treatment, the plan is to change to q3 week Capox with cycle 3 chemo, and continue trastuzumab and pembrolizumab every 3 weeks.  Was reviewed in detail today, patient agrees. -The patient has a palpable 1-1.5 cm left cervical/supraclavicular node to follow on treatment -will obtain new baseline CT CAP prior to cycle 2    2. Abdominal Pain -Tramadol was not adequate for his pain control, changed to hydrocodone, he is taking 5 to 6 tablets a day, pain is controlled -Since he is starting chemotherapy, hydrocodone was changed to oxycodone to avoid excessive Tylenol -Since starting MS Contin twice daily (08/12/2021) his pain has essentially resolved, not required hydrocodone or oxycodone since then    3. Goal of care discussion, partial DNR, Social Support -He understands the incurable nature of his cancer, and the overall poor prognosis, especially if he does not have good response to chemotherapy or progress on chemo -The patient understands the goal of care is palliative. -they have two children that live here in town. -he has a living well, I made a copy today.  He does not want to be on life support if he is terminal.  -Currently on full code, he is willing to change DNR when his cancer progress further and he has no or limited treatment options    4. Anxiety and insomnia  -he has xanax as needed  -Mirtazapine has been helpful   Disposition: Mr. RStcyrappears stable.  S/p cycle 1 day 8 FOLFOX which she tolerated very well with mild fatigue.  Bowels moving well on medication.  Pain and insomnia have improved on the current regimen as well, continue.  Otherwise no significant side effects, he was able to recover and function well.  Patient has a palpable left low cervical/supraclavicular lymph node, we discussed this will be monitored on treatment to help uKoreagauge his response to therapy.  He is being referred for new baseline CT CAP.  Labs reviewed, adequate to proceed with first trastuzumab and pembrolizumab today as planned.  He will return in 1 week for cycle 2 FOLFOX.  The plan is to change FOLFOX to Capox in 3 weeks to synchronize with trastuzumab and pembrolizumab.   Follow-up in 1 week, or sooner if needed.  The plan was reviewed with the family and Dr. FBurr Medico   Orders Placed This Encounter  Procedures   CT CHEST ABDOMEN PELVIS W CONTRAST    Standing Status:   Future    Standing Expiration Date:   08/18/2022  Order Specific Question:   Preferred imaging location?    Answer:   [facility]    Order Specific Question:   Is Oral Contrast requested for this exam?    Answer:   Yes, Per Radiology protocol   All questions were answered. The patient knows to call the  clinic with any problems, questions or concerns. No barriers to learning was detected. I spent 20 minutes counseling the patient face to face. The total time spent in the appointment was 30 minutes and more than 50% was on counseling and review of test results     Alla Feeling, NP 08/18/21

## 2021-08-18 ENCOUNTER — Encounter: Payer: Self-pay | Admitting: Nurse Practitioner

## 2021-08-18 ENCOUNTER — Inpatient Hospital Stay: Payer: Medicare HMO

## 2021-08-18 ENCOUNTER — Other Ambulatory Visit: Payer: Self-pay

## 2021-08-18 ENCOUNTER — Inpatient Hospital Stay: Payer: Medicare HMO | Admitting: Nurse Practitioner

## 2021-08-18 VITALS — BP 102/67 | HR 73 | Temp 98.3°F | Resp 17 | Wt 189.8 lb

## 2021-08-18 DIAGNOSIS — C161 Malignant neoplasm of fundus of stomach: Secondary | ICD-10-CM

## 2021-08-18 DIAGNOSIS — C169 Malignant neoplasm of stomach, unspecified: Secondary | ICD-10-CM | POA: Diagnosis not present

## 2021-08-18 DIAGNOSIS — C787 Secondary malignant neoplasm of liver and intrahepatic bile duct: Secondary | ICD-10-CM | POA: Diagnosis not present

## 2021-08-18 DIAGNOSIS — R69 Illness, unspecified: Secondary | ICD-10-CM | POA: Diagnosis not present

## 2021-08-18 DIAGNOSIS — Z95828 Presence of other vascular implants and grafts: Secondary | ICD-10-CM

## 2021-08-18 DIAGNOSIS — I712 Thoracic aortic aneurysm, without rupture, unspecified: Secondary | ICD-10-CM | POA: Diagnosis not present

## 2021-08-18 DIAGNOSIS — Z5111 Encounter for antineoplastic chemotherapy: Secondary | ICD-10-CM | POA: Diagnosis not present

## 2021-08-18 DIAGNOSIS — G4700 Insomnia, unspecified: Secondary | ICD-10-CM | POA: Diagnosis not present

## 2021-08-18 DIAGNOSIS — Z66 Do not resuscitate: Secondary | ICD-10-CM | POA: Diagnosis not present

## 2021-08-18 DIAGNOSIS — E785 Hyperlipidemia, unspecified: Secondary | ICD-10-CM | POA: Diagnosis not present

## 2021-08-18 DIAGNOSIS — R109 Unspecified abdominal pain: Secondary | ICD-10-CM | POA: Diagnosis not present

## 2021-08-18 LAB — CBC WITH DIFFERENTIAL/PLATELET
Abs Immature Granulocytes: 0.14 10*3/uL — ABNORMAL HIGH (ref 0.00–0.07)
Basophils Absolute: 0.1 10*3/uL (ref 0.0–0.1)
Basophils Relative: 0 %
Eosinophils Absolute: 0.1 10*3/uL (ref 0.0–0.5)
Eosinophils Relative: 1 %
HCT: 30.1 % — ABNORMAL LOW (ref 39.0–52.0)
Hemoglobin: 9.8 g/dL — ABNORMAL LOW (ref 13.0–17.0)
Immature Granulocytes: 1 %
Lymphocytes Relative: 7 %
Lymphs Abs: 1.2 10*3/uL (ref 0.7–4.0)
MCH: 30.1 pg (ref 26.0–34.0)
MCHC: 32.6 g/dL (ref 30.0–36.0)
MCV: 92.3 fL (ref 80.0–100.0)
Monocytes Absolute: 1.9 10*3/uL — ABNORMAL HIGH (ref 0.1–1.0)
Monocytes Relative: 11 %
Neutro Abs: 13.6 10*3/uL — ABNORMAL HIGH (ref 1.7–7.7)
Neutrophils Relative %: 80 %
Platelets: 285 10*3/uL (ref 150–400)
RBC: 3.26 MIL/uL — ABNORMAL LOW (ref 4.22–5.81)
RDW: 13.2 % (ref 11.5–15.5)
WBC: 16.9 10*3/uL — ABNORMAL HIGH (ref 4.0–10.5)
nRBC: 0.1 % (ref 0.0–0.2)

## 2021-08-18 LAB — COMPREHENSIVE METABOLIC PANEL
ALT: 69 U/L — ABNORMAL HIGH (ref 0–44)
AST: 123 U/L — ABNORMAL HIGH (ref 15–41)
Albumin: 3.1 g/dL — ABNORMAL LOW (ref 3.5–5.0)
Alkaline Phosphatase: 507 U/L — ABNORMAL HIGH (ref 38–126)
Anion gap: 8 (ref 5–15)
BUN: 23 mg/dL (ref 8–23)
CO2: 26 mmol/L (ref 22–32)
Calcium: 8.4 mg/dL — ABNORMAL LOW (ref 8.9–10.3)
Chloride: 97 mmol/L — ABNORMAL LOW (ref 98–111)
Creatinine, Ser: 1 mg/dL (ref 0.61–1.24)
GFR, Estimated: >60 mL/min (ref >60)
Glucose, Bld: 116 mg/dL — ABNORMAL HIGH (ref 70–99)
Potassium: 4.4 mmol/L (ref 3.5–5.1)
Sodium: 131 mmol/L — ABNORMAL LOW (ref 135–145)
Total Bilirubin: 1 mg/dL (ref 0.3–1.2)
Total Protein: 6.2 g/dL — ABNORMAL LOW (ref 6.5–8.1)

## 2021-08-18 LAB — TSH: TSH: 2.203 u[IU]/mL (ref 0.320–4.118)

## 2021-08-18 MED ORDER — DIPHENHYDRAMINE HCL 25 MG PO CAPS
25.0000 mg | ORAL_CAPSULE | Freq: Once | ORAL | Status: AC
  Administered 2021-08-18: 25 mg via ORAL
  Filled 2021-08-18: qty 1

## 2021-08-18 MED ORDER — TRASTUZUMAB-ANNS CHEMO 150 MG IV SOLR
8.0000 mg/kg | Freq: Once | INTRAVENOUS | Status: AC
  Administered 2021-08-18: 693 mg via INTRAVENOUS
  Filled 2021-08-18: qty 33

## 2021-08-18 MED ORDER — HEPARIN SOD (PORK) LOCK FLUSH 100 UNIT/ML IV SOLN
500.0000 [IU] | Freq: Once | INTRAVENOUS | Status: AC | PRN
  Administered 2021-08-18: 500 [IU]

## 2021-08-18 MED ORDER — SODIUM CHLORIDE 0.9 % IV SOLN: Freq: Once | INTRAVENOUS | Status: AC

## 2021-08-18 MED ORDER — SODIUM CHLORIDE 0.9% FLUSH
10.0000 mL | INTRAVENOUS | Status: AC | PRN
  Administered 2021-08-18: 10 mL

## 2021-08-18 MED ORDER — ACETAMINOPHEN 325 MG PO TABS
650.0000 mg | ORAL_TABLET | Freq: Once | ORAL | Status: AC
  Administered 2021-08-18: 650 mg via ORAL
  Filled 2021-08-18: qty 2

## 2021-08-18 MED ORDER — SODIUM CHLORIDE 0.9 % IV SOLN
200.0000 mg | Freq: Once | INTRAVENOUS | Status: AC
  Administered 2021-08-18: 200 mg via INTRAVENOUS
  Filled 2021-08-18: qty 8

## 2021-08-18 MED ORDER — SODIUM CHLORIDE 0.9% FLUSH
10.0000 mL | INTRAVENOUS | Status: DC | PRN
  Administered 2021-08-18: 10 mL

## 2021-08-18 NOTE — Progress Notes (Signed)
I reviewed CT scan appt and instructions with Billy [name].  I gave him 2 bottles of oral contrast with written instructions. All questions were answered. He verbalized understanding.

## 2021-08-18 NOTE — Patient Instructions (Signed)
[facility]  Discharge Instructions: Thank you for choosing [facility] to provide your oncology and hematology care.   If you have a lab appointment with the [facility], please go directly to the [facility] and check in at the registration area.   Wear comfortable clothing and clothing appropriate for easy access to any Portacath or PICC line.   We strive to give you quality time with your provider. You may need to reschedule your appointment if you arrive late (15 or more minutes).  Arriving late affects you and other patients whose appointments are after yours.  Also, if you miss three or more appointments without notifying the office, you may be dismissed from the clinic at the providers discretion.      For prescription refill requests, have your pharmacy contact our office and allow 72 hours for refills to be completed.    Today you received the following chemotherapy and/or immunotherapy agents Trastuzumab and Keytruda      To help prevent nausea and vomiting after your treatment, we encourage you to take your nausea medication as directed.  BELOW ARE SYMPTOMS THAT SHOULD BE REPORTED IMMEDIATELY: *FEVER GREATER THAN 100.4 F (38 C) OR HIGHER *CHILLS OR SWEATING *NAUSEA AND VOMITING THAT IS NOT CONTROLLED WITH YOUR NAUSEA MEDICATION *UNUSUAL SHORTNESS OF BREATH *UNUSUAL BRUISING OR BLEEDING *URINARY PROBLEMS (pain or burning when urinating, or frequent urination) *BOWEL PROBLEMS (unusual diarrhea, constipation, pain near the anus) TENDERNESS IN MOUTH AND THROAT WITH OR WITHOUT PRESENCE OF ULCERS (sore throat, sores in mouth, or a toothache) UNUSUAL RASH, SWELLING OR PAIN  UNUSUAL VAGINAL DISCHARGE OR ITCHING   Items with * indicate a potential emergency and should be followed up as soon as possible or go to the Emergency Department if any problems should occur.  Please show the CHEMOTHERAPY ALERT CARD or IMMUNOTHERAPY ALERT CARD  at check-in to the Emergency Department and triage nurse.  Should you have questions after your visit or need to cancel or reschedule your appointment, please contact [facility]  Dept: [phone]  and follow the prompts.  Office hours are 8:00 a.m. to 4:30 p.m. Monday - Friday. Please note that voicemails left after 4:00 p.m. may not be returned until the following business day.  We are closed weekends and major holidays. You have access to a nurse at all times for urgent questions. Please call the main number to the clinic Dept: [phone] and follow the prompts.   For any non-urgent questions, you may also contact your provider using MyChart. We now offer e-Visits for anyone 40 and older to request care online for non-urgent symptoms. For details visit [email or web address].   Also download the MyChart app! Go to the app store, search "MyChart", open the app, select [facility], and log in with your MyChart username and password.  Due to Covid, a mask is required upon entering the hospital/clinic. If you do not have a mask, one will be given to you upon arrival. For doctor visits, patients may have 1 support person aged 81 or older with them. For treatment visits, patients cannot have anyone with them due to current Covid guidelines and our immunocompromised population.

## 2021-08-19 ENCOUNTER — Telehealth: Payer: Self-pay

## 2021-08-19 NOTE — Telephone Encounter (Signed)
Followed up with pt regarding triage done by Donnamarie Poag RN. He was experiencing chills, shortness of breath/wheezing and fever of 101.5, diaphoretic. He states he was given oxygen as well. This LPN asked the pt if he was still experiencing any symptoms today and pt stated he feels completley fine today. No shortness of breath or trouble breathing. Pt is aware this is noted and knows to call back with any further concerns.

## 2021-08-21 ENCOUNTER — Other Ambulatory Visit: Payer: Self-pay | Admitting: Hematology

## 2021-08-21 MED ORDER — CAPECITABINE 500 MG PO TABS
ORAL_TABLET | ORAL | 0 refills | Status: DC
  Filled 2021-08-21: qty 84, fill #0

## 2021-08-22 ENCOUNTER — Encounter: Payer: Self-pay | Admitting: Hematology

## 2021-08-22 ENCOUNTER — Telehealth: Payer: Self-pay | Admitting: Pharmacist

## 2021-08-22 ENCOUNTER — Other Ambulatory Visit (HOSPITAL_COMMUNITY): Payer: Self-pay

## 2021-08-22 NOTE — Telephone Encounter (Addendum)
Oral Oncology Pharmacist Encounter  Received new prescription for Xeloda (capecitabine) for the treatment of metastatic gastric cancer, HER-2 positive in conjunction with oxaliplatin, trastuzumab and pembrolizumab, planned duration until disease progression or unacceptable drug toxicity.  CBC w/ Diff and CMP from 08/18/21 assessed, noted AST 123 U/L, ALT 69 U/L, T. Bili 1 mg/dL - no baseline dose adjustments required. Will f/u start date with MD. OV scheduled 09/14/21.  Current medication list in Epic reviewed, DDIs with Xeloda identified: Category C DDI between Xeloda and Ondansetron due to risk of Qtc prolongation with fluorouracil products. Noted patient only taking PRN and PO route, risk higher with IV administration. No change in therapy warranted at this time.   Evaluated chart and no patient barriers to medication adherence noted.   Patient agreement for treatment documented in MD note on 08/18/21.  Prescription has been e-scribed to the [facility] for benefits analysis and approval.  Oral Oncology Clinic will continue to follow for insurance authorization, copayment issues, initial counseling and start date.  Leron Croak, PharmD, BCPS Hematology/Oncology Clinical Pharmacist [facility] and [place] [phone] 08/22/2021 8:27 AM

## 2021-08-23 ENCOUNTER — Encounter: Payer: Self-pay | Admitting: Hematology

## 2021-08-23 ENCOUNTER — Other Ambulatory Visit (HOSPITAL_COMMUNITY): Payer: Medicare HMO

## 2021-08-23 ENCOUNTER — Telehealth: Payer: Self-pay

## 2021-08-23 ENCOUNTER — Other Ambulatory Visit (HOSPITAL_COMMUNITY): Payer: Self-pay

## 2021-08-23 NOTE — Telephone Encounter (Signed)
Oral Oncology [name] Advocate Encounter   Was successful in securing [name] a $4000 grant from [employer] to provide copayment coverage for Xeloda.  This will keep the out of pocket expense at $0.      The billing information is as follows and has been shared with [place].   Member ID: [id number] Group ID: [id number] RxBin: [id number] PCN: [id number] Dates of Eligibility: 08/23/21 through 08/23/22  Fund name:  Gastric.  [name] Phone [phone] Fax [phone] 08/23/2021 12:11 PM

## 2021-08-23 NOTE — Telephone Encounter (Signed)
Oral Oncology Patient Advocate Encounter  Prior Authorization for Xeloda has been approved.    PA# [id number] Effective dates: 08/23/21 through 08/23/22  Patients co-pay is $328  Oral Oncology Clinic will continue to follow.  [place] Patient [place] Phone [phone] Fax [phone] 08/23/2021 10:54 AM

## 2021-08-23 NOTE — Telephone Encounter (Signed)
Oral Oncology Patient Advocate Encounter   Received notification from [facility] that prior authorization for Capecitabine is required.   PA submitted by phone [phone] Key [id number] Status is pending   Oral Oncology Clinic will continue to follow.  [place] Patient [place] Phone [phone] Fax [phone] 08/23/2021 10:52 AM

## 2021-08-24 ENCOUNTER — Other Ambulatory Visit: Payer: Self-pay

## 2021-08-24 ENCOUNTER — Ambulatory Visit (HOSPITAL_COMMUNITY)
Admission: RE | Admit: 2021-08-24 | Discharge: 2021-08-24 | Disposition: A | Discharge status: Home / Self Care | Payer: Medicare HMO | Source: Ambulatory Visit | Attending: Nurse Practitioner | Admitting: Nurse Practitioner

## 2021-08-24 DIAGNOSIS — C169 Malignant neoplasm of stomach, unspecified: Secondary | ICD-10-CM | POA: Diagnosis not present

## 2021-08-24 DIAGNOSIS — R918 Other nonspecific abnormal finding of lung field: Secondary | ICD-10-CM | POA: Diagnosis not present

## 2021-08-24 DIAGNOSIS — I2699 Other pulmonary embolism without acute cor pulmonale: Secondary | ICD-10-CM | POA: Diagnosis not present

## 2021-08-24 DIAGNOSIS — R509 Fever, unspecified: Secondary | ICD-10-CM | POA: Diagnosis not present

## 2021-08-24 DIAGNOSIS — I2693 Single subsegmental pulmonary embolism without acute cor pulmonale: Secondary | ICD-10-CM | POA: Diagnosis not present

## 2021-08-24 DIAGNOSIS — R911 Solitary pulmonary nodule: Secondary | ICD-10-CM | POA: Diagnosis not present

## 2021-08-24 DIAGNOSIS — I7121 Aneurysm of the ascending aorta, without rupture: Secondary | ICD-10-CM | POA: Diagnosis not present

## 2021-08-24 DIAGNOSIS — C161 Malignant neoplasm of fundus of stomach: Secondary | ICD-10-CM | POA: Insufficient documentation

## 2021-08-24 DIAGNOSIS — J439 Emphysema, unspecified: Secondary | ICD-10-CM | POA: Diagnosis not present

## 2021-08-24 DIAGNOSIS — I712 Thoracic aortic aneurysm, without rupture, unspecified: Secondary | ICD-10-CM | POA: Diagnosis not present

## 2021-08-24 DIAGNOSIS — R531 Weakness: Secondary | ICD-10-CM | POA: Diagnosis not present

## 2021-08-24 DIAGNOSIS — D735 Infarction of spleen: Secondary | ICD-10-CM | POA: Diagnosis not present

## 2021-08-24 DIAGNOSIS — C787 Secondary malignant neoplasm of liver and intrahepatic bile duct: Secondary | ICD-10-CM | POA: Diagnosis not present

## 2021-08-24 DIAGNOSIS — K6389 Other specified diseases of intestine: Secondary | ICD-10-CM | POA: Diagnosis not present

## 2021-08-24 DIAGNOSIS — R778 Other specified abnormalities of plasma proteins: Secondary | ICD-10-CM | POA: Diagnosis not present

## 2021-08-24 DIAGNOSIS — K5989 Other specified functional intestinal disorders: Secondary | ICD-10-CM | POA: Diagnosis not present

## 2021-08-24 IMAGING — CT CT CHEST-ABD-PELV W/ CM
2 of 5 series · 9 of 36 positions shown, 10 images · IV contrast (APPLIED)
Comparison: [DATE].
COMPARISON: [DATE].

Addendum:
CLINICAL DATA: Gastric cancer.  Staging.

EXAM:
CT CHEST, ABDOMEN, AND PELVIS WITH CONTRAST
TECHNIQUE: Multidetector CT imaging of the chest, abdomen and pelvis was
performed following the standard protocol during bolus
administration of intravenous contrast.

[Series 2: cap with · axial · 0.82mm/px · z∈[-512,+33]mm · 6 of 135 slices shown, 7 images]
[im 13/135  mediastinal]
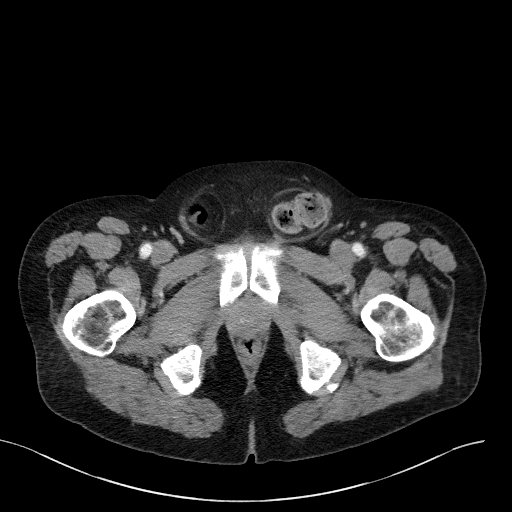
[im 13/135  bone]
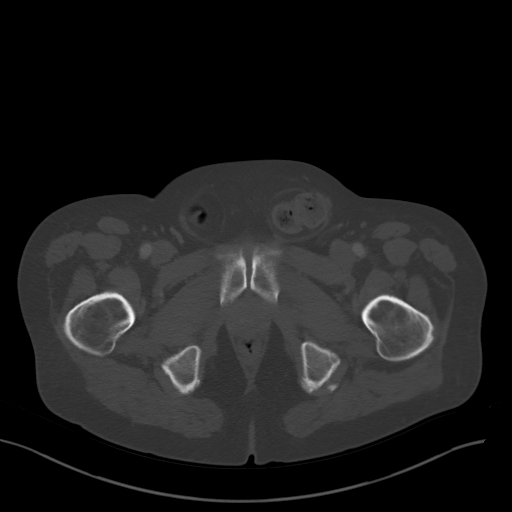
[im 37/135  mediastinal]
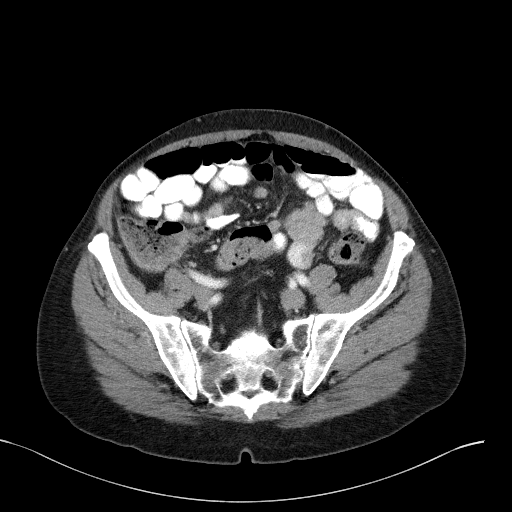
[im 61/135  mediastinal]
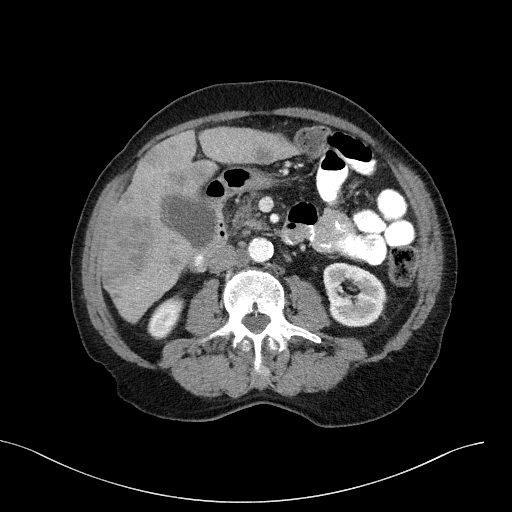
[im 74/135  mediastinal]
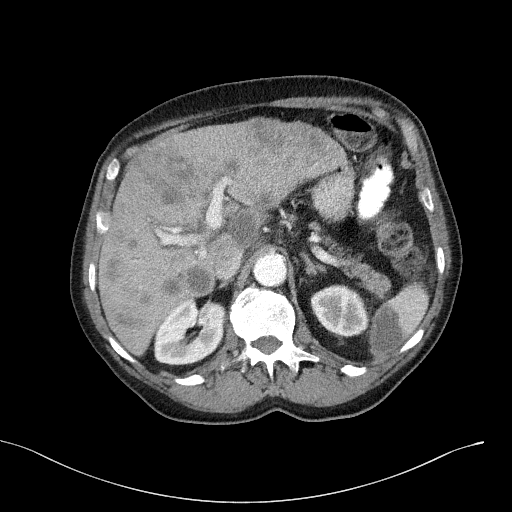
[im 98/135  mediastinal]
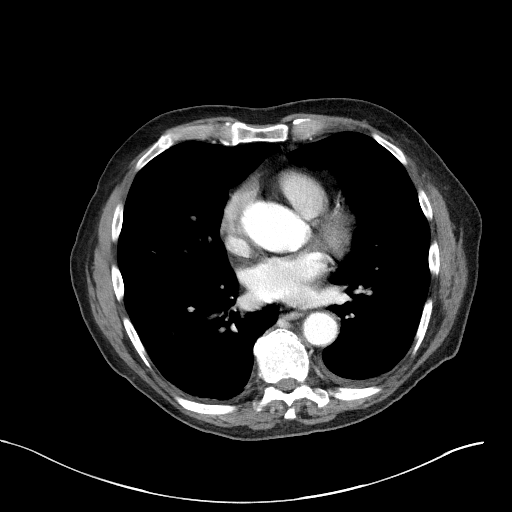
[im 122/135  mediastinal]
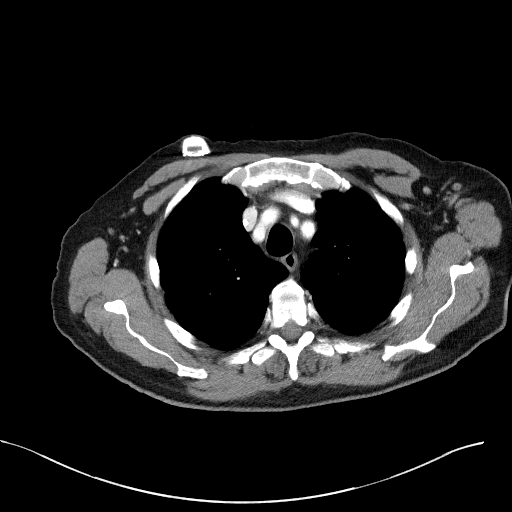

[Series 6: coronals · coronal · 0.75mm/px · 3 of 159 slices shown]
[im 32/159  mediastinal]
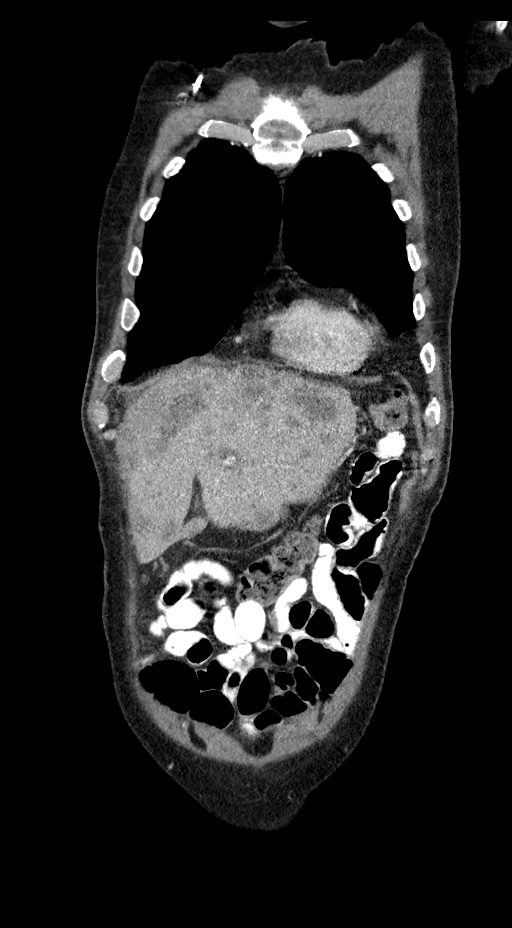
[im 64/159  mediastinal]
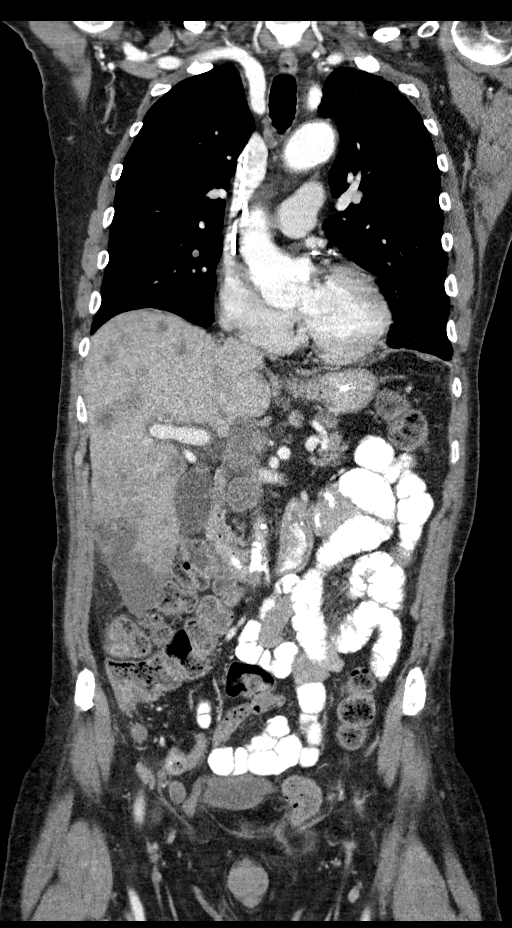
[im 95/159  mediastinal]
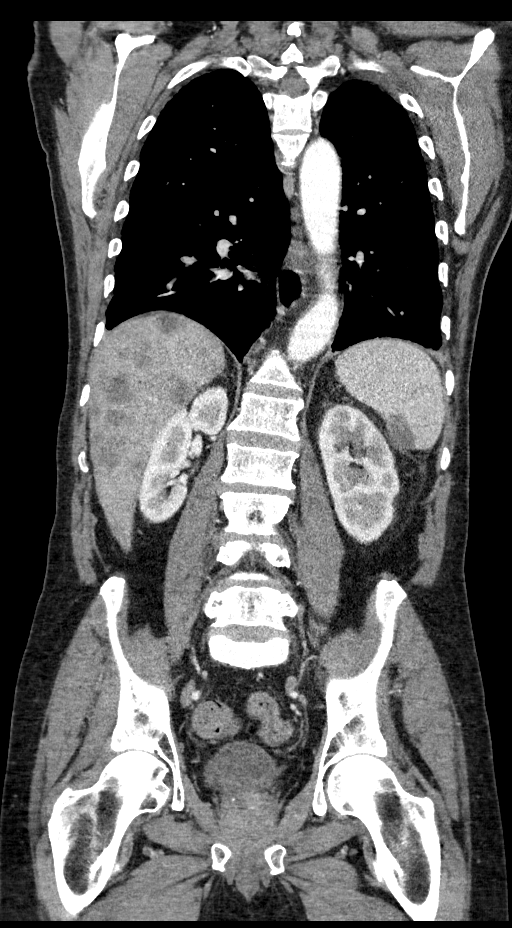

[9 of 36 positions shown; findings below may reference images not displayed]

RADIATION DOSE REDUCTION: This exam was performed according to the
departmental dose-optimization program which includes automated
exposure control, adjustment of the mA and/or kV according to
patient size and/or use of iterative reconstruction technique.

CONTRAST:  100mL OMNIPAQUE IOHEXOL 300 MG/ML  SOLN
FINDINGS: CT CHEST FINDINGS

Cardiovascular: The heart size is normal. No substantial pericardial
effusion. Coronary artery calcification is evident. Mild
atherosclerotic calcification is noted in the wall of the thoracic
aorta. Ascending thoracic aorta measures 4.5 cm diameter. Right
Port-A-Cath tip is positioned in the distal SVC.

Mediastinum/Nodes: 16 mm short axis precarinal node has increased
from 6 mm (remeasured) previously. 10 mm short axis right hilar
lymph node is mildly progressive in the interval. The esophagus has
normal imaging features. There is no axillary lymphadenopathy.

Lungs/Pleura: Biapical pleuroparenchymal scarring noted. Numerous
bilateral pulmonary nodules identified, similar to prior.

Index right lower pulmonary nodule measures 6 mm on image 72/5.

Index nodule posterior left lower lobe is 6 mm on 83/5.

Index right lower lobe nodule measuring 9 mm identified on image
90/5.

Index nodule left lower lobe on [DATE] measures 5 mm.

No focal consolidation.  Trace left pleural effusion evident.

Musculoskeletal: No worrisome lytic or sclerotic osseous
abnormality.

CT ABDOMEN PELVIS FINDINGS

Hepatobiliary: Innumerable hepatic metastases are again noted.

One of the more dominant lesions is identified in the lateral
segment left liver on image 67/2 measuring 4.8 x 2.7 cm today
compared to 4.3 x 2.5 cm when remeasured in a similar fashion on the
prior study.

Another dominant lesion identified in segment IV measures 5.0 x
cm on 59/2, clearly increased from 2.7 x 1.8 cm when remeasured on
the prior study.

3.0 cm posterior right hepatic lobe lesion on 57/2 was 2.2 cm
previously (remeasured).

2.5 cm lesion in the medial right liver adjacent to the IVC has
increased from 1.1 cm previously.

6.5 x 4.0 cm lesion inferior tip right liver (82/2 was measured on
the previous study at 4.8 x 3.1 cm.

There is no evidence for gallstones, gallbladder wall thickening, or
pericholecystic fluid. No intrahepatic or extrahepatic biliary
dilation.

Pancreas: No focal mass lesion. No dilatation of the main duct. No
intraparenchymal cyst. No peripancreatic edema.

Spleen: Small infarct noted inferior spleen. (Axial 62/2 and coronal
100/6).

Adrenals/Urinary Tract: No adrenal nodule or mass. Kidneys
unremarkable. No evidence for hydroureter. The urinary bladder
appears normal for the degree of distention.

Stomach/Bowel: Stomach is decompressed. Duodenum is normally
positioned as is the ligament of Treitz. No small bowel wall
thickening. No small bowel dilatation. The terminal ileum is normal.
The appendix is not well visualized, but there is no edema or
inflammation in the region of the cecum. No gross colonic mass. No
colonic wall thickening.

Vascular/Lymphatic: There is mild atherosclerotic calcification of
the abdominal aorta without aneurysm. Necrotic lymphadenopathy again
identified in the hepatoduodenal ligament. 3.2 x 2.1 cm node visible
on 63/2 was measured previously at 2.8 x 2.3 cm. 2.0 cm short axis
aortocaval node seen on 72/2. 13 mm short axis right retrocrural
node evident. No pelvic sidewall lymphadenopathy.

Reproductive: The prostate gland and seminal vesicles are
unremarkable.

Other: No intraperitoneal free fluid.

Musculoskeletal: Small right groin hernia contains small bowel
without complicating features. There is a left groin hernia
containing a short segment of the sigmoid colon without complicating
features. No worrisome lytic or sclerotic osseous abnormality.
IMPRESSION: 1. Mild interval progression of mediastinal and right hilar
lymphadenopathy.
2. Similar appearance of numerous bilateral pulmonary nodules
compatible with metastatic disease.
3. Interval progression of innumerable hepatic metastases.
4. Similar to mildly progressive necrotic lymphadenopathy in the
hepatoduodenal ligament and aortocaval space.
5. Small inferior splenic infarct, new since prior.
6. Bilateral groin hernias containing small bowel and colon,
respectively, without complicating features.
7. 4.5 cm ascending thoracic aortic aneurysm. Recommend semi-annual
imaging followup by CTA or MRA and referral to cardiothoracic
8. Aortic Atherosclerosis ([NE]-[NE]).

ADDENDUM:
Upper normal 10 mm short axis left thoracic inlet node on [DATE]. 11
mm short axis left supraclavicular node on image [DATE] is upper normal
to mildly enlarged.

*** End of Addendum ***
RADIATION DOSE REDUCTION: This exam was performed according to the
departmental dose-optimization program which includes automated
exposure control, adjustment of the mA and/or kV according to
patient size and/or use of iterative reconstruction technique.

CONTRAST:  100mL OMNIPAQUE IOHEXOL 300 MG/ML  SOLN
FINDINGS: CT CHEST FINDINGS

Cardiovascular: The heart size is normal. No substantial pericardial
effusion. Coronary artery calcification is evident. Mild
atherosclerotic calcification is noted in the wall of the thoracic
aorta. Ascending thoracic aorta measures 4.5 cm diameter. Right
Port-A-Cath tip is positioned in the distal SVC.

Mediastinum/Nodes: 16 mm short axis precarinal node has increased
from 6 mm (remeasured) previously. 10 mm short axis right hilar
lymph node is mildly progressive in the interval. The esophagus has
normal imaging features. There is no axillary lymphadenopathy.

Lungs/Pleura: Biapical pleuroparenchymal scarring noted. Numerous
bilateral pulmonary nodules identified, similar to prior.

Index right lower pulmonary nodule measures 6 mm on image 72/5.

Index nodule posterior left lower lobe is 6 mm on 83/5.

Index right lower lobe nodule measuring 9 mm identified on image
90/5.

Index nodule left lower lobe on [DATE] measures 5 mm.

No focal consolidation.  Trace left pleural effusion evident.

Musculoskeletal: No worrisome lytic or sclerotic osseous
abnormality.

CT ABDOMEN PELVIS FINDINGS

Hepatobiliary: Innumerable hepatic metastases are again noted.

One of the more dominant lesions is identified in the lateral
segment left liver on image 67/2 measuring 4.8 x 2.7 cm today
compared to 4.3 x 2.5 cm when remeasured in a similar fashion on the
prior study.

Another dominant lesion identified in segment IV measures 5.0 x
cm on 59/2, clearly increased from 2.7 x 1.8 cm when remeasured on
the prior study.

3.0 cm posterior right hepatic lobe lesion on 57/2 was 2.2 cm
previously (remeasured).

2.5 cm lesion in the medial right liver adjacent to the IVC has
increased from 1.1 cm previously.

6.5 x 4.0 cm lesion inferior tip right liver (82/2 was measured on
the previous study at 4.8 x 3.1 cm.

There is no evidence for gallstones, gallbladder wall thickening, or
pericholecystic fluid. No intrahepatic or extrahepatic biliary
dilation.

Pancreas: No focal mass lesion. No dilatation of the main duct. No
intraparenchymal cyst. No peripancreatic edema.

Spleen: Small infarct noted inferior spleen. (Axial 62/2 and coronal
100/6).

Adrenals/Urinary Tract: No adrenal nodule or mass. Kidneys
unremarkable. No evidence for hydroureter. The urinary bladder
appears normal for the degree of distention.

Stomach/Bowel: Stomach is decompressed. Duodenum is normally
positioned as is the ligament of Treitz. No small bowel wall
thickening. No small bowel dilatation. The terminal ileum is normal.
The appendix is not well visualized, but there is no edema or
inflammation in the region of the cecum. No gross colonic mass. No
colonic wall thickening.

Vascular/Lymphatic: There is mild atherosclerotic calcification of
the abdominal aorta without aneurysm. Necrotic lymphadenopathy again
identified in the hepatoduodenal ligament. 3.2 x 2.1 cm node visible
on 63/2 was measured previously at 2.8 x 2.3 cm. 2.0 cm short axis
aortocaval node seen on 72/2. 13 mm short axis right retrocrural
node evident. No pelvic sidewall lymphadenopathy.

Reproductive: The prostate gland and seminal vesicles are
unremarkable.

Other: No intraperitoneal free fluid.

Musculoskeletal: Small right groin hernia contains small bowel
without complicating features. There is a left groin hernia
containing a short segment of the sigmoid colon without complicating
features. No worrisome lytic or sclerotic osseous abnormality.
IMPRESSION: 1. Mild interval progression of mediastinal and right hilar
lymphadenopathy.
2. Similar appearance of numerous bilateral pulmonary nodules
compatible with metastatic disease.
3. Interval progression of innumerable hepatic metastases.
4. Similar to mildly progressive necrotic lymphadenopathy in the
hepatoduodenal ligament and aortocaval space.
5. Small inferior splenic infarct, new since prior.
6. Bilateral groin hernias containing small bowel and colon,
respectively, without complicating features.
7. 4.5 cm ascending thoracic aortic aneurysm. Recommend semi-annual
imaging followup by CTA or MRA and referral to cardiothoracic
8. Aortic Atherosclerosis ([NE]-[NE]).

## 2021-08-24 MED ORDER — IOHEXOL 300 MG/ML  SOLN
100.0000 mL | Freq: Once | INTRAMUSCULAR | Status: AC | PRN
  Administered 2021-08-24: 100 mL via INTRAVENOUS

## 2021-08-24 NOTE — Progress Notes (Addendum)
[place]   [phone] [phone]   Clinic Follow up Note   Patient Care Team: Biagio Borg, MD as PCP - General (Internal Medicine) Debara Pickett Nadean Corwin, MD as PCP - Cardiology (Cardiology) Lavonna Monarch, MD (Dermatology) Warren Danes, PA-C as Physician Assistant (Dermatology) Truitt Merle, MD as Consulting Physician (Oncology) Royston Bake, RN as Oncology Nurse Navigator (Oncology) 08/25/2021  CHIEF COMPLAINT: Follow up gastric cancer   SUMMARY OF ONCOLOGIC HISTORY: Oncology History  Gastric cancer ([place])  07/01/2021 Imaging   EXAM: CT ANGIOGRAPHY CHEST WITH CONTRAST  IMPRESSION: 1. No evidence of a pulmonary embolism. 2. Findings consistent with neoplastic disease. Multiple liver masses consistent with widespread hepatic metastatic disease. Ill-defined partly imaged mass along the porta hepatis suspicious for a pancreatic malignancy. There also multiple subcentimeter lung nodules consistent with metastatic disease. Recommend follow-up CT of the abdomen and pelvis with contrast for further assessment 3. No acute findings in the chest. 4. Dilated ascending thoracic aorta to 4.7 cm. Ascending thoracic aortic aneurysm. Recommend semi-annual imaging followup by CTA or MRA and referral to cardiothoracic surgery if not already obtained. This recommendation follows 2010 ACCF/AHA/AATS/ACR/ASA/SCA/SCAI/SIR/STS/SVM Guidelines for the Diagnosis and Management of Patients With Thoracic Aortic Disease. Circulation. 2010; 121: A355-D322. Aortic aneurysm NOS (ICD10-I71.9)     07/01/2021 Imaging   EXAM: CT ABDOMEN AND PELVIS WITH CONTRAST  IMPRESSION: 1. Numerous hypodense hepatic lesions with dominant lesion in the inferior right lobe measuring up to 5.9 cm. Findings worrisome for diffuse metastatic disease. Primary hepatic neoplasm not excluded in the inferior right lobe. 2. Pathologically enlarged periportal, portal caval, peripancreatic and  aortocaval lymph nodes worrisome for metastatic disease. 3. Bilateral inguinal hernias. Left inguinal hernia contains colon. No bowel obstruction. 4.  Aortic Atherosclerosis (ICD10-I70.0).   07/12/2021 Pathology Results   FINAL MICROSCOPIC DIAGNOSIS:   A. LEFT LIVER MASS, NEEDLE CORE BIOPSY:  Metastatic adenocarcinoma.  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   Comment: The morphologic features of the neoplasm are most consistent with metastasis from a primary colorectal adenocarcinoma.  However, possibility of primary in the upper GI tract and pancreaticobiliary tree cannot be excluded.  Markers for lung adenocarcinoma are negative.   ADDENDUM:  HER2 by immunohistochemistry is POSITIVE (3+).  ADDENDUM:  Mismatch Repair Protein (IHC)  SUMMARY INTERPRETATION: NORMAL   07/19/2021 Procedure   Upper Endoscopy, Dr. Fuller Plan  Impression: - Normal esophagus. - Malignant gastric tumor in the gastric fundus. Biopsied. - Normal duodenal bulb and second portion of the duodenum.   07/19/2021 Pathology Results   Diagnosis Stomach, biopsy, Mass - INVASIVE MODERATE TO POORLY DIFFERENTIATED ADENOCARCINOMA,   07/21/2021 Initial Diagnosis   Gastric cancer ([place])   08/11/2021 -  Chemotherapy   Patient is on Treatment Plan : GASTROESOPHAGEAL FOLFOX q14d x 6 cycles     08/18/2021 -  Chemotherapy   Patient is on Treatment Plan : Gastric - Herceptin & Keytruda Q 21 days     08/24/2021 Imaging   New baseline CT CAP IMPRESSION: 1. Mild interval progression of mediastinal and right hilar lymphadenopathy. 2. Similar appearance of numerous bilateral pulmonary nodules compatible with metastatic disease. 3. Interval progression of innumerable hepatic metastases. 4. Similar to mildly progressive necrotic lymphadenopathy in the hepatoduodenal ligament and aortocaval space. 5. Small inferior splenic infarct, new since prior. 6. Bilateral groin hernias containing small bowel and colon,  respectively, without complicating features. 7. 4.5 cm ascending thoracic aortic aneurysm. Recommend semi-annual imaging followup by CTA or MRA and referral to  cardiothoracic surgery if not already obtained.    ADDENDUM: Upper normal 10 mm short axis left thoracic inlet node on 04/02. 11 mm short axis left supraclavicular node on image 3/2 is upper normal to mildly enlarged.     CURRENT THERAPY: FOLFOX q2 weeks starting 08/11/21, trastuzumab and pembrolizumab q. 21 days starting 08/18/2021.  Plan to switch FOLFOX to Capox after 2 cycles  INTERVAL HISTORY: Mr. Vi returns for follow up and treatment as scheduled. He completed C1 FOLFOX 08/11/21 and first trastuzumab and pembrolizumab on 08/18/21.  Once he got home a few hours later, he had an episode of shaking chills, shortness of breath, and T-max 101.5.  He called triage line who recommended to call 911, they came to his home, provided oxygen and management until he resolved to baseline.  One of our nurses spoke to him as well, he denied further episodes except some night sweats and was feeling better.  He has noticed a faint rash on his abdomen, does not itch.  He slept wrong and has a "crick in his neck" and pain in the right shoulder.  He also has right upper quadrant pain on deep inspiration, rates it a 3-5 out of 10.  Still controlled with MS Contin twice daily, he has not taken hydrocodone or oxycodone.  Bowels moving well on stool regimen, Remeron is helping his sleep.  He feels tired by the end of the day, but up at home.  Denies nausea/vomiting.  Denies new cough, chest pain, dyspnea, leg edema, neuropathy.  He underwent CT CAP yesterday for new baseline. He presents for C2 FOLFOX.    MEDICAL HISTORY:  Past Medical History:  Diagnosis Date   Basal cell carcinoma 05/29/2012   left thigh-(EXC)   Basal cell carcinoma 10/23/2012   right side of nose (MOHS)   Basal cell carcinoma 12/12/2012   nod-irght cheek (MOHS)   Basal cell carcinoma  05/26/2015   nod-left calf (CX35FU)   Basal cell carcinoma 05/26/2015   nod-left nose (txpbx)   Basal cell carcinoma 06/08/2016   nod-above right brow (CX35FU)   Basal cell carcinoma 09/12/2016   left nose (MOHS)   Basal cell carcinoma 01/29/2019   nod-right tip of nose (MOHS)   Benign neoplasm of colon    Finger fracture    Hx of colonoscopy    Hyperlipidemia    Hypertension    Internal hemorrhoids without mention of complication    Myocardial infarction ([place])    09/01/2015   Plantar fascial fibromatosis    Prostatitis    Rib fracture    Squamous cell carcinoma of skin 07/13/2009   RIght lower outer leg   Squamous cell carcinoma of skin 02/17/2014   in situ-right temple (txpbx)   Squamous cell carcinoma of skin 01/24/2018   in situ-right chest (txpbx)   Squamous cell carcinoma of skin 01/24/2018   in situ0 right forehead (txpbx)   Squamous cell carcinoma of skin     SURGICAL HISTORY: Past Surgical History:  Procedure Laterality Date   cardiac stint  2017   COLONOSCOPY  2016   IR IMAGING GUIDED PORT INSERTION  07/12/2021   IR US GUIDE BX ASP/DRAIN  07/12/2021   POLYPECTOMY     TONSILLECTOMY     VARICOCELECTOMY     Left    I have reviewed the social history and family history with the patient and they are unchanged from previous note.  ALLERGIES:  is allergic to ofloxacin.  MEDICATIONS:  Current Outpatient Medications  Medication Sig Dispense Refill   ALPRAZolam (XANAX) 0.5 MG tablet Take 1 tablet (0.5 mg total) by mouth 2 (two) times daily as needed for anxiety. 60 tablet 0   aspirin EC 81 MG tablet Take 81 mg by mouth daily.     atorvastatin (LIPITOR) 80 MG tablet Take 1 tablet (80 mg total) by mouth daily at 6 PM. 90 tablet 3   capecitabine (XELODA) 500 MG tablet Take 3 tablets every 12 hours, for 14 days then off for 7 days. Take after meal. 84 tablet 0   Cholecalciferol (VITAMIN D3 PO) Take 1 tablet by mouth daily.     HYDROcodone-acetaminophen (NORCO) 5-325  MG tablet Take 1 tablet by mouth every 4 (four) hours as needed for moderate pain. 60 tablet 0   lidocaine-prilocaine (EMLA) cream Apply 1 application topically as needed. Apply 0.33gm to PortaCath site 30 to 60 mins prior to PortaCath access. 30 g 2   lisinopril (ZESTRIL) 2.5 MG tablet Take 1 tablet (2.5 mg total) by mouth daily. 90 tablet 3   mirtazapine (REMERON) 7.5 MG tablet Take 1 tablet (7.5 mg total) by mouth at bedtime. 30 tablet 1   morphine (MS CONTIN) 15 MG 12 hr tablet Take 1 tablet (15 mg total) by mouth every 12 (twelve) hours. 60 tablet 0   nitroGLYCERIN (NITROSTAT) 0.4 MG SL tablet Place 1 tablet (0.4 mg total) under the tongue every 5 (five) minutes as needed for chest pain. Max 3 doses. 25 tablet 3   Omega-3 Fatty Acids (FISH OIL) 500 MG CAPS Take 500 mg by mouth daily.     ondansetron (ZOFRAN) 8 MG tablet Take 1 tablet (8 mg total) by mouth every 8 (eight) hours as needed for nausea or vomiting. 30 tablet 3   oxyCODONE (OXY IR/ROXICODONE) 5 MG immediate release tablet Take 1 tablet (5 mg total) by mouth every 4 (four) hours as needed for severe pain. 30 tablet 0   prochlorperazine (COMPAZINE) 10 MG tablet Take 1 tablet (10 mg total) by mouth every 6 (six) hours as needed for nausea or vomiting. 30 tablet 3   traMADol (ULTRAM) 50 MG tablet Take 1 tablet (50 mg total) by mouth every 6 (six) hours as needed. 30 tablet 0   No current facility-administered medications for this visit.   Facility-Administered Medications Ordered in Other Visits  Medication Dose Route Frequency Provider Last Rate Last Admin   fluorouracil (ADRUCIL) 4,250 mg in sodium chloride 0.9 % 65 mL chemo infusion  2,000 mg/m2 (Treatment Plan Recorded) Intravenous 1 day or 1 dose Truitt Merle, MD       leucovorin 852 mg in dextrose 5 % 250 mL infusion  400 mg/m2 (Treatment Plan Recorded) Intravenous Once Truitt Merle, MD 146 mL/hr at 08/25/21 1136 852 mg at 08/25/21 1136   oxaliplatin (ELOXATIN) 180 mg in dextrose 5 %  500 mL chemo infusion  85 mg/m2 (Treatment Plan Recorded) Intravenous Once Truitt Merle, MD 268 mL/hr at 08/25/21 1132 180 mg at 08/25/21 1132   sodium chloride flush (NS) 0.9 % injection 10 mL  10 mL Intracatheter PRN Truitt Merle, MD        PHYSICAL EXAMINATION: ECOG PERFORMANCE STATUS: 1 - Symptomatic but completely ambulatory  Vitals:   08/25/21 0908  BP: 100/64  Pulse: 69  Resp: 20  Temp: 98 F (36.7 C)  SpO2: 94%   Filed Weights   08/25/21 0908  Weight: 184 lb 4.8 oz (83.6 kg)    GENERAL:alert, no distress and comfortable SKIN:  Faint mild maculopapular rash over the right/upper abdomen and upper back EYES: sclera clear NECK: Palpable left cervical/supraclavicular lymph node similar size, softer LYMPH:  no palpable axillary lymphadenopathy LUNGS: clear with normal breathing effort HEART: regular rate & rhythm, no lower extremity edema ABDOMEN:abdomen soft, non-tender and normal bowel sounds.  Fullness over the right upper quadrant Musculoskeletal no focal spinal or scapular tenderness NEURO: alert & oriented x 3 with fluent speech, no focal motor/sensory deficits PAC without erythema  LABORATORY DATA:  I have reviewed the data as listed CBC Latest Ref Rng & Units 08/25/2021 08/18/2021 08/10/2021  WBC 4.0 - 10.5 K/uL 12.2(H) 16.9(H) 14.9(H)  Hemoglobin 13.0 - 17.0 g/dL 10.4(L) 9.8(L) 11.2(L)  Hematocrit 39.0 - 52.0 % 31.5(L) 30.1(L) 34.3(L)  Platelets 150 - 400 K/uL 358 285 297     CMP Latest Ref Rng & Units 08/25/2021 08/18/2021 08/10/2021  Glucose 70 - 99 mg/dL 111(H) 116(H) 108(H)  BUN 8 - 23 mg/dL _0 Creatinine 0.61 - 1.24 mg/dL 0.98 1.00 0.96  Sodium 135 - 145 mmol/L 136 131(L) 135  Potassium 3.5 - 5.1 mmol/L 4.2 4.4 4.3  Chloride 98 - 111 mmol/L 103 97(L) 100  CO2 22 - 32 mmol/L _1 Calcium 8.9 - 10.3 mg/dL 8.8(L) 8.4(L) 8.8(L)  Total Protein 6.5 - 8.1 g/dL 6.3(L) 6.2(L) 6.7  Total Bilirubin 0.3 - 1.2 mg/dL 0.7 1.0 1.0  Alkaline Phos 38 - 126 U/L 415(H)  507(H) 567(H)  AST 15 - 41 U/L 88(H) 123(H) 200(H)  ALT 0 - 44 U/L 59(H) 69(H) 117(H)      RADIOGRAPHIC STUDIES: I have personally reviewed the radiological images as listed and agreed with the findings in the report. CT CHEST ABDOMEN PELVIS W CONTRAST  Addendum Date: 08/25/2021   ADDENDUM REPORT: 08/25/2021 11:30 ADDENDUM: Upper normal 10 mm short axis left thoracic inlet node on 04/02. 11 mm short axis left supraclavicular node on image 3/2 is upper normal to mildly enlarged. Electronically Signed   By: Misty Stanley M.D.   On: 08/25/2021 11:30   Result Date: 08/25/2021 CLINICAL DATA:  Gastric cancer.  Staging. EXAM: CT CHEST, ABDOMEN, AND PELVIS WITH CONTRAST TECHNIQUE: Multidetector CT imaging of the chest, abdomen and pelvis was performed following the standard protocol during bolus administration of intravenous contrast. RADIATION DOSE REDUCTION: This exam was performed according to the departmental dose-optimization program which includes automated exposure control, adjustment of the mA and/or kV according to patient size and/or use of iterative reconstruction technique. CONTRAST:  164m OMNIPAQUE IOHEXOL 300 MG/ML  SOLN COMPARISON:  07/01/2021. FINDINGS: CT CHEST FINDINGS Cardiovascular: The heart size is normal. No substantial pericardial effusion. Coronary artery calcification is evident. Mild atherosclerotic calcification is noted in the wall of the thoracic aorta. Ascending thoracic aorta measures 4.5 cm diameter. Right Port-A-Cath tip is positioned in the distal SVC. Mediastinum/Nodes: 16 mm short axis precarinal node has increased from 6 mm (remeasured) previously. 10 mm short axis right hilar lymph node is mildly progressive in the interval. The esophagus has normal imaging features. There is no axillary lymphadenopathy. Lungs/Pleura: Biapical pleuroparenchymal scarring noted. Numerous bilateral pulmonary nodules identified, similar to prior. Index right lower pulmonary nodule measures 6  mm on image 72/5. Index nodule posterior left lower lobe is 6 mm on 83/5. Index right lower lobe nodule measuring 9 mm identified on image 90/5. Index nodule left lower lobe on 01/12/5 measures 5 mm. No focal consolidation.  Trace left pleural effusion evident. Musculoskeletal: No worrisome lytic or  sclerotic osseous abnormality. CT ABDOMEN PELVIS FINDINGS Hepatobiliary: Innumerable hepatic metastases are again noted. One of the more dominant lesions is identified in the lateral segment left liver on image 67/2 measuring 4.8 x 2.7 cm today compared to 4.3 x 2.5 cm when remeasured in a similar fashion on the prior study. Another dominant lesion identified in segment IV measures 5.0 x 4.1 cm on 59/2, clearly increased from 2.7 x 1.8 cm when remeasured on the prior study. 3.0 cm posterior right hepatic lobe lesion on 57/2 was 2.2 cm previously (remeasured). 2.5 cm lesion in the medial right liver adjacent to the IVC has increased from 1.1 cm previously. 6.5 x 4.0 cm lesion inferior tip right liver (82/2 was measured on the previous study at 4.8 x 3.1 cm. There is no evidence for gallstones, gallbladder wall thickening, or pericholecystic fluid. No intrahepatic or extrahepatic biliary dilation. Pancreas: No focal mass lesion. No dilatation of the main duct. No intraparenchymal cyst. No peripancreatic edema. Spleen: Small infarct noted inferior spleen. (Axial 62/2 and coronal 100/6). Adrenals/Urinary Tract: No adrenal nodule or mass. Kidneys unremarkable. No evidence for hydroureter. The urinary bladder appears normal for the degree of distention. Stomach/Bowel: Stomach is decompressed. Duodenum is normally positioned as is the ligament of Treitz. No small bowel wall thickening. No small bowel dilatation. The terminal ileum is normal. The appendix is not well visualized, but there is no edema or inflammation in the region of the cecum. No gross colonic mass. No colonic wall thickening. Vascular/Lymphatic: There is mild  atherosclerotic calcification of the abdominal aorta without aneurysm. Necrotic lymphadenopathy again identified in the hepatoduodenal ligament. 3.2 x 2.1 cm node visible on 63/2 was measured previously at 2.8 x 2.3 cm. 2.0 cm short axis aortocaval node seen on 72/2. 13 mm short axis right retrocrural node evident. No pelvic sidewall lymphadenopathy. Reproductive: The prostate gland and seminal vesicles are unremarkable. Other: No intraperitoneal free fluid. Musculoskeletal: Small right groin hernia contains small bowel without complicating features. There is a left groin hernia containing a short segment of the sigmoid colon without complicating features. No worrisome lytic or sclerotic osseous abnormality. IMPRESSION: 1. Mild interval progression of mediastinal and right hilar lymphadenopathy. 2. Similar appearance of numerous bilateral pulmonary nodules compatible with metastatic disease. 3. Interval progression of innumerable hepatic metastases. 4. Similar to mildly progressive necrotic lymphadenopathy in the hepatoduodenal ligament and aortocaval space. 5. Small inferior splenic infarct, new since prior. 6. Bilateral groin hernias containing small bowel and colon, respectively, without complicating features. 7. 4.5 cm ascending thoracic aortic aneurysm. Recommend semi-annual imaging followup by CTA or MRA and referral to cardiothoracic surgery if not already obtained. 2010; 121: [id number]. 8. Aortic Atherosclerosis (ICD10-I70.0). Electronically Signed: By: Misty Stanley M.D. On: 08/25/2021 10:01     ASSESSMENT & PLAN: Hikeem Andersson Wentz is a 81 y.o. male with    1. Metastatic gastric adenocarcinoma to liver, nodes, and possible lungs, [id number], stage IV,MMR normal, HER2 (+) -presented with epigastric pain. CT CAP on 07/01/21 showed multiple hepatic lesions measuring up to 5.9 cm and pathologically enlarged abdominal lymph nodes, and multiple small lung nodules up to 18m. -liver biopsy on 07/12/21 showed  metastatic adenocarcinoma, most consistent with primary colorectal or up GI -upper endoscopy on 07/19/21 by Dr. SFuller Planshowed a 4 cm gastric fundus mass. Pathology confirmed invasive moderately to poorly differentiated adenocarcinoma. Additional testing showed Her2+ and MMR normal. -Given his Her2 positivity, Dr. FBurr Medicorecommend adding trastuzumab and Keytruda to his treatment, based on NCCN guideline. Baseline  echo with normal EF 60-65%. -He began FOLFOX 08/11/2021, tolerated first week well with mild fatigue.  -He started trastuzumab and pembrolizumab on 08/18/2021 -To synchronize his treatment, the plan is to change to q3 week Capox with cycle 3 chemo, and continue trastuzumab and pembrolizumab every 3 weeks.  We again reviewed in detail today, patient agrees. -The patient has a palpable 1-2 cm left cervical/supraclavicular node to follow on treatment, slightly softer today -We obtained new baseline CT CAP for monitoring on treatment, the report was not available during my visit but I reviewed the images with the patient on the screen today.  Since last imaging on 07/01/2021, there has been interval progression of his hepatic and nodal metastatic disease, bilateral pulmonary nodules are stable. -Mr. Veltri appears stable.  He tolerated first cycle dose-reduced FOLFOX well overall, without significant side effects.   -He had a possible hypersensitivity reaction to pembrolizumab and trastuzumab last week, few hours after the infusion completed he developed shaking chills, temp, and dyspnea.  EMS called to his house where he was managed with oxygen and supportive care until he resolved to baseline.  He had night sweats thereafter but no recurrent episodes. He also has a mild rash to abdomen and upper back, not itching. He can use topical benadryl if needed. Will avoid steroids if this is immune-mediated. Ok to monitor for now. -Side effects are well managed with supportive care at home, he has recovered well.   We reviewed symptom management for his right upper quadrant pain and other SE's. -Labs adequate to proceed with cycle 2 FOLFOX today as planned, will increase oxaliplatin to full dose given his good tolerance -He will return for follow-up in 2 weeks, at which time we will switch FOLFOX to Capox and proceed with cycle 2 pembrolizumab/trastuzumab with additional premeds.  Denies not to start Xeloda until we see him in 2 weeks   2. Abdominal Pain -Tramadol was not adequate for his pain control, changed to hydrocodone, he is taking 5 to 6 tablets a day, pain is controlled -Since he is starting chemotherapy, hydrocodone was changed to oxycodone to avoid excessive Tylenol -Since starting MS Contin twice daily (08/12/2021) his pain has essentially resolved, not required hydrocodone or oxycodone since then -He has increased RUQ pain in the last few days, secondary to liver metastasis - still adequately managed with MS Contin twice daily.  We reviewed symptom management with breakthrough pain meds.   3. Goal of care discussion, partial DNR, Social Support -He understands the incurable nature of his cancer, and the overall poor prognosis, especially if he does not have good response to chemotherapy or progress on chemo -The patient understands the goal of care is palliative. -they have two children that live here in town. -he has a living well. He does not want to be on life support if he is terminal.  -Currently on full code, he is willing to change DNR when his cancer progress further and he has no or limited treatment options    4. Anxiety and insomnia  -he has xanax as needed  -Mirtazapine has been helpful, refilled  PLAN: -labs, CT reviewed -proceed with cycle 2 FOLFOX today as planned, increase oxali to full dose -continue pain regimen, reviewed Symptom management -Refill MS contin, mirtazapine -F/up in 2 weeks to switch to CAPOX and continue pembrolizumab and trastuzumab. Patient will bring  xeloda to start at clinic visit   -add premeds to pembro in 2 weeks, monitor rash -avoid steroids -plan reviewed with  Dr. Burr Medico   All questions were answered. The patient knows to call the clinic with any problems, questions or concerns. No barriers to learning was detected. I spent 20 minutes counseling the patient face to face. The total time spent in the appointment was 40 minutes and more than 50% was on counseling and review of test results     Alla Feeling, NP 08/25/21

## 2021-08-25 ENCOUNTER — Encounter: Payer: Self-pay | Admitting: Nurse Practitioner

## 2021-08-25 ENCOUNTER — Emergency Department (HOSPITAL_COMMUNITY): Payer: Medicare HMO

## 2021-08-25 ENCOUNTER — Encounter (HOSPITAL_COMMUNITY): Payer: Self-pay | Admitting: Emergency Medicine

## 2021-08-25 ENCOUNTER — Other Ambulatory Visit: Payer: Self-pay

## 2021-08-25 ENCOUNTER — Inpatient Hospital Stay: Payer: Medicare HMO | Admitting: Nutrition

## 2021-08-25 ENCOUNTER — Inpatient Hospital Stay (HOSPITAL_COMMUNITY)
Admission: EM | Admit: 2021-08-25 | Discharge: 2021-09-03 | DRG: 175 | Disposition: A | Discharge status: Home Health | Payer: Medicare HMO | Attending: Internal Medicine | Admitting: Internal Medicine

## 2021-08-25 ENCOUNTER — Inpatient Hospital Stay: Payer: Medicare HMO

## 2021-08-25 ENCOUNTER — Inpatient Hospital Stay: Payer: Medicare HMO | Admitting: Nurse Practitioner

## 2021-08-25 VITALS — BP 100/64 | HR 69 | Temp 98.0°F | Resp 20 | Wt 184.3 lb

## 2021-08-25 DIAGNOSIS — R531 Weakness: Secondary | ICD-10-CM | POA: Diagnosis not present

## 2021-08-25 DIAGNOSIS — R9431 Abnormal electrocardiogram [ECG] [EKG]: Secondary | ICD-10-CM | POA: Diagnosis not present

## 2021-08-25 DIAGNOSIS — C169 Malignant neoplasm of stomach, unspecified: Secondary | ICD-10-CM | POA: Diagnosis present

## 2021-08-25 DIAGNOSIS — N419 Inflammatory disease of prostate, unspecified: Secondary | ICD-10-CM | POA: Diagnosis present

## 2021-08-25 DIAGNOSIS — Z95828 Presence of other vascular implants and grafts: Secondary | ICD-10-CM | POA: Insufficient documentation

## 2021-08-25 DIAGNOSIS — Z8249 Family history of ischemic heart disease and other diseases of the circulatory system: Secondary | ICD-10-CM

## 2021-08-25 DIAGNOSIS — R778 Other specified abnormalities of plasma proteins: Secondary | ICD-10-CM | POA: Diagnosis present

## 2021-08-25 DIAGNOSIS — I2699 Other pulmonary embolism without acute cor pulmonale: Secondary | ICD-10-CM | POA: Diagnosis not present

## 2021-08-25 DIAGNOSIS — R911 Solitary pulmonary nodule: Secondary | ICD-10-CM | POA: Diagnosis not present

## 2021-08-25 DIAGNOSIS — C779 Secondary and unspecified malignant neoplasm of lymph node, unspecified: Secondary | ICD-10-CM | POA: Diagnosis present

## 2021-08-25 DIAGNOSIS — Z9221 Personal history of antineoplastic chemotherapy: Secondary | ICD-10-CM

## 2021-08-25 DIAGNOSIS — Z85828 Personal history of other malignant neoplasm of skin: Secondary | ICD-10-CM

## 2021-08-25 DIAGNOSIS — R627 Adult failure to thrive: Secondary | ICD-10-CM | POA: Diagnosis present

## 2021-08-25 DIAGNOSIS — C161 Malignant neoplasm of fundus of stomach: Secondary | ICD-10-CM

## 2021-08-25 DIAGNOSIS — R570 Cardiogenic shock: Secondary | ICD-10-CM | POA: Diagnosis not present

## 2021-08-25 DIAGNOSIS — Z7982 Long term (current) use of aspirin: Secondary | ICD-10-CM

## 2021-08-25 DIAGNOSIS — R579 Shock, unspecified: Secondary | ICD-10-CM | POA: Diagnosis present

## 2021-08-25 DIAGNOSIS — Z86010 Personal history of colonic polyps: Secondary | ICD-10-CM

## 2021-08-25 DIAGNOSIS — Z87891 Personal history of nicotine dependence: Secondary | ICD-10-CM

## 2021-08-25 DIAGNOSIS — Z823 Family history of stroke: Secondary | ICD-10-CM

## 2021-08-25 DIAGNOSIS — R58 Hemorrhage, not elsewhere classified: Secondary | ICD-10-CM | POA: Diagnosis present

## 2021-08-25 DIAGNOSIS — I82431 Acute embolism and thrombosis of right popliteal vein: Secondary | ICD-10-CM | POA: Diagnosis present

## 2021-08-25 DIAGNOSIS — Y848 Other medical procedures as the cause of abnormal reaction of the patient, or of later complication, without mention of misadventure at the time of the procedure: Secondary | ICD-10-CM | POA: Diagnosis not present

## 2021-08-25 DIAGNOSIS — E782 Mixed hyperlipidemia: Secondary | ICD-10-CM | POA: Diagnosis present

## 2021-08-25 DIAGNOSIS — I10 Essential (primary) hypertension: Secondary | ICD-10-CM | POA: Diagnosis present

## 2021-08-25 DIAGNOSIS — R5081 Fever presenting with conditions classified elsewhere: Secondary | ICD-10-CM | POA: Diagnosis present

## 2021-08-25 DIAGNOSIS — I252 Old myocardial infarction: Secondary | ICD-10-CM

## 2021-08-25 DIAGNOSIS — S7011XA Contusion of right thigh, initial encounter: Secondary | ICD-10-CM | POA: Diagnosis not present

## 2021-08-25 DIAGNOSIS — J9601 Acute respiratory failure with hypoxia: Secondary | ICD-10-CM | POA: Diagnosis present

## 2021-08-25 DIAGNOSIS — Z6824 Body mass index (BMI) 24.0-24.9, adult: Secondary | ICD-10-CM

## 2021-08-25 DIAGNOSIS — Z20822 Contact with and (suspected) exposure to covid-19: Secondary | ICD-10-CM | POA: Diagnosis present

## 2021-08-25 DIAGNOSIS — J439 Emphysema, unspecified: Secondary | ICD-10-CM | POA: Diagnosis not present

## 2021-08-25 DIAGNOSIS — D72829 Elevated white blood cell count, unspecified: Secondary | ICD-10-CM | POA: Diagnosis present

## 2021-08-25 DIAGNOSIS — R2681 Unsteadiness on feet: Secondary | ICD-10-CM

## 2021-08-25 DIAGNOSIS — S7010XA Contusion of unspecified thigh, initial encounter: Secondary | ICD-10-CM | POA: Diagnosis not present

## 2021-08-25 DIAGNOSIS — E872 Acidosis, unspecified: Secondary | ICD-10-CM | POA: Diagnosis present

## 2021-08-25 DIAGNOSIS — I2510 Atherosclerotic heart disease of native coronary artery without angina pectoris: Secondary | ICD-10-CM | POA: Diagnosis present

## 2021-08-25 DIAGNOSIS — Z79899 Other long term (current) drug therapy: Secondary | ICD-10-CM

## 2021-08-25 DIAGNOSIS — C787 Secondary malignant neoplasm of liver and intrahepatic bile duct: Secondary | ICD-10-CM | POA: Diagnosis present

## 2021-08-25 DIAGNOSIS — M109 Gout, unspecified: Secondary | ICD-10-CM | POA: Diagnosis present

## 2021-08-25 DIAGNOSIS — Z66 Do not resuscitate: Secondary | ICD-10-CM | POA: Diagnosis present

## 2021-08-25 DIAGNOSIS — C799 Secondary malignant neoplasm of unspecified site: Secondary | ICD-10-CM

## 2021-08-25 DIAGNOSIS — D62 Acute posthemorrhagic anemia: Secondary | ICD-10-CM | POA: Diagnosis not present

## 2021-08-25 DIAGNOSIS — Z9861 Coronary angioplasty status: Secondary | ICD-10-CM

## 2021-08-25 DIAGNOSIS — R509 Fever, unspecified: Secondary | ICD-10-CM | POA: Diagnosis not present

## 2021-08-25 DIAGNOSIS — R0602 Shortness of breath: Secondary | ICD-10-CM

## 2021-08-25 DIAGNOSIS — R29898 Other symptoms and signs involving the musculoskeletal system: Secondary | ICD-10-CM

## 2021-08-25 DIAGNOSIS — R21 Rash and other nonspecific skin eruption: Secondary | ICD-10-CM | POA: Diagnosis present

## 2021-08-25 DIAGNOSIS — G589 Mononeuropathy, unspecified: Secondary | ICD-10-CM | POA: Diagnosis present

## 2021-08-25 DIAGNOSIS — C7800 Secondary malignant neoplasm of unspecified lung: Secondary | ICD-10-CM | POA: Diagnosis present

## 2021-08-25 DIAGNOSIS — M96841 Postprocedural hematoma of a musculoskeletal structure following other procedure: Secondary | ICD-10-CM | POA: Diagnosis not present

## 2021-08-25 DIAGNOSIS — D709 Neutropenia, unspecified: Secondary | ICD-10-CM | POA: Diagnosis present

## 2021-08-25 DIAGNOSIS — I2693 Single subsegmental pulmonary embolism without acute cor pulmonale: Principal | ICD-10-CM | POA: Diagnosis present

## 2021-08-25 DIAGNOSIS — I7121 Aneurysm of the ascending aorta, without rupture: Secondary | ICD-10-CM | POA: Diagnosis not present

## 2021-08-25 LAB — COMPREHENSIVE METABOLIC PANEL
ALT: 59 U/L — ABNORMAL HIGH (ref 0–44)
ALT: 71 U/L — ABNORMAL HIGH (ref 0–44)
AST: 88 U/L — ABNORMAL HIGH (ref 15–41)
AST: 116 U/L — ABNORMAL HIGH (ref 15–41)
Albumin: 3.2 g/dL — ABNORMAL LOW (ref 3.5–5.0)
Albumin: 2.7 g/dL — ABNORMAL LOW (ref 3.5–5.0)
Alkaline Phosphatase: 415 U/L — ABNORMAL HIGH (ref 38–126)
Alkaline Phosphatase: 511 U/L — ABNORMAL HIGH (ref 38–126)
Anion gap: 8 (ref 5–15)
Anion gap: 10 (ref 5–15)
BUN: 18 mg/dL (ref 8–23)
BUN: 22 mg/dL (ref 8–23)
CO2: 25 mmol/L (ref 22–32)
CO2: 21 mmol/L — ABNORMAL LOW (ref 22–32)
Calcium: 8.8 mg/dL — ABNORMAL LOW (ref 8.9–10.3)
Calcium: 8.5 mg/dL — ABNORMAL LOW (ref 8.9–10.3)
Chloride: 103 mmol/L (ref 98–111)
Chloride: 104 mmol/L (ref 98–111)
Creatinine, Ser: 0.98 mg/dL (ref 0.61–1.24)
Creatinine, Ser: 1.06 mg/dL (ref 0.61–1.24)
GFR, Estimated: >60 mL/min (ref >60)
GFR, Estimated: >60 mL/min (ref >60)
Glucose, Bld: 111 mg/dL — ABNORMAL HIGH (ref 70–99)
Glucose, Bld: 113 mg/dL — ABNORMAL HIGH (ref 70–99)
Potassium: 4.2 mmol/L (ref 3.5–5.1)
Potassium: 4.3 mmol/L (ref 3.5–5.1)
Sodium: 136 mmol/L (ref 135–145)
Sodium: 135 mmol/L (ref 135–145)
Total Bilirubin: 0.7 mg/dL (ref 0.3–1.2)
Total Bilirubin: 0.6 mg/dL (ref 0.3–1.2)
Total Protein: 6.3 g/dL — ABNORMAL LOW (ref 6.5–8.1)
Total Protein: 6.1 g/dL — ABNORMAL LOW (ref 6.5–8.1)

## 2021-08-25 LAB — URINALYSIS, ROUTINE W REFLEX MICROSCOPIC
Bacteria, UA: NONE SEEN
Bilirubin Urine: NEGATIVE
Glucose, UA: NEGATIVE mg/dL
Hgb urine dipstick: NEGATIVE
Ketones, ur: 5 mg/dL — AB
Leukocytes,Ua: NEGATIVE
Nitrite: NEGATIVE
Protein, ur: 30 mg/dL — AB
Specific Gravity, Urine: 1.041 — ABNORMAL HIGH (ref 1.005–1.030)
pH: 5 (ref 5.0–8.0)

## 2021-08-25 LAB — CBC WITH DIFFERENTIAL/PLATELET
Abs Immature Granulocytes: 0.05 10*3/uL (ref 0.00–0.07)
Abs Immature Granulocytes: 0.05 10*3/uL (ref 0.00–0.07)
Basophils Absolute: 0.1 10*3/uL (ref 0.0–0.1)
Basophils Absolute: 0 10*3/uL (ref 0.0–0.1)
Basophils Relative: 1 %
Basophils Relative: 0 %
Eosinophils Absolute: 0.2 10*3/uL (ref 0.0–0.5)
Eosinophils Absolute: 0 10*3/uL (ref 0.0–0.5)
Eosinophils Relative: 2 %
Eosinophils Relative: 0 %
HCT: 31.5 % — ABNORMAL LOW (ref 39.0–52.0)
HCT: 32.6 % — ABNORMAL LOW (ref 39.0–52.0)
Hemoglobin: 10.4 g/dL — ABNORMAL LOW (ref 13.0–17.0)
Hemoglobin: 10.5 g/dL — ABNORMAL LOW (ref 13.0–17.0)
Immature Granulocytes: 0 %
Immature Granulocytes: 1 %
Lymphocytes Relative: 13 %
Lymphocytes Relative: 1 %
Lymphs Abs: 1.6 10*3/uL (ref 0.7–4.0)
Lymphs Abs: 0.1 10*3/uL — ABNORMAL LOW (ref 0.7–4.0)
MCH: 30.7 pg (ref 26.0–34.0)
MCH: 30.4 pg (ref 26.0–34.0)
MCHC: 33 g/dL (ref 30.0–36.0)
MCHC: 32.2 g/dL (ref 30.0–36.0)
MCV: 92.9 fL (ref 80.0–100.0)
MCV: 94.5 fL (ref 80.0–100.0)
Monocytes Absolute: 1.8 10*3/uL — ABNORMAL HIGH (ref 0.1–1.0)
Monocytes Absolute: 0.5 10*3/uL (ref 0.1–1.0)
Monocytes Relative: 15 %
Monocytes Relative: 4 %
Neutro Abs: 8.4 10*3/uL — ABNORMAL HIGH (ref 1.7–7.7)
Neutro Abs: 9.9 10*3/uL — ABNORMAL HIGH (ref 1.7–7.7)
Neutrophils Relative %: 69 %
Neutrophils Relative %: 94 %
Platelets: 358 10*3/uL (ref 150–400)
Platelets: 407 10*3/uL — ABNORMAL HIGH (ref 150–400)
RBC: 3.39 MIL/uL — ABNORMAL LOW (ref 4.22–5.81)
RBC: 3.45 MIL/uL — ABNORMAL LOW (ref 4.22–5.81)
RDW: 14 % (ref 11.5–15.5)
RDW: 13.9 % (ref 11.5–15.5)
WBC: 12.2 10*3/uL — ABNORMAL HIGH (ref 4.0–10.5)
WBC: 10.5 10*3/uL (ref 4.0–10.5)
nRBC: 0 % (ref 0.0–0.2)
nRBC: 0 % (ref 0.0–0.2)

## 2021-08-25 LAB — LACTIC ACID, PLASMA
Lactic Acid, Venous: 2.2 mmol/L (ref 0.5–1.9)
Lactic Acid, Venous: 1.7 mmol/L (ref 0.5–1.9)

## 2021-08-25 LAB — TROPONIN I (HIGH SENSITIVITY)
Troponin I (High Sensitivity): 62 ng/L — ABNORMAL HIGH (ref <18)
Troponin I (High Sensitivity): 114 ng/L (ref <18)

## 2021-08-25 LAB — PROTIME-INR
INR: 1.1 (ref 0.8–1.2)
Prothrombin Time: 14.4 seconds (ref 11.4–15.2)

## 2021-08-25 MED ORDER — SODIUM CHLORIDE 0.9 % IV SOLN
2.0000 g | Freq: Once | INTRAVENOUS | Status: AC
  Administered 2021-08-25: 2 g via INTRAVENOUS
  Filled 2021-08-25: qty 2

## 2021-08-25 MED ORDER — HEPARIN (PORCINE) 25000 UT/250ML-% IV SOLN
1350.0000 [IU]/h | INTRAVENOUS | Status: DC
  Administered 2021-08-25: 1350 [IU]/h via INTRAVENOUS
  Filled 2021-08-25: qty 250

## 2021-08-25 MED ORDER — SODIUM CHLORIDE 0.9% FLUSH
10.0000 mL | Freq: Once | INTRAVENOUS | Status: AC
  Administered 2021-08-25: 10 mL

## 2021-08-25 MED ORDER — ATORVASTATIN CALCIUM 40 MG PO TABS
80.0000 mg | ORAL_TABLET | Freq: Every day | ORAL | Status: DC
  Administered 2021-08-26 – 2021-09-02 (×8): 80 mg via ORAL
  Filled 2021-08-25 (×8): qty 2

## 2021-08-25 MED ORDER — SODIUM CHLORIDE 0.9 % IV SOLN
2000.0000 mg/m2 | INTRAVENOUS | Status: DC
  Administered 2021-08-25: 4250 mg via INTRAVENOUS
  Filled 2021-08-25: qty 85

## 2021-08-25 MED ORDER — MORPHINE SULFATE ER 15 MG PO TBCR
15.0000 mg | EXTENDED_RELEASE_TABLET | Freq: Two times a day (BID) | ORAL | Status: DC
  Administered 2021-08-26 – 2021-09-03 (×16): 15 mg via ORAL
  Filled 2021-08-25 (×16): qty 1

## 2021-08-25 MED ORDER — FENTANYL CITRATE PF 50 MCG/ML IJ SOSY
50.0000 ug | PREFILLED_SYRINGE | Freq: Once | INTRAMUSCULAR | Status: DC
  Filled 2021-08-25: qty 1

## 2021-08-25 MED ORDER — DEXTROSE 5 % IV SOLN: Freq: Once | INTRAVENOUS | Status: AC

## 2021-08-25 MED ORDER — ONDANSETRON HCL 4 MG PO TABS: 8.0000 mg | ORAL_TABLET | Freq: Three times a day (TID) | ORAL | Status: DC | PRN

## 2021-08-25 MED ORDER — LIDOCAINE-PRILOCAINE 2.5-2.5 % EX CREA
1.0000 "application " | TOPICAL_CREAM | CUTANEOUS | Status: DC | PRN
  Filled 2021-08-25: qty 5

## 2021-08-25 MED ORDER — ALPRAZOLAM 0.5 MG PO TABS
0.5000 mg | ORAL_TABLET | Freq: Two times a day (BID) | ORAL | Status: DC | PRN
  Administered 2021-08-28 – 2021-09-02 (×6): 0.5 mg via ORAL
  Filled 2021-08-25 (×6): qty 1

## 2021-08-25 MED ORDER — SODIUM CHLORIDE 0.9 % IV BOLUS
1000.0000 mL | Freq: Once | INTRAVENOUS | Status: AC
  Administered 2021-08-25: 1000 mL via INTRAVENOUS

## 2021-08-25 MED ORDER — MORPHINE SULFATE ER 15 MG PO TBCR: 15.0000 mg | EXTENDED_RELEASE_TABLET | Freq: Two times a day (BID) | ORAL | 0 refills | Status: DC

## 2021-08-25 MED ORDER — METRONIDAZOLE 500 MG/100ML IV SOLN
500.0000 mg | Freq: Once | INTRAVENOUS | Status: AC
  Administered 2021-08-25: 500 mg via INTRAVENOUS
  Filled 2021-08-25: qty 100

## 2021-08-25 MED ORDER — MORPHINE SULFATE ER 15 MG PO TBCR
15.0000 mg | EXTENDED_RELEASE_TABLET | Freq: Once | ORAL | Status: AC
  Administered 2021-08-25: 15 mg via ORAL
  Filled 2021-08-25: qty 1

## 2021-08-25 MED ORDER — VANCOMYCIN HCL 1500 MG/300ML IV SOLN
1500.0000 mg | Freq: Once | INTRAVENOUS | Status: AC
  Administered 2021-08-25: 1500 mg via INTRAVENOUS
  Filled 2021-08-25: qty 300

## 2021-08-25 MED ORDER — IOHEXOL 350 MG/ML SOLN
80.0000 mL | Freq: Once | INTRAVENOUS | Status: AC | PRN
  Administered 2021-08-25: 80 mL via INTRAVENOUS

## 2021-08-25 MED ORDER — OXALIPLATIN CHEMO INJECTION 100 MG/20ML
85.0000 mg/m2 | Freq: Once | INTRAVENOUS | Status: AC
  Administered 2021-08-25: 180 mg via INTRAVENOUS
  Filled 2021-08-25: qty 36

## 2021-08-25 MED ORDER — LISINOPRIL 2.5 MG PO TABS
2.5000 mg | ORAL_TABLET | Freq: Every day | ORAL | Status: DC
  Filled 2021-08-25: qty 1

## 2021-08-25 MED ORDER — SODIUM CHLORIDE 0.9% FLUSH: 10.0000 mL | INTRAVENOUS | Status: DC | PRN

## 2021-08-25 MED ORDER — LEUCOVORIN CALCIUM INJECTION 350 MG
400.0000 mg/m2 | Freq: Once | INTRAVENOUS | Status: AC
  Administered 2021-08-25: 852 mg via INTRAVENOUS
  Filled 2021-08-25: qty 25

## 2021-08-25 MED ORDER — SODIUM CHLORIDE 0.9 % IV SOLN: INTRAVENOUS | Status: AC

## 2021-08-25 MED ORDER — VANCOMYCIN HCL IN DEXTROSE 1-5 GM/200ML-% IV SOLN: 1000.0000 mg | Freq: Once | INTRAVENOUS | Status: DC

## 2021-08-25 MED ORDER — HEPARIN BOLUS VIA INFUSION
2500.0000 [IU] | Freq: Once | INTRAVENOUS | Status: AC
  Administered 2021-08-25: 2500 [IU] via INTRAVENOUS
  Filled 2021-08-25: qty 2500

## 2021-08-25 MED ORDER — MIRTAZAPINE 15 MG PO TABS
7.5000 mg | ORAL_TABLET | Freq: Every day | ORAL | Status: DC
  Administered 2021-08-26 – 2021-09-02 (×9): 7.5 mg via ORAL
  Filled 2021-08-25 (×9): qty 1

## 2021-08-25 MED ORDER — OXYCODONE HCL 5 MG PO TABS
5.0000 mg | ORAL_TABLET | ORAL | Status: DC | PRN
Start: 2021-08-25 — End: 2021-08-28
  Administered 2021-08-26: 5 mg via ORAL
  Filled 2021-08-25: qty 1

## 2021-08-25 MED ORDER — MIRTAZAPINE 7.5 MG PO TABS: 7.5000 mg | ORAL_TABLET | Freq: Every day | ORAL | 1 refills | Status: DC

## 2021-08-25 MED ORDER — PALONOSETRON HCL INJECTION 0.25 MG/5ML
0.2500 mg | Freq: Once | INTRAVENOUS | Status: AC
  Administered 2021-08-25: 0.25 mg via INTRAVENOUS

## 2021-08-25 NOTE — ED Provider Notes (Incomplete)
[place] DEPT Provider Note   CSN: [id number] Arrival date & time: 08/25/21  1704     History {Add pertinent medical, surgical, social history, OB history to HPI:1} No chief complaint on file.   Diogenes Whirley Bortner is a 81 y.o. male.  HPI     Home Medications Prior to Admission medications   Medication Sig Start Date End Date Taking? Authorizing Provider  ALPRAZolam Duanne Moron) 0.5 MG tablet Take 1 tablet (0.5 mg total) by mouth 2 (two) times daily as needed for anxiety. 07/07/21   Biagio Borg, MD  aspirin EC 81 MG tablet Take 81 mg by mouth daily.    [provider]  atorvastatin (LIPITOR) 80 MG tablet Take 1 tablet (80 mg total) by mouth daily at 6 PM. 01/27/21   Hilty, Nadean Corwin, MD  capecitabine (XELODA) 500 MG tablet Take 3 tablets every 12 hours, for 14 days then off for 7 days. Take after meal. 08/21/21   Truitt Merle, MD  Cholecalciferol (VITAMIN D3 PO) Take 1 tablet by mouth daily.    [provider]  HYDROcodone-acetaminophen (NORCO) 5-325 MG tablet Take 1 tablet by mouth every 4 (four) hours as needed for moderate pain. 08/05/21   Truitt Merle, MD  lidocaine-prilocaine (EMLA) cream Apply 1 application topically as needed. Apply 0.33gm to PortaCath site 30 to 60 mins prior to PortaCath access. 07/27/21   Truitt Merle, MD  lisinopril (ZESTRIL) 2.5 MG tablet Take 1 tablet (2.5 mg total) by mouth daily. 12/01/20   Biagio Borg, MD  mirtazapine (REMERON) 7.5 MG tablet Take 1 tablet (7.5 mg total) by mouth at bedtime. 08/25/21   Alla Feeling, NP  morphine (MS CONTIN) 15 MG 12 hr tablet Take 1 tablet (15 mg total) by mouth every 12 (twelve) hours. 08/25/21   Alla Feeling, NP  nitroGLYCERIN (NITROSTAT) 0.4 MG SL tablet Place 1 tablet (0.4 mg total) under the tongue every 5 (five) minutes as needed for chest pain. Max 3 doses. 03/13/18   Hilty, Nadean Corwin, MD  Omega-3 Fatty Acids (FISH OIL) 500 MG CAPS Take 500 mg by mouth daily.    [provider]  ondansetron (ZOFRAN) 8 MG tablet Take 1 tablet (8 mg total) by mouth every 8 (eight) hours as needed for nausea or vomiting. 07/27/21   Truitt Merle, MD  oxyCODONE (OXY IR/ROXICODONE) 5 MG immediate release tablet Take 1 tablet (5 mg total) by mouth every 4 (four) hours as needed for severe pain. 08/12/21   Truitt Merle, MD  prochlorperazine (COMPAZINE) 10 MG tablet Take 1 tablet (10 mg total) by mouth every 6 (six) hours as needed for nausea or vomiting. 07/27/21   Truitt Merle, MD  traMADol (ULTRAM) 50 MG tablet Take 1 tablet (50 mg total) by mouth every 6 (six) hours as needed. 07/21/21   Lincoln Brigham, PA-C      Allergies    Ofloxacin    Review of Systems   Review of Systems  Physical Exam Updated Vital Signs BP 109/67    Pulse 96    Temp 98.1 F (36.7 C)    Resp (!) 25    SpO2 90%  Physical Exam  ED Results / Procedures / Treatments   Labs (all labs ordered are listed, but only abnormal results are displayed) Labs Reviewed - No data to display  EKG None  Radiology CT CHEST ABDOMEN PELVIS W CONTRAST  Addendum Date: 08/25/2021   ADDENDUM REPORT: 08/25/2021 11:30 ADDENDUM: Upper  normal 10 mm short axis left thoracic inlet node on 04/02. 11 mm short axis left supraclavicular node on image 3/2 is upper normal to mildly enlarged. Electronically Signed   By: Misty Stanley M.D.   On: 08/25/2021 11:30   Result Date: 08/25/2021 CLINICAL DATA:  Gastric cancer.  Staging. EXAM: CT CHEST, ABDOMEN, AND PELVIS WITH CONTRAST TECHNIQUE: Multidetector CT imaging of the chest, abdomen and pelvis was performed following the standard protocol during bolus administration of intravenous contrast. RADIATION DOSE REDUCTION: This exam was performed according to the departmental dose-optimization program which includes automated exposure control, adjustment of the mA and/or kV according to patient size and/or use of iterative reconstruction technique. CONTRAST:  1109mL OMNIPAQUE IOHEXOL 300 MG/ML   SOLN COMPARISON:  07/01/2021. FINDINGS: CT CHEST FINDINGS Cardiovascular: The heart size is normal. No substantial pericardial effusion. Coronary artery calcification is evident. Mild atherosclerotic calcification is noted in the wall of the thoracic aorta. Ascending thoracic aorta measures 4.5 cm diameter. Right Port-A-Cath tip is positioned in the distal SVC. Mediastinum/Nodes: 16 mm short axis precarinal node has increased from 6 mm (remeasured) previously. 10 mm short axis right hilar lymph node is mildly progressive in the interval. The esophagus has normal imaging features. There is no axillary lymphadenopathy. Lungs/Pleura: Biapical pleuroparenchymal scarring noted. Numerous bilateral pulmonary nodules identified, similar to prior. Index right lower pulmonary nodule measures 6 mm on image 72/5. Index nodule posterior left lower lobe is 6 mm on 83/5. Index right lower lobe nodule measuring 9 mm identified on image 90/5. Index nodule left lower lobe on 01/12/5 measures 5 mm. No focal consolidation.  Trace left pleural effusion evident. Musculoskeletal: No worrisome lytic or sclerotic osseous abnormality. CT ABDOMEN PELVIS FINDINGS Hepatobiliary: Innumerable hepatic metastases are again noted. One of the more dominant lesions is identified in the lateral segment left liver on image 67/2 measuring 4.8 x 2.7 cm today compared to 4.3 x 2.5 cm when remeasured in a similar fashion on the prior study. Another dominant lesion identified in segment IV measures 5.0 x 4.1 cm on 59/2, clearly increased from 2.7 x 1.8 cm when remeasured on the prior study. 3.0 cm posterior right hepatic lobe lesion on 57/2 was 2.2 cm previously (remeasured). 2.5 cm lesion in the medial right liver adjacent to the IVC has increased from 1.1 cm previously. 6.5 x 4.0 cm lesion inferior tip right liver (82/2 was measured on the previous study at 4.8 x 3.1 cm. There is no evidence for gallstones, gallbladder wall thickening, or pericholecystic  fluid. No intrahepatic or extrahepatic biliary dilation. Pancreas: No focal mass lesion. No dilatation of the main duct. No intraparenchymal cyst. No peripancreatic edema. Spleen: Small infarct noted inferior spleen. (Axial 62/2 and coronal 100/6). Adrenals/Urinary Tract: No adrenal nodule or mass. Kidneys unremarkable. No evidence for hydroureter. The urinary bladder appears normal for the degree of distention. Stomach/Bowel: Stomach is decompressed. Duodenum is normally positioned as is the ligament of Treitz. No small bowel wall thickening. No small bowel dilatation. The terminal ileum is normal. The appendix is not well visualized, but there is no edema or inflammation in the region of the cecum. No gross colonic mass. No colonic wall thickening. Vascular/Lymphatic: There is mild atherosclerotic calcification of the abdominal aorta without aneurysm. Necrotic lymphadenopathy again identified in the hepatoduodenal ligament. 3.2 x 2.1 cm node visible on 63/2 was measured previously at 2.8 x 2.3 cm. 2.0 cm short axis aortocaval node seen on 72/2. 13 mm short axis right retrocrural node evident. No pelvic  sidewall lymphadenopathy. Reproductive: The prostate gland and seminal vesicles are unremarkable. Other: No intraperitoneal free fluid. Musculoskeletal: Small right groin hernia contains small bowel without complicating features. There is a left groin hernia containing a short segment of the sigmoid colon without complicating features. No worrisome lytic or sclerotic osseous abnormality. IMPRESSION: 1. Mild interval progression of mediastinal and right hilar lymphadenopathy. 2. Similar appearance of numerous bilateral pulmonary nodules compatible with metastatic disease. 3. Interval progression of innumerable hepatic metastases. 4. Similar to mildly progressive necrotic lymphadenopathy in the hepatoduodenal ligament and aortocaval space. 5. Small inferior splenic infarct, new since prior. 6. Bilateral groin hernias  containing small bowel and colon, respectively, without complicating features. 7. 4.5 cm ascending thoracic aortic aneurysm. Recommend semi-annual imaging followup by CTA or MRA and referral to cardiothoracic surgery if not already obtained. 2010; 121: [id number]. 8. Aortic Atherosclerosis (ICD10-I70.0). Electronically Signed: By: Misty Stanley M.D. On: 08/25/2021 10:01    Procedures Procedures  {Document cardiac monitor, telemetry assessment procedure when appropriate:1}  Medications Ordered in ED Medications - No data to display  ED Course/ Medical Decision Making/ A&P                           Medical Decision Making  ***  {Document critical care time when appropriate:1} {Document review of labs and clinical decision tools ie heart score, Chads2Vasc2 etc:1}  {Document your independent review of radiology images, and any outside records:1} {Document your discussion with family members, caretakers, and with consultants:1} {Document social determinants of health affecting pt's care:1} {Document your decision making why or why not admission, treatments were needed:1} Final Clinical Impression(s) / ED Diagnoses Final diagnoses:  None    Rx / DC Orders ED Discharge Orders     None

## 2021-08-25 NOTE — Patient Instructions (Addendum)
[facility]  Discharge Instructions: Thank you for choosing [facility] to provide your oncology and hematology care.   If you have a lab appointment with the [facility], please go directly to the [facility] and check in at the registration area.   Wear comfortable clothing and clothing appropriate for easy access to any Portacath or PICC line.   We strive to give you quality time with your provider. You may need to reschedule your appointment if you arrive late (15 or more minutes).  Arriving late affects you and other patients whose appointments are after yours.  Also, if you miss three or more appointments without notifying the office, you may be dismissed from the clinic at the providers discretion.      For prescription refill requests, have your pharmacy contact our office and allow 72 hours for refills to be completed.    Today you received the following chemotherapy and/or immunotherapy agents    Oxaliplatin, Leucovorin, Fluorouracil     To help prevent nausea and vomiting after your treatment, we encourage you to take your nausea medication as directed.  BELOW ARE SYMPTOMS THAT SHOULD BE REPORTED IMMEDIATELY: *FEVER GREATER THAN 100.4 F (38 C) OR HIGHER *CHILLS OR SWEATING *NAUSEA AND VOMITING THAT IS NOT CONTROLLED WITH YOUR NAUSEA MEDICATION *UNUSUAL SHORTNESS OF BREATH *UNUSUAL BRUISING OR BLEEDING *URINARY PROBLEMS (pain or burning when urinating, or frequent urination) *BOWEL PROBLEMS (unusual diarrhea, constipation, pain near the anus) TENDERNESS IN MOUTH AND THROAT WITH OR WITHOUT PRESENCE OF ULCERS (sore throat, sores in mouth, or a toothache) UNUSUAL RASH, SWELLING OR PAIN  UNUSUAL VAGINAL DISCHARGE OR ITCHING   Items with * indicate a potential emergency and should be followed up as soon as possible or go to the Emergency Department if any problems should occur.  Please show the CHEMOTHERAPY ALERT CARD or  IMMUNOTHERAPY ALERT CARD at check-in to the Emergency Department and triage nurse.  Should you have questions after your visit or need to cancel or reschedule your appointment, please contact [facility]  Dept: [phone]  and follow the prompts.  Office hours are 8:00 a.m. to 4:30 p.m. Monday - Friday. Please note that voicemails left after 4:00 p.m. may not be returned until the following business day.  We are closed weekends and major holidays. You have access to a nurse at all times for urgent questions. Please call the main number to the clinic Dept: [phone] and follow the prompts.   For any non-urgent questions, you may also contact your provider using MyChart. We now offer e-Visits for anyone 61 and older to request care online for non-urgent symptoms. For details visit [email or web address].   Also download the MyChart app! Go to the app store, search "MyChart", open the app, select [facility], and log in with your MyChart username and password.  Due to Covid, a mask is required upon entering the hospital/clinic. If you do not have a mask, one will be given to you upon arrival. For doctor visits, patients may have 1 support person aged 68 or older with them. For treatment visits, patients cannot have anyone with them due to current Covid guidelines and our immunocompromised population.   Oxaliplatin Injection What is this medication? OXALIPLATIN (ox AL i PLA tin) is a chemotherapy drug. It targets fast dividing cells, like cancer cells, and causes these cells to die. This medicine is used to treat cancers of the colon and rectum, and many other  cancers. This medicine may be used for other purposes; ask your health care provider or pharmacist if you have questions. COMMON BRAND NAME(S): Eloxatin What should I tell my care team before I take this medication? They need to know if you have any of these conditions: heart disease history of irregular  heartbeat liver disease low blood counts, like white cells, platelets, or red blood cells lung or breathing disease, like asthma take medicines that treat or prevent blood clots tingling of the fingers or toes, or other nerve disorder an unusual or allergic reaction to oxaliplatin, other chemotherapy, other medicines, foods, dyes, or preservatives pregnant or trying to get pregnant breast-feeding How should I use this medication? This drug is given as an infusion into a vein. It is administered in a hospital or clinic by a specially trained health care professional. Talk to your pediatrician regarding the use of this medicine in children. Special care may be needed. Overdosage: If you think you have taken too much of this medicine contact a poison control center or emergency room at once. NOTE: This medicine is only for you. Do not share this medicine with others. What if I miss a dose? It is important not to miss a dose. Call your doctor or health care professional if you are unable to keep an appointment. What may interact with this medication? Do not take this medicine with any of the following medications: cisapride dronedarone pimozide thioridazine This medicine may also interact with the following medications: aspirin and aspirin-like medicines certain medicines that treat or prevent blood clots like warfarin, apixaban, dabigatran, and rivaroxaban cisplatin cyclosporine diuretics medicines for infection like acyclovir, adefovir, amphotericin B, bacitracin, cidofovir, foscarnet, ganciclovir, gentamicin, pentamidine, vancomycin NSAIDs, medicines for pain and inflammation, like ibuprofen or naproxen other medicines that prolong the QT interval (an abnormal heart rhythm) pamidronate zoledronic acid This list may not describe all possible interactions. Give your health care provider a list of all the medicines, herbs, non-prescription drugs, or dietary supplements you use. Also tell  them if you smoke, drink alcohol, or use illegal drugs. Some items may interact with your medicine. What should I watch for while using this medication? Your condition will be monitored carefully while you are receiving this medicine. You may need blood work done while you are taking this medicine. This medicine may make you feel generally unwell. This is not uncommon as chemotherapy can affect healthy cells as well as cancer cells. Report any side effects. Continue your course of treatment even though you feel ill unless your healthcare professional tells you to stop. This medicine can make you more sensitive to cold. Do not drink cold drinks or use ice. Cover exposed skin before coming in contact with cold temperatures or cold objects. When out in cold weather wear warm clothing and cover your mouth and nose to warm the air that goes into your lungs. Tell your doctor if you get sensitive to the cold. Do not become pregnant while taking this medicine or for 9 months after stopping it. Women should inform their health care professional if they wish to become pregnant or think they might be pregnant. Men should not father a child while taking this medicine and for 6 months after stopping it. There is potential for serious side effects to an unborn child. Talk to your health care professional for more information. Do not breast-feed a child while taking this medicine or for 3 months after stopping it. This medicine has caused ovarian failure in some women.  This medicine may make it more difficult to get pregnant. Talk to your health care professional if you are concerned about your fertility. This medicine has caused decreased sperm counts in some men. This may make it more difficult to father a child. Talk to your health care professional if you are concerned about your fertility. This medicine may increase your risk of getting an infection. Call your health care professional for advice if you get a fever,  chills, or sore throat, or other symptoms of a cold or flu. Do not treat yourself. Try to avoid being around people who are sick. Avoid taking medicines that contain aspirin, acetaminophen, ibuprofen, naproxen, or ketoprofen unless instructed by your health care professional. These medicines may hide a fever. Be careful brushing or flossing your teeth or using a toothpick because you may get an infection or bleed more easily. If you have any dental work done, tell your dentist you are receiving this medicine. What side effects may I notice from receiving this medication? Side effects that you should report to your doctor or health care professional as soon as possible: allergic reactions like skin rash, itching or hives, swelling of the face, lips, or tongue breathing problems cough low blood counts - this medicine may decrease the number of white blood cells, red blood cells, and platelets. You may be at increased risk for infections and bleeding nausea, vomiting pain, redness, or irritation at site where injected pain, tingling, numbness in the hands or feet signs and symptoms of bleeding such as bloody or black, tarry stools; red or dark brown urine; spitting up blood or brown material that looks like coffee grounds; red spots on the skin; unusual bruising or bleeding from the eyes, gums, or nose signs and symptoms of a dangerous change in heartbeat or heart rhythm like chest pain; dizziness; fast, irregular heartbeat; palpitations; feeling faint or lightheaded; falls signs and symptoms of infection like fever; chills; cough; sore throat; pain or trouble passing urine signs and symptoms of liver injury like dark yellow or brown urine; general ill feeling or flu-like symptoms; light-colored stools; loss of appetite; nausea; right upper belly pain; unusually weak or tired; yellowing of the eyes or skin signs and symptoms of low red blood cells or anemia such as unusually weak or tired; feeling faint  or lightheaded; falls signs and symptoms of muscle injury like dark urine; trouble passing urine or change in the amount of urine; unusually weak or tired; muscle pain; back pain Side effects that usually do not require medical attention (report to your doctor or health care professional if they continue or are bothersome): changes in taste diarrhea gas hair loss loss of appetite mouth sores This list may not describe all possible side effects. Call your doctor for medical advice about side effects. You may report side effects to FDA at 1-800-FDA-1088. Where should I keep my medication? This drug is given in a hospital or clinic and will not be stored at home. NOTE: This sheet is a summary. It may not cover all possible information. If you have questions about this medicine, talk to your doctor, pharmacist, or health care provider.  2022 Elsevier/Gold Standard (2021-04-12 00:00:00)  Leucovorin injection What is this medication? LEUCOVORIN (loo koe VOR in) is used to prevent or treat the harmful effects of some medicines. This medicine is used to treat anemia caused by a low amount of folic acid in the body. It is also used with 5-fluorouracil (5-FU) to treat colon cancer. This  medicine may be used for other purposes; ask your health care provider or pharmacist if you have questions. What should I tell my care team before I take this medication? They need to know if you have any of these conditions: anemia from low levels of vitamin B-12 in the blood an unusual or allergic reaction to leucovorin, folic acid, other medicines, foods, dyes, or preservatives pregnant or trying to get pregnant breast-feeding How should I use this medication? This medicine is for injection into a muscle or into a vein. It is given by a health care professional in a hospital or clinic setting. Talk to your pediatrician regarding the use of this medicine in children. Special care may be needed. Overdosage: If you  think you have taken too much of this medicine contact a poison control center or emergency room at once. NOTE: This medicine is only for you. Do not share this medicine with others. What if I miss a dose? This does not apply. What may interact with this medication? capecitabine fluorouracil phenobarbital phenytoin primidone trimethoprim-sulfamethoxazole This list may not describe all possible interactions. Give your health care provider a list of all the medicines, herbs, non-prescription drugs, or dietary supplements you use. Also tell them if you smoke, drink alcohol, or use illegal drugs. Some items may interact with your medicine. What should I watch for while using this medication? Your condition will be monitored carefully while you are receiving this medicine. This medicine may increase the side effects of 5-fluorouracil, 5-FU. Tell your doctor or health care professional if you have diarrhea or mouth sores that do not get better or that get worse. What side effects may I notice from receiving this medication? Side effects that you should report to your doctor or health care professional as soon as possible: allergic reactions like skin rash, itching or hives, swelling of the face, lips, or tongue breathing problems fever, infection mouth sores unusual bleeding or bruising unusually weak or tired Side effects that usually do not require medical attention (report to your doctor or health care professional if they continue or are bothersome): constipation or diarrhea loss of appetite nausea, vomiting This list may not describe all possible side effects. Call your doctor for medical advice about side effects. You may report side effects to FDA at 1-800-FDA-1088. Where should I keep my medication? This drug is given in a hospital or clinic and will not be stored at home. NOTE: This sheet is a summary. It may not cover all possible information. If you have questions about this  medicine, talk to your doctor, pharmacist, or health care provider.  2022 Elsevier/Gold Standard (2008-01-30 00:00:00)  Fluorouracil, 5-FU injection What is this medication? FLUOROURACIL, 5-FU (flure oh YOOR a sil) is a chemotherapy drug. It slows the growth of cancer cells. This medicine is used to treat many types of cancer like breast cancer, colon or rectal cancer, pancreatic cancer, and stomach cancer. This medicine may be used for other purposes; ask your health care provider or pharmacist if you have questions. COMMON BRAND NAME(S): Adrucil What should I tell my care team before I take this medication? They need to know if you have any of these conditions: blood disorders dihydropyrimidine dehydrogenase (DPD) deficiency infection (especially a virus infection such as chickenpox, cold sores, or herpes) kidney disease liver disease malnourished, poor nutrition recent or ongoing radiation therapy an unusual or allergic reaction to fluorouracil, other chemotherapy, other medicines, foods, dyes, or preservatives pregnant or trying to get pregnant breast-feeding How  should I use this medication? This drug is given as an infusion or injection into a vein. It is administered in a hospital or clinic by a specially trained health care professional. Talk to your pediatrician regarding the use of this medicine in children. Special care may be needed. Overdosage: If you think you have taken too much of this medicine contact a poison control center or emergency room at once. NOTE: This medicine is only for you. Do not share this medicine with others. What if I miss a dose? It is important not to miss your dose. Call your doctor or health care professional if you are unable to keep an appointment. What may interact with this medication? Do not take this medicine with any of the following medications: live virus vaccines This medicine may also interact with the following medications: medicines  that treat or prevent blood clots like warfarin, enoxaparin, and dalteparin This list may not describe all possible interactions. Give your health care provider a list of all the medicines, herbs, non-prescription drugs, or dietary supplements you use. Also tell them if you smoke, drink alcohol, or use illegal drugs. Some items may interact with your medicine. What should I watch for while using this medication? Visit your doctor for checks on your progress. This drug may make you feel generally unwell. This is not uncommon, as chemotherapy can affect healthy cells as well as cancer cells. Report any side effects. Continue your course of treatment even though you feel ill unless your doctor tells you to stop. In some cases, you may be given additional medicines to help with side effects. Follow all directions for their use. Call your doctor or health care professional for advice if you get a fever, chills or sore throat, or other symptoms of a cold or flu. Do not treat yourself. This drug decreases your body's ability to fight infections. Try to avoid being around people who are sick. This medicine may increase your risk to bruise or bleed. Call your doctor or health care professional if you notice any unusual bleeding. Be careful brushing and flossing your teeth or using a toothpick because you may get an infection or bleed more easily. If you have any dental work done, tell your dentist you are receiving this medicine. Avoid taking products that contain aspirin, acetaminophen, ibuprofen, naproxen, or ketoprofen unless instructed by your doctor. These medicines may hide a fever. Do not become pregnant while taking this medicine. Women should inform their doctor if they wish to become pregnant or think they might be pregnant. There is a potential for serious side effects to an unborn child. Talk to your health care professional or pharmacist for more information. Do not breast-feed an infant while taking  this medicine. Men should inform their doctor if they wish to father a child. This medicine may lower sperm counts. Do not treat diarrhea with over the counter products. Contact your doctor if you have diarrhea that lasts more than 2 days or if it is severe and watery. This medicine can make you more sensitive to the sun. Keep out of the sun. If you cannot avoid being in the sun, wear protective clothing and use sunscreen. Do not use sun lamps or tanning beds/booths. What side effects may I notice from receiving this medication? Side effects that you should report to your doctor or health care professional as soon as possible: allergic reactions like skin rash, itching or hives, swelling of the face, lips, or tongue low blood counts -  this medicine may decrease the number of white blood cells, red blood cells and platelets. You may be at increased risk for infections and bleeding. signs of infection - fever or chills, cough, sore throat, pain or difficulty passing urine signs of decreased platelets or bleeding - bruising, pinpoint red spots on the skin, black, tarry stools, blood in the urine signs of decreased red blood cells - unusually weak or tired, fainting spells, lightheadedness breathing problems changes in vision chest pain mouth sores nausea and vomiting pain, swelling, redness at site where injected pain, tingling, numbness in the hands or feet redness, swelling, or sores on hands or feet stomach pain unusual bleeding Side effects that usually do not require medical attention (report to your doctor or health care professional if they continue or are bothersome): changes in finger or toe nails diarrhea dry or itchy skin hair loss headache loss of appetite sensitivity of eyes to the light stomach upset unusually teary eyes This list may not describe all possible side effects. Call your doctor for medical advice about side effects. You may report side effects to FDA at  1-800-FDA-1088. Where should I keep my medication? This drug is given in a hospital or clinic and will not be stored at home. NOTE: This sheet is a summary. It may not cover all possible information. If you have questions about this medicine, talk to your doctor, pharmacist, or health care provider.  2022 Elsevier/Gold Standard (2021-04-12 00:00:00)  The chemotherapy medication bag should finish at 46 hours, 96 hours, or 7 days. For example, if your pump is scheduled for 46 hours and it was put on at 4:00 p.m., it should finish at 2:00 p.m. the day it is scheduled to come off regardless of your appointment time.     Estimated time to finish at 0930.   If the display on your pump reads "Low Volume" and it is beeping, take the batteries out of the pump and come to the cancer center for it to be taken off.   If the pump alarms go off prior to the pump reading "Low Volume" then call [phone] and someone can assist you.  If the plunger comes out and the chemotherapy medication is leaking out, please use your home chemo spill kit to clean up the spill. Do NOT use paper towels or other household products.  If you have problems or questions regarding your pump, please call either [phone] (24 hours a day) or the cancer center Monday-Friday 8:00 a.m.- 4:30 p.m. at the clinic number and we will assist you. If you are unable to get assistance, then go to the nearest Emergency Department and ask the staff to contact the IV team for assistance.

## 2021-08-25 NOTE — Progress Notes (Signed)
Ok to treat with elevated AST today per Provider

## 2021-08-25 NOTE — ED Notes (Addendum)
Pt side-lying and BP 69/45 despite 2L normal saline. Pt receiving pressure bagged fluid bolus x2 at this time with improvement in BP. Pt Trendelenburg'd at this time and lying supine.

## 2021-08-25 NOTE — Progress Notes (Signed)
A consult was received from an ED physician for vancomycin and cefepime per pharmacy dosing.  The patient's profile has been reviewed for ht/wt/allergies/indication/available labs.   A one time order has been placed for vancomycin 1500mg  IV x1 and cefepime 2g IV x1.  Further antibiotics/pharmacy consults should be ordered by admitting physician if indicated.                       Thank you,  Dimple Nanas, PharmD 08/25/2021 6:19 PM

## 2021-08-25 NOTE — Progress Notes (Signed)
Ok to proceed with full dose oxaliplatin with cycle 2.  Raul Del [place], [place], [place], BCPS, BCOP 08/25/2021 10:22 AM

## 2021-08-25 NOTE — Progress Notes (Signed)
ANTICOAGULATION CONSULT NOTE - Initial Consult  Pharmacy Consult for Heparin Indication: pulmonary embolus  Allergies  Allergen Reactions   Ofloxacin     REACTION: rash    Patient Measurements: Height: 6\' 1"  (185.4 cm) Weight: 83.6 kg (184 lb 4.8 oz) IBW/kg (Calculated) : 79.9 Heparin Dosing Weight: TBW  Vital Signs: Temp: 98.1 F (36.7 C) (01/19 1724) BP: 126/74 (01/19 2115) Pulse Rate: 108 (01/19 2115)  Labs: Recent Labs    08/25/21 0844 08/25/21 1910 08/25/21 2130  HGB 10.4* 10.5*  --   HCT 31.5* 32.6*  --   PLT 358 407*  --   LABPROT  --  14.4  --   INR  --  1.1  --   CREATININE 0.98 1.06  --   TROPONINIHS  --  62* 114*    Estimated Creatinine Clearance: 62.8 mL/min (by C-G formula based on SCr of 1.06 mg/dL).   Medical History: Past Medical History:  Diagnosis Date   Basal cell carcinoma 05/29/2012   left thigh-(EXC)   Basal cell carcinoma 10/23/2012   right side of nose (MOHS)   Basal cell carcinoma 12/12/2012   nod-irght cheek (MOHS)   Basal cell carcinoma 05/26/2015   nod-left calf (CX35FU)   Basal cell carcinoma 05/26/2015   nod-left nose (txpbx)   Basal cell carcinoma 06/08/2016   nod-above right brow (CX35FU)   Basal cell carcinoma 09/12/2016   left nose (MOHS)   Basal cell carcinoma 01/29/2019   nod-right tip of nose (MOHS)   Benign neoplasm of colon    Finger fracture    Hx of colonoscopy    Hyperlipidemia    Hypertension    Internal hemorrhoids without mention of complication    Myocardial infarction ([place])    09/01/2015   Plantar fascial fibromatosis    Prostatitis    Rib fracture    Squamous cell carcinoma of skin 07/13/2009   RIght lower outer leg   Squamous cell carcinoma of skin 02/17/2014   in situ-right temple (txpbx)   Squamous cell carcinoma of skin 01/24/2018   in situ-right chest (txpbx)   Squamous cell carcinoma of skin 01/24/2018   in situ0 right forehead (txpbx)   Squamous cell carcinoma of skin      Medications:  Infusions:   sodium chloride 100 mL/hr at 08/25/21 1835   vancomycin 1,500 mg (08/25/21 2122)    Assessment: 81 yo M with new RML PE with right heart strain on CT. PMH significant for metastatic gastric adenocarcinoma on chemotherapy.   Not currently on anticoagulation PTA.  Baseline labs: CBC- Hg slightly low but stable ~10.5, pltc 407, Scr 1.06, INR at baseline 1.1.  Goal of Therapy:  Heparin level 0.3-0.7 units/ml Monitor platelets by anticoagulation protocol: Yes   Plan:  Heparin 2500 units IV x1 followed by continuous infusion at 1350 units/hr Check heparin level 8hrs after infusion starts Daily heparin level & CBC while on heparin F/U plans for long-term anticoagulation  [name] PharmD 08/25/2021,10:27 PM

## 2021-08-25 NOTE — ED Provider Notes (Addendum)
[facility] DEPT Provider Note   CSN: [id number] Arrival date & time: 08/25/21  1704     History  Chief Complaint  Patient presents with   Weakness    [place] is a 81 y.o. male.  Presented to the emergency room with concern for weakness, fever, shortness of breath.  Patient reports that he was completed chemo treatment this morning, no known complications immediately during the infusion.  However when he came home he felt flushed, feverish and short of breath.  States that the breathing has since resolved and he does not currently feel short of breath.  No chest pain or back pain right now.  Fever was up to 101,   Additional history obtained from chart review, discussion with wife at bedside. HPI     Home Medications Prior to Admission medications   Medication Sig Start Date End Date Taking? Authorizing Provider  ALPRAZolam Duanne Moron) 0.5 MG tablet Take 1 tablet (0.5 mg total) by mouth 2 (two) times daily as needed for anxiety. 07/07/21  Yes Biagio Borg, MD  aspirin EC 81 MG tablet Take 81 mg by mouth every evening.   Yes [provider]  atorvastatin (LIPITOR) 80 MG tablet Take 1 tablet (80 mg total) by mouth daily at 6 PM. 01/27/21  Yes Hilty, Nadean Corwin, MD  Cholecalciferol (VITAMIN D3 PO) Take 1 tablet by mouth daily.   Yes [provider]  lidocaine-prilocaine (EMLA) cream Apply 1 application topically as needed. Apply 0.33gm to PortaCath site 30 to 60 mins prior to PortaCath access. 07/27/21  Yes Truitt Merle, MD  lisinopril (ZESTRIL) 2.5 MG tablet Take 1 tablet (2.5 mg total) by mouth daily. 12/01/20  Yes Biagio Borg, MD  mirtazapine (REMERON) 7.5 MG tablet Take 1 tablet (7.5 mg total) by mouth at bedtime. 08/25/21  Yes Alla Feeling, NP  morphine (MS CONTIN) 15 MG 12 hr tablet Take 1 tablet (15 mg total) by mouth every 12 (twelve) hours. 08/25/21  Yes Alla Feeling, NP  nitroGLYCERIN (NITROSTAT) 0.4 MG SL tablet Place 1  tablet (0.4 mg total) under the tongue every 5 (five) minutes as needed for chest pain. Max 3 doses. 03/13/18  Yes Hilty, Nadean Corwin, MD  Omega-3 Fatty Acids (FISH OIL) 500 MG CAPS Take 500 mg by mouth daily.   Yes [provider]  oxyCODONE (OXY IR/ROXICODONE) 5 MG immediate release tablet Take 1 tablet (5 mg total) by mouth every 4 (four) hours as needed for severe pain. 08/12/21  Yes Truitt Merle, MD  capecitabine (XELODA) 500 MG tablet Take 3 tablets every 12 hours, for 14 days then off for 7 days. Take after meal. 08/21/21   Truitt Merle, MD  HYDROcodone-acetaminophen (NORCO) 5-325 MG tablet Take 1 tablet by mouth every 4 (four) hours as needed for moderate pain. 08/05/21   Truitt Merle, MD  ondansetron (ZOFRAN) 8 MG tablet Take 1 tablet (8 mg total) by mouth every 8 (eight) hours as needed for nausea or vomiting. 07/27/21   Truitt Merle, MD  prochlorperazine (COMPAZINE) 10 MG tablet Take 1 tablet (10 mg total) by mouth every 6 (six) hours as needed for nausea or vomiting. 07/27/21   Truitt Merle, MD  traMADol (ULTRAM) 50 MG tablet Take 1 tablet (50 mg total) by mouth every 6 (six) hours as needed. Patient not taking: Reported on 08/25/2021 07/21/21   Lincoln Brigham, PA-C      Allergies    Ofloxacin    Review  of Systems   Review of Systems  Constitutional:  Positive for fatigue and fever.  Respiratory:  Positive for shortness of breath.   All other systems reviewed and are negative.  Physical Exam Updated Vital Signs BP 126/74    Pulse (!) 108    Temp 98.1 F (36.7 C)    Resp (!) 25    Ht 6\' 1"  (1.854 m)    Wt 83.6 kg    SpO2 95%    BMI 24.32 kg/m  Physical Exam Vitals and nursing note reviewed.  Constitutional:      General: He is not in acute distress.    Appearance: He is well-developed.  HENT:     Head: Normocephalic and atraumatic.  Eyes:     Conjunctiva/sclera: Conjunctivae normal.  Cardiovascular:     Rate and Rhythm: Normal rate.     Pulses: Normal pulses.  Pulmonary:      Comments: Marland Kitchen  Mild tachypnea but no distress Abdominal:     Palpations: Abdomen is soft.     Tenderness: There is no abdominal tenderness.  Musculoskeletal:        General: No swelling.     Cervical back: Neck supple.  Skin:    General: Skin is warm and dry.     Capillary Refill: Capillary refill takes less than 2 seconds.  Neurological:     Mental Status: He is alert.  Psychiatric:        Mood and Affect: Mood normal.    ED Results / Procedures / Treatments   Labs (all labs ordered are listed, but only abnormal results are displayed) Labs Reviewed  LACTIC ACID, PLASMA - Abnormal; Notable for the following components:      Result Value   Lactic Acid, Venous 2.2 (*)    All other components within normal limits  COMPREHENSIVE METABOLIC PANEL - Abnormal; Notable for the following components:   CO2 21 (*)    Glucose, Bld 113 (*)    Calcium 8.5 (*)    Total Protein 6.1 (*)    Albumin 2.7 (*)    AST 116 (*)    ALT 71 (*)    Alkaline Phosphatase 511 (*)    All other components within normal limits  CBC WITH DIFFERENTIAL/PLATELET - Abnormal; Notable for the following components:   RBC 3.45 (*)    Hemoglobin 10.5 (*)    HCT 32.6 (*)    Platelets 407 (*)    Neutro Abs 9.9 (*)    Lymphs Abs 0.1 (*)    All other components within normal limits  TROPONIN I (HIGH SENSITIVITY) - Abnormal; Notable for the following components:   Troponin I (High Sensitivity) 62 (*)    All other components within normal limits  TROPONIN I (HIGH SENSITIVITY) - Abnormal; Notable for the following components:   Troponin I (High Sensitivity) 114 (*)    All other components within normal limits  CULTURE, BLOOD (ROUTINE X 2)  CULTURE, BLOOD (ROUTINE X 2)  LACTIC ACID, PLASMA  PROTIME-INR  URINALYSIS, ROUTINE W REFLEX MICROSCOPIC  CBC    EKG None  Radiology CT Angio Chest Pulmonary Embolism (PE) W or WO Contrast  Result Date: 08/25/2021 CLINICAL DATA:  Pulmonary embolism (PE) suspected, high prob  short of breath, ca patient, eval for PE EXAM: CT ANGIOGRAPHY CHEST WITH CONTRAST TECHNIQUE: Multidetector CT imaging of the chest was performed using the standard protocol during bolus administration of intravenous contrast. Multiplanar CT image reconstructions and MIPs were obtained to evaluate the vascular  anatomy. RADIATION DOSE REDUCTION: This exam was performed according to the departmental dose-optimization program which includes automated exposure control, adjustment of the mA and/or kV according to patient size and/or use of iterative reconstruction technique. CONTRAST:  48mL OMNIPAQUE IOHEXOL 350 MG/ML SOLN COMPARISON:  CT angiography chest 07/01/2021 FINDINGS: Cardiovascular: Fair opacification of the pulmonary arteries to the segmental level. Right middle lobe segmental subsegmental pulmonary embolus. Chronic enlarged right to left ventricular ratio 1.4. No significant pericardial effusion. The ascending thoracic aorta is enlarged in caliber measuring up to 4.6 cm. The descending thoracic aorta is normal in caliber. At least mild atherosclerotic plaque of the thoracic aorta. Left anterior descending coronary artery calcifications. Mediastinum/Nodes: Stable internal mammary 0.5 cm lymph node on the left (5:86). Stable other inferior measuring 0.3 cm (5:121). Asymmetrically prominent left axillary lymph nodes compared to the right. No enlarged mediastinal, hilar, or axillary lymph nodes. Thyroid gland, trachea, and esophagus demonstrate no significant findings. Lungs/Pleura: Trace biapical paraseptal emphysematous changes. Biapical pleural/pulmonary scarring. No focal consolidation. Stable scattered pulmonary micronodule within the right lung. Stable 6 mm left lower lobe pulmonary nodule (6:118). Stable 6 mm right lower lobe pulmonary nodule (6:78). No new pulmonary nodule. Pulmonary mass. No pleural effusion. No pneumothorax. Upper Abdomen: Redemonstration of innumerable ill-defined hypodensities within  the hepatic parenchyma consistent with metastases. Musculoskeletal: No abdominal wall hernia or abnormality. No suspicious lytic or blastic osseous lesions. No acute displaced fracture. Multilevel degenerative changes of the spine. Review of the MIP images confirms the above findings. IMPRESSION: 1. Right middle lobe segmental and subsegmental pulmonary embolus. Enlarged right to left ventricular ratio 1.4 suggestive of right heart strain. No pulmonary infarction. 2. Stable ascending thoracic aorta aneurysm (4.6 cm). Ascending thoracic aortic aneurysm. Recommend semi-annual imaging followup by CTA or MRA and referral to cardiothoracic surgery if not already obtained. This recommendation follows 2010 ACCF/AHA/AATS/ACR/ASA/SCA/SCAI/SIR/STS/SVM Guidelines for the Diagnosis and Management of Patients With Thoracic Aortic Disease. Circulation. 2010; 121: D664-Q034. Aortic aneurysm NOS (ICD10-I71.9) 3. Hepatic metastases poorly visualized. 4. Asymmetrically prominent left axillary lymph nodes compared to the right. 5. Couple of stable subcentimeter left internal mammary lymph nodes. 6. Stable 6 mm bilateral lower lobe pulmonary nodules. Stable scattered pulmonary micronodules within the right lung. 7. Aortic Atherosclerosis (ICD10-I70.0) and Emphysema (ICD10-J43.9). These results were called by telephone at the time of interpretation on 08/25/2021 at 10:11 pm to provider [facility] , who verbally acknowledged these results. Electronically Signed   By: Iven Finn M.D.   On: 08/25/2021 22:21   CT CHEST ABDOMEN PELVIS W CONTRAST  Addendum Date: 08/25/2021   ADDENDUM REPORT: 08/25/2021 11:30 ADDENDUM: Upper normal 10 mm short axis left thoracic inlet node on 04/02. 11 mm short axis left supraclavicular node on image 3/2 is upper normal to mildly enlarged. Electronically Signed   By: Misty Stanley M.D.   On: 08/25/2021 11:30   Result Date: 08/25/2021 CLINICAL DATA:  Gastric cancer.  Staging. EXAM: CT CHEST,  ABDOMEN, AND PELVIS WITH CONTRAST TECHNIQUE: Multidetector CT imaging of the chest, abdomen and pelvis was performed following the standard protocol during bolus administration of intravenous contrast. RADIATION DOSE REDUCTION: This exam was performed according to the departmental dose-optimization program which includes automated exposure control, adjustment of the mA and/or kV according to patient size and/or use of iterative reconstruction technique. CONTRAST:  140mL OMNIPAQUE IOHEXOL 300 MG/ML  SOLN COMPARISON:  07/01/2021. FINDINGS: CT CHEST FINDINGS Cardiovascular: The heart size is normal. No substantial pericardial effusion. Coronary artery calcification is evident. Mild atherosclerotic calcification  is noted in the wall of the thoracic aorta. Ascending thoracic aorta measures 4.5 cm diameter. Right Port-A-Cath tip is positioned in the distal SVC. Mediastinum/Nodes: 16 mm short axis precarinal node has increased from 6 mm (remeasured) previously. 10 mm short axis right hilar lymph node is mildly progressive in the interval. The esophagus has normal imaging features. There is no axillary lymphadenopathy. Lungs/Pleura: Biapical pleuroparenchymal scarring noted. Numerous bilateral pulmonary nodules identified, similar to prior. Index right lower pulmonary nodule measures 6 mm on image 72/5. Index nodule posterior left lower lobe is 6 mm on 83/5. Index right lower lobe nodule measuring 9 mm identified on image 90/5. Index nodule left lower lobe on 01/12/5 measures 5 mm. No focal consolidation.  Trace left pleural effusion evident. Musculoskeletal: No worrisome lytic or sclerotic osseous abnormality. CT ABDOMEN PELVIS FINDINGS Hepatobiliary: Innumerable hepatic metastases are again noted. One of the more dominant lesions is identified in the lateral segment left liver on image 67/2 measuring 4.8 x 2.7 cm today compared to 4.3 x 2.5 cm when remeasured in a similar fashion on the prior study. Another dominant  lesion identified in segment IV measures 5.0 x 4.1 cm on 59/2, clearly increased from 2.7 x 1.8 cm when remeasured on the prior study. 3.0 cm posterior right hepatic lobe lesion on 57/2 was 2.2 cm previously (remeasured). 2.5 cm lesion in the medial right liver adjacent to the IVC has increased from 1.1 cm previously. 6.5 x 4.0 cm lesion inferior tip right liver (82/2 was measured on the previous study at 4.8 x 3.1 cm. There is no evidence for gallstones, gallbladder wall thickening, or pericholecystic fluid. No intrahepatic or extrahepatic biliary dilation. Pancreas: No focal mass lesion. No dilatation of the main duct. No intraparenchymal cyst. No peripancreatic edema. Spleen: Small infarct noted inferior spleen. (Axial 62/2 and coronal 100/6). Adrenals/Urinary Tract: No adrenal nodule or mass. Kidneys unremarkable. No evidence for hydroureter. The urinary bladder appears normal for the degree of distention. Stomach/Bowel: Stomach is decompressed. Duodenum is normally positioned as is the ligament of Treitz. No small bowel wall thickening. No small bowel dilatation. The terminal ileum is normal. The appendix is not well visualized, but there is no edema or inflammation in the region of the cecum. No gross colonic mass. No colonic wall thickening. Vascular/Lymphatic: There is mild atherosclerotic calcification of the abdominal aorta without aneurysm. Necrotic lymphadenopathy again identified in the hepatoduodenal ligament. 3.2 x 2.1 cm node visible on 63/2 was measured previously at 2.8 x 2.3 cm. 2.0 cm short axis aortocaval node seen on 72/2. 13 mm short axis right retrocrural node evident. No pelvic sidewall lymphadenopathy. Reproductive: The prostate gland and seminal vesicles are unremarkable. Other: No intraperitoneal free fluid. Musculoskeletal: Small right groin hernia contains small bowel without complicating features. There is a left groin hernia containing a short segment of the sigmoid colon without  complicating features. No worrisome lytic or sclerotic osseous abnormality. IMPRESSION: 1. Mild interval progression of mediastinal and right hilar lymphadenopathy. 2. Similar appearance of numerous bilateral pulmonary nodules compatible with metastatic disease. 3. Interval progression of innumerable hepatic metastases. 4. Similar to mildly progressive necrotic lymphadenopathy in the hepatoduodenal ligament and aortocaval space. 5. Small inferior splenic infarct, new since prior. 6. Bilateral groin hernias containing small bowel and colon, respectively, without complicating features. 7. 4.5 cm ascending thoracic aortic aneurysm. Recommend semi-annual imaging followup by CTA or MRA and referral to cardiothoracic surgery if not already obtained. 2010; 121: [id number]. 8. Aortic Atherosclerosis (ICD10-I70.0). Electronically Signed: By: Randall Hiss  Tery Sanfilippo M.D. On: 08/25/2021 10:01   DG Chest Port 1 View  Result Date: 08/25/2021 CLINICAL DATA:  Weakness, fever, stage IV gastric cancer EXAM: PORTABLE CHEST 1 VIEW COMPARISON:  08/24/2021, 07/01/2021 FINDINGS: Two frontal views of the chest demonstrate right chest wall port tip overlying superior vena cava. The cardiac silhouette is unremarkable. No acute airspace disease, effusion, or pneumothorax. The pulmonary nodule seen on preceding chest CT are not well visualized by x-ray. No acute bony abnormalities. IMPRESSION: 1. No acute airspace disease. 2. Please refer to CT chest performed 08/24/2021 describing intrathoracic metastatic disease not apparent by x-ray. Electronically Signed   By: Randa Ngo M.D.   On: 08/25/2021 18:13    Procedures .Critical Care Performed by: Lucrezia Starch, MD Authorized by: Lucrezia Starch, MD   Critical care provider statement:    Critical care time (minutes):  48   Critical care was time spent personally by me on the following activities:  Development of treatment plan with patient or surrogate, discussions with consultants,  evaluation of patient's response to treatment, examination of patient, ordering and review of laboratory studies, ordering and review of radiographic studies, ordering and performing treatments and interventions, pulse oximetry, re-evaluation of patient's condition and review of old charts    Medications Ordered in ED Medications  0.9 %  sodium chloride infusion ( Intravenous New Bag/Given 08/25/21 1835)  vancomycin (VANCOREADY) IVPB 1500 mg/300 mL (1,500 mg Intravenous New Bag/Given 08/25/21 2122)  fentaNYL (SUBLIMAZE) injection 50 mcg (0 mcg Intravenous Hold 08/25/21 1909)  heparin ADULT infusion 100 units/mL (25000 units/265mL) (1,350 Units/hr Intravenous New Bag/Given 08/25/21 2300)  sodium chloride 0.9 % bolus 1,000 mL (0 mLs Intravenous Stopped 08/25/21 1940)  ceFEPIme (MAXIPIME) 2 g in sodium chloride 0.9 % 100 mL IVPB (0 g Intravenous Stopped 08/25/21 2041)  metroNIDAZOLE (FLAGYL) IVPB 500 mg (0 mg Intravenous Stopped 08/25/21 2122)  sodium chloride 0.9 % bolus 1,000 mL (0 mLs Intravenous Stopped 08/25/21 1940)  sodium chloride 0.9 % bolus 1,000 mL (0 mLs Intravenous Stopped 08/25/21 2122)  morphine (MS CONTIN) 12 hr tablet 15 mg (15 mg Oral Given 08/25/21 2116)  iohexol (OMNIPAQUE) 350 MG/ML injection 80 mL (80 mLs Intravenous Contrast Given 08/25/21 2139)  heparin bolus via infusion 2,500 Units (2,500 Units Intravenous Bolus from Bag 08/25/21 2256)    ED Course/ Medical Decision Making/ A&P                           Medical Decision Making Amount and/or Complexity of Data Reviewed Labs: ordered. Radiology: ordered. ECG/medicine tests: ordered.  Risk Prescription drug management. Decision regarding hospitalization.   81 year old gentleman presenting to the emergency room with concern for fever.  Notable past medical history of metastatic gastric adenocarcinoma, on chemotherapy.  Reported fever at home..  Broad differential considered.  Given immunocompromise state, fever, port in place,  concern for possibility of infection, sepsis, obtain broad infectious work-up.  Discussed case with oncology, [place], could be drug reaction from treatment today.  Initially normotensive but had a couple bouts of hypotension.  Provided large fluid bolus with improvement in his blood pressure.  Additionally initiated broad-spectrum antibiotics and obtain blood cultures.  Infectious work-up grossly stable, no leukocytosis, only slight elevation in lactate.  Chest x-ray negative for pneumonia.  Abdomen soft, LFT elevation unchanged from prior.  Given his slight hypoxia with transient hypotension, slight elevation in troponin, concern for PE.  Hypercoagulable from his cancer.  Obtain CT CTA chest.  Positive for pulmonary embolism.  Personally reviewed CT imaging and agree with radiology.  Radiologist did comment that patient has evidence of right heart strain but this was a chronic finding seen on prior CTs.  Discussed case with pulmonology, recommend anticoagulation, no indication for thrombolytics at present, states if medicine wants a formal pulmonology consult that the pulmonology team should be re contacted.  Will admit to medicine.  Discussed with Dr. Linda Hedges with the [facility] service.  Started heparin.  Additional history obtained from chart review, discussion with wife at bedside.  Wife and patient updated throughout stay.  Agreeable to admission.        Final Clinical Impression(s) / ED Diagnoses Final diagnoses:  Other pulmonary embolism without acute cor pulmonale, unspecified chronicity (HCC)  Lactic acid increased  Elevated troponin    Rx / DC Orders ED Discharge Orders     None         Lucrezia Starch, MD 08/25/21 2302    Lucrezia Starch, MD 08/26/21 [phone]

## 2021-08-25 NOTE — ED Notes (Signed)
EDP at the bedside to re-eval.

## 2021-08-25 NOTE — Progress Notes (Signed)
Nutrition follow-up completed with patient during infusion for metastatic gastric cancer.  Noted weight decreased and documented as 184.3 pounds  January 19 down from 194 pounds December 15.  Noted labs: Glucose 111 and albumin 3.2.  Patient reports he has decreased energy with increased fatigue.   He denies nausea, vomiting, constipation, and diarrhea. Reports appetite has slightly decreased. He eats seafood and other protein foods. He does not have a lot of energy to cook at this time.  Nutrition diagnosis: Food and nutrition related knowledge deficit improved.  Intervention: Encourage patient to try to add snacks between meals. Reviewed high-calorie options. Suggested oral nutrition supplements or high-calorie milkshake. Reviewed information on cold sensitivity side effects secondary to oxaliplatin. Questions answered.  Teach back method used.  Monitoring, evaluation, goals: Patient will work to increase calories and protein to minimize further weight loss.  Next visit: Thursday, February 2 during infusion.  **Disclaimer: This note was dictated with voice recognition software. Similar sounding words can inadvertently be transcribed and this note may contain transcription errors which may not have been corrected upon publication of note.**

## 2021-08-25 NOTE — ED Triage Notes (Signed)
Pt here from home with c/o weakness fever and pain in his back , pt just dx with stage 4 stomach ca around thanksgiving of 22 , second  chemo treatment started today

## 2021-08-26 ENCOUNTER — Encounter: Payer: Self-pay | Admitting: Hematology

## 2021-08-26 ENCOUNTER — Encounter (HOSPITAL_COMMUNITY): Payer: Self-pay | Admitting: Internal Medicine

## 2021-08-26 ENCOUNTER — Inpatient Hospital Stay (HOSPITAL_COMMUNITY): Payer: Medicare HMO

## 2021-08-26 DIAGNOSIS — C7800 Secondary malignant neoplasm of unspecified lung: Secondary | ICD-10-CM | POA: Diagnosis present

## 2021-08-26 DIAGNOSIS — K661 Hemoperitoneum: Secondary | ICD-10-CM | POA: Diagnosis not present

## 2021-08-26 DIAGNOSIS — I2693 Single subsegmental pulmonary embolism without acute cor pulmonale: Secondary | ICD-10-CM | POA: Diagnosis not present

## 2021-08-26 DIAGNOSIS — I10 Essential (primary) hypertension: Secondary | ICD-10-CM | POA: Diagnosis present

## 2021-08-26 DIAGNOSIS — I2510 Atherosclerotic heart disease of native coronary artery without angina pectoris: Secondary | ICD-10-CM | POA: Diagnosis not present

## 2021-08-26 DIAGNOSIS — C787 Secondary malignant neoplasm of liver and intrahepatic bile duct: Secondary | ICD-10-CM | POA: Diagnosis present

## 2021-08-26 DIAGNOSIS — R29818 Other symptoms and signs involving the nervous system: Secondary | ICD-10-CM | POA: Diagnosis not present

## 2021-08-26 DIAGNOSIS — M96841 Postprocedural hematoma of a musculoskeletal structure following other procedure: Secondary | ICD-10-CM | POA: Diagnosis not present

## 2021-08-26 DIAGNOSIS — I2609 Other pulmonary embolism with acute cor pulmonale: Secondary | ICD-10-CM | POA: Diagnosis not present

## 2021-08-26 DIAGNOSIS — D62 Acute posthemorrhagic anemia: Secondary | ICD-10-CM | POA: Diagnosis not present

## 2021-08-26 DIAGNOSIS — Z66 Do not resuscitate: Secondary | ICD-10-CM | POA: Diagnosis not present

## 2021-08-26 DIAGNOSIS — Z515 Encounter for palliative care: Secondary | ICD-10-CM | POA: Diagnosis not present

## 2021-08-26 DIAGNOSIS — Z86718 Personal history of other venous thrombosis and embolism: Secondary | ICD-10-CM | POA: Diagnosis not present

## 2021-08-26 DIAGNOSIS — Y848 Other medical procedures as the cause of abnormal reaction of the patient, or of later complication, without mention of misadventure at the time of the procedure: Secondary | ICD-10-CM | POA: Diagnosis not present

## 2021-08-26 DIAGNOSIS — R579 Shock, unspecified: Secondary | ICD-10-CM | POA: Diagnosis not present

## 2021-08-26 DIAGNOSIS — J9601 Acute respiratory failure with hypoxia: Secondary | ICD-10-CM | POA: Diagnosis not present

## 2021-08-26 DIAGNOSIS — E782 Mixed hyperlipidemia: Secondary | ICD-10-CM | POA: Diagnosis present

## 2021-08-26 DIAGNOSIS — I2699 Other pulmonary embolism without acute cor pulmonale: Secondary | ICD-10-CM

## 2021-08-26 DIAGNOSIS — D649 Anemia, unspecified: Secondary | ICD-10-CM

## 2021-08-26 DIAGNOSIS — R5081 Fever presenting with conditions classified elsewhere: Secondary | ICD-10-CM | POA: Diagnosis present

## 2021-08-26 DIAGNOSIS — Z6824 Body mass index (BMI) 24.0-24.9, adult: Secondary | ICD-10-CM | POA: Diagnosis not present

## 2021-08-26 DIAGNOSIS — D72829 Elevated white blood cell count, unspecified: Secondary | ICD-10-CM | POA: Diagnosis present

## 2021-08-26 DIAGNOSIS — E872 Acidosis, unspecified: Secondary | ICD-10-CM | POA: Diagnosis not present

## 2021-08-26 DIAGNOSIS — R778 Other specified abnormalities of plasma proteins: Secondary | ICD-10-CM | POA: Diagnosis not present

## 2021-08-26 DIAGNOSIS — Z20822 Contact with and (suspected) exposure to covid-19: Secondary | ICD-10-CM | POA: Diagnosis not present

## 2021-08-26 DIAGNOSIS — R531 Weakness: Secondary | ICD-10-CM | POA: Diagnosis not present

## 2021-08-26 DIAGNOSIS — I252 Old myocardial infarction: Secondary | ICD-10-CM | POA: Diagnosis not present

## 2021-08-26 DIAGNOSIS — G589 Mononeuropathy, unspecified: Secondary | ICD-10-CM | POA: Diagnosis present

## 2021-08-26 DIAGNOSIS — R0602 Shortness of breath: Secondary | ICD-10-CM | POA: Diagnosis not present

## 2021-08-26 DIAGNOSIS — R578 Other shock: Secondary | ICD-10-CM

## 2021-08-26 DIAGNOSIS — D709 Neutropenia, unspecified: Secondary | ICD-10-CM | POA: Diagnosis present

## 2021-08-26 DIAGNOSIS — R627 Adult failure to thrive: Secondary | ICD-10-CM | POA: Diagnosis present

## 2021-08-26 DIAGNOSIS — C161 Malignant neoplasm of fundus of stomach: Secondary | ICD-10-CM | POA: Diagnosis not present

## 2021-08-26 DIAGNOSIS — M5126 Other intervertebral disc displacement, lumbar region: Secondary | ICD-10-CM | POA: Diagnosis not present

## 2021-08-26 DIAGNOSIS — M25551 Pain in right hip: Secondary | ICD-10-CM | POA: Diagnosis not present

## 2021-08-26 DIAGNOSIS — M79669 Pain in unspecified lower leg: Secondary | ICD-10-CM | POA: Diagnosis not present

## 2021-08-26 DIAGNOSIS — S7011XA Contusion of right thigh, initial encounter: Secondary | ICD-10-CM | POA: Diagnosis not present

## 2021-08-26 DIAGNOSIS — R570 Cardiogenic shock: Secondary | ICD-10-CM | POA: Diagnosis not present

## 2021-08-26 DIAGNOSIS — C779 Secondary and unspecified malignant neoplasm of lymph node, unspecified: Secondary | ICD-10-CM | POA: Diagnosis not present

## 2021-08-26 DIAGNOSIS — S300XXA Contusion of lower back and pelvis, initial encounter: Secondary | ICD-10-CM | POA: Diagnosis not present

## 2021-08-26 DIAGNOSIS — C169 Malignant neoplasm of stomach, unspecified: Secondary | ICD-10-CM | POA: Diagnosis not present

## 2021-08-26 DIAGNOSIS — I82431 Acute embolism and thrombosis of right popliteal vein: Secondary | ICD-10-CM | POA: Diagnosis not present

## 2021-08-26 LAB — ECHOCARDIOGRAM COMPLETE
AR max vel: 2.81 cm2
AV Area VTI: 2.64 cm2
AV Area mean vel: 2.63 cm2
AV Mean grad: 5 mmHg
AV Peak grad: 9.5 mmHg
Ao pk vel: 1.55 m/s
Area-P 1/2: 2.76 cm2
Height: 73 in
S' Lateral: 3.3 cm
Weight: 2948.8 oz

## 2021-08-26 LAB — CBC
HCT: 28.5 % — ABNORMAL LOW (ref 39.0–52.0)
HCT: 34.2 % — ABNORMAL LOW (ref 39.0–52.0)
HCT: 29.7 % — ABNORMAL LOW (ref 39.0–52.0)
Hemoglobin: 9 g/dL — ABNORMAL LOW (ref 13.0–17.0)
Hemoglobin: 10.8 g/dL — ABNORMAL LOW (ref 13.0–17.0)
Hemoglobin: 9.4 g/dL — ABNORMAL LOW (ref 13.0–17.0)
MCH: 30.3 pg (ref 26.0–34.0)
MCH: 30.4 pg (ref 26.0–34.0)
MCH: 30.2 pg (ref 26.0–34.0)
MCHC: 31.6 g/dL (ref 30.0–36.0)
MCHC: 31.6 g/dL (ref 30.0–36.0)
MCHC: 31.6 g/dL (ref 30.0–36.0)
MCV: 96 fL (ref 80.0–100.0)
MCV: 96.3 fL (ref 80.0–100.0)
MCV: 95.5 fL (ref 80.0–100.0)
Platelets: 319 10*3/uL (ref 150–400)
Platelets: 388 10*3/uL (ref 150–400)
Platelets: 305 10*3/uL (ref 150–400)
RBC: 2.97 MIL/uL — ABNORMAL LOW (ref 4.22–5.81)
RBC: 3.55 MIL/uL — ABNORMAL LOW (ref 4.22–5.81)
RBC: 3.11 MIL/uL — ABNORMAL LOW (ref 4.22–5.81)
RDW: 14 % (ref 11.5–15.5)
RDW: 14.1 % (ref 11.5–15.5)
RDW: 14.3 % (ref 11.5–15.5)
WBC: 16 10*3/uL — ABNORMAL HIGH (ref 4.0–10.5)
WBC: 19.6 10*3/uL — ABNORMAL HIGH (ref 4.0–10.5)
WBC: 16.8 10*3/uL — ABNORMAL HIGH (ref 4.0–10.5)
nRBC: 0 % (ref 0.0–0.2)
nRBC: 0 % (ref 0.0–0.2)
nRBC: 0 % (ref 0.0–0.2)

## 2021-08-26 LAB — BASIC METABOLIC PANEL
Anion gap: 7 (ref 5–15)
BUN: 24 mg/dL — ABNORMAL HIGH (ref 8–23)
CO2: 20 mmol/L — ABNORMAL LOW (ref 22–32)
Calcium: 7.4 mg/dL — ABNORMAL LOW (ref 8.9–10.3)
Chloride: 106 mmol/L (ref 98–111)
Creatinine, Ser: 1.17 mg/dL (ref 0.61–1.24)
GFR, Estimated: >60 mL/min (ref >60)
Glucose, Bld: 125 mg/dL — ABNORMAL HIGH (ref 70–99)
Potassium: 4.7 mmol/L (ref 3.5–5.1)
Sodium: 133 mmol/L — ABNORMAL LOW (ref 135–145)

## 2021-08-26 LAB — BLOOD GAS, ARTERIAL
Acid-base deficit: 9.1 mmol/L — ABNORMAL HIGH (ref 0.0–2.0)
Bicarbonate: 16 mmol/L — ABNORMAL LOW (ref 20.0–28.0)
Delivery systems: POSITIVE
FIO2: 100
O2 Saturation: 99.7 %
Patient temperature: 99.1
pCO2 arterial: 33.9 mmHg (ref 32.0–48.0)
pH, Arterial: 7.296 — ABNORMAL LOW (ref 7.350–7.450)
pO2, Arterial: 423 mmHg — ABNORMAL HIGH (ref 83.0–108.0)

## 2021-08-26 LAB — RESP PANEL BY RT-PCR (FLU A&B, COVID) ARPGX2
Influenza A by PCR: NEGATIVE
Influenza B by PCR: NEGATIVE
SARS Coronavirus 2 by RT PCR: NEGATIVE

## 2021-08-26 LAB — PROTIME-INR
INR: 1.5 — ABNORMAL HIGH (ref 0.8–1.2)
Prothrombin Time: 17.8 seconds — ABNORMAL HIGH (ref 11.4–15.2)

## 2021-08-26 LAB — HEPARIN LEVEL (UNFRACTIONATED)
Heparin Unfractionated: 0.28 IU/mL — ABNORMAL LOW (ref 0.30–0.70)
Heparin Unfractionated: 0.19 IU/mL — ABNORMAL LOW (ref 0.30–0.70)

## 2021-08-26 LAB — BRAIN NATRIURETIC PEPTIDE: B Natriuretic Peptide: 493.9 pg/mL — ABNORMAL HIGH (ref 0.0–100.0)

## 2021-08-26 LAB — LACTIC ACID, PLASMA
Lactic Acid, Venous: 3 mmol/L (ref 0.5–1.9)
Lactic Acid, Venous: 1.4 mmol/L (ref 0.5–1.9)

## 2021-08-26 LAB — TROPONIN I (HIGH SENSITIVITY)
Troponin I (High Sensitivity): 160 ng/L (ref <18)
Troponin I (High Sensitivity): 179 ng/L (ref <18)
Troponin I (High Sensitivity): 191 ng/L (ref <18)
Troponin I (High Sensitivity): 209 ng/L (ref <18)

## 2021-08-26 LAB — MRSA NEXT GEN BY PCR, NASAL: MRSA by PCR Next Gen: NEGATIVE — AB

## 2021-08-26 LAB — APTT: aPTT: 62 seconds — ABNORMAL HIGH (ref 24–36)

## 2021-08-26 IMAGING — DX DG CHEST 1V PORT
1 series · 1 of 1 positions shown · non-contrast
Comparison: Chest radiograph [DATE]

CLINICAL DATA: Shortness of breath, decreased O2 sats

EXAM:
PORTABLE CHEST 1 VIEW

[chest ap]
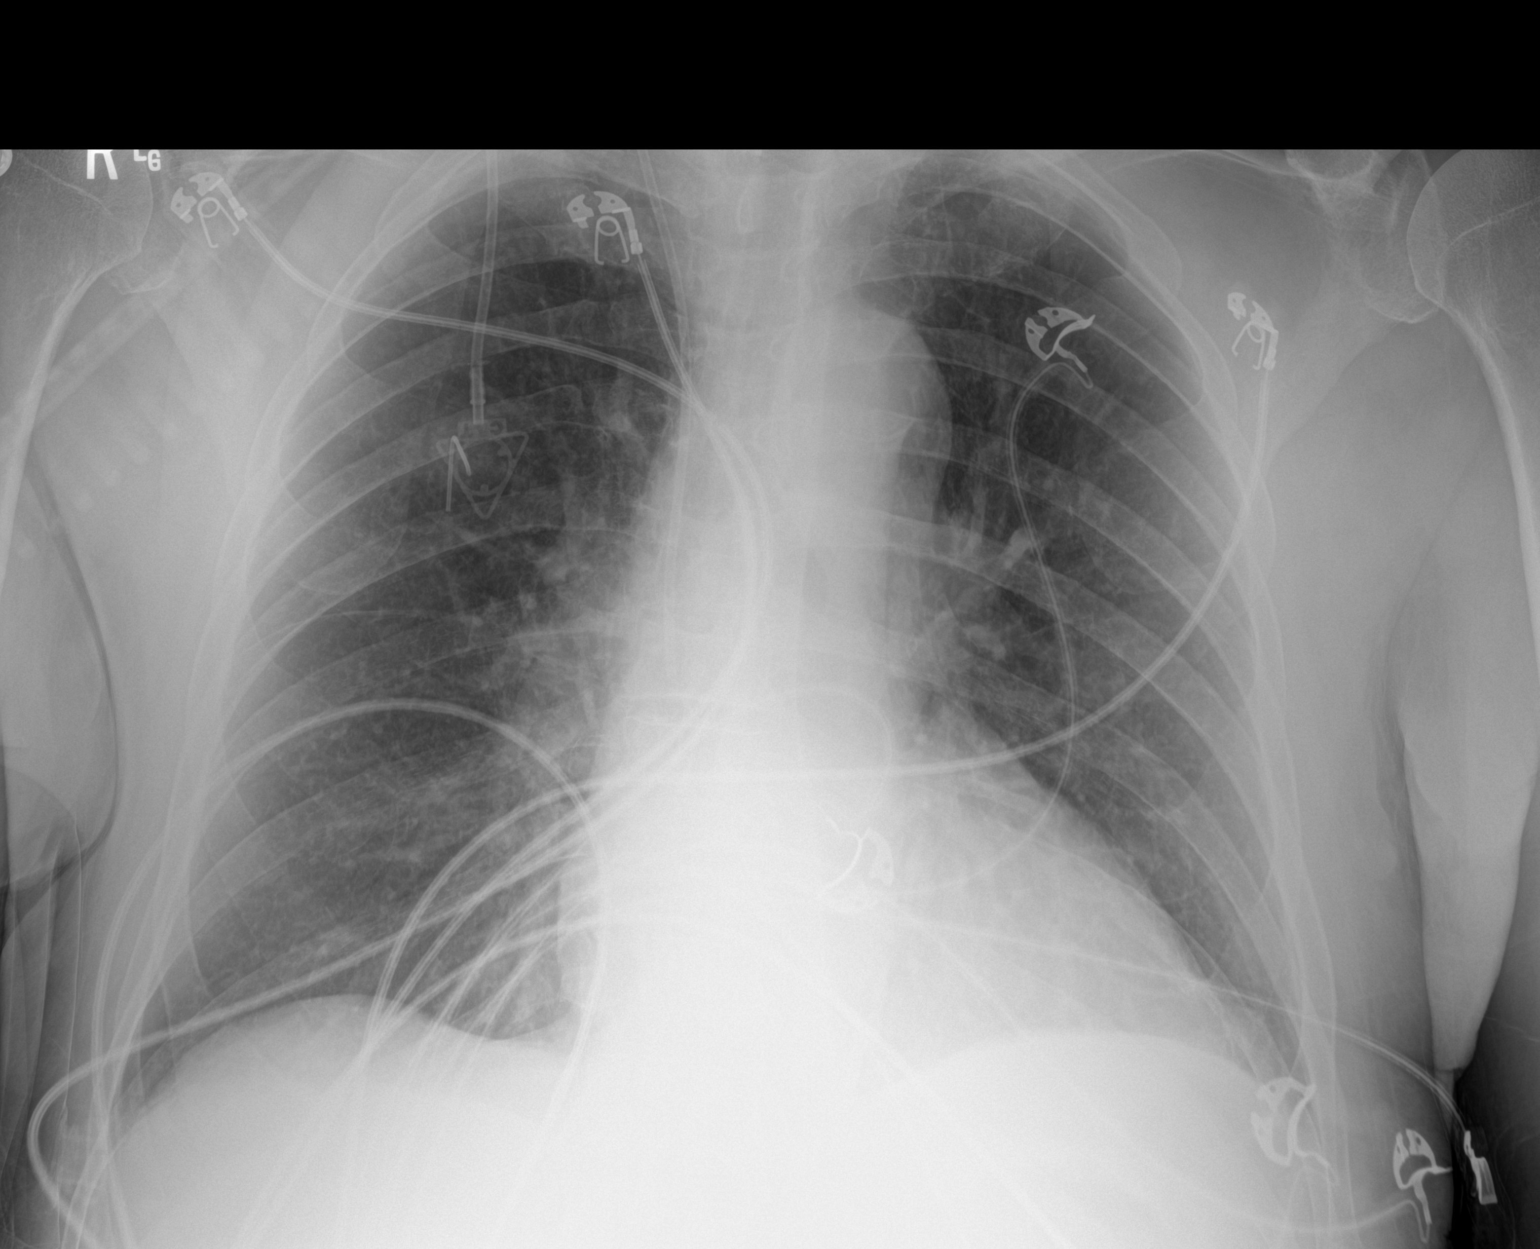

[1 of 1 positions shown; findings below may reference images not displayed]

FINDINGS: A right chest wall port is again seen with the tip at the cavoatrial
junction. The cardiomediastinal silhouette is stable.

There is no focal consolidation or pulmonary edema. There is no
pleural effusion or pneumothorax. Aeration is unchanged since
[DATE].

There is no acute osseous abnormality.
IMPRESSION: No radiographic evidence of acute cardiopulmonary process. No
significant interval change since [DATE]

## 2021-08-26 MED ORDER — CHLORHEXIDINE GLUCONATE CLOTH 2 % EX PADS
6.0000 | MEDICATED_PAD | Freq: Every day | CUTANEOUS | Status: DC
  Administered 2021-08-27 – 2021-09-03 (×7): 6 via TOPICAL

## 2021-08-26 MED ORDER — SODIUM CHLORIDE 0.9 % IV SOLN
2.0000 g | Freq: Three times a day (TID) | INTRAVENOUS | Status: DC
  Administered 2021-08-26: 2 g via INTRAVENOUS
  Filled 2021-08-26: qty 2

## 2021-08-26 MED ORDER — HEPARIN (PORCINE) 25000 UT/250ML-% IV SOLN
1350.0000 [IU]/h | INTRAVENOUS | Status: DC
  Administered 2021-08-26: 1000 [IU]/h via INTRAVENOUS
  Filled 2021-08-26: qty 250

## 2021-08-26 MED ORDER — ALTEPLASE (ACTIVASE) 10MG BOLUS + 40 MG INFUSION
50.0000 mg | Freq: Once | INTRAVENOUS | Status: AC
  Administered 2021-08-26: 50 mg via INTRAVENOUS
  Filled 2021-08-26: qty 50

## 2021-08-26 MED ORDER — FUROSEMIDE 10 MG/ML IJ SOLN
INTRAMUSCULAR | Status: AC
  Administered 2021-08-26: 40 mg via INTRAVENOUS
  Filled 2021-08-26: qty 4

## 2021-08-26 MED ORDER — VANCOMYCIN HCL 1500 MG/300ML IV SOLN
1500.0000 mg | INTRAVENOUS | Status: DC
  Administered 2021-08-26 – 2021-08-27 (×2): 1500 mg via INTRAVENOUS
  Filled 2021-08-26 (×2): qty 300

## 2021-08-26 MED ORDER — ALTEPLASE (PULMONARY EMBOLISM) INFUSION: 100.0000 mg | Freq: Once | INTRAVENOUS | Status: DC

## 2021-08-26 MED ORDER — METHYLPREDNISOLONE SODIUM SUCC 40 MG IJ SOLR
40.0000 mg | INTRAMUSCULAR | Status: DC
  Administered 2021-08-26 – 2021-08-27 (×2): 40 mg via INTRAVENOUS
  Filled 2021-08-26 (×2): qty 1

## 2021-08-26 MED ORDER — NOREPINEPHRINE 4 MG/250ML-% IV SOLN
2.0000 ug/min | INTRAVENOUS | Status: DC
  Administered 2021-08-26: 2 ug/min via INTRAVENOUS
  Filled 2021-08-26: qty 250

## 2021-08-26 MED ORDER — BOOST / RESOURCE BREEZE PO LIQD CUSTOM
1.0000 | ORAL | Status: DC
  Administered 2021-08-28 – 2021-08-30 (×2): 1 via ORAL

## 2021-08-26 MED ORDER — SODIUM CHLORIDE 0.45 % IV SOLN: INTRAVENOUS | Status: DC

## 2021-08-26 MED ORDER — ADULT MULTIVITAMIN W/MINERALS CH
1.0000 | ORAL_TABLET | Freq: Every day | ORAL | Status: DC
  Administered 2021-08-26 – 2021-09-03 (×9): 1 via ORAL
  Filled 2021-08-26 (×9): qty 1

## 2021-08-26 MED ORDER — LIP MEDEX EX OINT
TOPICAL_OINTMENT | CUTANEOUS | Status: DC | PRN
  Filled 2021-08-26: qty 7

## 2021-08-26 MED ORDER — DOCUSATE SODIUM 100 MG PO CAPS
100.0000 mg | ORAL_CAPSULE | Freq: Two times a day (BID) | ORAL | Status: DC
  Administered 2021-08-26 – 2021-09-03 (×14): 100 mg via ORAL
  Filled 2021-08-26 (×16): qty 1

## 2021-08-26 MED ORDER — DIPHENHYDRAMINE HCL 50 MG/ML IJ SOLN
12.5000 mg | Freq: Four times a day (QID) | INTRAMUSCULAR | Status: DC | PRN
  Administered 2021-08-26: 12.5 mg via INTRAVENOUS
  Filled 2021-08-26: qty 1

## 2021-08-26 MED ORDER — ACETAMINOPHEN 650 MG RE SUPP: 650.0000 mg | Freq: Four times a day (QID) | RECTAL | Status: DC | PRN

## 2021-08-26 MED ORDER — FUROSEMIDE 10 MG/ML IJ SOLN: 40.0000 mg | Freq: Once | INTRAMUSCULAR | Status: AC

## 2021-08-26 MED ORDER — SODIUM CHLORIDE 0.9 % IV BOLUS
500.0000 mL | Freq: Once | INTRAVENOUS | Status: AC
  Administered 2021-08-26: 500 mL via INTRAVENOUS

## 2021-08-26 MED ORDER — SODIUM CHLORIDE 0.9 % IV SOLN
250.0000 mL | Freq: Once | INTRAVENOUS | Status: AC
  Administered 2021-08-26: 250 mL via INTRAVENOUS

## 2021-08-26 MED ORDER — POLYETHYLENE GLYCOL 3350 17 G PO PACK
17.0000 g | PACK | Freq: Every day | ORAL | Status: DC
  Administered 2021-08-26 – 2021-09-03 (×7): 17 g via ORAL
  Filled 2021-08-26 (×9): qty 1

## 2021-08-26 MED ORDER — ACETAMINOPHEN 325 MG PO TABS
650.0000 mg | ORAL_TABLET | Freq: Four times a day (QID) | ORAL | Status: DC | PRN
  Administered 2021-08-26: 650 mg via ORAL
  Filled 2021-08-26: qty 2

## 2021-08-26 MED ORDER — ENSURE ENLIVE PO LIQD
237.0000 mL | Freq: Two times a day (BID) | ORAL | Status: DC
  Administered 2021-08-26 – 2021-09-03 (×13): 237 mL via ORAL

## 2021-08-26 MED ORDER — SENNA 8.6 MG PO TABS
1.0000 | ORAL_TABLET | Freq: Two times a day (BID) | ORAL | Status: DC
  Administered 2021-08-26 – 2021-09-02 (×13): 8.6 mg via ORAL
  Filled 2021-08-26 (×14): qty 1

## 2021-08-26 MED ORDER — SODIUM CHLORIDE 0.9 % IV SOLN
2.0000 g | Freq: Two times a day (BID) | INTRAVENOUS | Status: DC
  Administered 2021-08-26 – 2021-08-28 (×4): 2 g via INTRAVENOUS
  Filled 2021-08-26 (×4): qty 2

## 2021-08-26 MED ORDER — SODIUM CHLORIDE 0.9 % IV SOLN
250.0000 mL | INTRAVENOUS | Status: DC
  Administered 2021-08-26: 250 mL via INTRAVENOUS

## 2021-08-26 NOTE — Progress Notes (Addendum)
HEMATOLOGY-ONCOLOGY PROGRESS NOTE  ASSESSMENT AND PLAN: 1.  Metastatic gastric cancer 2.  Massive PE 3.  Fever 4.  Maculopapular rash thought to be due to pembrolizumab/trastuzumab 5.  Normocytic anemia 6.  Leukocytosis 7.  Goals of care discussion  -The patient is receiving systemic chemotherapy for his metastatic gastric cancer.  Infusional pump due to complete on 08/27/2020.  I have sent a message to the cancer center to determine who can discontinue this pump.  At this time, I do not have a definitive answer but I am being told that the IV team has been trying to do this.  Would recommend reaching out to the IV team tomorrow when the pump is due to be discontinued -He has been started on a heparin drip and is receiving tPA.  Management per PCCM/hospitalist.  Will need anticoagulation upon hospital discharge and can switch to DOAC -Leukocytosis may be related to infection.  He is not receiving G-CSF.  Follow-up on cultures.  Continue empiric antibiotics. -Rash likely due to pembrolizumab and trastuzumab.  Does not appear to be worsening compared to yesterday.  Recommend symptomatic management and may use Benadryl and steroids. -Hemoglobin down slightly to 9 today.  Monitor.  Transfuse per ICU protocol. -The patient has an incurable malignancy and overall is [place] [facility] gnosis is poor if he does not have a good response to chemotherapy.  Goal of care is palliative.  Agree with DNR/DNI.  Billy Bussing, DNP, AGPCNP-BC, AOCNP  SUBJECTIVE: Mr. Billy [name] is followed by our office for metastatic gastric cancer.  He is receiving systemic chemotherapy with FOLFOX.  Second cycle was started in our office on 08/25/2021.  His infusional 5-FU pump is still infusing.  About 3 hours after leaving our office last evening, he developed shortness of breath and a fever up to 102.  He came to the urgency department for further evaluation.  He has been started on broad-spectrum antibiotics due to concern for sepsis.  CTA  chest showed right subsegmental PE and also concerning for possible RV strain.  He was started on a heparin drip.  Troponins trending upward suggestive of at least submassive PE.  Had a near syncopal event this morning with worsening hypoxia and recurrent hypotension.  PCCM was consulted.  Planning for tPA.  This morning, he reports that his shortness of breath is reasonably well controlled with oxygen.  States chest pain is controlled with current pain medication.  Noted to have a rash on his chest and abdomen which he states is not new.  Review of the note from our office yesterday indicates that he has had this rash and it was thought to be due to to his pembrolizumab/trastuzumab.  He was given additional premedications in our office today including Singulair, Pepcid, and Benadryl was changed from p.o. to IV.  This morning, he states that the rash is not bothering him very much.  He is not sure if it is any worse than it was yesterday.  He is not itching.  No bleeding noted.  Oncology History  Gastric cancer ([place])  07/01/2021 Imaging   EXAM: CT ANGIOGRAPHY CHEST WITH CONTRAST  IMPRESSION: 1. No evidence of a pulmonary embolism. 2. Findings consistent with neoplastic disease. Multiple liver masses consistent with widespread hepatic metastatic disease. Ill-defined partly imaged mass along the porta hepatis suspicious for a pancreatic malignancy. There also multiple subcentimeter lung nodules consistent with metastatic disease. Recommend follow-up CT of the abdomen and pelvis with contrast for further assessment 3. No acute findings in the  chest. 4. Dilated ascending thoracic aorta to 4.7 cm. Ascending thoracic aortic aneurysm. Recommend semi-annual imaging followup by CTA or MRA and referral to cardiothoracic surgery if not already obtained. This recommendation follows 2010 ACCF/AHA/AATS/ACR/ASA/SCA/SCAI/SIR/STS/SVM Guidelines for the Diagnosis and Management of Patients With Thoracic Aortic  Disease. Circulation. 2010; 121: H474-Q595. Aortic aneurysm NOS (ICD10-I71.9)     07/01/2021 Imaging   EXAM: CT ABDOMEN AND PELVIS WITH CONTRAST  IMPRESSION: 1. Numerous hypodense hepatic lesions with dominant lesion in the inferior right lobe measuring up to 5.9 cm. Findings worrisome for diffuse metastatic disease. Primary hepatic neoplasm not excluded in the inferior right lobe. 2. Pathologically enlarged periportal, portal caval, peripancreatic and aortocaval lymph nodes worrisome for metastatic disease. 3. Bilateral inguinal hernias. Left inguinal hernia contains colon. No bowel obstruction. 4.  Aortic Atherosclerosis (ICD10-I70.0).   07/12/2021 Pathology Results   FINAL MICROSCOPIC DIAGNOSIS:   A. LEFT LIVER MASS, NEEDLE CORE BIOPSY:  Metastatic adenocarcinoma.  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   Comment: The morphologic features of the neoplasm are most consistent with metastasis from a primary colorectal adenocarcinoma.  However, possibility of primary in the upper GI tract and pancreaticobiliary tree cannot be excluded.  Markers for lung adenocarcinoma are negative.   ADDENDUM:  HER2 by immunohistochemistry is POSITIVE (3+).  ADDENDUM:  Mismatch Repair Protein (IHC)  SUMMARY INTERPRETATION: NORMAL   07/19/2021 Procedure   Upper Endoscopy, Dr. Fuller Plan  Impression: - Normal esophagus. - Malignant gastric tumor in the gastric fundus. Biopsied. - Normal duodenal bulb and second portion of the duodenum.   07/19/2021 Pathology Results   Diagnosis Stomach, biopsy, Mass - INVASIVE MODERATE TO POORLY DIFFERENTIATED ADENOCARCINOMA,   07/21/2021 Initial Diagnosis   Gastric cancer ([place])   08/11/2021 -  Chemotherapy   Patient is on Treatment Plan : GASTROESOPHAGEAL FOLFOX q14d x 6 cycles     08/18/2021 -  Chemotherapy   Patient is on Treatment Plan : Gastric - Herceptin & Keytruda Q 21 days     08/24/2021 Imaging   New baseline CT CAP  IMPRESSION: 1. Mild interval progression of mediastinal and right hilar lymphadenopathy. 2. Similar appearance of numerous bilateral pulmonary nodules compatible with metastatic disease. 3. Interval progression of innumerable hepatic metastases. 4. Similar to mildly progressive necrotic lymphadenopathy in the hepatoduodenal ligament and aortocaval space. 5. Small inferior splenic infarct, new since prior. 6. Bilateral groin hernias containing small bowel and colon, respectively, without complicating features. 7. 4.5 cm ascending thoracic aortic aneurysm. Recommend semi-annual imaging followup by CTA or MRA and referral to cardiothoracic surgery if not already obtained.    ADDENDUM: Upper normal 10 mm short axis left thoracic inlet node on 04/02. 11 mm short axis left supraclavicular node on image 3/2 is upper normal to mildly enlarged.      REVIEW OF SYSTEMS:   Review of Systems  Constitutional:  Positive for fever and malaise/fatigue.  HENT: Negative.    Eyes: Negative.   Respiratory:  Positive for shortness of breath.   Cardiovascular:  Positive for chest pain.       Chest pain controlled with current pain medication  Gastrointestinal:  Negative for abdominal pain, nausea and vomiting.  Skin:  Positive for rash. Negative for itching.  Neurological:        Had a presyncopal event this morning  Psychiatric/Behavioral: Negative.     I have reviewed the past medical history, past surgical history, social history and family history with the patient and they are unchanged from previous note.  PHYSICAL EXAMINATION: ECOG PERFORMANCE STATUS: 3 - Symptomatic, >50% confined to bed  Vitals:   08/26/21 0800 08/26/21 0845  BP: 108/63   Pulse: 71 76  Resp: 19 18  Temp:    SpO2: 97% 96%   Filed Weights   08/25/21 2211  Weight: 83.6 kg    Intake/Output from previous day: 01/19 0701 - 01/20 0700 In: 5412.5 [I.V.:1312.5; IV Piggyback:4100] Out: -   Physical Exam Vitals reviewed.   Constitutional:      Appearance: He is ill-appearing.  HENT:     Head: Normocephalic.     Mouth/Throat:     Pharynx: No oropharyngeal exudate.  Eyes:     General: No scleral icterus. Cardiovascular:     Rate and Rhythm: Normal rate.  Pulmonary:     Effort: Pulmonary effort is normal. No respiratory distress.     Breath sounds: Normal breath sounds.  Abdominal:     General: Abdomen is flat.     Palpations: Abdomen is soft.  Skin:    Findings: Rash present.     Comments: Maculopapular rash over his chest and abdomen  Neurological:     Mental Status: He is alert and oriented to person, place, and time.    LABORATORY DATA:  I have reviewed the data as listed CMP Latest Ref Rng & Units 08/26/2021 08/25/2021 08/25/2021  Glucose 70 - 99 mg/dL 125(H) 113(H) 111(H)  BUN 8 - 23 mg/dL 24(H) 22 18  Creatinine 0.61 - 1.24 mg/dL 1.17 1.06 0.98  Sodium 135 - 145 mmol/L 133(L) 135 136  Potassium 3.5 - 5.1 mmol/L 4.7 4.3 4.2  Chloride 98 - 111 mmol/L 106 104 103  CO2 22 - 32 mmol/L 20(L) 21(L) 25  Calcium 8.9 - 10.3 mg/dL 7.4(L) 8.5(L) 8.8(L)  Total Protein 6.5 - 8.1 g/dL - 6.1(L) 6.3(L)  Total Bilirubin 0.3 - 1.2 mg/dL - 0.6 0.7  Alkaline Phos 38 - 126 U/L - 511(H) 415(H)  AST 15 - 41 U/L - 116(H) 88(H)  ALT 0 - 44 U/L - 71(H) 59(H)    Lab Results  Component Value Date   WBC 19.6 (H) 08/26/2021   HGB 10.8 (L) 08/26/2021   HCT 34.2 (L) 08/26/2021   MCV 96.3 08/26/2021   PLT 388 08/26/2021   NEUTROABS 9.9 (H) 08/25/2021    Lab Results  Component Value Date   CEA1 1,626.33 (H) 08/10/2021   LOV564 53,813 (H) 07/06/2021   PSA1 2.4 07/06/2021    CT Angio Chest Pulmonary Embolism (PE) W or WO Contrast  Result Date: 08/25/2021 CLINICAL DATA:  Pulmonary embolism (PE) suspected, high prob short of breath, ca patient, eval for PE EXAM: CT ANGIOGRAPHY CHEST WITH CONTRAST TECHNIQUE: Multidetector CT imaging of the chest was performed using the standard protocol during bolus  administration of intravenous contrast. Multiplanar CT image reconstructions and MIPs were obtained to evaluate the vascular anatomy. RADIATION DOSE REDUCTION: This exam was performed according to the departmental dose-optimization program which includes automated exposure control, adjustment of the mA and/or kV according to patient size and/or use of iterative reconstruction technique. CONTRAST:  64m OMNIPAQUE IOHEXOL 350 MG/ML SOLN COMPARISON:  CT angiography chest 07/01/2021 FINDINGS: Cardiovascular: Fair opacification of the pulmonary arteries to the segmental level. Right middle lobe segmental subsegmental pulmonary embolus. Chronic enlarged right to left ventricular ratio 1.4. No significant pericardial effusion. The ascending thoracic aorta is enlarged in caliber measuring up to 4.6 cm. The descending thoracic aorta is normal in caliber. At least mild atherosclerotic plaque of the  thoracic aorta. Left anterior descending coronary artery calcifications. Mediastinum/Nodes: Stable internal mammary 0.5 cm lymph node on the left (5:86). Stable other inferior measuring 0.3 cm (5:121). Asymmetrically prominent left axillary lymph nodes compared to the right. No enlarged mediastinal, hilar, or axillary lymph nodes. Thyroid gland, trachea, and esophagus demonstrate no significant findings. Lungs/Pleura: Trace biapical paraseptal emphysematous changes. Biapical pleural/pulmonary scarring. No focal consolidation. Stable scattered pulmonary micronodule within the right lung. Stable 6 mm left lower lobe pulmonary nodule (6:118). Stable 6 mm right lower lobe pulmonary nodule (6:78). No new pulmonary nodule. Pulmonary mass. No pleural effusion. No pneumothorax. Upper Abdomen: Redemonstration of innumerable ill-defined hypodensities within the hepatic parenchyma consistent with metastases. Musculoskeletal: No abdominal wall hernia or abnormality. No suspicious lytic or blastic osseous lesions. No acute displaced fracture.  Multilevel degenerative changes of the spine. Review of the MIP images confirms the above findings. IMPRESSION: 1. Right middle lobe segmental and subsegmental pulmonary embolus. Enlarged right to left ventricular ratio 1.4 suggestive of right heart strain. No pulmonary infarction. 2. Stable ascending thoracic aorta aneurysm (4.6 cm). Ascending thoracic aortic aneurysm. Recommend semi-annual imaging followup by CTA or MRA and referral to cardiothoracic surgery if not already obtained. This recommendation follows 2010 ACCF/AHA/AATS/ACR/ASA/SCA/SCAI/SIR/STS/SVM Guidelines for the Diagnosis and Management of Patients With Thoracic Aortic Disease. Circulation. 2010; 121: [id number]. Aortic aneurysm NOS (ICD10-I71.9) 3. Hepatic metastases poorly visualized. 4. Asymmetrically prominent left axillary lymph nodes compared to the right. 5. Couple of stable subcentimeter left internal mammary lymph nodes. 6. Stable 6 mm bilateral lower lobe pulmonary nodules. Stable scattered pulmonary micronodules within the right lung. 7. Aortic Atherosclerosis (ICD10-I70.0) and Emphysema (ICD10-J43.9). These results were called by telephone at the time of interpretation on 08/25/2021 at 10:11 pm to provider [facility] , who verbally acknowledged these results. Electronically Signed   By: Iven Finn M.D.   On: 08/25/2021 22:21   CT CHEST ABDOMEN PELVIS W CONTRAST  Addendum Date: 08/25/2021   ADDENDUM REPORT: 08/25/2021 11:30 ADDENDUM: Upper normal 10 mm short axis left thoracic inlet node on 04/02. 11 mm short axis left supraclavicular node on image 3/2 is upper normal to mildly enlarged. Electronically Signed   By: Misty Stanley M.D.   On: 08/25/2021 11:30   Result Date: 08/25/2021 CLINICAL DATA:  Gastric cancer.  Staging. EXAM: CT CHEST, ABDOMEN, AND PELVIS WITH CONTRAST TECHNIQUE: Multidetector CT imaging of the chest, abdomen and pelvis was performed following the standard protocol during bolus administration of  intravenous contrast. RADIATION DOSE REDUCTION: This exam was performed according to the departmental dose-optimization program which includes automated exposure control, adjustment of the mA and/or kV according to patient size and/or use of iterative reconstruction technique. CONTRAST:  167m OMNIPAQUE IOHEXOL 300 MG/ML  SOLN COMPARISON:  07/01/2021. FINDINGS: CT CHEST FINDINGS Cardiovascular: The heart size is normal. No substantial pericardial effusion. Coronary artery calcification is evident. Mild atherosclerotic calcification is noted in the wall of the thoracic aorta. Ascending thoracic aorta measures 4.5 cm diameter. Right Port-A-Cath tip is positioned in the distal SVC. Mediastinum/Nodes: 16 mm short axis precarinal node has increased from 6 mm (remeasured) previously. 10 mm short axis right hilar lymph node is mildly progressive in the interval. The esophagus has normal imaging features. There is no axillary lymphadenopathy. Lungs/Pleura: Biapical pleuroparenchymal scarring noted. Numerous bilateral pulmonary nodules identified, similar to prior. Index right lower pulmonary nodule measures 6 mm on image 72/5. Index nodule posterior left lower lobe is 6 mm on 83/5. Index right lower lobe nodule measuring 9 mm identified  on image 90/5. Index nodule left lower lobe on 01/12/5 measures 5 mm. No focal consolidation.  Trace left pleural effusion evident. Musculoskeletal: No worrisome lytic or sclerotic osseous abnormality. CT ABDOMEN PELVIS FINDINGS Hepatobiliary: Innumerable hepatic metastases are again noted. One of the more dominant lesions is identified in the lateral segment left liver on image 67/2 measuring 4.8 x 2.7 cm today compared to 4.3 x 2.5 cm when remeasured in a similar fashion on the prior study. Another dominant lesion identified in segment IV measures 5.0 x 4.1 cm on 59/2, clearly increased from 2.7 x 1.8 cm when remeasured on the prior study. 3.0 cm posterior right hepatic lobe lesion on 57/2  was 2.2 cm previously (remeasured). 2.5 cm lesion in the medial right liver adjacent to the IVC has increased from 1.1 cm previously. 6.5 x 4.0 cm lesion inferior tip right liver (82/2 was measured on the previous study at 4.8 x 3.1 cm. There is no evidence for gallstones, gallbladder wall thickening, or pericholecystic fluid. No intrahepatic or extrahepatic biliary dilation. Pancreas: No focal mass lesion. No dilatation of the main duct. No intraparenchymal cyst. No peripancreatic edema. Spleen: Small infarct noted inferior spleen. (Axial 62/2 and coronal 100/6). Adrenals/Urinary Tract: No adrenal nodule or mass. Kidneys unremarkable. No evidence for hydroureter. The urinary bladder appears normal for the degree of distention. Stomach/Bowel: Stomach is decompressed. Duodenum is normally positioned as is the ligament of Treitz. No small bowel wall thickening. No small bowel dilatation. The terminal ileum is normal. The appendix is not well visualized, but there is no edema or inflammation in the region of the cecum. No gross colonic mass. No colonic wall thickening. Vascular/Lymphatic: There is mild atherosclerotic calcification of the abdominal aorta without aneurysm. Necrotic lymphadenopathy again identified in the hepatoduodenal ligament. 3.2 x 2.1 cm node visible on 63/2 was measured previously at 2.8 x 2.3 cm. 2.0 cm short axis aortocaval node seen on 72/2. 13 mm short axis right retrocrural node evident. No pelvic sidewall lymphadenopathy. Reproductive: The prostate gland and seminal vesicles are unremarkable. Other: No intraperitoneal free fluid. Musculoskeletal: Small right groin hernia contains small bowel without complicating features. There is a left groin hernia containing a short segment of the sigmoid colon without complicating features. No worrisome lytic or sclerotic osseous abnormality. IMPRESSION: 1. Mild interval progression of mediastinal and right hilar lymphadenopathy. 2. Similar appearance of  numerous bilateral pulmonary nodules compatible with metastatic disease. 3. Interval progression of innumerable hepatic metastases. 4. Similar to mildly progressive necrotic lymphadenopathy in the hepatoduodenal ligament and aortocaval space. 5. Small inferior splenic infarct, new since prior. 6. Bilateral groin hernias containing small bowel and colon, respectively, without complicating features. 7. 4.5 cm ascending thoracic aortic aneurysm. Recommend semi-annual imaging followup by CTA or MRA and referral to cardiothoracic surgery if not already obtained. 2010; 121: [id number]. 8. Aortic Atherosclerosis (ICD10-I70.0). Electronically Signed: By: Misty Stanley M.D. On: 08/25/2021 10:01   DG CHEST PORT 1 VIEW  Result Date: 08/26/2021 CLINICAL DATA:  Shortness of breath, decreased O2 sats EXAM: PORTABLE CHEST 1 VIEW COMPARISON:  Chest radiograph 08/25/2021 FINDINGS: A right chest wall port is again seen with the tip at the cavoatrial junction. The cardiomediastinal silhouette is stable. There is no focal consolidation or pulmonary edema. There is no pleural effusion or pneumothorax. Aeration is unchanged since 08/25/2021. There is no acute osseous abnormality. IMPRESSION: No radiographic evidence of acute cardiopulmonary process. No significant interval change since 08/25/2021 Electronically Signed   By: Court Joy.D.  On: 08/26/2021 07:06   DG Chest Port 1 View  Result Date: 08/25/2021 CLINICAL DATA:  Weakness, fever, stage IV gastric cancer EXAM: PORTABLE CHEST 1 VIEW COMPARISON:  08/24/2021, 07/01/2021 FINDINGS: Two frontal views of the chest demonstrate right chest wall port tip overlying superior vena cava. The cardiac silhouette is unremarkable. No acute airspace disease, effusion, or pneumothorax. The pulmonary nodule seen on preceding chest CT are not well visualized by x-ray. No acute bony abnormalities. IMPRESSION: 1. No acute airspace disease. 2. Please refer to CT chest performed 08/24/2021  describing intrathoracic metastatic disease not apparent by x-ray. Electronically Signed   By: Randa Ngo M.D.   On: 08/25/2021 18:13   ECHOCARDIOGRAM COMPLETE  Result Date: 08/16/2021    ECHOCARDIOGRAM REPORT   Patient Name:   Billy [name] Date of Exam: 08/16/2021 Medical Rec #:  [id number]      Height:       73.0 in Accession #:    [id number]     Weight:       189.6 lb Date of Birth:  1941-02-08      BSA:          2.103 m Patient Age:    81 years       BP:           124/90 mmHg Patient Gender: M              HR:           73 bpm. Exam Location:  [facility] Procedure: 2D Echo, Cardiac Doppler, Color Doppler and Strain Analysis Indications:    Z09 Chemo  History:        Patient has no prior history of Echocardiogram examinations.                 Previous Myocardial Infarction; Risk Factors:Hypertension and                 Dyslipidemia.  Sonographer:    Wilford Sports Rodgers-Jones RDCS Referring Phys: [id number] [place]  1. Left ventricular ejection fraction, by estimation, is 60 to 65%. The left ventricle has normal function. The left ventricle has no regional wall motion abnormalities. Left ventricular diastolic parameters are consistent with Grade I diastolic dysfunction (impaired relaxation). The average left ventricular global longitudinal strain is -23.0 %. The global longitudinal strain is normal.  2. Right ventricular systolic function is normal. The right ventricular size is mildly enlarged. There is normal pulmonary artery systolic pressure. The estimated right ventricular systolic pressure is 07.3 mmHg.  3. Right atrial size was mildly dilated.  4. The mitral valve is normal in structure. No evidence of mitral valve regurgitation. No evidence of mitral stenosis.  5. The aortic valve is normal in structure. There is mild calcification of the aortic valve. There is mild thickening of the aortic valve. Aortic valve regurgitation is mild. Aortic valve sclerosis/calcification is present, without  any evidence of aortic stenosis.  6. Aortic dilatation noted. There is moderate dilatation of the ascending aorta, measuring 46 mm.  7. The inferior vena cava is normal in size with greater than 50% respiratory variability, suggesting right atrial pressure of 3 mmHg. FINDINGS  Left Ventricle: Left ventricular ejection fraction, by estimation, is 60 to 65%. The left ventricle has normal function. The left ventricle has no regional wall motion abnormalities. The average left ventricular global longitudinal strain is -23.0 %. The global longitudinal strain is normal. The left ventricular internal cavity size was normal in size. There  is no left ventricular hypertrophy. Left ventricular diastolic parameters are consistent with Grade I diastolic dysfunction (impaired relaxation). Right Ventricle: The right ventricular size is mildly enlarged. No increase in right ventricular wall thickness. Right ventricular systolic function is normal. There is normal pulmonary artery systolic pressure. The tricuspid regurgitant velocity is 2.74  m/s, and with an assumed right atrial pressure of 3 mmHg, the estimated right ventricular systolic pressure is 16.1 mmHg. Left Atrium: Left atrial size was normal in size. Right Atrium: Right atrial size was mildly dilated. Pericardium: There is no evidence of pericardial effusion. Mitral Valve: The mitral valve is normal in structure. No evidence of mitral valve regurgitation. No evidence of mitral valve stenosis. Tricuspid Valve: The tricuspid valve is normal in structure. Tricuspid valve regurgitation is mild . No evidence of tricuspid stenosis. Aortic Valve: The aortic valve is normal in structure. There is mild calcification of the aortic valve. There is mild thickening of the aortic valve. Aortic valve regurgitation is mild. Aortic regurgitation PHT measures 558 msec. Aortic valve sclerosis/calcification is present, without any evidence of aortic stenosis. Pulmonic Valve: The pulmonic  valve was normal in structure. Pulmonic valve regurgitation is trivial. No evidence of pulmonic stenosis. Aorta: Aortic dilatation noted. There is moderate dilatation of the ascending aorta, measuring 46 mm. Venous: The inferior vena cava is normal in size with greater than 50% respiratory variability, suggesting right atrial pressure of 3 mmHg. IAS/Shunts: No atrial level shunt detected by color flow Doppler.  LEFT VENTRICLE PLAX 2D LVIDd:         4.40 cm   Diastology LVIDs:         2.70 cm   LV e' medial:    7.94 cm/s LV PW:         1.10 cm   LV E/e' medial:  9.8 LV IVS:        1.10 cm   LV e' lateral:   11.70 cm/s LVOT diam:     2.00 cm   LV E/e' lateral: 6.6 LV SV:         78 LV SV Index:   37        2D Longitudinal Strain LVOT Area:     3.14 cm  2D Strain GLS Avg:     -23.0 %  RIGHT VENTRICLE RV Basal diam:  4.90 cm RV S prime:     20.40 cm/s TAPSE (M-mode): 2.5 cm LEFT ATRIUM             Index        RIGHT ATRIUM           Index LA diam:        4.30 cm 2.04 cm/m   RA Area:     21.50 cm LA Vol (A2C):   63.8 ml 30.33 ml/m  RA Volume:   70.90 ml  33.71 ml/m LA Vol (A4C):   33.8 ml 16.07 ml/m LA Biplane Vol: 49.2 ml 23.39 ml/m  AORTIC VALVE LVOT Vmax:   154.00 cm/s LVOT Vmean:  107.500 cm/s LVOT VTI:    0.247 m AI PHT:      558 msec  AORTA Ao Root diam: 4.20 cm Ao Asc diam:  4.75 cm MITRAL VALVE                TRICUSPID VALVE MV Area (PHT): 4.15 cm     TR Peak grad:   30.0 mmHg MV Decel Time: 183 msec     TR Vmax:  274.00 cm/s MV E velocity: 77.60 cm/s MV A velocity: 108.00 cm/s  SHUNTS MV E/A ratio:  0.72         Systemic VTI:  0.25 m                             Systemic Diam: 2.00 cm Candee Furbish MD Electronically signed by Candee Furbish MD Signature Date/Time: 08/16/2021/3:37:30 PM    Final      Future Appointments  Date Time Provider [place]  09/08/2021  9:00 AM [facility] [place] None  09/08/2021  9:30 AM Alla Feeling, NP [facility] None  09/08/2021 10:15 AM [facility]  INFUSION [facility] None  09/08/2021 11:15 AM Karie Mainland, RD [facility] None  09/15/2021  9:30 AM Alla Feeling, NP [facility] None  09/29/2021  9:15 AM [facility] [place] FLUSH [facility] None  09/29/2021  9:40 AM Truitt Merle, MD [facility] None  09/29/2021 10:30 AM [facility] INFUSION [facility] None  11/04/2021  3:30 PM Hilty, Nadean Corwin, MD [facility]      LOS: 0 days   Addendum  I have seen the patient, examined him. I agree with the assessment and and plan and have edited the notes.    Addendum  I have seen the patient, examined him. I agree with the assessment and and plan and have edited the notes.   Pt was in our office for Cycle 2 chemo FOLFOX, he received trastuzumab and Keytruda (monoclonal antibody and immunotherapy) a week before. He noticed some skin rash on his chest and abdomen a few days ago, this is probably related to trastuzumab.  His skin rash is mild now, we will continue monitoring.  I explained to him that his massive PE is related to his underlying metastatic cancer, and recent chemotherapy may also contributed to it.  His dyspnea has much improved after tPA and IV heparin.  Given this acute event, I will stop his 5-FU pump infusion earlier today (scheduled to be completed 10am tomorrow).  I called our infusion charge nurse, and she will send a chemo nurse to the floor to stop his chemo pump.  I recommend lifelong anticoagulation, okay to switch to Xarelto or Eliquis before discharge.  I spoke with his wife and daughter at the bedside, all questions were answered.  I appreciate the excellent care from our hospitalist and ICU teams, case discussed with Dr. Carlis Abbott this morning.   Truitt Merle  08/26/2021

## 2021-08-26 NOTE — Progress Notes (Signed)
Patient seen and examined. Low BP at admission, improved, but recurrent overnight.  Elevated BNP, troponin, lactic acid levels.  Presyncopal at home, no actual syncope.  No recent surgeries, no history of CNS malignancy or neuro symptoms recently.  No previous bleeding intracranially.  No issues with GI bleeding.  Patient is DNR, wants to maintain his quality of life.  We discussed the risks, benefits, alternatives of peripheral tPA.  I think he would benefit from this given high risk of decline.  He verbally consented for tPA.  BP 108/63    Pulse 71    Temp 99.1 F (37.3 C) (Rectal)    Resp 19    Ht 6\' 1"  (1.854 m)    Wt 83.6 kg    SpO2 97%    BMI 24.32 kg/m  Elderly man lying in bed no acute distress Port-A-Cath right chest with active infusion. Erythematous macular rash across chest Tachypnea, able to speak in phrases.  Clear to auscultation bilaterally Awake and alert, answering questions appropriately Skin warm, nonmottled  Echo pending CTA personally reviewed Troponin 160> 179> 191 Lactate 3 BNP 681.2 Metabolic acidosis on ABG  Assessment and plan: Massive PE - Recommend peripheral tPA.  Discussed with pharmacy.  Patient verbally consented, multiple RNs present during discussion. --Agree with DNR with metastatic cancer.  Full consult note to follow.  Julian Hy, DO 08/26/21 8:46 AM [facility]

## 2021-08-26 NOTE — Progress Notes (Signed)
ANTICOAGULATION CONSULT NOTE - Follow Up Consult  Pharmacy Consult for heparin Indication:  acute pulmonary embolus  Allergies  Allergen Reactions   Ofloxacin     REACTION: rash    Patient Measurements: Height: 6\' 1"  (185.4 cm) Weight: 83.6 kg (184 lb 4.8 oz) IBW/kg (Calculated) : 79.9 Heparin Dosing Weight: 84 kg  Vital Signs: Temp: 99.1 F (37.3 C) (01/20 0500) Temp Source: Rectal (01/20 0500) BP: 108/63 (01/20 0800) Pulse Rate: 76 (01/20 0845)  Labs: Recent Labs    08/25/21 0844 08/25/21 1910 08/25/21 2130 08/26/21 0308 08/26/21 0518 08/26/21 0648 08/26/21 0806  HGB 10.4* 10.5*  --  9.0*  --  10.8*  --   HCT 31.5* 32.6*  --  28.5*  --  34.2*  --   PLT 358 407*  --  319  --  388  --   LABPROT  --  14.4  --   --   --   --   --   INR  --  1.1  --   --   --   --   --   HEPARINUNFRC  --   --   --   --   --   --  0.28*  CREATININE 0.98 1.06  --  1.17  --   --   --   TROPONINIHS  --  62*   < > 160* 179* 191* 209*   < > = values in this interval not displayed.    Estimated Creatinine Clearance: 56.9 mL/min (by C-G formula based on SCr of 1.17 mg/dL).   Assessment: Billy [name] is a 81 y.o. 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  Chest CTA came back with acute PE with evidence of right heart strain. He was started on heparin on admission.  CCM ordered peripheral tPA 50mg  IV x1 on 1/20.  Today, 08/26/2021: - heparin level 0.28 with rate running at 1350 units/hr prior to tPA adm - tPA given at 0935 - aPTT checked after tPA adm = 62 secs - cbc ok, INR 1.5  Goal of Therapy:  Heparin level 0.3-0.5 units/ml for 24 hrs after tPA, then 0.3-0.7 Monitor platelets by anticoagulation protocol: Yes   Plan:  - With aPTT <80 sec s/p tPA adm, will start heparin drip at 1000 units/hr (no bolus) - check 8  hr heparin level  - monitor for s/sx bleeding  Trisa Cranor P 08/26/2021,9:34 AM

## 2021-08-26 NOTE — Progress Notes (Signed)
Pt doesn't want to be on bipap. Bipap stand by. Pt is in no resp distress and resting well. Bipap remained bedside in case needed.

## 2021-08-26 NOTE — Progress Notes (Signed)
ANTICOAGULATION CONSULT NOTE - Follow Up Consult  Pharmacy Consult for heparin Indication:  acute pulmonary embolus  Allergies  Allergen Reactions   Ofloxacin     REACTION: rash    Patient Measurements: Height: 6\' 1"  (185.4 cm) Weight: 83.6 kg (184 lb 4.8 oz) IBW/kg (Calculated) : 79.9 Heparin Dosing Weight: 84 kg  Vital Signs: Temp: 98.1 F (36.7 C) (01/20 1950) Temp Source: Oral (01/20 1950) BP: 98/62 (01/20 2300) Pulse Rate: 78 (01/20 2300)  Labs: Recent Labs    08/25/21 0844 08/25/21 1910 08/25/21 2130 08/26/21 0308 08/26/21 0518 08/26/21 0648 08/26/21 0806 08/26/21 1135 08/26/21 2100  HGB 10.4* 10.5*  --  9.0*  --  10.8*  --  9.4*  --   HCT 31.5* 32.6*  --  28.5*  --  34.2*  --  29.7*  --   PLT 358 407*  --  319  --  388  --  305  --   APTT  --   --   --   --   --   --   --  62*  --   LABPROT  --  14.4  --   --   --   --   --  17.8*  --   INR  --  1.1  --   --   --   --   --  1.5*  --   HEPARINUNFRC  --   --   --   --   --   --  0.28*  --  0.19*  CREATININE 0.98 1.06  --  1.17  --   --   --   --   --   TROPONINIHS  --  62*   < > 160* 179* 191* 209*  --   --    < > = values in this interval not displayed.     Estimated Creatinine Clearance: 56.9 mL/min (by C-G formula based on SCr of 1.17 mg/dL).   Assessment: Billy [name] is a 81 y.o. 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  Chest CTA came back with acute PE with evidence of right heart strain. He was started on heparin on admission.  CCM ordered peripheral tPA 50mg  IV x1 on 1/20.  Today, 08/26/2021: - heparin level 0.19- subtherapeutic on IV heparin 1000 units/hr - tPA given at 0935 this morning - CBC: Hg slightly low at 9.4, pltc WNL - No bleeding or infusion interruptions noted by RN  Goal of Therapy:  Heparin level 0.3-0.5  units/ml for 24 hrs after tPA, then 0.3-0.7 Monitor platelets by anticoagulation protocol: Yes   Plan:  - Increase IV heparin to previous rate of 1350 units/hr - check 8 hr heparin level  - Daily heparin level & CBC while on heparin - monitor for s/sx bleeding  Billy [name] PharmD 08/26/2021,11:24 PM

## 2021-08-26 NOTE — Progress Notes (Signed)
Echocardiogram 2D Echocardiogram has been performed.  Arlyss Gandy 08/26/2021, 11:03 AM

## 2021-08-26 NOTE — ED Notes (Signed)
ED TO INPATIENT HANDOFF REPORT  ED Nurse Name and Phone #: Erick Colace, RN [phone]  S Name/Age/Gender Billy [name] 81 y.o. male Room/Bed: WA21/WA21  Code Status   Code Status: DNR  Home/SNF/Other Home Patient oriented to: self, place, time, and situation Is this baseline? Yes   Triage Complete: Triage complete  Chief Complaint Pulmonary embolus, right [facility]) [I26.99]  Triage Note Pt here from home with c/o weakness fever and pain in his back , pt just dx with stage 4 stomach ca around thanksgiving of 22 , second  chemo treatment started today    Allergies Allergies  Allergen Reactions   Ofloxacin     REACTION: rash    Level of Care/Admitting Diagnosis ED Disposition     ED Disposition  Admit   Condition  --   [place]: [place] [[id number]]  Level of Care: Telemetry [5]  Admit to tele based on following criteria: Monitor for Ischemic changes  May admit patient to [facility] or [facility] if equivalent level of care is available:: No  Covid Evaluation: Asymptomatic Screening Protocol (No Symptoms)  Diagnosis: Pulmonary embolus, right [facility]) [[id number]]  Admitting Physician: Neena Rhymes [[id number]]  Attending Physician: Adella Hare E [[id number]]  Estimated length of stay: past midnight tomorrow  Certification:: I certify this patient will need inpatient services for at least 2 midnights          B Medical/Surgery History Past Medical History:  Diagnosis Date   Basal cell carcinoma 05/29/2012   left thigh-(EXC)   Basal cell carcinoma 10/23/2012   right side of nose (MOHS)   Basal cell carcinoma 12/12/2012   nod-irght cheek (MOHS)   Basal cell carcinoma 05/26/2015   nod-left calf (CX35FU)   Basal cell carcinoma 05/26/2015   nod-left nose (txpbx)   Basal cell carcinoma 06/08/2016   nod-above right brow (CX35FU)   Basal cell carcinoma 09/12/2016   left nose (MOHS)   Basal cell carcinoma 01/29/2019   nod-right tip of  nose (MOHS)   Benign neoplasm of colon    Finger fracture    Hx of colonoscopy    Hyperlipidemia    Hypertension    Internal hemorrhoids without mention of complication    Myocardial infarction ([place])    09/01/2015   Plantar fascial fibromatosis    Prostatitis    Rib fracture    Squamous cell carcinoma of skin 07/13/2009   RIght lower outer leg   Squamous cell carcinoma of skin 02/17/2014   in situ-right temple (txpbx)   Squamous cell carcinoma of skin 01/24/2018   in situ-right chest (txpbx)   Squamous cell carcinoma of skin 01/24/2018   in situ0 right forehead (txpbx)   Squamous cell carcinoma of skin    Past Surgical History:  Procedure Laterality Date   cardiac stint  2017   COLONOSCOPY  2016   IR IMAGING GUIDED PORT INSERTION  07/12/2021   IR US GUIDE BX ASP/DRAIN  07/12/2021   POLYPECTOMY     TONSILLECTOMY     VARICOCELECTOMY     Left     A IV Location/Drains/Wounds Patient Lines/Drains/Airways Status     Active Line/Drains/Airways     Name Placement date Placement time Site Days   Implanted Port 07/12/21 Right Chest 07/12/21  1255  Chest  45   Peripheral IV 08/25/21 20 G Right Antecubital 08/25/21  1739  Antecubital  1   Peripheral IV 08/25/21 20 G Left;Lateral Wrist 08/25/21  2257  Wrist  1            Intake/Output Last 24 hours  Intake/Output Summary (Last 24 hours) at 08/26/2021 0520 Last data filed at 08/26/2021 7564 Gross per 24 hour  Intake 4100 ml  Output --  Net 4100 ml    Labs/Imaging Results for orders placed or performed during the hospital encounter of 08/25/21 (from the past 48 hour(s))  Lactic acid, plasma     Status: Abnormal   Collection Time: 08/25/21  5:48 PM  Result Value Ref Range   Lactic Acid, Venous 2.2 (HH) 0.5 - 1.9 mmol/L    Comment: CRITICAL RESULT CALLED TO, READ BACK BY AND VERIFIED WITH: WOODY,A. RN @1846  ON 08/25/2021 BY COHEN,K Performed at [facility], [place] [address], [place], [place]  [address]   Comprehensive metabolic panel     Status: Abnormal   Collection Time: 08/25/21  7:10 PM  Result Value Ref Range   Sodium 135 135 - 145 mmol/L   Potassium 4.3 3.5 - 5.1 mmol/L   Chloride 104 98 - 111 mmol/L   CO2 21 (L) 22 - 32 mmol/L   Glucose, Bld 113 (H) 70 - 99 mg/dL    Comment: Glucose reference range applies only to samples taken after fasting for at least 8 hours.   BUN 22 8 - 23 mg/dL   Creatinine, Ser 1.06 0.61 - 1.24 mg/dL   Calcium 8.5 (L) 8.9 - 10.3 mg/dL   Total Protein 6.1 (L) 6.5 - 8.1 g/dL   Albumin 2.7 (L) 3.5 - 5.0 g/dL   AST 116 (H) 15 - 41 U/L   ALT 71 (H) 0 - 44 U/L   Alkaline Phosphatase 511 (H) 38 - 126 U/L   Total Bilirubin 0.6 0.3 - 1.2 mg/dL   GFR, Estimated >60 >60 mL/min    Comment: (NOTE) Calculated using the CKD-EPI Creatinine Equation (2021)    Anion gap 10 5 - 15    Comment: Performed at [facility], [place] [address], [place], [place] [address]  CBC WITH DIFFERENTIAL     Status: Abnormal   Collection Time: 08/25/21  7:10 PM  Result Value Ref Range   WBC 10.5 4.0 - 10.5 K/uL   RBC 3.45 (L) 4.22 - 5.81 MIL/uL   Hemoglobin 10.5 (L) 13.0 - 17.0 g/dL   HCT 32.6 (L) 39.0 - 52.0 %   MCV 94.5 80.0 - 100.0 fL   MCH 30.4 26.0 - 34.0 pg   MCHC 32.2 30.0 - 36.0 g/dL   RDW 13.9 11.5 - 15.5 %   Platelets 407 (H) 150 - 400 K/uL   nRBC 0.0 0.0 - 0.2 %   Neutrophils Relative % 94 %   Neutro Abs 9.9 (H) 1.7 - 7.7 K/uL   Lymphocytes Relative 1 %   Lymphs Abs 0.1 (L) 0.7 - 4.0 K/uL   Monocytes Relative 4 %   Monocytes Absolute 0.5 0.1 - 1.0 K/uL   Eosinophils Relative 0 %   Eosinophils Absolute 0.0 0.0 - 0.5 K/uL   Basophils Relative 0 %   Basophils Absolute 0.0 0.0 - 0.1 K/uL   Immature Granulocytes 1 %   Abs Immature Granulocytes 0.05 0.00 - 0.07 K/uL    Comment: Performed at [facility], [place] [address], [place], [place] [address]  Protime-INR     Status: None   Collection Time: 08/25/21  7:10 PM  Result  Value Ref Range   Prothrombin Time 14.4 11.4 - 15.2 seconds  INR 1.1 0.8 - 1.2    Comment: (NOTE) INR goal varies based on device and disease states. Performed at [facility], [place] [address], [place], [place] [address]   Troponin I (High Sensitivity)     Status: Abnormal   Collection Time: 08/25/21  7:10 PM  Result Value Ref Range   Troponin I (High Sensitivity) 62 (H) <18 ng/L    Comment: (NOTE) Elevated high sensitivity troponin I (hsTnI) values and significant  changes across serial measurements may suggest ACS but many other  chronic and acute conditions are known to elevate hsTnI results.  Refer to the "Links" section for chest pain algorithms and additional  guidance. Performed at [facility], [place] [address] [place], [place] [address]   Lactic acid, plasma     Status: None   Collection Time: 08/25/21  9:30 PM  Result Value Ref Range   Lactic Acid, Venous 1.7 0.5 - 1.9 mmol/L    Comment: Performed at [facility], [place] [address], [place], [place] [address]  Troponin I (High Sensitivity)     Status: Abnormal   Collection Time: 08/25/21  9:30 PM  Result Value Ref Range   Troponin I (High Sensitivity) 114 (HH) <18 ng/L    Comment: CRITICAL RESULT CALLED TO, READ BACK BY AND VERIFIED WITH:  NATASHA [facility] RN 08/25/21 @ VS (NOTE) Elevated high sensitivity troponin I (hsTnI) values and significant  changes across serial measurements may suggest ACS but many other  chronic and acute conditions are known to elevate hsTnI results.  Refer to the Links section for chest pain algorithms and additional  guidance. Performed at [facility], [place] [address] [place], [place] [address]   Urinalysis, Routine w reflex microscopic Urine, Clean Catch     Status: Abnormal   Collection Time: 08/25/21 11:01 PM  Result Value Ref Range   Color, Urine YELLOW YELLOW   APPearance HAZY (A) CLEAR   Specific Gravity,  Urine 1.041 (H) 1.005 - 1.030   pH 5.0 5.0 - 8.0   Glucose, UA NEGATIVE NEGATIVE mg/dL   Hgb urine dipstick NEGATIVE NEGATIVE   Bilirubin Urine NEGATIVE NEGATIVE   Ketones, ur 5 (A) NEGATIVE mg/dL   Protein, ur 30 (A) NEGATIVE mg/dL   Nitrite NEGATIVE NEGATIVE   Leukocytes,Ua NEGATIVE NEGATIVE   RBC / HPF 6-10 0 - 5 RBC/hpf   WBC, UA 6-10 0 - 5 WBC/hpf   Bacteria, UA NONE SEEN NONE SEEN   Squamous Epithelial / LPF 0-5 0 - 5   Mucus PRESENT     Comment: Performed at [facility], [place] [address] [place], [place] [address]  Basic metabolic panel     Status: Abnormal   Collection Time: 08/26/21  3:08 AM  Result Value Ref Range   Sodium 133 (L) 135 - 145 mmol/L   Potassium 4.7 3.5 - 5.1 mmol/L   Chloride 106 98 - 111 mmol/L   CO2 20 (L) 22 - 32 mmol/L   Glucose, Bld 125 (H) 70 - 99 mg/dL    Comment: Glucose reference range applies only to samples taken after fasting for at least 8 hours.   BUN 24 (H) 8 - 23 mg/dL   Creatinine, Ser 1.17 0.61 - 1.24 mg/dL   Calcium 7.4 (L) 8.9 - 10.3 mg/dL   GFR, Estimated >60 >60 mL/min    Comment: (NOTE) Calculated using the CKD-EPI Creatinine Equation (2021)    Anion gap 7 5 - 15  Comment: Performed at [facility], [place] [address], [place], [place] [address]  CBC     Status: Abnormal   Collection Time: 08/26/21  3:08 AM  Result Value Ref Range   WBC 16.0 (H) 4.0 - 10.5 K/uL   RBC 2.97 (L) 4.22 - 5.81 MIL/uL   Hemoglobin 9.0 (L) 13.0 - 17.0 g/dL   HCT 28.5 (L) 39.0 - 52.0 %   MCV 96.0 80.0 - 100.0 fL   MCH 30.3 26.0 - 34.0 pg   MCHC 31.6 30.0 - 36.0 g/dL   RDW 14.0 11.5 - 15.5 %   Platelets 319 150 - 400 K/uL   nRBC 0.0 0.0 - 0.2 %    Comment: Performed at [facility], [place] [address], [place], [place] [address]  Resp Panel by RT-PCR (Flu A&B, Covid) Nasopharyngeal Swab     Status: None   Collection Time: 08/26/21  3:08 AM   Specimen: Nasopharyngeal Swab; Nasopharyngeal(NP) swabs  in vial transport medium  Result Value Ref Range   SARS Coronavirus 2 by RT PCR NEGATIVE NEGATIVE    Comment: (NOTE) SARS-CoV-2 target nucleic acids are NOT DETECTED.  The SARS-CoV-2 RNA is generally detectable in upper respiratory specimens during the acute phase of infection. The lowest concentration of SARS-CoV-2 viral copies this assay can detect is 138 copies/mL. A negative result does not preclude SARS-Cov-2 infection and should not be used as the sole basis for treatment or other patient management decisions. A negative result may occur with  improper specimen collection/handling, submission of specimen other than nasopharyngeal swab, presence of viral mutation(s) within the areas targeted by this assay, and inadequate number of viral copies(<138 copies/mL). A negative result must be combined with clinical observations, patient history, and epidemiological information. The expected result is Negative.  Fact Sheet for Patients:  EntrepreneurPulse.com.au  Fact Sheet for Healthcare Providers:  IncredibleEmployment.be  This test is no t yet approved or cleared by the [place] FDA and  has been authorized for detection and/or diagnosis of SARS-CoV-2 by FDA under an Emergency Use Authorization (EUA). This EUA will remain  in effect (meaning this test can be used) for the duration of the COVID-19 declaration under Section 564(b)(1) of the Act, 21 U.S.C.section 360bbb-3(b)(1), unless the authorization is terminated  or revoked sooner.       Influenza A by PCR NEGATIVE NEGATIVE   Influenza B by PCR NEGATIVE NEGATIVE    Comment: (NOTE) The Xpert Xpress SARS-CoV-2/FLU/RSV plus assay is intended as an aid in the diagnosis of influenza from Nasopharyngeal swab specimens and should not be used as a sole basis for treatment. Nasal washings and aspirates are unacceptable for Xpert Xpress SARS-CoV-2/FLU/RSV testing.  Fact Sheet for  Patients: EntrepreneurPulse.com.au  Fact Sheet for Healthcare Providers: IncredibleEmployment.be  This test is not yet approved or cleared by the [place] FDA and has been authorized for detection and/or diagnosis of SARS-CoV-2 by FDA under an Emergency Use Authorization (EUA). This EUA will remain in effect (meaning this test can be used) for the duration of the COVID-19 declaration under Section 564(b)(1) of the Act, 21 U.S.C. section 360bbb-3(b)(1), unless the authorization is terminated or revoked.  Performed at [facility], [place] [address], [place], [place] [address]   Troponin I (High Sensitivity)     Status: Abnormal   Collection Time: 08/26/21  3:08 AM  Result Value Ref Range   Troponin I (High Sensitivity) 160 (HH) <18 ng/L    Comment: CRITICAL VALUE NOTED.  VALUE IS CONSISTENT WITH PREVIOUSLY REPORTED AND CALLED VALUE. (NOTE) Elevated high sensitivity troponin I (hsTnI) values and significant  changes across serial measurements may suggest ACS but many other  chronic and acute conditions are known to elevate hsTnI results.  Refer to the Links section for chest pain algorithms and additional  guidance. Performed at [facility], [place] [address] [place], [place] [address]    CT Angio Chest Pulmonary Embolism (PE) W or WO Contrast  Result Date: 08/25/2021 CLINICAL DATA:  Pulmonary embolism (PE) suspected, high prob short of breath, ca patient, eval for PE EXAM: CT ANGIOGRAPHY CHEST WITH CONTRAST TECHNIQUE: Multidetector CT imaging of the chest was performed using the standard protocol during bolus administration of intravenous contrast. Multiplanar CT image reconstructions and MIPs were obtained to evaluate the vascular anatomy. RADIATION DOSE REDUCTION: This exam was performed according to the departmental dose-optimization program which includes automated exposure control, adjustment of the mA  and/or kV according to patient size and/or use of iterative reconstruction technique. CONTRAST:  62mL OMNIPAQUE IOHEXOL 350 MG/ML SOLN COMPARISON:  CT angiography chest 07/01/2021 FINDINGS: Cardiovascular: Fair opacification of the pulmonary arteries to the segmental level. Right middle lobe segmental subsegmental pulmonary embolus. Chronic enlarged right to left ventricular ratio 1.4. No significant pericardial effusion. The ascending thoracic aorta is enlarged in caliber measuring up to 4.6 cm. The descending thoracic aorta is normal in caliber. At least mild atherosclerotic plaque of the thoracic aorta. Left anterior descending coronary artery calcifications. Mediastinum/Nodes: Stable internal mammary 0.5 cm lymph node on the left (5:86). Stable other inferior measuring 0.3 cm (5:121). Asymmetrically prominent left axillary lymph nodes compared to the right. No enlarged mediastinal, hilar, or axillary lymph nodes. Thyroid gland, trachea, and esophagus demonstrate no significant findings. Lungs/Pleura: Trace biapical paraseptal emphysematous changes. Biapical pleural/pulmonary scarring. No focal consolidation. Stable scattered pulmonary micronodule within the right lung. Stable 6 mm left lower lobe pulmonary nodule (6:118). Stable 6 mm right lower lobe pulmonary nodule (6:78). No new pulmonary nodule. Pulmonary mass. No pleural effusion. No pneumothorax. Upper Abdomen: Redemonstration of innumerable ill-defined hypodensities within the hepatic parenchyma consistent with metastases. Musculoskeletal: No abdominal wall hernia or abnormality. No suspicious lytic or blastic osseous lesions. No acute displaced fracture. Multilevel degenerative changes of the spine. Review of the MIP images confirms the above findings. IMPRESSION: 1. Right middle lobe segmental and subsegmental pulmonary embolus. Enlarged right to left ventricular ratio 1.4 suggestive of right heart strain. No pulmonary infarction. 2. Stable ascending  thoracic aorta aneurysm (4.6 cm). Ascending thoracic aortic aneurysm. Recommend semi-annual imaging followup by CTA or MRA and referral to cardiothoracic surgery if not already obtained. This recommendation follows 2010 ACCF/AHA/AATS/ACR/ASA/SCA/SCAI/SIR/STS/SVM Guidelines for the Diagnosis and Management of Patients With Thoracic Aortic Disease. Circulation. 2010; 121: H419-F790. Aortic aneurysm NOS (ICD10-I71.9) 3. Hepatic metastases poorly visualized. 4. Asymmetrically prominent left axillary lymph nodes compared to the right. 5. Couple of stable subcentimeter left internal mammary lymph nodes. 6. Stable 6 mm bilateral lower lobe pulmonary nodules. Stable scattered pulmonary micronodules within the right lung. 7. Aortic Atherosclerosis (ICD10-I70.0) and Emphysema (ICD10-J43.9). These results were called by telephone at the time of interpretation on 08/25/2021 at 10:11 pm to provider [facility] , who verbally acknowledged these results. Electronically Signed   By: Iven Finn M.D.   On: 08/25/2021 22:21   CT CHEST ABDOMEN PELVIS W CONTRAST  Addendum Date: 08/25/2021   ADDENDUM REPORT: 08/25/2021 11:30 ADDENDUM: Upper normal 10 mm short axis left thoracic inlet node on 04/02. 11  mm short axis left supraclavicular node on image 3/2 is upper normal to mildly enlarged. Electronically Signed   By: Misty Stanley M.D.   On: 08/25/2021 11:30   Result Date: 08/25/2021 CLINICAL DATA:  Gastric cancer.  Staging. EXAM: CT CHEST, ABDOMEN, AND PELVIS WITH CONTRAST TECHNIQUE: Multidetector CT imaging of the chest, abdomen and pelvis was performed following the standard protocol during bolus administration of intravenous contrast. RADIATION DOSE REDUCTION: This exam was performed according to the departmental dose-optimization program which includes automated exposure control, adjustment of the mA and/or kV according to patient size and/or use of iterative reconstruction technique. CONTRAST:  123mL OMNIPAQUE IOHEXOL  300 MG/ML  SOLN COMPARISON:  07/01/2021. FINDINGS: CT CHEST FINDINGS Cardiovascular: The heart size is normal. No substantial pericardial effusion. Coronary artery calcification is evident. Mild atherosclerotic calcification is noted in the wall of the thoracic aorta. Ascending thoracic aorta measures 4.5 cm diameter. Right Port-A-Cath tip is positioned in the distal SVC. Mediastinum/Nodes: 16 mm short axis precarinal node has increased from 6 mm (remeasured) previously. 10 mm short axis right hilar lymph node is mildly progressive in the interval. The esophagus has normal imaging features. There is no axillary lymphadenopathy. Lungs/Pleura: Biapical pleuroparenchymal scarring noted. Numerous bilateral pulmonary nodules identified, similar to prior. Index right lower pulmonary nodule measures 6 mm on image 72/5. Index nodule posterior left lower lobe is 6 mm on 83/5. Index right lower lobe nodule measuring 9 mm identified on image 90/5. Index nodule left lower lobe on 01/12/5 measures 5 mm. No focal consolidation.  Trace left pleural effusion evident. Musculoskeletal: No worrisome lytic or sclerotic osseous abnormality. CT ABDOMEN PELVIS FINDINGS Hepatobiliary: Innumerable hepatic metastases are again noted. One of the more dominant lesions is identified in the lateral segment left liver on image 67/2 measuring 4.8 x 2.7 cm today compared to 4.3 x 2.5 cm when remeasured in a similar fashion on the prior study. Another dominant lesion identified in segment IV measures 5.0 x 4.1 cm on 59/2, clearly increased from 2.7 x 1.8 cm when remeasured on the prior study. 3.0 cm posterior right hepatic lobe lesion on 57/2 was 2.2 cm previously (remeasured). 2.5 cm lesion in the medial right liver adjacent to the IVC has increased from 1.1 cm previously. 6.5 x 4.0 cm lesion inferior tip right liver (82/2 was measured on the previous study at 4.8 x 3.1 cm. There is no evidence for gallstones, gallbladder wall thickening, or  pericholecystic fluid. No intrahepatic or extrahepatic biliary dilation. Pancreas: No focal mass lesion. No dilatation of the main duct. No intraparenchymal cyst. No peripancreatic edema. Spleen: Small infarct noted inferior spleen. (Axial 62/2 and coronal 100/6). Adrenals/Urinary Tract: No adrenal nodule or mass. Kidneys unremarkable. No evidence for hydroureter. The urinary bladder appears normal for the degree of distention. Stomach/Bowel: Stomach is decompressed. Duodenum is normally positioned as is the ligament of Treitz. No small bowel wall thickening. No small bowel dilatation. The terminal ileum is normal. The appendix is not well visualized, but there is no edema or inflammation in the region of the cecum. No gross colonic mass. No colonic wall thickening. Vascular/Lymphatic: There is mild atherosclerotic calcification of the abdominal aorta without aneurysm. Necrotic lymphadenopathy again identified in the hepatoduodenal ligament. 3.2 x 2.1 cm node visible on 63/2 was measured previously at 2.8 x 2.3 cm. 2.0 cm short axis aortocaval node seen on 72/2. 13 mm short axis right retrocrural node evident. No pelvic sidewall lymphadenopathy. Reproductive: The prostate gland and seminal vesicles are unremarkable. Other:  No intraperitoneal free fluid. Musculoskeletal: Small right groin hernia contains small bowel without complicating features. There is a left groin hernia containing a short segment of the sigmoid colon without complicating features. No worrisome lytic or sclerotic osseous abnormality. IMPRESSION: 1. Mild interval progression of mediastinal and right hilar lymphadenopathy. 2. Similar appearance of numerous bilateral pulmonary nodules compatible with metastatic disease. 3. Interval progression of innumerable hepatic metastases. 4. Similar to mildly progressive necrotic lymphadenopathy in the hepatoduodenal ligament and aortocaval space. 5. Small inferior splenic infarct, new since prior. 6.  Bilateral groin hernias containing small bowel and colon, respectively, without complicating features. 7. 4.5 cm ascending thoracic aortic aneurysm. Recommend semi-annual imaging followup by CTA or MRA and referral to cardiothoracic surgery if not already obtained. 2010; 121: [id number]. 8. Aortic Atherosclerosis (ICD10-I70.0). Electronically Signed: By: Misty Stanley M.D. On: 08/25/2021 10:01   DG Chest Port 1 View  Result Date: 08/25/2021 CLINICAL DATA:  Weakness, fever, stage IV gastric cancer EXAM: PORTABLE CHEST 1 VIEW COMPARISON:  08/24/2021, 07/01/2021 FINDINGS: Two frontal views of the chest demonstrate right chest wall port tip overlying superior vena cava. The cardiac silhouette is unremarkable. No acute airspace disease, effusion, or pneumothorax. The pulmonary nodule seen on preceding chest CT are not well visualized by x-ray. No acute bony abnormalities. IMPRESSION: 1. No acute airspace disease. 2. Please refer to CT chest performed 08/24/2021 describing intrathoracic metastatic disease not apparent by x-ray. Electronically Signed   By: Randa Ngo M.D.   On: 08/25/2021 18:13    Pending Labs Unresulted Labs (From admission, onward)     Start     Ordered   08/26/21 0700  Heparin level (unfractionated)  Once-Timed,   STAT        08/25/21 2310   08/26/21 0700  CBC  Daily,   R      08/25/21 2310   08/25/21 1748  Blood Culture (routine x 2)  (Undifferentiated presentation (screening labs and basic nursing orders))  BLOOD CULTURE X 2,   STAT      08/25/21 1748            Vitals/Pain Today's Vitals   08/26/21 0335 08/26/21 0400 08/26/21 0430 08/26/21 0500  BP: 94/62 (!) 92/59 94/67 (!) 101/57  Pulse: 72 97 70 74  Resp: (!) 24 20 20 20   Temp:    99.1 F (37.3 C)  TempSrc:    Rectal  SpO2: 97% 97% 100% 99%  Weight:      Height:      PainSc:        Isolation Precautions No active isolations  Medications Medications  0.9 %  sodium chloride infusion ( Intravenous  Rate/Dose Verify 08/26/21 0240)  fentaNYL (SUBLIMAZE) injection 50 mcg (0 mcg Intravenous Hold 08/25/21 1909)  heparin ADULT infusion 100 units/mL (25000 units/245mL) (1,350 Units/hr Intravenous New Bag/Given 08/25/21 2300)  morphine (MS CONTIN) 12 hr tablet 15 mg (has no administration in time range)  oxyCODONE (Oxy IR/ROXICODONE) immediate release tablet 5 mg (5 mg Oral Given 08/26/21 0034)  atorvastatin (LIPITOR) tablet 80 mg (has no administration in time range)  lisinopril (ZESTRIL) tablet 2.5 mg (has no administration in time range)  ALPRAZolam (XANAX) tablet 0.5 mg (has no administration in time range)  mirtazapine (REMERON) tablet 7.5 mg (7.5 mg Oral Given 08/26/21 0034)  ondansetron (ZOFRAN) tablet 8 mg (has no administration in time range)  lidocaine-prilocaine (EMLA) cream 1 application (has no administration in time range)  ceFEPIme (MAXIPIME) 2 g in sodium chloride 0.9 %  100 mL IVPB (0 g Intravenous Stopped 08/26/21 0513)  vancomycin (VANCOREADY) IVPB 1500 mg/300 mL (has no administration in time range)  0.45 % sodium chloride infusion ( Intravenous New Bag/Given 08/26/21 0220)  acetaminophen (TYLENOL) tablet 650 mg (650 mg Oral Given 08/26/21 0053)    Or  acetaminophen (TYLENOL) suppository 650 mg ( Rectal See Alternative 08/26/21 0053)  sodium chloride 0.9 % bolus 1,000 mL (0 mLs Intravenous Stopped 08/25/21 1940)  ceFEPIme (MAXIPIME) 2 g in sodium chloride 0.9 % 100 mL IVPB (0 g Intravenous Stopped 08/25/21 2041)  metroNIDAZOLE (FLAGYL) IVPB 500 mg (0 mg Intravenous Stopped 08/25/21 2122)  vancomycin (VANCOREADY) IVPB 1500 mg/300 mL (0 mg Intravenous Stopped 08/26/21 0041)  sodium chloride 0.9 % bolus 1,000 mL (0 mLs Intravenous Stopped 08/25/21 1940)  sodium chloride 0.9 % bolus 1,000 mL (0 mLs Intravenous Stopped 08/25/21 2122)  morphine (MS CONTIN) 12 hr tablet 15 mg (15 mg Oral Given 08/25/21 2116)  iohexol (OMNIPAQUE) 350 MG/ML injection 80 mL (80 mLs Intravenous Contrast Given 08/25/21  2139)  heparin bolus via infusion 2,500 Units (2,500 Units Intravenous Bolus from Bag 08/25/21 2256)  sodium chloride 0.9 % bolus 500 mL (0 mLs Intravenous Stopped 08/26/21 0306)    Mobility walks with person assist Low fall risk   Focused Assessments    R Recommendations: See Admitting Provider Note  Report given to:   Additional Notes:

## 2021-08-26 NOTE — Progress Notes (Addendum)
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 pristine 60s sports car and despite meticulous care care given by the owner, 1 day the sports car will reach the end of its longevity despite how much care we give it  : My impression is that he had fluid overload superimposed on right-sided PE and that with Lasix he is better I have asked critical care to look in on him to discuss risk benefits and alternatives to lysis although he may not require this if he continues to oxygenate well as he is on IV heparin Cardiology to see this morning in addition but feel that troponin elevation would be secondary to right heart strain from PE--echo has been ordered STAT  Palliative care has been discussed with me family in detail and  it is hopeful that they will see him semiacutely with regards to some of the goals of care discussions   I was subsequently informed patient decompensated and is on Pressors--Appreciate Dr. Carlis Abbott taking over care--we will cede care to PCCM --thanks  > 30 minutes critical care time  Verneita Griffes, MD [facility] 8:26 AM

## 2021-08-26 NOTE — Progress Notes (Signed)
Pharmacy Antibiotic Note  Billy [name] is a 81 y.o. 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  Chest CTA came back with acute PE with evidence of right heart strain.  He was started on vancomycin and cefepime on admission for fever.  Today, 08/26/2021: - day #1 abx - Tmax 103.1 - WBC 19.6, ANC 9.9 - scr up 1.17 (crcl~57)  Plan: - continue cefepime 2gm q12h - continue vancomycin 1500 mg IV q24h for est AUC 480 ____________________________________________  Height: 6\' 1"  (185.4 cm) Weight: 83.6 kg (184 lb 4.8 oz) IBW/kg (Calculated) : 79.9  Temp (24hrs), Avg:99.7 F (37.6 C), Min:98.1 F (36.7 C), Max:103.1 F (39.5 C)  Recent Labs  Lab 08/25/21 0844 08/25/21 1748 08/25/21 1910 08/25/21 2130 08/26/21 0308 08/26/21 0648  WBC 12.2*  --  10.5  --  16.0* 19.6*  CREATININE 0.98  --  1.06  --  1.17  --   LATICACIDVEN  --  2.2*  --  1.7  --  3.0*    Estimated Creatinine Clearance: 56.9 mL/min (by C-G formula based on SCr of 1.17 mg/dL).    Allergies  Allergen Reactions   Ofloxacin     REACTION: rash     Thank you for allowing pharmacy to be a part of this patients care.  Lynelle Doctor 08/26/2021 9:28 AM

## 2021-08-26 NOTE — H&P (Signed)
History and Physical    Billy [name] JKK:[id number] DOB: [birth year] DOA: 08/25/2021  PCP: Biagio Borg, MD (Confirm with patient/family/NH records and if not entered, this has to be entered at [facility] point of entry) Patient coming from: home  I have personally briefly reviewed patient's old medical records in [facility]  Chief Complaint: fever, SOB, weakness  HPI: Billy [name] is a 81 y.o. male with medical history significant of CAD with MI and stenting of Left main. Recently diagnosed with gastric adenocarcinoma with mets. He has started chemo having had first round weeks ago with mild reactions. HE was started on a 48 hour infusion today. After returning home he developed a fever to 102, felt hot, weak and short of breath. He presents to [facility] for evaluation. He denies having any chest pain, cough, rigors.   ED Course: Tmasx 103 R, 84/69  HR 92  RR 25. Patient appeared flushed and was warm to touch. Lab reveals AST 116. ALT 71  Troponin 56 to 114. Lactic acid 1.7. WBC 10.5 with 94/1/4. CXR NAD. CTA chest with RML segmental and subsegmental PE. Due to fever and immunosuppression Abx started: Cefipime, Vanc, Flagyl. Blood cultures, including fungal cultures ordered. IV heparin intiated. TRH called to admit patient for continued treatment.   Review of Systems: As per HPI otherwise 10 point review of systems negative.    Past Medical History:  Diagnosis Date   Basal cell carcinoma 05/29/2012   left thigh-(EXC)   Basal cell carcinoma 10/23/2012   right side of nose (MOHS)   Basal cell carcinoma 12/12/2012   nod-irght cheek (MOHS)   Basal cell carcinoma 05/26/2015   nod-left calf (CX35FU)   Basal cell carcinoma 05/26/2015   nod-left nose (txpbx)   Basal cell carcinoma 06/08/2016   nod-above right brow (CX35FU)   Basal cell carcinoma 09/12/2016   left nose (MOHS)   Basal cell carcinoma 01/29/2019   nod-right tip of nose (MOHS)   Benign neoplasm of colon    Finger fracture     Hx of colonoscopy    Hyperlipidemia    Hypertension    Internal hemorrhoids without mention of complication    Myocardial infarction ([place])    09/01/2015   Plantar fascial fibromatosis    Prostatitis    Rib fracture    Squamous cell carcinoma of skin 07/13/2009   RIght lower outer leg   Squamous cell carcinoma of skin 02/17/2014   in situ-right temple (txpbx)   Squamous cell carcinoma of skin 01/24/2018   in situ-right chest (txpbx)   Squamous cell carcinoma of skin 01/24/2018   in situ0 right forehead (txpbx)   Squamous cell carcinoma of skin     Past Surgical History:  Procedure Laterality Date   cardiac stint  2017   COLONOSCOPY  2016   IR IMAGING GUIDED PORT INSERTION  07/12/2021   IR US GUIDE BX ASP/DRAIN  07/12/2021   POLYPECTOMY     TONSILLECTOMY     VARICOCELECTOMY     Left    Soc hx - married 70 years. Retired. Lives with his wife.   reports that he quit smoking about 43 years ago. His smoking use included cigarettes. He has a 20.00 pack-year smoking history. He has never used smokeless tobacco. He reports current alcohol use of about 25.0 standard drinks per week. He reports that he does not use drugs.  Allergies  Allergen Reactions   Ofloxacin     REACTION: rash    Family  History  Problem Relation Age of Onset   Heart disease Mother        ? CAD. died 34   Stroke Father        at age 42, smoked cigar   Diabetes Neg Hx    Prostate cancer Neg Hx    Colon cancer Neg Hx    Stomach cancer Neg Hx    Rectal cancer Neg Hx    Colon polyps Neg Hx    Esophageal cancer Neg Hx      Prior to Admission medications   Medication Sig Start Date End Date Taking? Authorizing Provider  ALPRAZolam Duanne Moron) 0.5 MG tablet Take 1 tablet (0.5 mg total) by mouth 2 (two) times daily as needed for anxiety. 07/07/21  Yes Biagio Borg, MD  aspirin EC 81 MG tablet Take 81 mg by mouth every evening.   Yes [provider]  atorvastatin (LIPITOR) 80 MG tablet Take 1 tablet  (80 mg total) by mouth daily at 6 PM. 01/27/21  Yes Hilty, Nadean Corwin, MD  Cholecalciferol (VITAMIN D3 PO) Take 1 tablet by mouth daily.   Yes [provider]  lidocaine-prilocaine (EMLA) cream Apply 1 application topically as needed. Apply 0.33gm to PortaCath site 30 to 60 mins prior to PortaCath access. 07/27/21  Yes Truitt Merle, MD  lisinopril (ZESTRIL) 2.5 MG tablet Take 1 tablet (2.5 mg total) by mouth daily. 12/01/20  Yes Biagio Borg, MD  mirtazapine (REMERON) 7.5 MG tablet Take 1 tablet (7.5 mg total) by mouth at bedtime. 08/25/21  Yes Alla Feeling, NP  morphine (MS CONTIN) 15 MG 12 hr tablet Take 1 tablet (15 mg total) by mouth every 12 (twelve) hours. 08/25/21  Yes Alla Feeling, NP  nitroGLYCERIN (NITROSTAT) 0.4 MG SL tablet Place 1 tablet (0.4 mg total) under the tongue every 5 (five) minutes as needed for chest pain. Max 3 doses. 03/13/18  Yes Hilty, Nadean Corwin, MD  Omega-3 Fatty Acids (FISH OIL) 500 MG CAPS Take 500 mg by mouth daily.   Yes [provider]  oxyCODONE (OXY IR/ROXICODONE) 5 MG immediate release tablet Take 1 tablet (5 mg total) by mouth every 4 (four) hours as needed for severe pain. 08/12/21  Yes Truitt Merle, MD  capecitabine (XELODA) 500 MG tablet Take 3 tablets every 12 hours, for 14 days then off for 7 days. Take after meal. 08/21/21   Truitt Merle, MD  HYDROcodone-acetaminophen (NORCO) 5-325 MG tablet Take 1 tablet by mouth every 4 (four) hours as needed for moderate pain. 08/05/21   Truitt Merle, MD  ondansetron (ZOFRAN) 8 MG tablet Take 1 tablet (8 mg total) by mouth every 8 (eight) hours as needed for nausea or vomiting. 07/27/21   Truitt Merle, MD  prochlorperazine (COMPAZINE) 10 MG tablet Take 1 tablet (10 mg total) by mouth every 6 (six) hours as needed for nausea or vomiting. 07/27/21   Truitt Merle, MD  traMADol (ULTRAM) 50 MG tablet Take 1 tablet (50 mg total) by mouth every 6 (six) hours as needed. Patient not taking: Reported on 08/25/2021 07/21/21   Cordelia Poche    Physical Exam: Vitals:   08/25/21 2300 08/25/21 2330 08/26/21 0000 08/26/21 0030  BP: 97/62 (!) 84/59 101/61 130/74  Pulse: 100 92 98 100  Resp: 18 (!) 25 (!) 27 (!) 25  Temp: 98.4 F (36.9 C)   (!) 103.1 F (39.5 C)  TempSrc: Oral   Rectal  SpO2: 95% 98% 98% 99%  Weight:      Height:        Vitals:   08/25/21 2300 08/25/21 2330 08/26/21 0000 08/26/21 0030  BP: 97/62 (!) 84/59 101/61 130/74  Pulse: 100 92 98 100  Resp: 18 (!) 25 (!) 27 (!) 25  Temp: 98.4 F (36.9 C)   (!) 103.1 F (39.5 C)  TempSrc: Oral   Rectal  SpO2: 95% 98% 98% 99%  Weight:      Height:       General:- flushed male in no acute distress. Eyes: PERRL, lids and conjunctivae normal ENMT: Mucous membranes are dry. Posterior pharynx clear of any exudate or lesions.Normal dentition.  Neck: normal, supple, no masses, no thyromegaly Respiratory: clear to auscultation bilaterally, no wheezing, no crackles. Normal respiratory effort. No accessory muscle use.  Chest portacath left upper chest Cardiovascular: Regular rate and rhythm, no murmurs / rubs / gallops. No extremity edema. 2+ pedal pulses. No carotid bruits.  Abdomen: diffuse tenderness worst RUQ, no masses palpated. Question of liver edge 2 cm below costal margin. Bowel sounds positive.  Musculoskeletal: no clubbing / cyanosis. No joint deformity upper and lower extremities. Good ROM, no contractures. Normal muscle tone. Negative Hoffman's sign. Skin: no rashes, lesions, ulcers. No induration Neurologic: CN 2-12 grossly intact.Strength 5/5 in all 4.  Psychiatric: Normal judgment and insight. Alert and oriented x 3. Normal mood.     Labs on Admission: I have personally reviewed following labs and imaging studies  CBC: Recent Labs  Lab 08/25/21 0844 08/25/21 1910  WBC 12.2* 10.5  NEUTROABS 8.4* 9.9*  HGB 10.4* 10.5*  HCT 31.5* 32.6*  MCV 92.9 94.5  PLT 358 938*   Basic Metabolic Panel: Recent Labs  Lab 08/25/21 0844  08/25/21 1910  NA 136 135  K 4.2 4.3  CL 103 104  CO2 25 21*  GLUCOSE 111* 113*  BUN 18 22  CREATININE 0.98 1.06  CALCIUM 8.8* 8.5*   GFR: Estimated Creatinine Clearance: 62.8 mL/min (by C-G formula based on SCr of 1.06 mg/dL). Liver Function Tests: Recent Labs  Lab 08/25/21 0844 08/25/21 1910  AST 88* 116*  ALT 59* 71*  ALKPHOS 415* 511*  BILITOT 0.7 0.6  PROT 6.3* 6.1*  ALBUMIN 3.2* 2.7*   No results for input(s): LIPASE, AMYLASE in the last 168 hours. No results for input(s): AMMONIA in the last 168 hours. Coagulation Profile: Recent Labs  Lab 08/25/21 1910  INR 1.1   Cardiac Enzymes: No results for input(s): CKTOTAL, CKMB, CKMBINDEX, TROPONINI in the last 168 hours. BNP (last 3 results) No results for input(s): PROBNP in the last 8760 hours. HbA1C: No results for input(s): HGBA1C in the last 72 hours. CBG: No results for input(s): GLUCAP in the last 168 hours. Lipid Profile: No results for input(s): CHOL, HDL, LDLCALC, TRIG, CHOLHDL, LDLDIRECT in the last 72 hours. Thyroid Function Tests: No results for input(s): TSH, T4TOTAL, FREET4, T3FREE, THYROIDAB in the last 72 hours. Anemia Panel: No results for input(s): VITAMINB12, FOLATE, FERRITIN, TIBC, IRON, RETICCTPCT in the last 72 hours. Urine analysis:    Component Value Date/Time   COLORURINE YELLOW 08/25/2021 2301   APPEARANCEUR HAZY (A) 08/25/2021 2301   LABSPEC 1.041 (H) 08/25/2021 2301   PHURINE 5.0 08/25/2021 2301   GLUCOSEU NEGATIVE 08/25/2021 2301   GLUCOSEU NEGATIVE 07/28/2020 1051   HGBUR NEGATIVE 08/25/2021 2301   BILIRUBINUR NEGATIVE 08/25/2021 2301   BILIRUBINUR n 07/27/2015 1503   KETONESUR 5 (A) 08/25/2021 2301   PROTEINUR 30 (A) 08/25/2021 2301  UROBILINOGEN 0.2 07/28/2020 1051   NITRITE NEGATIVE 08/25/2021 2301   LEUKOCYTESUR NEGATIVE 08/25/2021 2301    Radiological Exams on Admission: CT Angio Chest Pulmonary Embolism (PE) W or WO Contrast  Result Date: 08/25/2021 CLINICAL  DATA:  Pulmonary embolism (PE) suspected, high prob short of breath, ca patient, eval for PE EXAM: CT ANGIOGRAPHY CHEST WITH CONTRAST TECHNIQUE: Multidetector CT imaging of the chest was performed using the standard protocol during bolus administration of intravenous contrast. Multiplanar CT image reconstructions and MIPs were obtained to evaluate the vascular anatomy. RADIATION DOSE REDUCTION: This exam was performed according to the departmental dose-optimization program which includes automated exposure control, adjustment of the mA and/or kV according to patient size and/or use of iterative reconstruction technique. CONTRAST:  35mL OMNIPAQUE IOHEXOL 350 MG/ML SOLN COMPARISON:  CT angiography chest 07/01/2021 FINDINGS: Cardiovascular: Fair opacification of the pulmonary arteries to the segmental level. Right middle lobe segmental subsegmental pulmonary embolus. Chronic enlarged right to left ventricular ratio 1.4. No significant pericardial effusion. The ascending thoracic aorta is enlarged in caliber measuring up to 4.6 cm. The descending thoracic aorta is normal in caliber. At least mild atherosclerotic plaque of the thoracic aorta. Left anterior descending coronary artery calcifications. Mediastinum/Nodes: Stable internal mammary 0.5 cm lymph node on the left (5:86). Stable other inferior measuring 0.3 cm (5:121). Asymmetrically prominent left axillary lymph nodes compared to the right. No enlarged mediastinal, hilar, or axillary lymph nodes. Thyroid gland, trachea, and esophagus demonstrate no significant findings. Lungs/Pleura: Trace biapical paraseptal emphysematous changes. Biapical pleural/pulmonary scarring. No focal consolidation. Stable scattered pulmonary micronodule within the right lung. Stable 6 mm left lower lobe pulmonary nodule (6:118). Stable 6 mm right lower lobe pulmonary nodule (6:78). No new pulmonary nodule. Pulmonary mass. No pleural effusion. No pneumothorax. Upper Abdomen:  Redemonstration of innumerable ill-defined hypodensities within the hepatic parenchyma consistent with metastases. Musculoskeletal: No abdominal wall hernia or abnormality. No suspicious lytic or blastic osseous lesions. No acute displaced fracture. Multilevel degenerative changes of the spine. Review of the MIP images confirms the above findings. IMPRESSION: 1. Right middle lobe segmental and subsegmental pulmonary embolus. Enlarged right to left ventricular ratio 1.4 suggestive of right heart strain. No pulmonary infarction. 2. Stable ascending thoracic aorta aneurysm (4.6 cm). Ascending thoracic aortic aneurysm. Recommend semi-annual imaging followup by CTA or MRA and referral to cardiothoracic surgery if not already obtained. This recommendation follows 2010 ACCF/AHA/AATS/ACR/ASA/SCA/SCAI/SIR/STS/SVM Guidelines for the Diagnosis and Management of Patients With Thoracic Aortic Disease. Circulation. 2010; 121: V893-Y101. Aortic aneurysm NOS (ICD10-I71.9) 3. Hepatic metastases poorly visualized. 4. Asymmetrically prominent left axillary lymph nodes compared to the right. 5. Couple of stable subcentimeter left internal mammary lymph nodes. 6. Stable 6 mm bilateral lower lobe pulmonary nodules. Stable scattered pulmonary micronodules within the right lung. 7. Aortic Atherosclerosis (ICD10-I70.0) and Emphysema (ICD10-J43.9). These results were called by telephone at the time of interpretation on 08/25/2021 at 10:11 pm to provider [facility] , who verbally acknowledged these results. Electronically Signed   By: Iven Finn M.D.   On: 08/25/2021 22:21   CT CHEST ABDOMEN PELVIS W CONTRAST  Addendum Date: 08/25/2021   ADDENDUM REPORT: 08/25/2021 11:30 ADDENDUM: Upper normal 10 mm short axis left thoracic inlet node on 04/02. 11 mm short axis left supraclavicular node on image 3/2 is upper normal to mildly enlarged. Electronically Signed   By: Misty Stanley M.D.   On: 08/25/2021 11:30   Result Date:  08/25/2021 CLINICAL DATA:  Gastric cancer.  Staging. EXAM: CT CHEST, ABDOMEN, AND PELVIS  WITH CONTRAST TECHNIQUE: Multidetector CT imaging of the chest, abdomen and pelvis was performed following the standard protocol during bolus administration of intravenous contrast. RADIATION DOSE REDUCTION: This exam was performed according to the departmental dose-optimization program which includes automated exposure control, adjustment of the mA and/or kV according to patient size and/or use of iterative reconstruction technique. CONTRAST:  118mL OMNIPAQUE IOHEXOL 300 MG/ML  SOLN COMPARISON:  07/01/2021. FINDINGS: CT CHEST FINDINGS Cardiovascular: The heart size is normal. No substantial pericardial effusion. Coronary artery calcification is evident. Mild atherosclerotic calcification is noted in the wall of the thoracic aorta. Ascending thoracic aorta measures 4.5 cm diameter. Right Port-A-Cath tip is positioned in the distal SVC. Mediastinum/Nodes: 16 mm short axis precarinal node has increased from 6 mm (remeasured) previously. 10 mm short axis right hilar lymph node is mildly progressive in the interval. The esophagus has normal imaging features. There is no axillary lymphadenopathy. Lungs/Pleura: Biapical pleuroparenchymal scarring noted. Numerous bilateral pulmonary nodules identified, similar to prior. Index right lower pulmonary nodule measures 6 mm on image 72/5. Index nodule posterior left lower lobe is 6 mm on 83/5. Index right lower lobe nodule measuring 9 mm identified on image 90/5. Index nodule left lower lobe on 01/12/5 measures 5 mm. No focal consolidation.  Trace left pleural effusion evident. Musculoskeletal: No worrisome lytic or sclerotic osseous abnormality. CT ABDOMEN PELVIS FINDINGS Hepatobiliary: Innumerable hepatic metastases are again noted. One of the more dominant lesions is identified in the lateral segment left liver on image 67/2 measuring 4.8 x 2.7 cm today compared to 4.3 x 2.5 cm when  remeasured in a similar fashion on the prior study. Another dominant lesion identified in segment IV measures 5.0 x 4.1 cm on 59/2, clearly increased from 2.7 x 1.8 cm when remeasured on the prior study. 3.0 cm posterior right hepatic lobe lesion on 57/2 was 2.2 cm previously (remeasured). 2.5 cm lesion in the medial right liver adjacent to the IVC has increased from 1.1 cm previously. 6.5 x 4.0 cm lesion inferior tip right liver (82/2 was measured on the previous study at 4.8 x 3.1 cm. There is no evidence for gallstones, gallbladder wall thickening, or pericholecystic fluid. No intrahepatic or extrahepatic biliary dilation. Pancreas: No focal mass lesion. No dilatation of the main duct. No intraparenchymal cyst. No peripancreatic edema. Spleen: Small infarct noted inferior spleen. (Axial 62/2 and coronal 100/6). Adrenals/Urinary Tract: No adrenal nodule or mass. Kidneys unremarkable. No evidence for hydroureter. The urinary bladder appears normal for the degree of distention. Stomach/Bowel: Stomach is decompressed. Duodenum is normally positioned as is the ligament of Treitz. No small bowel wall thickening. No small bowel dilatation. The terminal ileum is normal. The appendix is not well visualized, but there is no edema or inflammation in the region of the cecum. No gross colonic mass. No colonic wall thickening. Vascular/Lymphatic: There is mild atherosclerotic calcification of the abdominal aorta without aneurysm. Necrotic lymphadenopathy again identified in the hepatoduodenal ligament. 3.2 x 2.1 cm node visible on 63/2 was measured previously at 2.8 x 2.3 cm. 2.0 cm short axis aortocaval node seen on 72/2. 13 mm short axis right retrocrural node evident. No pelvic sidewall lymphadenopathy. Reproductive: The prostate gland and seminal vesicles are unremarkable. Other: No intraperitoneal free fluid. Musculoskeletal: Small right groin hernia contains small bowel without complicating features. There is a left  groin hernia containing a short segment of the sigmoid colon without complicating features. No worrisome lytic or sclerotic osseous abnormality. IMPRESSION: 1. Mild interval progression of mediastinal and  right hilar lymphadenopathy. 2. Similar appearance of numerous bilateral pulmonary nodules compatible with metastatic disease. 3. Interval progression of innumerable hepatic metastases. 4. Similar to mildly progressive necrotic lymphadenopathy in the hepatoduodenal ligament and aortocaval space. 5. Small inferior splenic infarct, new since prior. 6. Bilateral groin hernias containing small bowel and colon, respectively, without complicating features. 7. 4.5 cm ascending thoracic aortic aneurysm. Recommend semi-annual imaging followup by CTA or MRA and referral to cardiothoracic surgery if not already obtained. 2010; 121: [id number]. 8. Aortic Atherosclerosis (ICD10-I70.0). Electronically Signed: By: Misty Stanley M.D. On: 08/25/2021 10:01   DG Chest Port 1 View  Result Date: 08/25/2021 CLINICAL DATA:  Weakness, fever, stage IV gastric cancer EXAM: PORTABLE CHEST 1 VIEW COMPARISON:  08/24/2021, 07/01/2021 FINDINGS: Two frontal views of the chest demonstrate right chest wall port tip overlying superior vena cava. The cardiac silhouette is unremarkable. No acute airspace disease, effusion, or pneumothorax. The pulmonary nodule seen on preceding chest CT are not well visualized by x-ray. No acute bony abnormalities. IMPRESSION: 1. No acute airspace disease. 2. Please refer to CT chest performed 08/24/2021 describing intrathoracic metastatic disease not apparent by x-ray. Electronically Signed   By: Randa Ngo M.D.   On: 08/25/2021 18:13    EKG: Independently reviewed. Sinus Rhythm, lAFB, no acute changes  Assessment/Plan Principal Problem:   Pulmonary embolus, right [facility]) Active Problems:   Coronary artery disease involving native coronary artery of native heart without angina pectoris   Acute pulmonary  embolus (HCC)   Neutropenia with fever (HCC)   Mixed hyperlipidemia   Gastric cancer (HCC)    PE - patient with acute RML PE with evidence of right heart strain. Oxygenating well. Etiology - most likely related to malignancy Plan IV heparin  Transition to NOAC when stable  2. CAD - patient with h/o MI, h/o PCI/stenting LM. Nl EKG, no angina. Trpononin #1 56, #2 114. Plan Second set troponins  Continue home meds  Cardiology consult in AM  Telemetry admit  3. Gastric Cancer with mets - patient under-going chemo and has active infusion. Dr. Annamaria Boots aware of patient's admission. Plan Continue chemo  Continue pain control regimen: MS contin q12, Oxycontin IR q 4prn  Palliative care consult  4. Neutropenia with fever - WBC 10.5 with 94% segs but question of evolving neutropenia. Cannot r/o infection. Plan Blood cultures and fungal cultures ordered  ABX - started on Cefepime, Vanc and Flagyl - will continue pending cultures and resolution of fever.  APAP for fever  DVT prophylaxis: full dose heparin  Code Status: DNR - discussed with patient and wife. Living will in place. Agrees to palliative care consult  Family Communication: wife present during interview and exam. Understands Dx and Tx plan  Disposition Plan: TBD  Consults called: Oncology - Dr. Annamaria Boots called by EDP. Will need cardiology and palliative care consult   Admission status: inpatient-tele   Adella Hare MD [facility] Pager [phone]  If 7PM-7AM, please contact night-coverage [email or web address] Password [email or web address]  08/26/2021, 1:04 AM

## 2021-08-26 NOTE — Progress Notes (Signed)
Oncology Discharge Planning Admission Note  [facility] Address: [address], [place], [place] [address] Hours of Operation:  Nena Polio, Monday - Friday  Clinic Contact Information:  [phone]) [phone]  Oncology Care Team: Medical Oncologist:  Dr. Truitt Merle  Dr. Burr Medico is aware of this hospital admission dated 08/26/21.  Myrtha Mantis, NP has assessed at bedside. The cancer center will follow [facility] inpatient care to assist with discharge planning as indicated by the oncologist.  We will reach out  closer to discharge date to arrange follow up care.  Disclaimer:  This [place] note does not imply a formal consult request has been made by the admitting attending for this admission or there will be an inpatient consult completed by oncology.  Please request oncology consults as per standard process as indicated.

## 2021-08-26 NOTE — Progress Notes (Signed)
Initial Nutrition Assessment  DOCUMENTATION CODES:   Not applicable  INTERVENTION:  - will order Boost Breeze once/day, each supplement provides 250 kcal and 9 grams of protein. - will order Ensure Enlive BID, each supplement provides 350 kcal and 20 grams of protein - will order 1 tablet multivitamin with minerals/day. - complete NFPE when feasible.   NUTRITION DIAGNOSIS:   Increased nutrient needs related to acute illness, cancer and cancer related treatments as evidenced by estimated needs.  GOAL:   Patient will meet greater than or equal to 90% of their needs  MONITOR:   PO intake, Supplement acceptance, Labs, Weight trends  REASON FOR ASSESSMENT:   Malnutrition Screening Tool  ASSESSMENT:   81 y.o. male with medical history of CAD, MI and stenting of L main, internal hemorrhoids, prostatitis, HLD, HTN, squamous cell carcinoma of the skin, and basal cell carcinoma. Patient was recently dx with gastric adenocarcinoma with mets and had first cycle of palliative chemo the week PTA. He presented to the ED due to fever, weakness, shortness of breath.  Patient sleeping and no visitors at bedside initially. Noted a family member later arrived but by the time RD was able to visit patient's room, visitor had stepped out on a phone call.   Patient was called by a RD at the [facility] on 08/09/21 and that RD was able to talk with patient and his wife on the phone at that time. Note indicates patient was scheduled for FOLFOX on 08/11/21. Patient was using stool softeners BID and drinking at least 8 glasses of water/day. Patient was encouraged to eat small, frequent meals and snacks. Potential sensitivity to cold d/t FOLFOX discussed with patient.   Weight yesterday was 184 lb and weight on 07/21/21 was 194 lb. This indicates 10 lb weight loss (5.1% body weight) in 5 weeks.  No information documented in the edema section of flow sheet this hospitalization. No documented meal intakes this  hospitalization.   Labs reviewed; Na: 133 mmol/l, BUN: 24 mg/dl, Ca: 7.4 mg/dl.  Medications reviewed; 40 mg IV lasix x1 dose 1/20, 40 mg solu-medrol/day.  IVF; NS @ 75 ml/hr.    NUTRITION - FOCUSED PHYSICAL EXAM:  Unable to complete at this time.   Diet Order:   Diet Order             Diet regular Room service appropriate? Yes; Fluid consistency: Thin  Diet effective now                   EDUCATION NEEDS:   No education needs have been identified at this time  Skin:  Skin Assessment: Reviewed RN Assessment  Last BM:  PTA/unknown  Height:   Ht Readings from Last 1 Encounters:  08/25/21 6\' 1"  (1.854 m)    Weight:   Wt Readings from Last 1 Encounters:  08/25/21 83.6 kg     Estimated Nutritional Needs:  Kcal:  2100-2300 kcal Protein:  105-120 grams Fluid:  >/= 2.2 L/day      Jarome Matin, MS, RD, LDN Inpatient Clinical Dietitian RD pager # available in [place]  After hours/weekend pager # available in [facility]

## 2021-08-26 NOTE — Consult Note (Addendum)
NAME:  Billy [name], MRN:  [id number], DOB:  10-28-1940, LOS: 0 ADMISSION DATE:  08/25/2021, CONSULTATION DATE:  08/26/21 REFERRING MD:  Verlon Au - [facility], CHIEF COMPLAINT:  PE   History of Present Illness:  81 yo M formerly very active [place] who recently was diagnosed with incurable stg IV gastric adenocarcinoma, currently on palliative chemo (FOLFOX, trastuzumab + pembrolizumab, with plan to switch FOLFOX to capox after 2 cycles) , who presented to [facility] ED 1/19 with SOB and fever T 102. He was started on braod abx for possible sepsis in immunocomp host, and was sent for CTA chest which revealed R subsegmental PE and was concerning for possible RV strain with RV:LV 1.4  Of note, in review of outpt oncology note there is reference to a possible hypersensitivity reaction to pembrolizumab and trastuzumab   Started on hep gtt and admitted to [facility]. Initially hypotensive but after 3.5 L IVF this improved. On 1/20 pt had a near syncopal event when he attempted to to stand to urinate. He then exhibited worsening SOB and hypoxia and was placed on BiPAP.   Trops initially were 62 but these continue to trend upward, now 209. BNP is elevated to 493, LA 3 --suggestive of at least submassive PE  PCCM consulted in this setting   Pertinent  Medical History  Stg IV gastric adenocarcinoma   Significant Hospital Events: Including procedures, antibiotic start and stop dates in addition to other pertinent events   1/19 ED for SOB. R subsegmental PE with possible RV strain, hypoxia, hypotension. [facility] admit. Heparin gtt + broad abx for fever 1/20 Near syncopal event, worse hypoxia, recurrent hypotension. PCCM consulted. Plan for tpa   Interim History / Subjective:  Off of BiPAP Has verbally consented to tpa administration  Wife and daughter at bedside   Objective   Blood pressure 108/63, pulse 71, temperature 99.1 F (37.3 C), temperature source Rectal, resp. rate 19, height 6\' 1"  (1.854 m), weight  83.6 kg, SpO2 97 %.        Intake/Output Summary (Last 24 hours) at 08/26/2021 0825 Last data filed at 08/26/2021 0630 Gross per 24 hour  Intake 5412.5 ml  Output --  Net 5412.5 ml   Filed Weights   08/25/21 2211  Weight: 83.6 kg    Examination: General: Acutely ill appearing older adult M reclined in bed NAD HENT: NCAT pink mm anicteric sclera  Lungs: Symmetrical chest expansion, shallow respirations,  Cardiovascular:  rr s1s2  cap refill brisk  Abdomen: soft ndnt  Extremities: no acute joint deformity no cyanosis or clubbing  Neuro: AAOx3 following commands GU: condom cath clear yellow urine Skin: diffuse maculopapular rash on back   Resolved Hospital Problem list     Assessment & Plan:   Massive PE  Obstructive Shock due to Massive PE  -R subsegmental clots, but with significant hemodynamic compromise.  -trops continue to trend up (now 209) and BNP elevated, as is LA which are reflective of degree of PE  -started on heparin gtt  P - will proceed with tpa which pt has consented to - monitor in ICU  -supplemental O2 as needed -peripheral NE   Stg IV metastatic gastric adenocarcinoma  -on palliative chemo outpt  P -will notify onc of pts admission & ask if current infusion is to be continued  -Cont home morphine  -PRN Oxy ordered as well -- can escalate PRNs as needed   Fever  Maculopapular rash Leukocytosis -- suspected drug fever vs related to  PE. Doubt infectious. No infiltrates on CT, urine looks ok. Port site without signs of infection  -outpt noted to have rxn to immunologic therapy. Presented with existing maculopapular rash  which raise question of drug fever -was started empirically on abx for possible sepsis P -continue abx per onc rec  -solumedrol, benadryl for rash   GOC DNR Status -known DNR -is very clear that if  he decompensates he would not want heroic measures and he is very clear that does not want to feel pain-- current efforts for PE  management acceptable to pt as this may improve the distressing sx he is having -Palli consult this admission.     Best Practice (right click and "Reselect all SmartList Selections" daily)   Diet/type: Regular consistency (see orders) DVT prophylaxis: systemic heparin GI prophylaxis: N/A Lines: yes and it is still needed -- port Foley:  N/A Code Status:  DNR Last date of multidisciplinary goals of care discussion [1/20] D/w pt, wife and daughter   Labs   CBC: Recent Labs  Lab 08/25/21 0844 08/25/21 1910 08/26/21 0308 08/26/21 0648  WBC 12.2* 10.5 16.0* 19.6*  NEUTROABS 8.4* 9.9*  --   --   HGB 10.4* 10.5* 9.0* 10.8*  HCT 31.5* 32.6* 28.5* 34.2*  MCV 92.9 94.5 96.0 96.3  PLT 358 407* 319 007    Basic Metabolic Panel: Recent Labs  Lab 08/25/21 0844 08/25/21 1910 08/26/21 0308  NA 136 135 133*  K 4.2 4.3 4.7  CL 103 104 106  CO2 25 21* 20*  GLUCOSE 111* 113* 125*  BUN 18 22 24*  CREATININE 0.98 1.06 1.17  CALCIUM 8.8* 8.5* 7.4*   GFR: Estimated Creatinine Clearance: 56.9 mL/min (by C-G formula based on SCr of 1.17 mg/dL). Recent Labs  Lab 08/25/21 0844 08/25/21 1748 08/25/21 1910 08/25/21 2130 08/26/21 0308 08/26/21 0648  WBC 12.2*  --  10.5  --  16.0* 19.6*  LATICACIDVEN  --  2.2*  --  1.7  --  3.0*    Liver Function Tests: Recent Labs  Lab 08/25/21 0844 08/25/21 1910  AST 88* 116*  ALT 59* 71*  ALKPHOS 415* 511*  BILITOT 0.7 0.6  PROT 6.3* 6.1*  ALBUMIN 3.2* 2.7*   No results for input(s): LIPASE, AMYLASE in the last 168 hours. No results for input(s): AMMONIA in the last 168 hours.  ABG    Component Value Date/Time   PHART 7.296 (L) 08/26/2021 0641   PCO2ART 33.9 08/26/2021 0641   PO2ART 423 (H) 08/26/2021 0641   HCO3 16.0 (L) 08/26/2021 0641   ACIDBASEDEF 9.1 (H) 08/26/2021 0641   O2SAT 99.7 08/26/2021 0641     Coagulation Profile: Recent Labs  Lab 08/25/21 1910  INR 1.1    Cardiac Enzymes: No results for input(s): CKTOTAL,  CKMB, CKMBINDEX, TROPONINI in the last 168 hours.  HbA1C: Hgb A1c MFr Bld  Date/Time Value Ref Range Status  07/28/2020 10:51 AM 5.7 4.6 - 6.5 % Final    Comment:    Glycemic Control Guidelines for People with Diabetes:Non Diabetic:  <6%Goal of Therapy: <7%Additional Action Suggested:  >8%   07/25/2019 10:37 AM 5.6 4.6 - 6.5 % Final    Comment:    Glycemic Control Guidelines for People with Diabetes:Non Diabetic:  <6%Goal of Therapy: <7%Additional Action Suggested:  >8%     CBG: No results for input(s): GLUCAP in the last 168 hours.  Review of Systems:   Review of Systems  Constitutional:  Positive for fever and malaise/fatigue.  HENT: Negative.    Eyes: Negative.   Respiratory:  Positive for shortness of breath.   Cardiovascular:  Positive for chest pain.  Gastrointestinal: Negative.   Genitourinary: Negative.   Musculoskeletal: Negative.   Skin:  Positive for rash.  Neurological:  Positive for dizziness and weakness.  Endo/Heme/Allergies: Negative.   Psychiatric/Behavioral:  Positive for depression.     Past Medical History:  He,  has a past medical history of Basal cell carcinoma (05/29/2012), Basal cell carcinoma (10/23/2012), Basal cell carcinoma (12/12/2012), Basal cell carcinoma (05/26/2015), Basal cell carcinoma (05/26/2015), Basal cell carcinoma (06/08/2016), Basal cell carcinoma (09/12/2016), Basal cell carcinoma (01/29/2019), Benign neoplasm of colon, Finger fracture, colonoscopy, Hyperlipidemia, Hypertension, Internal hemorrhoids without mention of complication, Myocardial infarction [facility]), Plantar fascial fibromatosis, Prostatitis, Rib fracture, Squamous cell carcinoma of skin (07/13/2009), Squamous cell carcinoma of skin (02/17/2014), Squamous cell carcinoma of skin (01/24/2018), Squamous cell carcinoma of skin (01/24/2018), and Squamous cell carcinoma of skin.   Surgical History:   Past Surgical History:  Procedure Laterality Date   cardiac stint  2017    COLONOSCOPY  2016   IR IMAGING GUIDED PORT INSERTION  07/12/2021   IR US GUIDE BX ASP/DRAIN  07/12/2021   POLYPECTOMY     TONSILLECTOMY     VARICOCELECTOMY     Left     Social History:   reports that he quit smoking about 43 years ago. His smoking use included cigarettes. He has a 20.00 pack-year smoking history. He has never used smokeless tobacco. He reports current alcohol use of about 25.0 standard drinks per week. He reports that he does not use drugs.   Family History:  His family history includes Heart disease in his mother; Stroke in his father. There is no history of Diabetes, Prostate cancer, Colon cancer, Stomach cancer, Rectal cancer, Colon polyps, or Esophageal cancer.   Allergies Allergies  Allergen Reactions   Ofloxacin     REACTION: rash     Home Medications  Prior to Admission medications   Medication Sig Start Date End Date Taking? Authorizing Provider  ALPRAZolam Duanne Moron) 0.5 MG tablet Take 1 tablet (0.5 mg total) by mouth 2 (two) times daily as needed for anxiety. 07/07/21  Yes Biagio Borg, MD  aspirin EC 81 MG tablet Take 81 mg by mouth every evening.   Yes [provider]  atorvastatin (LIPITOR) 80 MG tablet Take 1 tablet (80 mg total) by mouth daily at 6 PM. 01/27/21  Yes Hilty, Nadean Corwin, MD  Cholecalciferol (VITAMIN D3 PO) Take 1 tablet by mouth daily.   Yes [provider]  lidocaine-prilocaine (EMLA) cream Apply 1 application topically as needed. Apply 0.33gm to PortaCath site 30 to 60 mins prior to PortaCath access. 07/27/21  Yes Truitt Merle, MD  lisinopril (ZESTRIL) 2.5 MG tablet Take 1 tablet (2.5 mg total) by mouth daily. 12/01/20  Yes Biagio Borg, MD  mirtazapine (REMERON) 7.5 MG tablet Take 1 tablet (7.5 mg total) by mouth at bedtime. 08/25/21  Yes Alla Feeling, NP  morphine (MS CONTIN) 15 MG 12 hr tablet Take 1 tablet (15 mg total) by mouth every 12 (twelve) hours. 08/25/21  Yes Alla Feeling, NP  nitroGLYCERIN (NITROSTAT) 0.4 MG SL  tablet Place 1 tablet (0.4 mg total) under the tongue every 5 (five) minutes as needed for chest pain. Max 3 doses. 03/13/18  Yes Hilty, Nadean Corwin, MD  Omega-3 Fatty Acids (FISH OIL) 500 MG CAPS Take 500 mg by mouth daily.   Yes [provider]  oxyCODONE (OXY IR/ROXICODONE) 5 MG immediate release tablet Take 1 tablet (5 mg total) by mouth every 4 (four) hours as needed for severe pain. 08/12/21  Yes Truitt Merle, MD  capecitabine (XELODA) 500 MG tablet Take 3 tablets every 12 hours, for 14 days then off for 7 days. Take after meal. 08/21/21   Truitt Merle, MD  HYDROcodone-acetaminophen (NORCO) 5-325 MG tablet Take 1 tablet by mouth every 4 (four) hours as needed for moderate pain. 08/05/21   Truitt Merle, MD  ondansetron (ZOFRAN) 8 MG tablet Take 1 tablet (8 mg total) by mouth every 8 (eight) hours as needed for nausea or vomiting. 07/27/21   Truitt Merle, MD  prochlorperazine (COMPAZINE) 10 MG tablet Take 1 tablet (10 mg total) by mouth every 6 (six) hours as needed for nausea or vomiting. 07/27/21   Truitt Merle, MD  traMADol (ULTRAM) 50 MG tablet Take 1 tablet (50 mg total) by mouth every 6 (six) hours as needed. Patient not taking: Reported on 08/25/2021 07/21/21   Lincoln Brigham, PA-C     Critical care time: 48 min     CRITICAL CARE Performed by: Cristal Generous   Total critical care time: 48 minutes  Critical care time was exclusive of separately billable procedures and treating other patients. Critical care was necessary to treat or prevent imminent or life-threatening deterioration.  Critical care was time spent personally by me on the following activities: development of treatment plan with patient and/or surrogate as well as nursing, discussions with consultants, evaluation of patient's response to treatment, examination of patient, obtaining history from patient or surrogate, ordering and performing treatments and interventions, ordering and review of laboratory studies, ordering and review  of radiographic studies, pulse oximetry and re-evaluation of patient's condition.  Eliseo Gum MSN, AGACNP-BC [place] for pager  08/26/2021, 10:00 AM

## 2021-08-26 NOTE — Progress Notes (Signed)
5FU pump DC'd at 1740 per Dr. Burr Medico request, with res volume remaining 56.2 mL. Port flushed with NS, + blood return.

## 2021-08-26 NOTE — Progress Notes (Signed)
MD would like to add Singulair 10mg  po, Pepcid 20mg  IV and change Benadryl 25 mg po to Benadryl 25mg  IV on antibody treatment plan due to symptoms patient experienced at home after Keytruda/Herceptin treatment on 08/18/21.  Raul Del Gapland, [place], BCPS, BCOP 08/26/2021 9:55 AM

## 2021-08-26 NOTE — Progress Notes (Signed)
PHARMACY NOTE:  ANTIMICROBIAL RENAL DOSAGE ADJUSTMENT  Current antimicrobial regimen includes a mismatch between antimicrobial dosage and estimated renal function.  As per policy approved by the Pharmacy & Therapeutics and Medical Executive Committees, the antimicrobial dosage will be adjusted accordingly.  Current antimicrobial dosage:  Cefepime 2gm IV q8h  Indication: sepsis  Renal Function:  Estimated Creatinine Clearance: 56.9 mL/min (by C-G formula based on SCr of 1.17 mg/dL). []      On intermittent HD, scheduled: []      On CRRT    Antimicrobial dosage has been changed to:  Cefepime 2gm IV q12h  Additional comments:   Thank you for allowing pharmacy to be a part of this patient's care.  [name], [facility] 08/26/2021 5:22 AM

## 2021-08-26 NOTE — Progress Notes (Signed)
° ° °  OVERNIGHT PROGRESS REPORT   Notified by RN for rapid change of respiratory status while attempting to stand to urinate. Patient became tachycardic and developed tachypnea/desaturated. Staff had patient on NRB mask on arrival.   Patient orders in for BiPAP, ABG, Lasix, BNP, Troponins. IVF stopped.  Transfer to SDU initiated.    UPDATE: Patient showing improvement at the time of this note pending move to SDU.  Labs, and Imaging are in process.       Gershon Cull MSNA MSN ACNPC-AG Acute Care Nurse Practitioner [place]

## 2021-08-26 NOTE — Progress Notes (Incomplete)
RN took the BIPAP off the Pt in the ED. RT placed the pt on a NRB for transport. RT will continue to monitor

## 2021-08-26 NOTE — Addendum Note (Signed)
Addended by: Neysa Hotter on: 08/26/2021 09:57 AM   Modules accepted: Orders

## 2021-08-26 NOTE — Progress Notes (Signed)
Verbal order taken from Dr. Burr Medico to discontinue 5FU pump today which is earlier than expected due to pulmonary embolism.  She would like the medication discontinued. Gardiner Rhyme, RN

## 2021-08-27 ENCOUNTER — Inpatient Hospital Stay: Payer: Medicare HMO

## 2021-08-27 DIAGNOSIS — C161 Malignant neoplasm of fundus of stomach: Secondary | ICD-10-CM

## 2021-08-27 DIAGNOSIS — Z515 Encounter for palliative care: Secondary | ICD-10-CM

## 2021-08-27 LAB — CBC
HCT: 31 % — ABNORMAL LOW (ref 39.0–52.0)
Hemoglobin: 9.9 g/dL — ABNORMAL LOW (ref 13.0–17.0)
MCH: 30.6 pg (ref 26.0–34.0)
MCHC: 31.9 g/dL (ref 30.0–36.0)
MCV: 95.7 fL (ref 80.0–100.0)
Platelets: 317 10*3/uL (ref 150–400)
RBC: 3.24 MIL/uL — ABNORMAL LOW (ref 4.22–5.81)
RDW: 14.2 % (ref 11.5–15.5)
WBC: 15.6 10*3/uL — ABNORMAL HIGH (ref 4.0–10.5)
nRBC: 0 % (ref 0.0–0.2)

## 2021-08-27 LAB — BASIC METABOLIC PANEL
Anion gap: 7 (ref 5–15)
BUN: 35 mg/dL — ABNORMAL HIGH (ref 8–23)
CO2: 20 mmol/L — ABNORMAL LOW (ref 22–32)
Calcium: 7.7 mg/dL — ABNORMAL LOW (ref 8.9–10.3)
Chloride: 106 mmol/L (ref 98–111)
Creatinine, Ser: 1.02 mg/dL (ref 0.61–1.24)
GFR, Estimated: >60 mL/min (ref >60)
Glucose, Bld: 128 mg/dL — ABNORMAL HIGH (ref 70–99)
Potassium: 4 mmol/L (ref 3.5–5.1)
Sodium: 133 mmol/L — ABNORMAL LOW (ref 135–145)

## 2021-08-27 LAB — HEPARIN LEVEL (UNFRACTIONATED)
Heparin Unfractionated: 0.84 IU/mL — ABNORMAL HIGH (ref 0.30–0.70)
Heparin Unfractionated: 0.25 IU/mL — ABNORMAL LOW (ref 0.30–0.70)

## 2021-08-27 MED ORDER — SODIUM CHLORIDE 0.9% FLUSH: 10.0000 mL | INTRAVENOUS | Status: DC | PRN

## 2021-08-27 MED ORDER — HEPARIN (PORCINE) 25000 UT/250ML-% IV SOLN
1250.0000 [IU]/h | INTRAVENOUS | Status: DC
  Administered 2021-08-27: 1200 [IU]/h via INTRAVENOUS
  Filled 2021-08-27: qty 250

## 2021-08-27 MED ORDER — SODIUM CHLORIDE 0.9% FLUSH
10.0000 mL | Freq: Two times a day (BID) | INTRAVENOUS | Status: DC
  Administered 2021-08-27 – 2021-08-28 (×3): 10 mL
  Administered 2021-08-28: 20 mL
  Administered 2021-08-29: 10 mL
  Administered 2021-08-29: 20 mL
  Administered 2021-08-30 – 2021-09-03 (×8): 10 mL

## 2021-08-27 NOTE — Progress Notes (Signed)
Pt is resting well at this time and doesn't want the bipap. Machine bedside. No resp distress noted. RN aware.

## 2021-08-27 NOTE — Progress Notes (Signed)
ANTICOAGULATION CONSULT NOTE - Follow Up Consult  Pharmacy Consult for heparin Indication:  acute pulmonary embolus  Allergies  Allergen Reactions   Ofloxacin     REACTION: rash    Patient Measurements: Height: 6\' 1"  (185.4 cm) Weight: 83.6 kg (184 lb 4.8 oz) IBW/kg (Calculated) : 79.9 Heparin Dosing Weight: 84 kg  Vital Signs: Temp: 97.8 F (36.6 C) (01/21 2006) Temp Source: Oral (01/21 2006) BP: 107/75 (01/21 2100) Pulse Rate: 67 (01/21 2100)  Labs: Recent Labs    08/25/21 1910 08/25/21 2130 08/26/21 0308 08/26/21 0518 08/26/21 8938 08/26/21 1017 08/26/21 0806 08/26/21 1135 08/26/21 2100 08/27/21 0818 08/27/21 0905 08/27/21 2100  HGB 10.5*  --  9.0*  --  10.8*  --   --  9.4*  --   --  9.9*  --   HCT 32.6*  --  28.5*  --  34.2*  --   --  29.7*  --   --  31.0*  --   PLT 407*  --  319  --  388  --   --  305  --   --  317  --   APTT  --   --   --   --   --   --   --  62*  --   --   --   --   LABPROT 14.4  --   --   --   --   --   --  17.8*  --   --   --   --   INR 1.1  --   --   --   --   --   --  1.5*  --   --   --   --   HEPARINUNFRC  --   --   --   --   --    < > 0.28*  --  0.19* 0.84*  --  0.25*  CREATININE 1.06  --  1.17  --   --   --   --   --   --   --  1.02  --   TROPONINIHS 62*   < > 160* 179* 191*  --  209*  --   --   --   --   --    < > = values in this interval not displayed.     Estimated Creatinine Clearance: 65.3 mL/min (by C-G formula based on SCr of 1.02 mg/dL).   Assessment: Billy [name] is a 81 y.o. 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  Chest CTA came back with acute PE with evidence of right heart strain. He was started on heparin on admission.  Peripheral tPA 50mg  IV x1 given on 1/20. Heparin drip resumed after administration of tPA.  Today, 08/27/2021: - heparin  level is 0.25- now subtherapeutic drip infusing at 1200 units/hr -RN reports no infusion issues and no bleeding noted  Goal of Therapy:  Heparin level 0.3-0.5 units/ml for 24 hrs after tPA, then 0.3-0.7 starting at noon on 1/21 Monitor platelets by anticoagulation protocol: Yes   Plan:  - Increase heparin drip to 1250 units/hr  - check 8 hr heparin level  - monitor for s/sx bleeding  [name] PharmD 08/27/2021,9:58 PM

## 2021-08-27 NOTE — Progress Notes (Signed)
NAME:  Billy [name], MRN:  [id number], DOB:  Mar 23, 1941, LOS: 1 ADMISSION DATE:  08/25/2021, CONSULTATION DATE:  08/26/21 REFERRING MD:  Verlon Au - TRH, CHIEF COMPLAINT:  PE   History of Present Illness:  81 yo M formerly very active [employer] who recently was diagnosed with incurable stg IV gastric adenocarcinoma, currently on palliative chemo (FOLFOX, trastuzumab + pembrolizumab, with plan to switch FOLFOX to capox after 2 cycles) , who presented to [facility] ED 1/19 with SOB and fever T 102. He was started on braod abx for possible sepsis in immunocomp host, and was sent for CTA chest which revealed R subsegmental PE and was concerning for possible RV strain with RV:LV 1.4  Of note, in review of outpt oncology note there is reference to a possible hypersensitivity reaction to pembrolizumab and trastuzumab   Started on hep gtt and admitted to [facility]. Initially hypotensive but after 3.5 L IVF this improved. On 1/20 pt had a near syncopal event when he attempted to to stand to urinate. He then exhibited worsening SOB and hypoxia and was placed on BiPAP.   Trops initially were 62 but these continue to trend upward, now 209. BNP is elevated to 493, LA 3 --suggestive of at least submassive PE  PCCM consulted in this setting   Pertinent  Medical History  Stg IV gastric adenocarcinoma   Significant Hospital Events: Including procedures, antibiotic start and stop dates in addition to other pertinent events   1/19 ED for SOB. R subsegmental PE with possible RV strain, hypoxia, hypotension. TRH admit. Heparin gtt + broad abx for fever 1/20 Near syncopal event, worse hypoxia, recurrent hypotension. PCCM consulted. Rx TPA  Scheduled Meds:  atorvastatin  80 mg Oral q1800   Chlorhexidine Gluconate Cloth  6 each Topical Q0600   docusate sodium  100 mg Oral BID   feeding supplement  1 Container Oral Q24H   feeding supplement  237 mL Oral BID BM   methylPREDNISolone (SOLU-MEDROL) injection  40 mg  Intravenous Q24H   mirtazapine  7.5 mg Oral QHS   morphine  15 mg Oral Q12H   multivitamin with minerals  1 tablet Oral Daily   polyethylene glycol  17 g Oral Daily   senna  1 tablet Oral BID   Continuous Infusions:  sodium chloride 75 mL/hr at 08/27/21 0257   sodium chloride 250 mL (08/26/21 1152)   ceFEPime (MAXIPIME) IV Stopped (08/27/21 0555)   heparin 1,350 Units/hr (08/27/21 0600)   norepinephrine (LEVOPHED) Adult infusion 2 mcg/min (08/27/21 0337)   vancomycin Stopped (08/27/21 0005)   PRN Meds:.acetaminophen **OR** acetaminophen, ALPRAZolam, diphenhydrAMINE, lip balm, ondansetron, oxyCODONE    Interim History / Subjective:  Feeling much better but still on levophed infusion, no cp or sob on RA    Objective   Blood pressure (!) 107/56, pulse 72, temperature (!) 97.5 F (36.4 C), temperature source Axillary, resp. rate 10, height 6\' 1"  (1.854 m), weight 83.6 kg, SpO2 97 %.        Intake/Output Summary (Last 24 hours) at 08/27/2021 0713 Last data filed at 08/27/2021 0600 Gross per 24 hour  Intake 601.51 ml  Output 1500 ml  Net -898.49 ml    Filed Weights   08/25/21 2211  Weight: 83.6 kg    Examination: Tmax  99.9 down from 103 on 1/19  General appearance:    pleasant / not toxic elderly wm nad   At Rest 02 sats  96% on RA  No jvd Oropharynx clear,  mucosa nl Neck supple Lungs clear bilaterally RRR no s3 or or sign murmur Abd soft nl  excursion  Extr warm with no edema or clubbing noted Neuro  Sensorium intact,  no apparent motor deficits;   Resolved Hospital Problem list     Assessment & Plan:   Massive PE  Obstructive Shock due to Massive PE  -R subsegmental clots, but with significant hemodynamic compromise.  -trops continue to trend up (now 209) and BNP elevated, as is LA which are reflective of degree of PE  S/p TPA  1/20 improved am 1/21 but still on levophed  >>> wean off levophed      Stg IV metastatic gastric adenocarcinoma  -on  palliative chemo outpt  P Leave on hep thru weekend with ? Hypercoagulable state ? Change to LMWH ?   Fever  Maculopapular rash Leukocytosis -- suspected drug fever vs related to PE. Doubt infectious. No infiltrates on CT, urine looks ok. Port site without signs of infection  -outpt noted to have rxn to immunologic therapy. Presented with existing maculopapular rash  which raise question of drug fever -was started empirically on abx for possible sepsis P -continue abx per onc rec  -solumedrol, benadryl for rash  >>> resolved am 1/21  GOC DNR Status        Best Practice (right click and "Reselect all SmartList Selections" daily)   Diet/type: Regular consistency (see orders) DVT prophylaxis: systemic heparin GI prophylaxis: N/A Lines: yes and it is still needed -- port Foley:  N/A Code Status:  DNR Last date of multidisciplinary goals of care discussion [1/20] D/w pt, wife and daughter   Labs   CBC: Recent Labs  Lab 08/25/21 0844 08/25/21 1910 08/26/21 0308 08/26/21 0648 08/26/21 1135  WBC 12.2* 10.5 16.0* 19.6* 16.8*  NEUTROABS 8.4* 9.9*  --   --   --   HGB 10.4* 10.5* 9.0* 10.8* 9.4*  HCT 31.5* 32.6* 28.5* 34.2* 29.7*  MCV 92.9 94.5 96.0 96.3 95.5  PLT 358 407* 319 388 305     Basic Metabolic Panel: Recent Labs  Lab 08/25/21 0844 08/25/21 1910 08/26/21 0308  NA 136 135 133*  K 4.2 4.3 4.7  CL 103 104 106  CO2 25 21* 20*  GLUCOSE 111* 113* 125*  BUN 18 22 24*  CREATININE 0.98 1.06 1.17  CALCIUM 8.8* 8.5* 7.4*    GFR: Estimated Creatinine Clearance: 56.9 mL/min (by C-G formula based on SCr of 1.17 mg/dL). Recent Labs  Lab 08/25/21 1748 08/25/21 1910 08/25/21 2130 08/26/21 0308 08/26/21 0648 08/26/21 1135  WBC  --  10.5  --  16.0* 19.6* 16.8*  LATICACIDVEN 2.2*  --  1.7  --  3.0* 1.4     Liver Function Tests: Recent Labs  Lab 08/25/21 0844 08/25/21 1910  AST 88* 116*  ALT 59* 71*  ALKPHOS 415* 511*  BILITOT 0.7 0.6  PROT 6.3* 6.1*   ALBUMIN 3.2* 2.7*    No results for input(s): LIPASE, AMYLASE in the last 168 hours. No results for input(s): AMMONIA in the last 168 hours.  ABG    Component Value Date/Time   PHART 7.296 (L) 08/26/2021 0641   PCO2ART 33.9 08/26/2021 0641   PO2ART 423 (H) 08/26/2021 0641   HCO3 16.0 (L) 08/26/2021 0641   ACIDBASEDEF 9.1 (H) 08/26/2021 0641   O2SAT 99.7 08/26/2021 0641      Coagulation Profile: Recent Labs  Lab 08/25/21 1910 08/26/21 1135  INR 1.1 1.5*     Cardiac Enzymes:  No results for input(s): CKTOTAL, CKMB, CKMBINDEX, TROPONINI in the last 168 hours.  HbA1C: Hgb A1c MFr Bld  Date/Time Value Ref Range Status  07/28/2020 10:51 AM 5.7 4.6 - 6.5 % Final    Comment:    Glycemic Control Guidelines for People with Diabetes:Non Diabetic:  <6%Goal of Therapy: <7%Additional Action Suggested:  >8%   07/25/2019 10:37 AM 5.6 4.6 - 6.5 % Final    Comment:    Glycemic Control Guidelines for People with Diabetes:Non Diabetic:  <6%Goal of Therapy: <7%Additional Action Suggested:  >8%     CBG: No results for input(s): GLUCAP in the last 168 hours.   Christinia Gully, MD Pulmonary and [place] [phone]   After 7:00 pm call Elink  [phone]

## 2021-08-27 NOTE — Progress Notes (Signed)
ANTICOAGULATION CONSULT NOTE - Follow Up Consult  Pharmacy Consult for heparin Indication:  acute pulmonary embolus  Allergies  Allergen Reactions   Ofloxacin     REACTION: rash    Patient Measurements: Height: 6\' 1"  (185.4 cm) Weight: 83.6 kg (184 lb 4.8 oz) IBW/kg (Calculated) : 79.9 Heparin Dosing Weight: 84 kg  Vital Signs: Temp: 97.5 F (36.4 C) (01/21 0345) Temp Source: Axillary (01/21 0345) BP: 107/56 (01/21 0645) Pulse Rate: 72 (01/21 0645)  Labs: Recent Labs    08/25/21 0844 08/25/21 1910 08/25/21 2130 08/26/21 0308 08/26/21 0518 08/26/21 0648 08/26/21 0806 08/26/21 1135 08/26/21 2100  HGB 10.4* 10.5*  --  9.0*  --  10.8*  --  9.4*  --   HCT 31.5* 32.6*  --  28.5*  --  34.2*  --  29.7*  --   PLT 358 407*  --  319  --  388  --  305  --   APTT  --   --   --   --   --   --   --  62*  --   LABPROT  --  14.4  --   --   --   --   --  17.8*  --   INR  --  1.1  --   --   --   --   --  1.5*  --   HEPARINUNFRC  --   --   --   --   --   --  0.28*  --  0.19*  CREATININE 0.98 1.06  --  1.17  --   --   --   --   --   TROPONINIHS  --  62*   < > 160* 179* 191* 209*  --   --    < > = values in this interval not displayed.     Estimated Creatinine Clearance: 56.9 mL/min (by C-G formula based on SCr of 1.17 mg/dL).   Assessment: Billy [name] is a 81 y.o. 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  Chest CTA came back with acute PE with evidence of right heart strain. He was started on heparin on admission.  Peripheral tPA 50mg  IV x1 given on 1/20. Heparin drip resumed after administration of tPA.  Today, 08/27/2021: - heparin level is supra- therapeutic at 0.84 with drip infusing at 1350 units/hr -Pt's RN reported that heparin drip is infusing on the left arm and she collected the heparin level from  the right port. She also reports no bleeding noted  Goal of Therapy:  Heparin level 0.3-0.5 units/ml for 24 hrs after tPA, then 0.3-0.7 starting at noon on 1/21 Monitor platelets by anticoagulation protocol: Yes   Plan:  - hold heparin drip x1 hour, then resume at 1200 units/hr  - check 8 hr heparin level  - monitor for s/sx bleeding  Devynn Hessler P 08/27/2021,7:23 AM

## 2021-08-27 NOTE — Consult Note (Addendum)
Consultation Note Date: 08/27/2021   Patient Name: Billy [name]  DOB: Dec 23, 1940  MRN: [id number]  Age / Sex: 81 y.o., male  PCP: Biagio Borg, MD Referring Physician: Julian Hy, DO  Reason for Consultation: Establishing goals of care  HPI/Patient Profile: 81 y.o. male admitted on 08/25/2021   Clinical Assessment and Goals of Care: 81 year old gentleman with a history of metastatic gastric cancer, receiving systemic chemotherapy, admitted to hospital with shortness of breath and fever, started on broad-spectrum antibiotics due to concerns for sepsis, CT chest showing right subsegmental PE as well as concern for possible right ventricular strain, started on heparin drip, near syncopal event and worsening hypoxia and recurrent hypotension hence, received tPA. Remains in the intensive care unit.  Awake alert oriented, is able to eat by himself and is able to have conversations on his cell phone.  Remains on Levophed. Chart reviewed, patient is on MS Contin 15 mg p.o. twice daily, is on oxycodone immediate release on an as-needed basis for pain/shortness of breath.  Palliative medicine consultation for offering additional support, monitoring hospital course and continuing broad goals of care discussions has been requested. Patient is awake alert resting in bed. Palliative medicine is specialized medical care for people living with serious illness. It focuses on providing relief from the symptoms and stress of a serious illness. The goal is to improve quality of life for both the patient and the family. Goals of care: Broad aims of medical therapy in relation to the patient's values and preferences. Our aim is to provide medical care aimed at enabling patients to achieve the goals that matter most to them, given the circumstances of their particular medical situation and their constraints.  If life review  performed.  Patient is aware of the serious nature of his illness.  Patient is thankful that his shortness of breath is better.  He states that medical staff has been doing a good job of explaining things to him.  He hopes for ongoing stabilization although is aware of his life limiting illness of metastatic gastric cancer.  NEXT OF KIN Lives at home with his wife in [place], [place].  SUMMARY OF RECOMMENDATIONS   Agree with DNR Continue current mode of care Patient is aware that he is going to be on lifelong anticoagulation from now on. Recommend ongoing outpatient palliative care support at the [facility], will coordinate with palliative NP. Thank you for the consult.  Code Status/Advance Care Planning: DNR   Symptom Management:    Palliative Prophylaxis:  Frequent Pain Assessment   Psycho-social/Spiritual:  Desire for further Chaplaincy support:yes Additional Recommendations: Caregiving  Support/Resources  Prognosis:  Unable to determine  Discharge Planning: To Be Determined      Primary Diagnoses: Present on Admission:  Mixed hyperlipidemia  Gastric cancer ([place])  Acute pulmonary embolus ([place])  Pulmonary embolus, right (HCC)  Coronary artery disease involving native coronary artery of native heart without angina pectoris  Neutropenia with fever ([place])   I have reviewed the medical  record, interviewed the patient and family, and examined the patient. The following aspects are pertinent.  Past Medical History:  Diagnosis Date   Basal cell carcinoma 05/29/2012   left thigh-(EXC)   Basal cell carcinoma 10/23/2012   right side of nose (MOHS)   Basal cell carcinoma 12/12/2012   nod-irght cheek (MOHS)   Basal cell carcinoma 05/26/2015   nod-left calf (CX35FU)   Basal cell carcinoma 05/26/2015   nod-left nose (txpbx)   Basal cell carcinoma 06/08/2016   nod-above right brow (CX35FU)   Basal cell carcinoma 09/12/2016   left nose (MOHS)    Basal cell carcinoma 01/29/2019   nod-right tip of nose (MOHS)   Benign neoplasm of colon    Finger fracture    Hx of colonoscopy    Hyperlipidemia    Hypertension    Internal hemorrhoids without mention of complication    Myocardial infarction ([place])    09/01/2015   Plantar fascial fibromatosis    Prostatitis    Rib fracture    Squamous cell carcinoma of skin 07/13/2009   RIght lower outer leg   Squamous cell carcinoma of skin 02/17/2014   in situ-right temple (txpbx)   Squamous cell carcinoma of skin 01/24/2018   in situ-right chest (txpbx)   Squamous cell carcinoma of skin 01/24/2018   in situ0 right forehead (txpbx)   Squamous cell carcinoma of skin    Social History   Socioeconomic History   Marital status: Married    Spouse name: Not on file   Number of children: 2   Years of education: Not on file   Highest education level: Not on file  Occupational History   Occupation: Retired  Tobacco Use   Smoking status: Former    Packs/day: 1.00    Years: 20.00    Pack years: 20.00    Types: Cigarettes    Quit date: 08/07/1978    Years since quitting: 43.0   Smokeless tobacco: Never  Vaping Use   Vaping Use: Never used  Substance and Sexual Activity   Alcohol use: Yes    Alcohol/week: 25.0 standard drinks    Types: 10 Cans of beer, 15 Standard drinks or equivalent per week   Drug use: No   Sexual activity: Not on file  Other Topics Concern   Not on file  Social History Narrative   [employer], [place]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, clinical (histocompatibility and immunogenetics); Estate manager/land agent license   [employer] - 6 years      Married - '69   1 son - '78, 1 daughter '74 2 grandchildren      Retired - stays busy, 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 CAD. died 76   Stroke Father        at age 85, smoked cigar   Diabetes Neg Hx    Prostate cancer Neg Hx    Colon cancer Neg Hx    Stomach cancer Neg Hx    Rectal cancer Neg Hx  Colon polyps Neg Hx    Esophageal cancer Neg Hx    Scheduled Meds:  atorvastatin  80 mg Oral q1800   Chlorhexidine Gluconate Cloth  6 each Topical Q0600   docusate sodium  100 mg Oral BID   feeding supplement  1 Container Oral Q24H   feeding supplement  237 mL Oral BID BM   methylPREDNISolone (SOLU-MEDROL) injection  40 mg Intravenous Q24H   mirtazapine  7.5 mg Oral QHS   morphine  15 mg Oral Q12H   multivitamin with minerals  1 tablet Oral Daily   polyethylene glycol  17 g Oral Daily   senna  1 tablet Oral BID   sodium chloride flush  10-40 mL Intracatheter Q12H   Continuous Infusions:  sodium chloride 75 mL/hr at 08/27/21 0257   sodium chloride 250 mL (08/26/21 1152)   ceFEPime (MAXIPIME) IV Stopped (08/27/21 0555)   heparin 1,200 Units/hr (08/27/21 1142)   norepinephrine (LEVOPHED) Adult infusion 2 mcg/min (08/27/21 1136)   vancomycin Stopped (08/27/21 0005)   PRN Meds:.acetaminophen **OR** acetaminophen, ALPRAZolam, diphenhydrAMINE, lip balm, ondansetron, oxyCODONE, sodium chloride flush Medications Prior to Admission:  Prior to Admission medications   Medication Sig Start Date End Date Taking? Authorizing Provider  ALPRAZolam Duanne Moron) 0.5 MG tablet Take 1 tablet (0.5 mg total) by mouth 2 (two) times daily as needed for anxiety. 07/07/21  Yes Biagio Borg, MD  aspirin EC 81 MG tablet Take 81 mg by mouth every evening.   Yes [provider]  atorvastatin (LIPITOR) 80 MG tablet Take 1 tablet (80 mg total) by mouth daily at 6 PM. 01/27/21  Yes Hilty, Nadean Corwin, MD  Cholecalciferol (VITAMIN D3 PO) Take 1 tablet by mouth daily.   Yes [provider]  lidocaine-prilocaine (EMLA) cream Apply 1 application topically as needed. Apply 0.33gm to PortaCath site 30 to 60 mins prior to PortaCath access. 07/27/21  Yes Truitt Merle, MD  lisinopril (ZESTRIL) 2.5 MG tablet Take 1 tablet (2.5 mg total) by mouth daily. 12/01/20  Yes Biagio Borg, MD  mirtazapine (REMERON) 7.5 MG tablet Take 1 tablet (7.5 mg total) by mouth at bedtime. 08/25/21  Yes Alla Feeling, NP  morphine (MS CONTIN) 15 MG 12 hr tablet Take 1 tablet (15 mg total) by mouth every 12 (twelve) hours. 08/25/21  Yes Alla Feeling, NP  nitroGLYCERIN (NITROSTAT) 0.4 MG SL tablet Place 1 tablet (0.4 mg total) under the tongue every 5 (five) minutes as needed for chest pain. Max 3 doses. 03/13/18  Yes Hilty, Nadean Corwin, MD  Omega-3 Fatty Acids (FISH OIL) 500 MG CAPS Take 500 mg by mouth daily.   Yes [provider]  oxyCODONE (OXY IR/ROXICODONE) 5 MG immediate release tablet Take 1 tablet (5 mg total) by mouth every 4 (four) hours as needed for severe pain. 08/12/21  Yes Truitt Merle, MD  capecitabine (XELODA) 500 MG tablet Take 3 tablets every 12 hours, for 14 days then off for 7 days. Take after meal. 08/21/21   Truitt Merle, MD  HYDROcodone-acetaminophen (NORCO) 5-325 MG tablet Take 1 tablet by mouth every 4 (four) hours as needed for moderate pain. 08/05/21   Truitt Merle, MD  ondansetron (ZOFRAN) 8 MG tablet Take 1 tablet (8 mg total) by mouth every 8 (eight) hours as needed for nausea or vomiting. 07/27/21   Truitt Merle, MD  prochlorperazine (COMPAZINE) 10 MG tablet Take 1 tablet (10 mg total) by mouth every 6 (six) hours as needed for nausea or vomiting.  07/27/21   Truitt Merle, MD  traMADol (ULTRAM) 50 MG tablet Take 1 tablet (50 mg total) by mouth every 6 (six) hours as needed. Patient not taking: Reported on 08/25/2021 07/21/21   Dede Query T, PA-C   Allergies  Allergen Reactions   Ofloxacin     REACTION: rash   Review of Systems Less short of breath.   Physical Exam Awake  alert oriented resting in bed Is able to feed himself breakfast Is able to have conversations on his cell phone Regular work of breathing S1-S2 Extremities with no edema Awake alert oriented no focal deficits  Vital Signs: BP 124/89    Pulse 72    Temp 97.8 F (36.6 C) (Oral)    Resp 15    Ht 6\' 1"  (1.854 m)    Wt 83.6 kg    SpO2 97%    BMI 24.32 kg/m  Pain Scale: 0-10   Pain Score: 0-No pain   SpO2: SpO2: 97 % O2 Device:SpO2: 97 % O2 Flow Rate: .O2 Flow Rate (L/min): 3 L/min  IO: Intake/output summary:  Intake/Output Summary (Last 24 hours) at 08/27/2021 1344 Last data filed at 08/27/2021 1201 Gross per 24 hour  Intake 2026.02 ml  Output 1900 ml  Net 126.02 ml    LBM: Last BM Date:  (PTA) Baseline Weight: Weight: 83.6 kg Most recent weight: Weight: 83.6 kg     Palliative Assessment/Data:   PPS 50%  Time In: 12 Time Out: 1300 Time Total: 60   Greater than 50%  of this time was spent counseling and coordinating care related to the above assessment and plan.  Signed by: Loistine Chance, MD   Please contact Palliative Medicine Team phone at [phone] for questions and concerns.  For individual provider: See Shea Evans

## 2021-08-28 ENCOUNTER — Inpatient Hospital Stay (HOSPITAL_COMMUNITY): Payer: Medicare HMO

## 2021-08-28 DIAGNOSIS — S7010XA Contusion of unspecified thigh, initial encounter: Secondary | ICD-10-CM | POA: Diagnosis not present

## 2021-08-28 DIAGNOSIS — R579 Shock, unspecified: Secondary | ICD-10-CM | POA: Diagnosis present

## 2021-08-28 LAB — CBC
HCT: 27.5 % — ABNORMAL LOW (ref 39.0–52.0)
HCT: 26.4 % — ABNORMAL LOW (ref 39.0–52.0)
Hemoglobin: 8.7 g/dL — ABNORMAL LOW (ref 13.0–17.0)
Hemoglobin: 8.4 g/dL — ABNORMAL LOW (ref 13.0–17.0)
MCH: 30.3 pg (ref 26.0–34.0)
MCH: 30.3 pg (ref 26.0–34.0)
MCHC: 31.6 g/dL (ref 30.0–36.0)
MCHC: 31.8 g/dL (ref 30.0–36.0)
MCV: 95.8 fL (ref 80.0–100.0)
MCV: 95.3 fL (ref 80.0–100.0)
Platelets: 274 10*3/uL (ref 150–400)
Platelets: 272 10*3/uL (ref 150–400)
RBC: 2.87 MIL/uL — ABNORMAL LOW (ref 4.22–5.81)
RBC: 2.77 MIL/uL — ABNORMAL LOW (ref 4.22–5.81)
RDW: 14.2 % (ref 11.5–15.5)
RDW: 14.2 % (ref 11.5–15.5)
WBC: 11.5 10*3/uL — ABNORMAL HIGH (ref 4.0–10.5)
WBC: 11 10*3/uL — ABNORMAL HIGH (ref 4.0–10.5)
nRBC: 0 % (ref 0.0–0.2)
nRBC: 0 % (ref 0.0–0.2)

## 2021-08-28 LAB — HEPARIN LEVEL (UNFRACTIONATED)
Heparin Unfractionated: 0.34 IU/mL (ref 0.30–0.70)
Heparin Unfractionated: 0.29 IU/mL — ABNORMAL LOW (ref 0.30–0.70)

## 2021-08-28 IMAGING — MR MR PELVIS W/O CM
6 series · 48 of 48 positions shown · non-contrast
Comparison: Radiographs [DATE] and CT pelvis from [DATE] in
addition to lumbar MRI from [DATE]

CLINICAL DATA: Right groin pain.  Metastatic gastric cancer.

EXAM:
MRI PELVIS WITHOUT CONTRAST
TECHNIQUE: Multiplanar multisequence MR imaging of the pelvis was performed. No
intravenous contrast was administered.

[Series 2: T1 · coronal · 4.0mm · 1.19mm/px · 6 of 35 slices shown (1 of 2)]
[im 1/35]
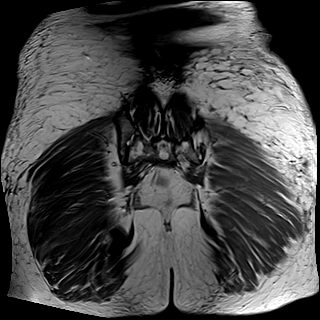
[im 7/35]
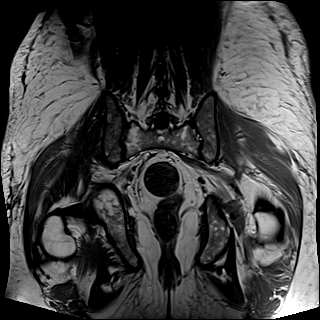
[im 14/35]
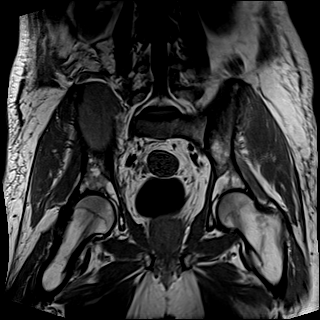
[im 21/35]
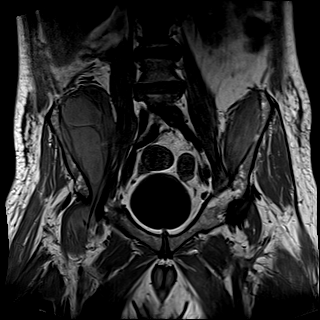
[im 28/35]
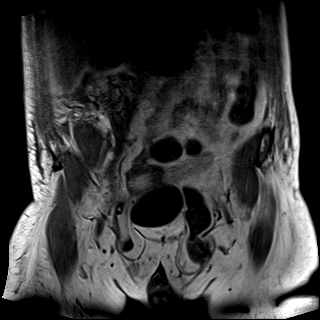
[im 35/35]
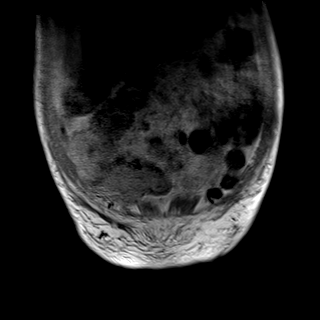

[Series 3: STIR · coronal · 4.0mm · 1.19mm/px · 7 of 35 slices shown]
[im 1/35]
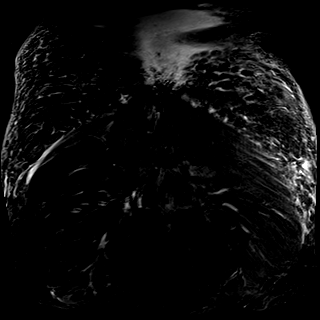
[im 6/35]
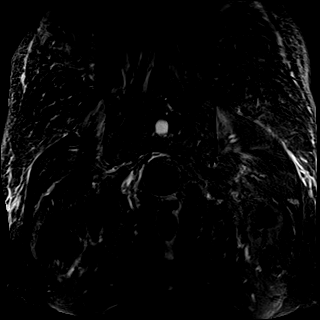
[im 12/35]
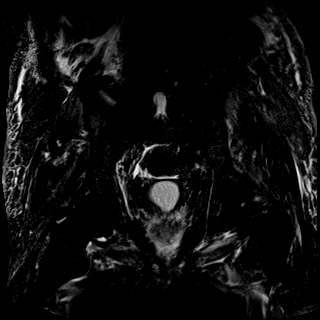
[im 18/35]
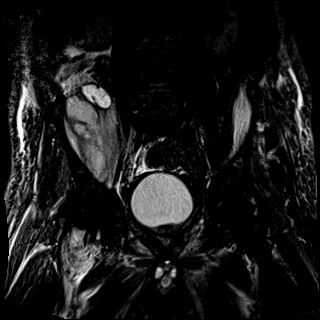
[im 23/35]
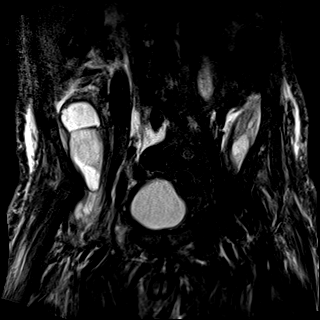
[im 29/35]
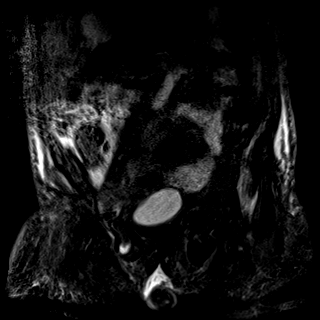
[im 35/35]
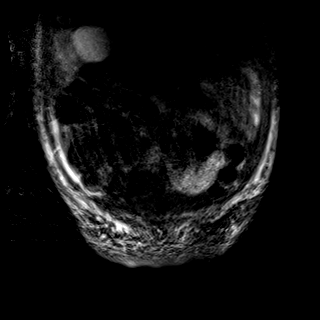

[Series 4: T1 · axial · 4.0mm · 1.09mm/px · z∈[-166,+69]mm · 9 of 48 slices shown (2 of 2)]
[im 1/48]
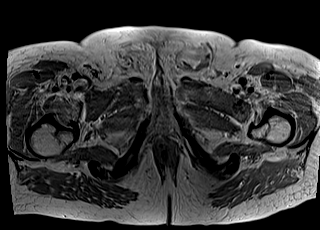
[im 6/48]
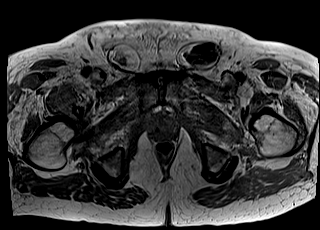
[im 12/48]
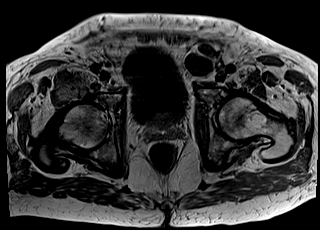
[im 18/48]
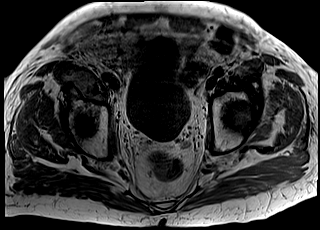
[im 24/48]
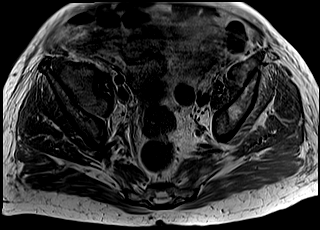
[im 30/48]
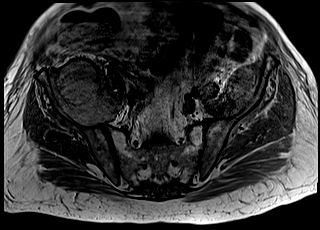
[im 36/48]
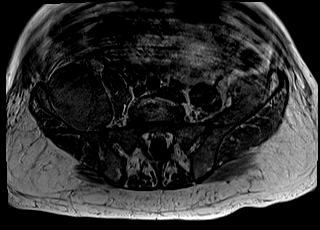
[im 42/48]
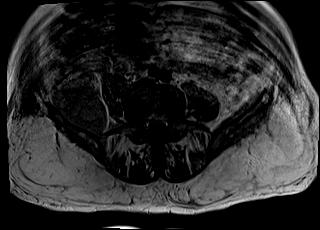
[im 48/48]
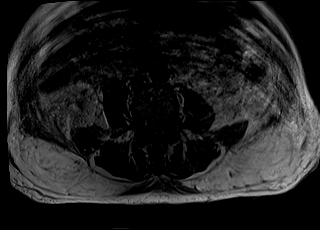

[Series 5: T2 fat-sat · axial · 4.0mm · 0.94mm/px · z∈[-166,+69]mm · 9 of 48 slices shown (1 of 3)]
[im 1/48]
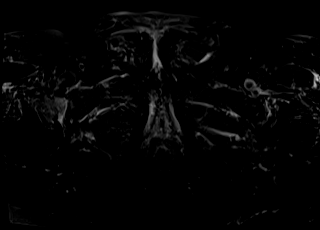
[im 6/48]
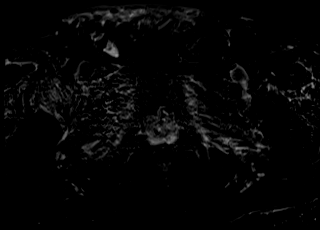
[im 12/48]
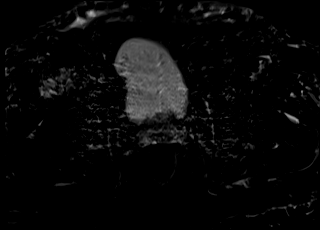
[im 18/48]
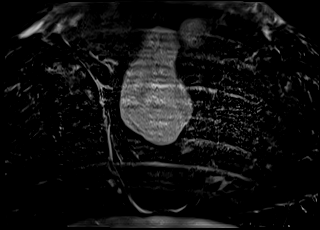
[im 24/48]
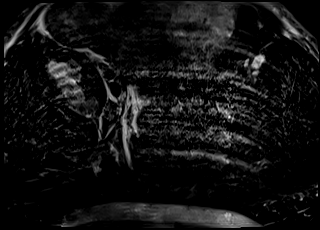
[im 30/48]
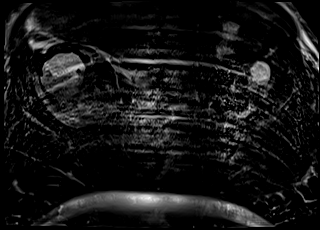
[im 36/48]
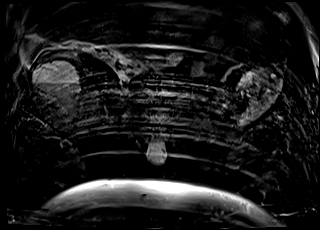
[im 42/48]
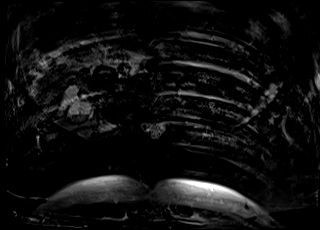
[im 48/48]
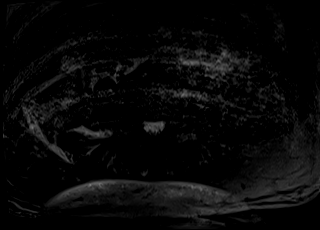

[Series 6: T2 fat-sat · sagittal · 5.0mm · 1.03mm/px · 8 of 43 slices shown (2 of 3)]
[im 1/43]
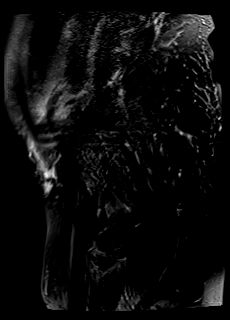
[im 7/43]
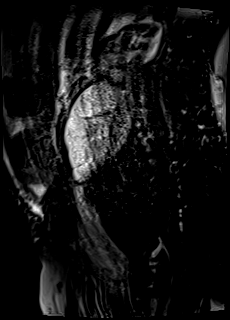
[im 13/43]
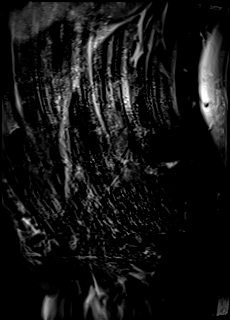
[im 19/43]
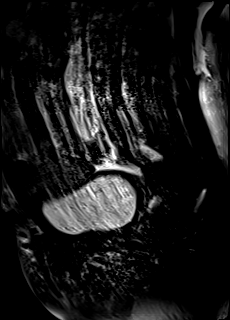
[im 25/43]
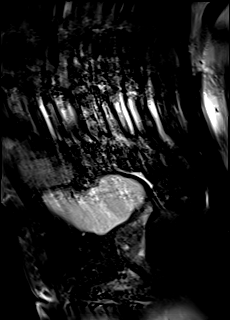
[im 31/43]
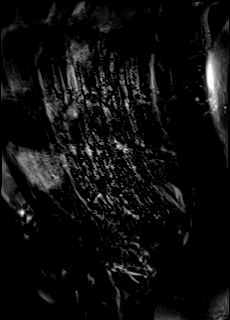
[im 37/43]
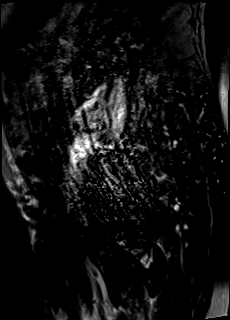
[im 43/43]
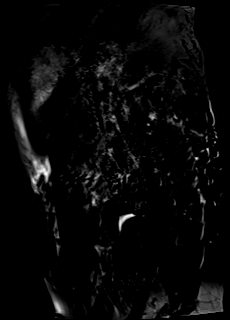

[Series 7: T2 fat-sat · axial · 4.0mm · 0.94mm/px · z∈[-166,+69]mm · 9 of 48 slices shown (3 of 3)]
[im 1/48]
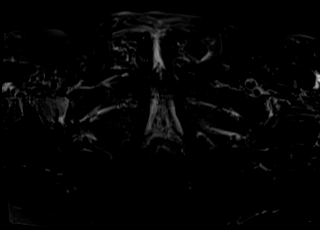
[im 6/48]
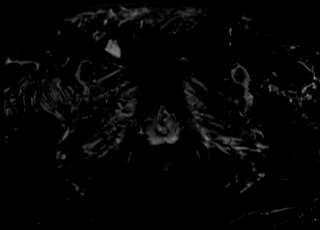
[im 12/48]
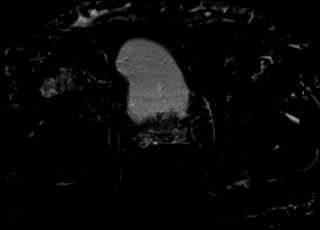
[im 18/48]
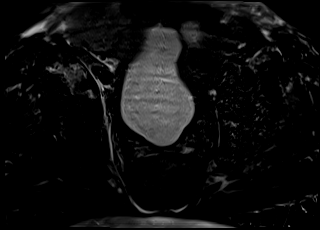
[im 24/48]
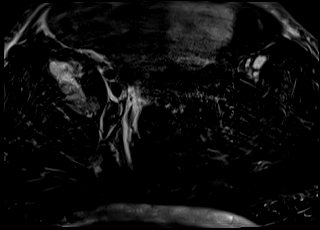
[im 30/48]
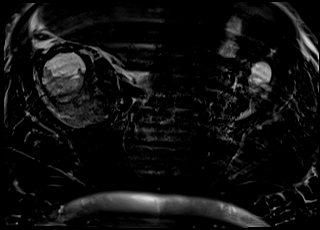
[im 36/48]
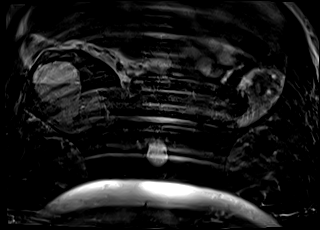
[im 42/48]
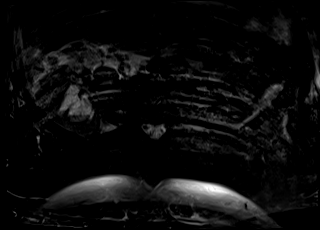
[im 48/48]
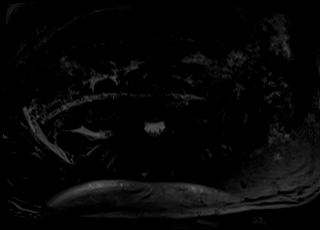

[48 of 48 positions shown; findings below may reference images not displayed]

FINDINGS: Despite efforts by the technologist and patient, motion artifact is
present on today's exam and could not be eliminated. This reduces
exam sensitivity and specificity.

OSSEOUS STRUCTURES AND JOINTS:

No fracture or compelling evidence of regional osseous metastatic
disease. No SI joint or hip joint effusion.

MUSCULAR STRUCTURES:

13.6 by 6.5 by 5.4 cm (volume = 250 cm^3) right iliacus muscle
hematoma with internal hematocrit levels, and associated edema
tracking in the iliacus muscle and in the surrounding
retroperitoneum and along the right pelvic sidewall.

There is also a 6.2 by 2.3 by 2.4 cm (volume = 18 cm^3) left
iliacus muscle hematoma with surrounding iliacus muscle edema.

Bilateral direct inguinal hernias are present. The left hernia
contains part of the proximal sigmoid colon, the right hernia
appears to contain a small amount of fluid and adipose tissue
currently although previously contained a loop of small bowel.

There is trace edema in a symmetric manner along the hip adductor
musculature.

Other: Prostate gland grossly unremarkable. Urinary bladder
unremarkable.
IMPRESSION: 1. Approximally 215 cc right iliacus muscle hematoma. 18 cc left
iliacus muscle hematoma. Both are associated with edema in the
iliacus muscles.
2. The right iliacus muscle hematoma is associated with edema
tracking in the surrounding retroperitoneum and along the right
pelvic sidewall and right lateral abdominal wall musculature.
3. Bilateral direct inguinal hernias. The left hernia contains part
of the colon in the right hernia contains adipose tissue and a small
amount of fluid.

## 2021-08-28 IMAGING — DX DG HIP (WITH OR WITHOUT PELVIS) 2-3V*R*
3 series · 3 of 3 positions shown · non-contrast
Comparison: CT [DATE]

CLINICAL DATA: Severe right-sided hip pain

EXAM:
DG HIP (WITH OR WITHOUT PELVIS) 2-3V RIGHT

[pelvis ap (1 of 2)]
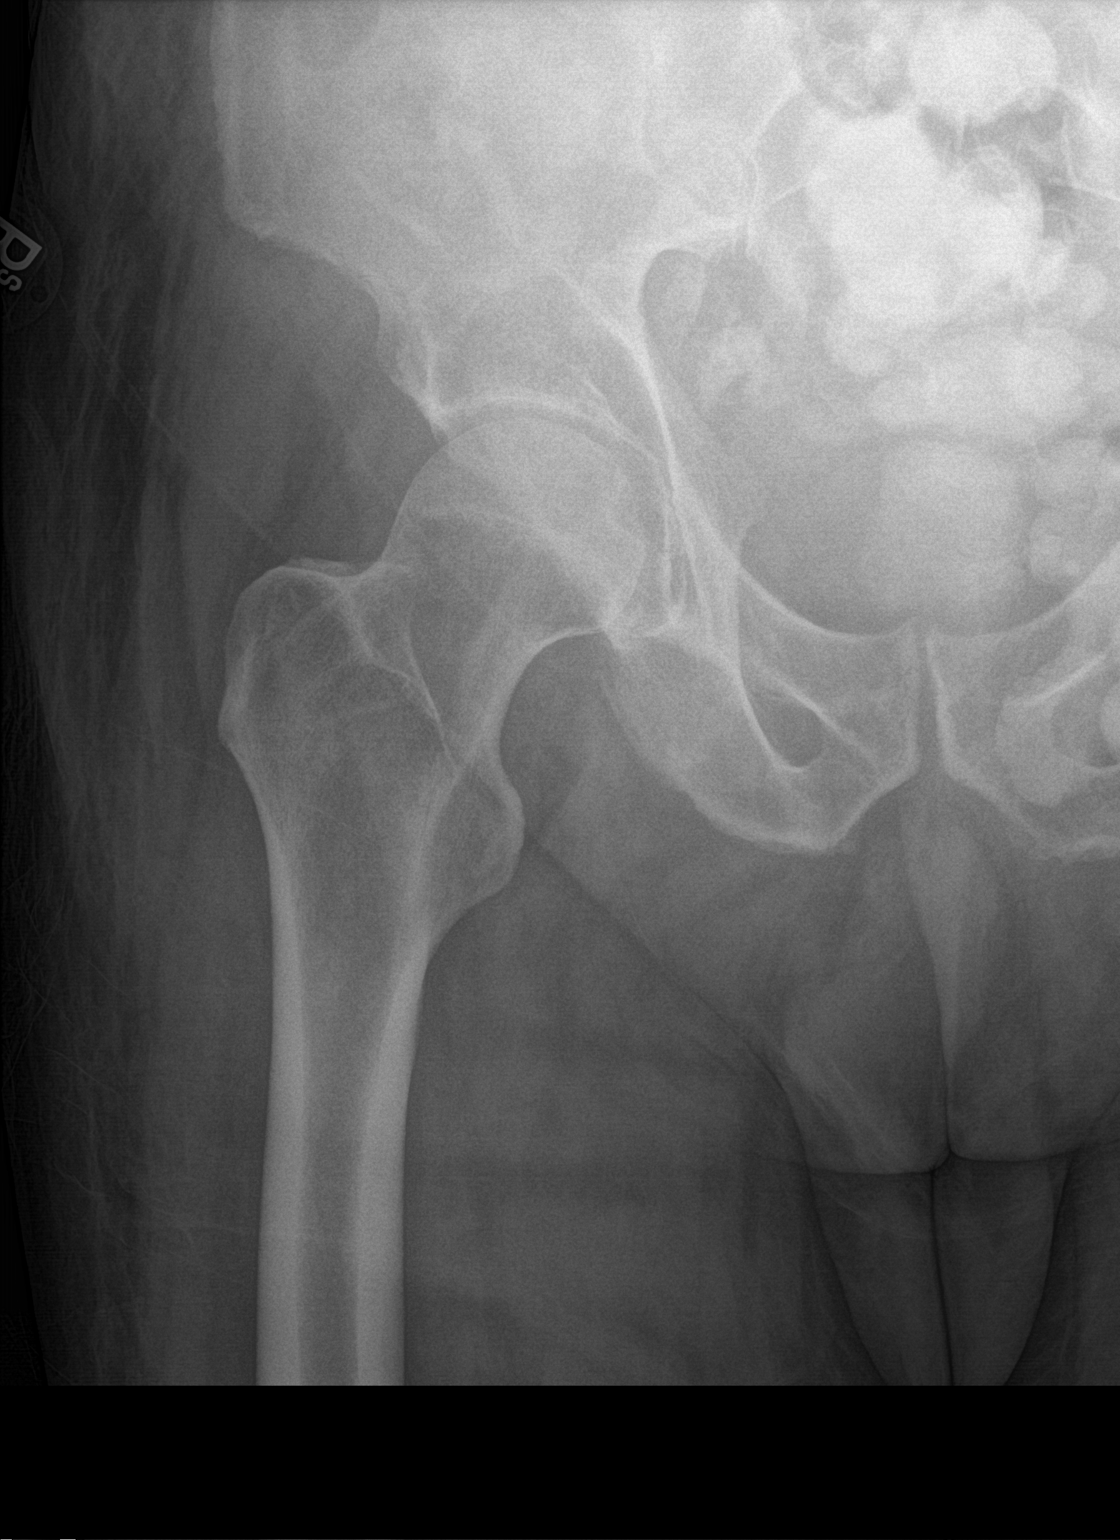

[hip lat]
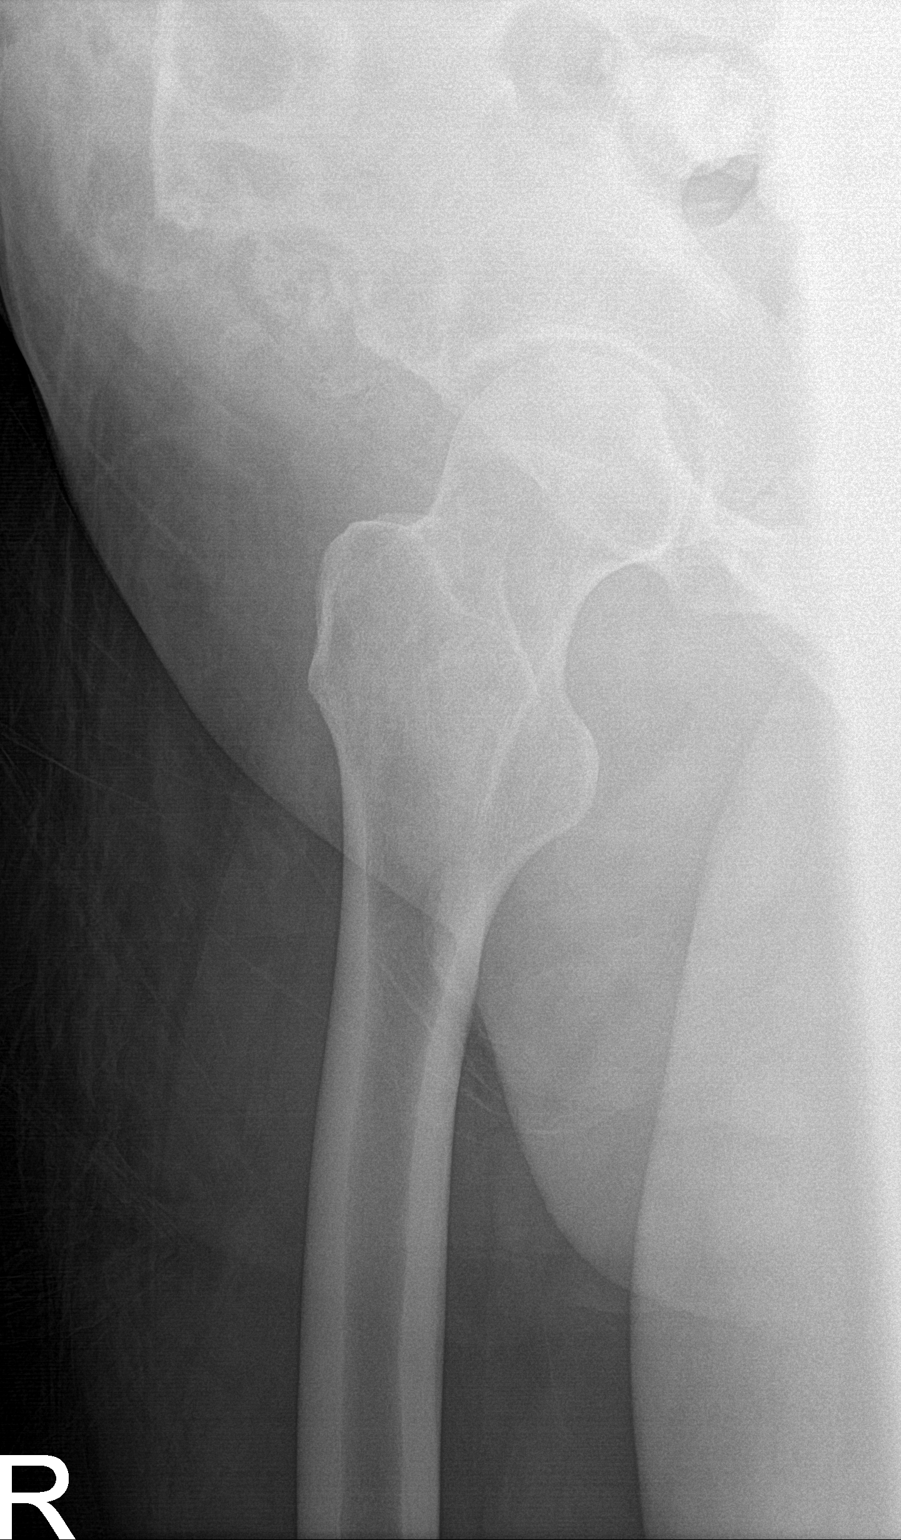

[pelvis ap (2 of 2)]
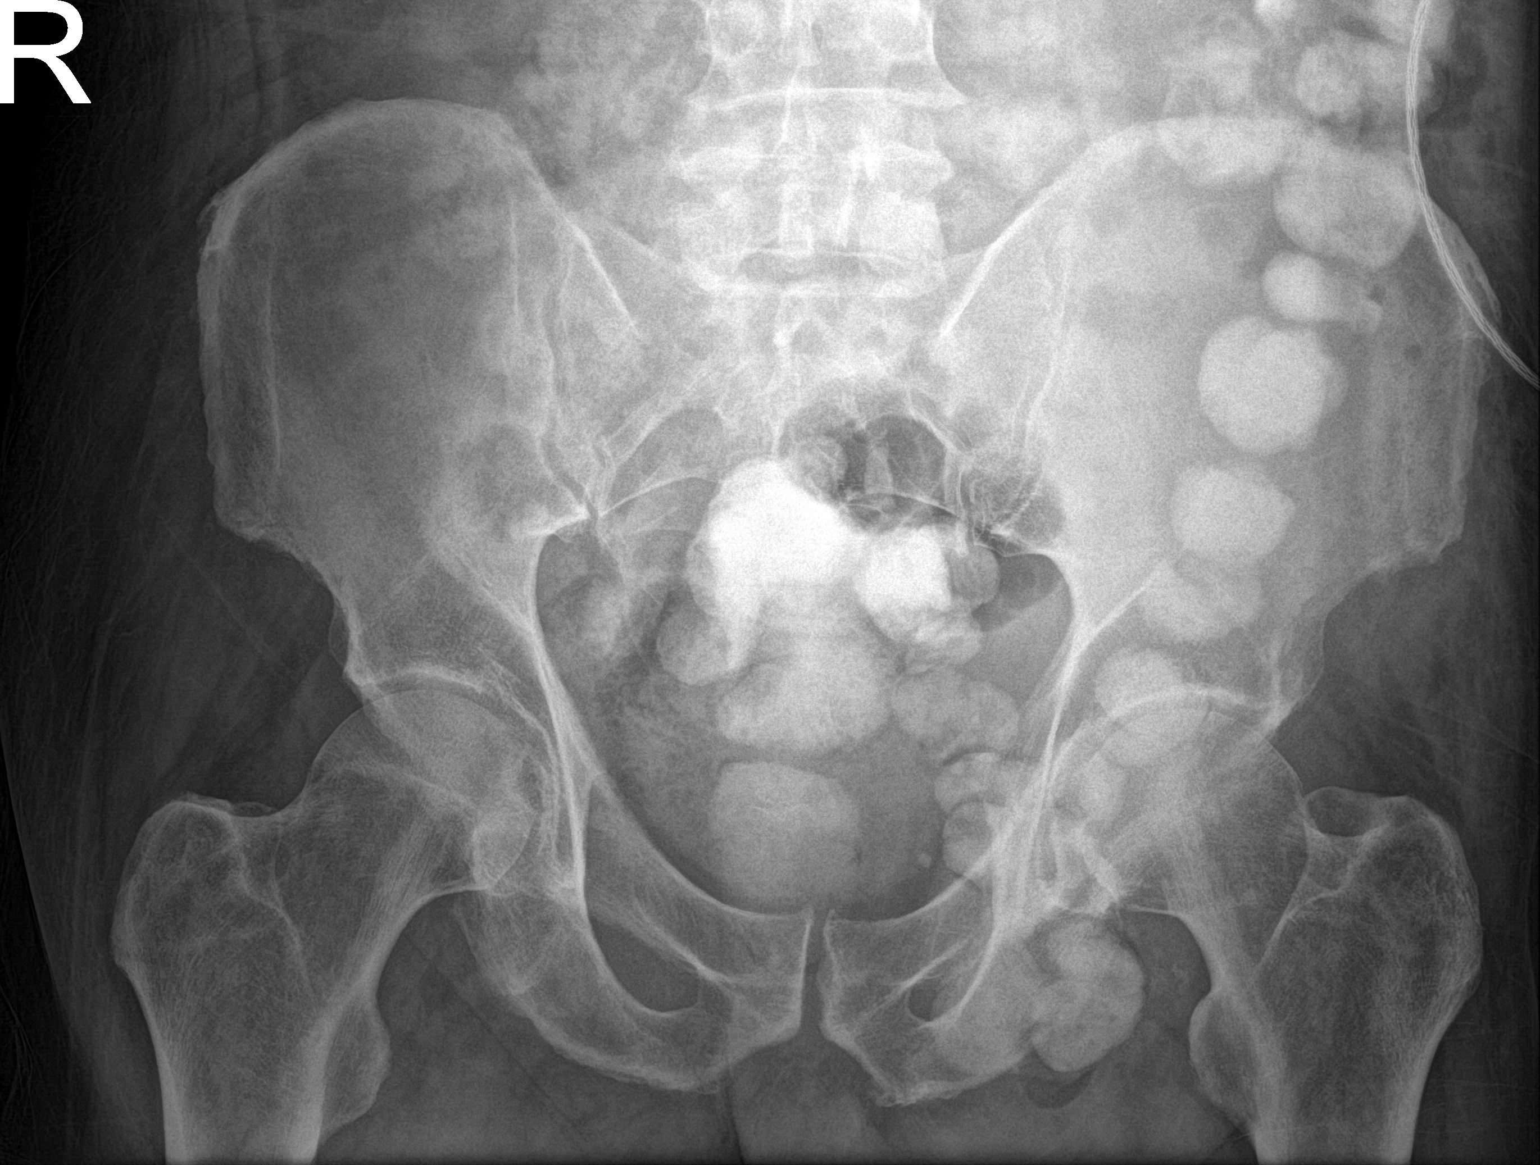

[3 of 3 positions shown; findings below may reference images not displayed]

FINDINGS: Residual enteral contrast in the colon and rectum. SI joints are non
widened. Pubic symphysis and rami appear intact. No definitive
fracture or malalignment.
IMPRESSION: No acute osseous abnormality

## 2021-08-28 IMAGING — MR MR LUMBAR SPINE W/O CM
4 of 5 series · 30 of 48 positions shown · non-contrast
Comparison: [DATE] CT scan

CLINICAL DATA: Worsening back pain and radiculopathy. History of
metastatic gastric cancer

EXAM:
MRI LUMBAR SPINE WITHOUT CONTRAST
TECHNIQUE: Multiplanar, multisequence MR imaging of the lumbar spine was
performed. No intravenous contrast was administered.

[Series 5: T1 · sagittal · 4.0mm · 0.81mm/px · 6 of 17 slices shown (1 of 2)]
[im 1/17]
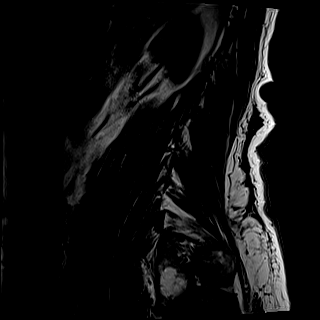
[im 4/17]
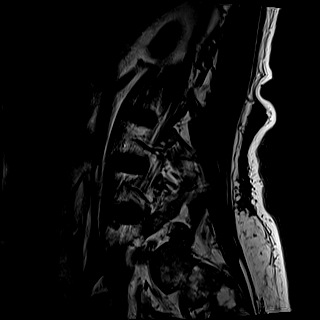
[im 7/17]
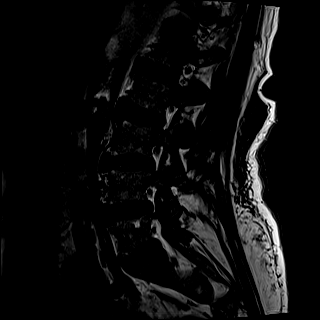
[im 10/17]
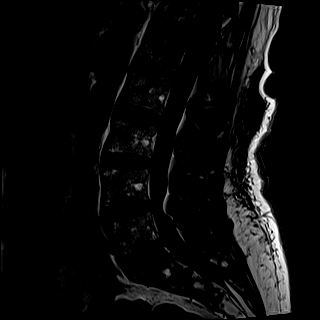
[im 13/17]
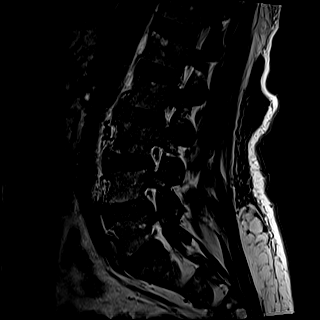
[im 17/17]
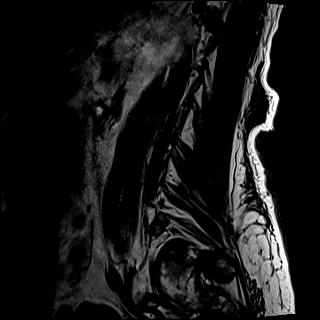

[Series 6: T2 · sagittal · 4.0mm · 0.81mm/px · 6 of 17 slices shown (1 of 2)]
[im 1/17]
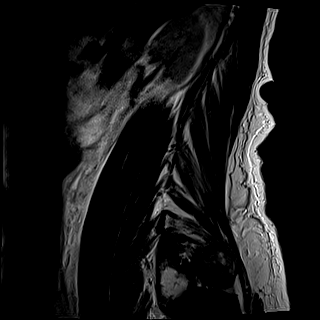
[im 4/17]
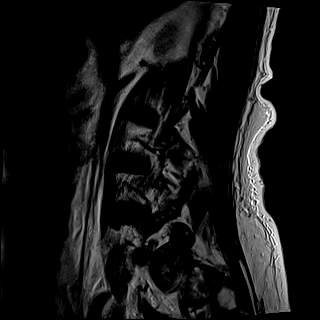
[im 7/17]
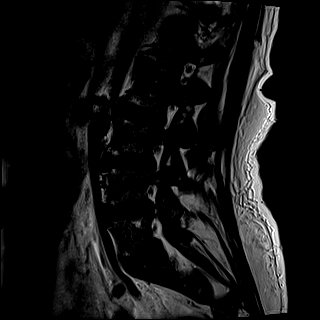
[im 10/17]
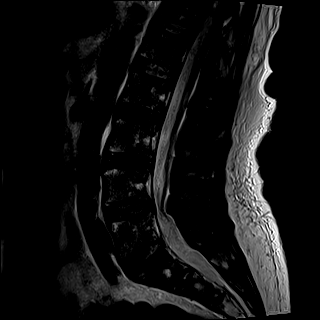
[im 13/17]
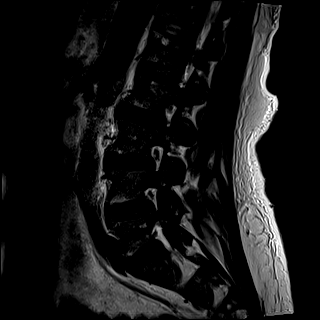
[im 17/17]
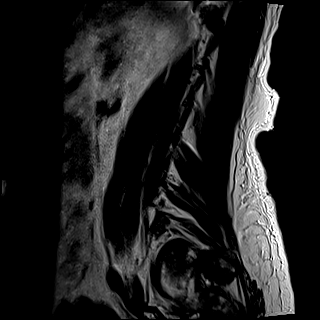

[Series 11: T1 · axial · 4.0mm · 0.39mm/px · z∈[-30,+189]mm · 9 of 43 slices shown (2 of 2)]
[im 1/43]
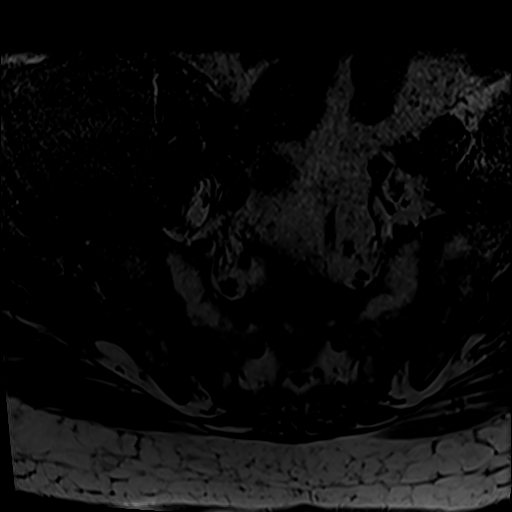
[im 7/43]
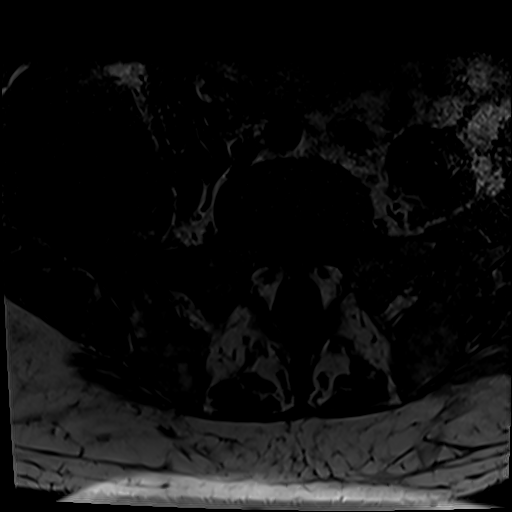
[im 13/43]
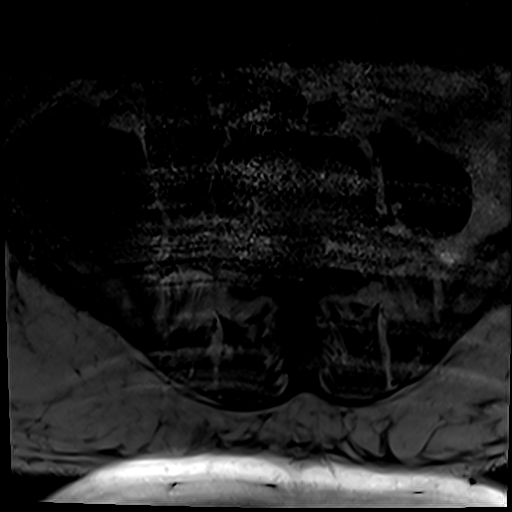
[im 19/43]
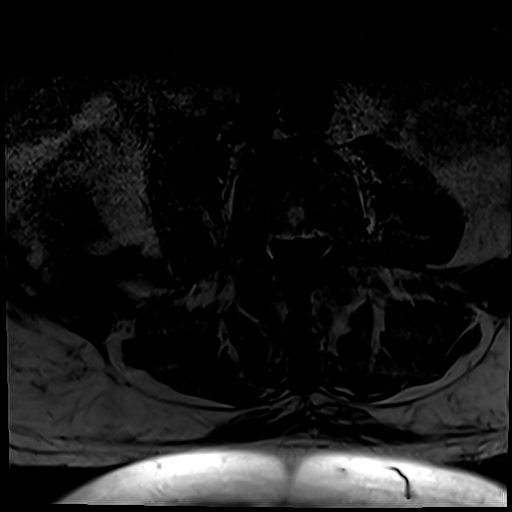
[im 22/43]
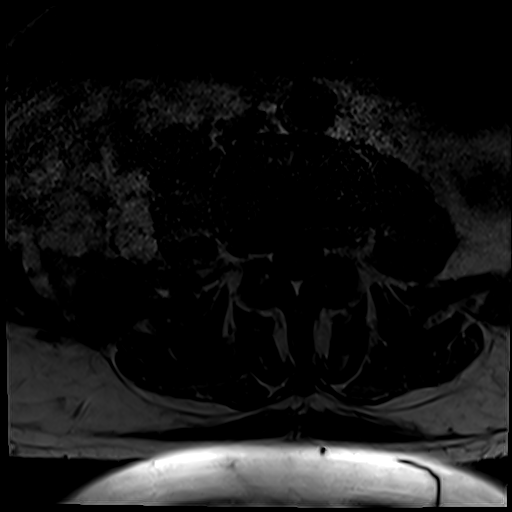
[im 25/43]
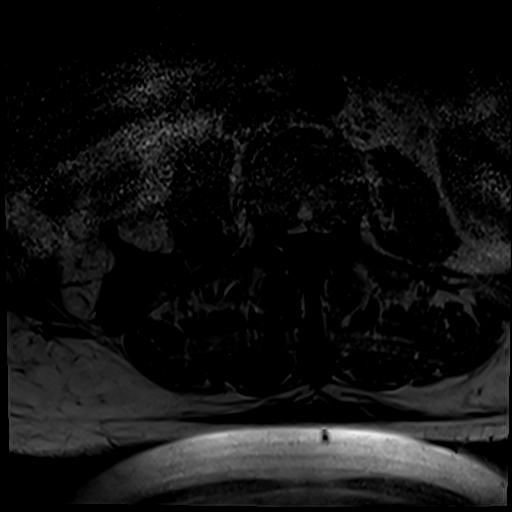
[im 31/43]
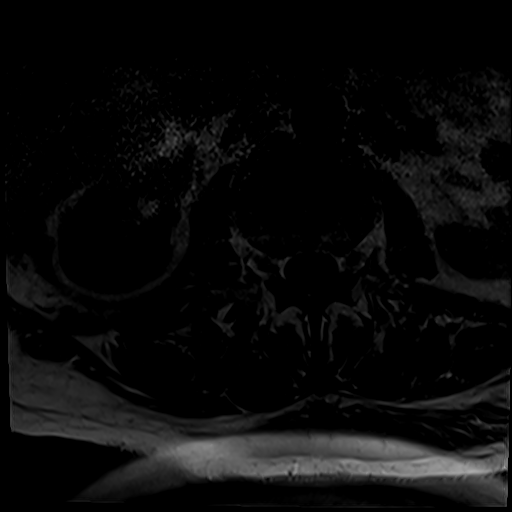
[im 37/43]
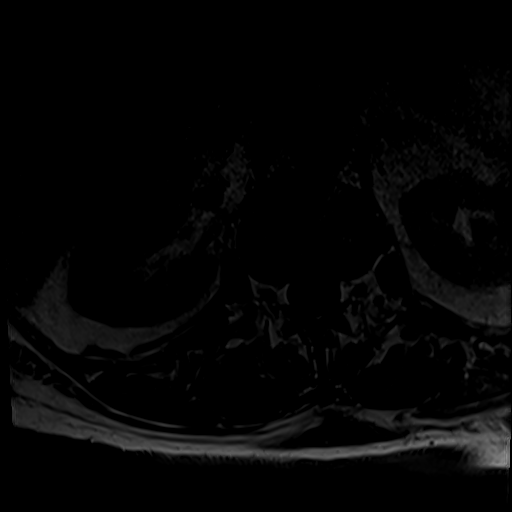
[im 43/43]
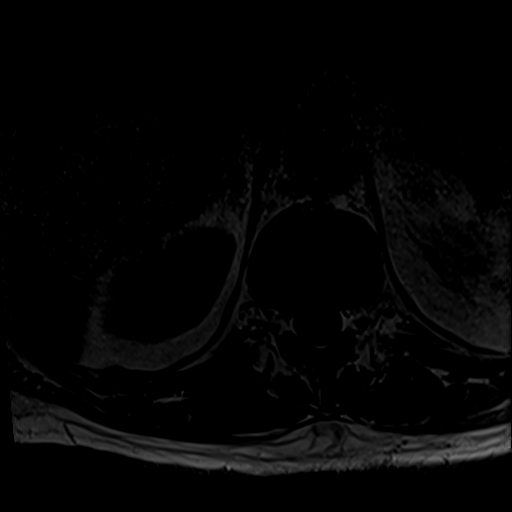

[Series 12: T2 · axial · 4.0mm · 0.62mm/px · z∈[-27,+198]mm · 9 of 43 slices shown (2 of 2)]
[im 1/43]
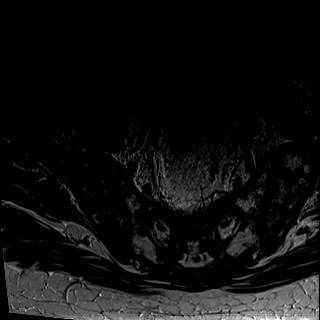
[im 7/43]
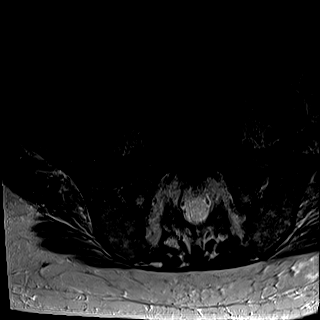
[im 13/43]
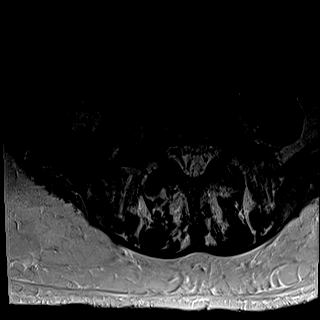
[im 19/43]
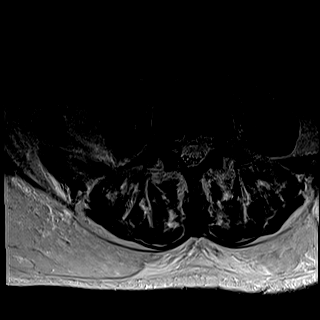
[im 22/43]
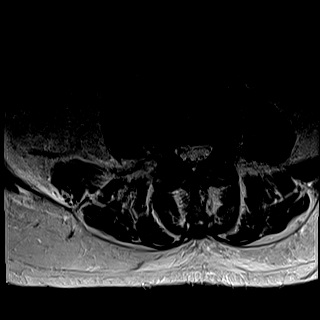
[im 25/43]
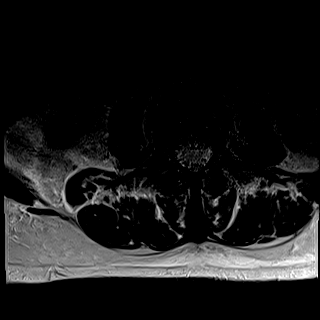
[im 31/43]
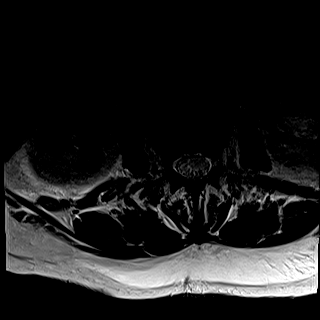
[im 37/43]
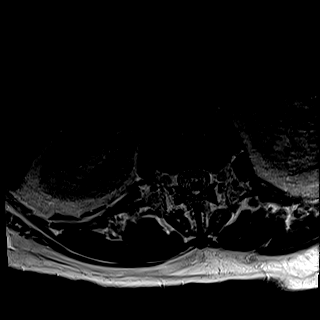
[im 43/43]
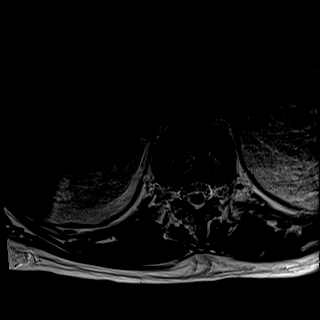

[30 of 48 positions shown; findings below may reference images not displayed]

FINDINGS: Despite efforts by the technologist and patient, motion artifact is
present on today's exam and could not be eliminated. This reduces
exam sensitivity and specificity.

Segmentation: The lowest lumbar type non-rib-bearing vertebra is
labeled as L5.

Alignment:  No vertebral subluxation is observed.

Vertebrae: Mild degenerative endplate findings. Several small
hemangiomas are observed. Faintly reduced T1 signal in the marrow
although not pathologically reduced, with marrow generally greater
intervertebral disc signal on the T1 weighted images.

Conus medullaris and cauda equina: Conus extends to the L1-2 level.
Conus and cauda equina appear normal.

Paraspinal and other soft tissues: There is a new large complex
fluid collection in the right iliacus muscle, with internal
fluid-fluid level superiorly on image 28 series 12, and a larger
inferior component measuring at least 6 cm in diameter and likely
substantially larger. This tracks beyond the inferior-most images of
today's exam and is associated with abnormal infiltrative edema in
the left iliacus muscle. Given that there was no abnormality in this
vicinity on [DATE], and given the potential internal hematocrit
levels, I tend to favor acute hematoma over iliacus muscle abscess.
There is some associated edema tracking along the right
retroperitoneum.

The patient has known pathologic retroperitoneal adenopathy as on
recent CT scan.

Disc levels:

L1-2: Unremarkable.

L2-3: No impingement.  Mild disc bulge.

L3-4: No impingement.  Mild disc bulge.

L4-5: Moderate central narrowing of the thecal sac due to disc bulge
and ligamentum flavum redundancy.

L5-S1: No impingement.  Bilateral degenerative facet arthropathy.
IMPRESSION: 1. Large new complex right iliacus muscle fluid collection, rapid
onset favoring hematoma over abscess. There is associated edema in
the iliacus muscle and along the adjacent right retroperitoneum.
2. Moderate central narrowing of the thecal sac at L4-5 due to disc
bulge and ligamentum flavum redundancy.
3. Retroperitoneal adenopathy as shown on recent CT.

## 2021-08-28 MED ORDER — PANTOPRAZOLE SODIUM 40 MG PO TBEC
40.0000 mg | DELAYED_RELEASE_TABLET | Freq: Two times a day (BID) | ORAL | Status: DC
Start: 2021-08-29 — End: 2021-08-28

## 2021-08-28 MED ORDER — CYCLOBENZAPRINE HCL 10 MG PO TABS
10.0000 mg | ORAL_TABLET | Freq: Four times a day (QID) | ORAL | Status: DC | PRN
  Administered 2021-08-28: 10 mg via ORAL
  Filled 2021-08-28: qty 1

## 2021-08-28 MED ORDER — SODIUM CHLORIDE 0.9 % IV SOLN
10.0000 mg | Freq: Once | INTRAVENOUS | Status: AC
  Administered 2021-08-28: 10 mg via INTRAVENOUS
  Filled 2021-08-28: qty 1

## 2021-08-28 MED ORDER — MORPHINE SULFATE (PF) 4 MG/ML IV SOLN
5.0000 mg | INTRAVENOUS | Status: DC | PRN
  Administered 2021-08-28 – 2021-08-29 (×3): 10 mg via INTRAVENOUS
  Administered 2021-08-29 – 2021-08-30 (×3): 8 mg via INTRAVENOUS
  Filled 2021-08-28 (×2): qty 2
  Filled 2021-08-28 (×3): qty 3
  Filled 2021-08-28: qty 2

## 2021-08-28 MED ORDER — DEXAMETHASONE SODIUM PHOSPHATE 10 MG/ML IJ SOLN
10.0000 mg | Freq: Four times a day (QID) | INTRAMUSCULAR | Status: DC
  Administered 2021-08-28 – 2021-08-29 (×3): 10 mg via INTRAVENOUS
  Filled 2021-08-28 (×3): qty 1

## 2021-08-28 MED ORDER — SODIUM CHLORIDE 0.9 % IV SOLN
2.0000 g | Freq: Three times a day (TID) | INTRAVENOUS | Status: DC
  Administered 2021-08-28 – 2021-08-29 (×3): 2 g via INTRAVENOUS
  Filled 2021-08-28 (×4): qty 2

## 2021-08-28 MED ORDER — MORPHINE BOLUS VIA INFUSION
5.0000 mg | INTRAVENOUS | Status: DC | PRN
  Filled 2021-08-28: qty 10

## 2021-08-28 MED ORDER — MORPHINE SULFATE (PF) 4 MG/ML IV SOLN
INTRAVENOUS | Status: AC
  Filled 2021-08-28: qty 1

## 2021-08-28 MED ORDER — PANTOPRAZOLE SODIUM 40 MG PO TBEC
40.0000 mg | DELAYED_RELEASE_TABLET | Freq: Two times a day (BID) | ORAL | Status: DC
  Administered 2021-08-28 – 2021-09-03 (×11): 40 mg via ORAL
  Filled 2021-08-28 (×11): qty 1

## 2021-08-28 MED ORDER — MUSCLE RUB 10-15 % EX CREA
TOPICAL_CREAM | CUTANEOUS | Status: DC | PRN
  Filled 2021-08-28: qty 85

## 2021-08-28 MED ORDER — HEPARIN (PORCINE) 25000 UT/250ML-% IV SOLN
1300.0000 [IU]/h | INTRAVENOUS | Status: DC
  Filled 2021-08-28: qty 250

## 2021-08-28 MED ORDER — OXYCODONE HCL 5 MG PO TABS
5.0000 mg | ORAL_TABLET | ORAL | Status: DC | PRN
  Administered 2021-08-28 – 2021-08-30 (×3): 10 mg via ORAL
  Filled 2021-08-28 (×3): qty 2

## 2021-08-28 NOTE — Progress Notes (Signed)
Discussed findings of MRI showing large R hematoma involving ileopsoas that was not present on admit.  Cbc is pending.  Venous dopplers ordered.  If having going evidence of hemorrhage can treat with protamine or ask IR to try to embolize the bleed  Having IR decompress the hematoma would prevent the hematoma from tampoding the source of the bleeding per radiology and pt is not an operative candidate at this point.  Reviewed limited options with pt, fm, nursing.  Total time CCM time addressing this problem this afternoon = 60 min  Christinia Gully, MD Pulmonary and [place] [phone]   After 7:00 pm call Elink  [phone]

## 2021-08-28 NOTE — Progress Notes (Signed)
1353 Patient complaint of right leg pain,states it feels like muscle spasm. Dr Melvyn Novas is aware new orders noted.   1400 Patient reports that this pain started on 08/27/21 and he felt as if it was because he could not get comfortable and that he was having muscle spasms.   1600 PRN medications given for pain with no relief. Patient waxing and waining, unable to get comfortable. Notified Dr. Melvyn Novas new orders noted.   38 Dr Melvyn Novas at bedside, order for MRI   [place] Back to RM 1227, Heparin stopped at this time.   44 Dr Melvyn Novas spoke with family regarding results of MRI

## 2021-08-28 NOTE — Progress Notes (Signed)
ANTICOAGULATION CONSULT NOTE - Follow Up Consult  Pharmacy Consult for heparin Indication:  acute pulmonary embolus  Allergies  Allergen Reactions   Ofloxacin     REACTION: rash    Patient Measurements: Height: 6\' 1"  (185.4 cm) Weight: 83.6 kg (184 lb 4.8 oz) IBW/kg (Calculated) : 79.9 Heparin Dosing Weight: 84 kg  Vital Signs: Temp: 98.3 F (36.8 C) (01/22 0000) Temp Source: Oral (01/22 0000) BP: 106/58 (01/22 0400) Pulse Rate: 66 (01/22 0400)  Labs: Recent Labs    08/25/21 1910 08/25/21 2130 08/26/21 0308 08/26/21 0518 08/26/21 5631 08/26/21 0806 08/26/21 1135 08/26/21 2100 08/27/21 0818 08/27/21 0905 08/27/21 2100 08/28/21 0500 08/28/21 0510  HGB 10.5*  --  9.0*  --  10.8*  --  9.4*  --   --  9.9*  --  8.7*  --   HCT 32.6*  --  28.5*  --  34.2*  --  29.7*  --   --  31.0*  --  27.5*  --   PLT 407*  --  319  --  388  --  305  --   --  317  --  274  --   APTT  --   --   --   --   --   --  62*  --   --   --   --   --   --   LABPROT 14.4  --   --   --   --   --  17.8*  --   --   --   --   --   --   INR 1.1  --   --   --   --   --  1.5*  --   --   --   --   --   --   HEPARINUNFRC  --   --   --   --   --  0.28*  --    < > 0.84*  --  0.25*  --  0.34  CREATININE 1.06  --  1.17  --   --   --   --   --   --  1.02  --   --   --   TROPONINIHS 62*   < > 160* 179* 191* 209*  --   --   --   --   --   --   --    < > = values in this interval not displayed.     Estimated Creatinine Clearance: 65.3 mL/min (by C-G formula based on SCr of 1.02 mg/dL).   Assessment: Billy [name] is a 81 y.o. 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  Chest CTA came back with acute PE with evidence of right heart strain. He was started on heparin on admission.  Peripheral tPA 50mg  IV x1 given on 1/20. Heparin drip resumed  after administration of tPA.  Today, 08/28/2021: - heparin level is 0.34- now therapeutic on heparin drip infusing at 1250 units/hr -RN reports no infusion issues and no bleeding noted - CBC: Hg low/decreased to 8.7, pltc WNL  Goal of Therapy:  Heparin level 0.3-0.5 units/ml for 24 hrs after tPA, then 0.3-0.7 starting at noon on 1/21 Monitor platelets by anticoagulation protocol: Yes   Plan:  - Continue heparin drip at 1250 units/hr  - check  8 hr heparin level  - monitor for s/sx bleeding  [name] PharmD 08/28/2021,5:42 AM

## 2021-08-28 NOTE — Progress Notes (Signed)
Pt described a "cramp" in his R groin on waking this am that has worsened during the day and now c/o pain shooting down his L leg to this R foot with numbness and thigh with Pos SLR testing but nl R Hip motion  Discussed with ortho on call  c/w radicular pain but my concern based on how abrupt it occurred may be a hematoma or bled into tumor p thrombolytics so rec   Stat cbc Plain R hip xray Mri spine/ pelvis rec by ortho Dex x 10 mg IV  MS up to 10 mg q 1 h/ note NCB setting.   Discussed with wife and daughter at bedside limits of what we can offer in terms of dx and intervention in this setting.  Christinia Gully, MD Pulmonary and [place] [phone]   After 7:00 pm call Elink  [phone]

## 2021-08-28 NOTE — Progress Notes (Signed)
ANTICOAGULATION CONSULT NOTE - Follow Up Consult  Pharmacy Consult for heparin Indication:  acute pulmonary embolus  Allergies  Allergen Reactions   Ofloxacin     REACTION: rash    Patient Measurements: Height: 6\' 1"  (185.4 cm) Weight: 83.6 kg (184 lb 4.8 oz) IBW/kg (Calculated) : 79.9 Heparin Dosing Weight: 84 kg  Vital Signs: Temp: 98.3 F (36.8 C) (01/22 1200) Temp Source: Oral (01/22 1200) BP: 104/63 (01/22 0700) Pulse Rate: 73 (01/22 1100)  Labs: Recent Labs    08/25/21 1910 08/25/21 2130 08/26/21 0308 08/26/21 0518 08/26/21 0648 08/26/21 0806 08/26/21 1135 08/26/21 2100 08/27/21 0905 08/27/21 2100 08/28/21 0500 08/28/21 0510 08/28/21 1400  HGB 10.5*  --  9.0*  --  10.8*  --  9.4*  --  9.9*  --  8.7*  --   --   HCT 32.6*  --  28.5*  --  34.2*  --  29.7*  --  31.0*  --  27.5*  --   --   PLT 407*  --  319  --  388  --  305  --  317  --  274  --   --   APTT  --   --   --   --   --   --  62*  --   --   --   --   --   --   LABPROT 14.4  --   --   --   --   --  17.8*  --   --   --   --   --   --   INR 1.1  --   --   --   --   --  1.5*  --   --   --   --   --   --   HEPARINUNFRC  --   --   --   --   --  0.28*  --    < >  --  0.25*  --  0.34 0.29*  CREATININE 1.06  --  1.17  --   --   --   --   --  1.02  --   --   --   --   TROPONINIHS 62*   < > 160* 179* 191* 209*  --   --   --   --   --   --   --    < > = values in this interval not displayed.     Estimated Creatinine Clearance: 65.3 mL/min (by C-G formula based on SCr of 1.02 mg/dL).   Assessment: Billy [name] is a 81 y.o. 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  Chest CTA came back with acute PE with evidence of right heart strain. He was started on heparin on admission.  Peripheral tPA 50mg  IV x1 given on 1/20. Heparin drip resumed  after administration of tPA.  Today, 08/28/2021: - heparin level is 0.29 - just below goal heparin drip infusing at 1250 units/hr - RN reports no infusion issues and no bleeding noted - CBC: Hg low/decreased to 8.7, pltc WNL  Goal of Therapy:  Heparin level 0.3-0.5 units/ml for 24 hrs after tPA, then 0.3-0.7 starting at noon on 1/21 Monitor platelets by anticoagulation protocol: Yes   Plan:  - Increase heparin drip to 1300 units/hr  -  check 8 hr heparin level  - monitor for s/sx bleeding  Peggyann Juba, PharmD, BCPS Pharmacy: [phone] 08/28/2021,3:25 PM

## 2021-08-28 NOTE — Progress Notes (Signed)
NAME:  Billy [name], MRN:  [id number], DOB:  07-04-1941, LOS: 2 ADMISSION DATE:  08/25/2021, CONSULTATION DATE:  08/26/21 REFERRING MD:  Verlon Au - [facility], CHIEF COMPLAINT:  PE   History of Present Illness:  81 yo M formerly very active [place] who recently was diagnosed with incurable stg IV gastric adenocarcinoma, currently on palliative chemo (FOLFOX, trastuzumab + pembrolizumab, with plan to switch FOLFOX to capox after 2 cycles) , who presented to [facility] ED 1/19 with SOB and fever T 102. He was started on braod abx for possible sepsis in immunocomp host, and was sent for CTA chest which revealed R subsegmental PE and was concerning for possible RV strain with RV:LV 1.4  Of note, in review of outpt oncology note there is reference to a possible hypersensitivity reaction to pembrolizumab and trastuzumab   Started on hep gtt and admitted to [facility]. Initially hypotensive but after 3.5 L IVF this improved. On 1/20 pt had a near syncopal event when he attempted to to stand to urinate. He then exhibited worsening SOB and hypoxia and was placed on BiPAP.   Trops initially were 62 but these continue to trend upward, now 209. BNP is elevated to 493, LA 3 --suggestive of at least submassive PE  PCCM consulted in this setting   Pertinent  Medical History  Stg IV gastric adenocarcinoma   Significant Hospital Events: Including procedures, antibiotic start and stop dates in addition to other pertinent events   1/19 ED for SOB. R subsegmental PE with possible RV strain, hypoxia, hypotension. [facility] admit. Heparin gtt + broad abx for fever 1/20 Near syncopal event, worse hypoxia, recurrent hypotension. PCCM consulted>>> Rx TPA 1/21 weaned off levophed  Scheduled Meds:  atorvastatin  80 mg Oral q1800   Chlorhexidine Gluconate Cloth  6 each Topical Q0600   docusate sodium  100 mg Oral BID   feeding supplement  1 Container Oral Q24H   feeding supplement  237 mL Oral BID BM   mirtazapine  7.5 mg  Oral QHS   morphine  15 mg Oral Q12H   multivitamin with minerals  1 tablet Oral Daily   polyethylene glycol  17 g Oral Daily   senna  1 tablet Oral BID   sodium chloride flush  10-40 mL Intracatheter Q12H   Continuous Infusions:  sodium chloride 250 mL (08/26/21 1152)   ceFEPime (MAXIPIME) IV Stopped (08/28/21 0348)   heparin 1,250 Units/hr (08/28/21 0600)   PRN Meds:.acetaminophen **OR** acetaminophen, ALPRAZolam, diphenhydrAMINE, lip balm, ondansetron, oxyCODONE, sodium chloride flush    Interim History / Subjective:  No cp, no sob off 02 and bp ok off levophed   Objective   Blood pressure (!) 147/86, pulse 67, temperature 97.8 F (36.6 C), temperature source Oral, resp. rate (!) 24, height 6\' 1"  (1.854 m), weight 83.6 kg, SpO2 93 %.        Intake/Output Summary (Last 24 hours) at 08/28/2021 0816 Last data filed at 08/28/2021 0600 Gross per 24 hour  Intake 3573.14 ml  Output 1475 ml  Net 2098.14 ml   Filed Weights   08/25/21 2211  Weight: 83.6 kg    Examination: Tmax   98.3 General appearance:    restless wm wants to get up in chair/otherwised nad  At Rest 02 sats  100% on RA  No jvd Oropharynx clear,  mucosa nl Neck supple Lungs with clear  bilaterally RRR no s3 or or sign murmur Abd soft with nl  excursion  Extr warm with  no edema or clubbing noted Neuro  Sensorium intact but anxious,  no apparent motor deficits   Resolved Hospital Problem list     Assessment & Plan:   Massive PE ? Shock due to PE vs sepsis vs drug reaction  -R subsegmental clots, but with significant hemodynamic compromise.  S/p TPA  1/20 improved am 1/21 and weaned off levophed >>> transfer to floor       Stg IV metastatic gastric adenocarcinoma  -on palliative chemo outpt  P Leave on hep thru weekend with ? Hypercoagulable state ? Change to LMWH ? > will need oncology input    Fever improved 1/21 Maculopapular rash Leukocytosis -- suspected drug fever vs related to PE.  Doubt infectious. No infiltrates on CT, urine looks ok. Port site without signs of infection  -outpt noted to have rxn to immunologic therapy. Presented with existing maculopapular rash  which raise question of drug fever -was started empirically on abx for possible sepsis P -d/c'd vanc 1/22 with neg cultures and MRSA screen neg  -d/c'd solumedrol 1/22 and watch fever curve     GOC DNR Status        Best Practice (right click and "Reselect all SmartList Selections" daily)   Diet/type: Regular consistency (see orders) DVT prophylaxis: systemic heparin GI prophylaxis: N/A Lines: yes and it is still needed -- port Foley:  N/A Code Status:  DNR Last date of multidisciplinary goals of care discussion [1/20] D/w pt, wife and daughter   Labs   CBC: Recent Labs  Lab 08/25/21 0844 08/25/21 1910 08/26/21 0308 08/26/21 0648 08/26/21 1135 08/27/21 0905 08/28/21 0500  WBC 12.2* 10.5 16.0* 19.6* 16.8* 15.6* 11.5*  NEUTROABS 8.4* 9.9*  --   --   --   --   --   HGB 10.4* 10.5* 9.0* 10.8* 9.4* 9.9* 8.7*  HCT 31.5* 32.6* 28.5* 34.2* 29.7* 31.0* 27.5*  MCV 92.9 94.5 96.0 96.3 95.5 95.7 95.8  PLT 358 407* 319 388 305 317 734    Basic Metabolic Panel: Recent Labs  Lab 08/25/21 0844 08/25/21 1910 08/26/21 0308 08/27/21 0905  NA 136 135 133* 133*  K 4.2 4.3 4.7 4.0  CL 103 104 106 106  CO2 25 21* 20* 20*  GLUCOSE 111* 113* 125* 128*  BUN 18 22 24* 35*  CREATININE 0.98 1.06 1.17 1.02  CALCIUM 8.8* 8.5* 7.4* 7.7*   GFR: Estimated Creatinine Clearance: 65.3 mL/min (by C-G formula based on SCr of 1.02 mg/dL). Recent Labs  Lab 08/25/21 1748 08/25/21 1910 08/25/21 2130 08/26/21 0308 08/26/21 0648 08/26/21 1135 08/27/21 0905 08/28/21 0500  WBC  --    < >  --    < > 19.6* 16.8* 15.6* 11.5*  LATICACIDVEN 2.2*  --  1.7  --  3.0* 1.4  --   --    < > = values in this interval not displayed.    Liver Function Tests: Recent Labs  Lab 08/25/21 0844 08/25/21 1910  AST 88*  116*  ALT 59* 71*  ALKPHOS 415* 511*  BILITOT 0.7 0.6  PROT 6.3* 6.1*  ALBUMIN 3.2* 2.7*   No results for input(s): LIPASE, AMYLASE in the last 168 hours. No results for input(s): AMMONIA in the last 168 hours.  ABG    Component Value Date/Time   PHART 7.296 (L) 08/26/2021 0641   PCO2ART 33.9 08/26/2021 0641   PO2ART 423 (H) 08/26/2021 0641   HCO3 16.0 (L) 08/26/2021 0641   ACIDBASEDEF 9.1 (H) 08/26/2021 1937  O2SAT 99.7 08/26/2021 0641     Coagulation Profile: Recent Labs  Lab 08/25/21 1910 08/26/21 1135  INR 1.1 1.5*    Cardiac Enzymes: No results for input(s): CKTOTAL, CKMB, CKMBINDEX, TROPONINI in the last 168 hours.  HbA1C: Hgb A1c MFr Bld  Date/Time Value Ref Range Status  07/28/2020 10:51 AM 5.7 4.6 - 6.5 % Final    Comment:    Glycemic Control Guidelines for People with Diabetes:Non Diabetic:  <6%Goal of Therapy: <7%Additional Action Suggested:  >8%   07/25/2019 10:37 AM 5.6 4.6 - 6.5 % Final    Comment:    Glycemic Control Guidelines for People with Diabetes:Non Diabetic:  <6%Goal of Therapy: <7%Additional Action Suggested:  >8%     CBG: No results for input(s): GLUCAP in the last 168 hours.  Christinia Gully, MD Pulmonary and [place] [phone]   After 7:00 pm call Elink  [phone]

## 2021-08-28 NOTE — Progress Notes (Signed)
Pharmacy Antibiotic Note  Billy [name] is a 81 y.o. 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  Chest CTA came back with acute PE with evidence of right heart strain.  He was started on vancomycin and cefepime on admission for fever. Vancomycin d/ced on 1/22 per MD.  Today, 08/28/2021: - day #3 abx - afeb - WBC down 11.5, ANC 9.9 - scr 1.02 on 1/21 (crcl~65) - all cultures have been negative thus far  Plan: - adjust cefepime to 2gm q8h ____________________________________________  Height: 6\' 1"  (185.4 cm) Weight: 83.6 kg (184 lb 4.8 oz) IBW/kg (Calculated) : 79.9  Temp (24hrs), Avg:97.8 F (36.6 C), Min:97.3 F (36.3 C), Max:98.3 F (36.8 C)  Recent Labs  Lab 08/25/21 0844 08/25/21 1748 08/25/21 1910 08/25/21 2130 08/26/21 0308 08/26/21 0648 08/26/21 1135 08/27/21 0905 08/28/21 0500  WBC 12.2*  --  10.5  --  16.0* 19.6* 16.8* 15.6* 11.5*  CREATININE 0.98  --  1.06  --  1.17  --   --  1.02  --   LATICACIDVEN  --  2.2*  --  1.7  --  3.0* 1.4  --   --      Estimated Creatinine Clearance: 65.3 mL/min (by C-G formula based on SCr of 1.02 mg/dL).    Allergies  Allergen Reactions   Ofloxacin     REACTION: rash   1/19 Cefepime>> 1/19 vanc>>1/22  1/19 bcx x2:  1/20 MRSA pcr: neg  Thank you for allowing pharmacy to be a part of this patients care.  Lynelle Doctor 08/28/2021 8:50 AM

## 2021-08-29 ENCOUNTER — Inpatient Hospital Stay (HOSPITAL_COMMUNITY): Payer: Medicare HMO

## 2021-08-29 ENCOUNTER — Telehealth: Payer: Self-pay | Admitting: Hematology

## 2021-08-29 DIAGNOSIS — I2699 Other pulmonary embolism without acute cor pulmonale: Secondary | ICD-10-CM | POA: Diagnosis not present

## 2021-08-29 DIAGNOSIS — M79669 Pain in unspecified lower leg: Secondary | ICD-10-CM

## 2021-08-29 HISTORY — PX: IR IVC FILTER PLMT / S&I /IMG GUID/MOD SED: IMG701

## 2021-08-29 LAB — CBC
HCT: 26.1 % — ABNORMAL LOW (ref 39.0–52.0)
Hemoglobin: 8.2 g/dL — ABNORMAL LOW (ref 13.0–17.0)
MCH: 30.1 pg (ref 26.0–34.0)
MCHC: 31.4 g/dL (ref 30.0–36.0)
MCV: 96 fL (ref 80.0–100.0)
Platelets: 256 10*3/uL (ref 150–400)
RBC: 2.72 MIL/uL — ABNORMAL LOW (ref 4.22–5.81)
RDW: 14.3 % (ref 11.5–15.5)
WBC: 5.6 10*3/uL (ref 4.0–10.5)
nRBC: 0 % (ref 0.0–0.2)

## 2021-08-29 IMAGING — XA IR IVC FILTER PLMT / S&I /IMG GUID/MOD SED
2 series · 5 of 5 positions shown · non-contrast
Comparison: none

INDICATION: 80-year-old gentleman with history of DVT and retroperitoneal
hemorrhage presents to IR for filter placement

[Series 1: ir (id) (id)/(id)/(id) ir · 1 of 1 slices shown]
[im 1/1]
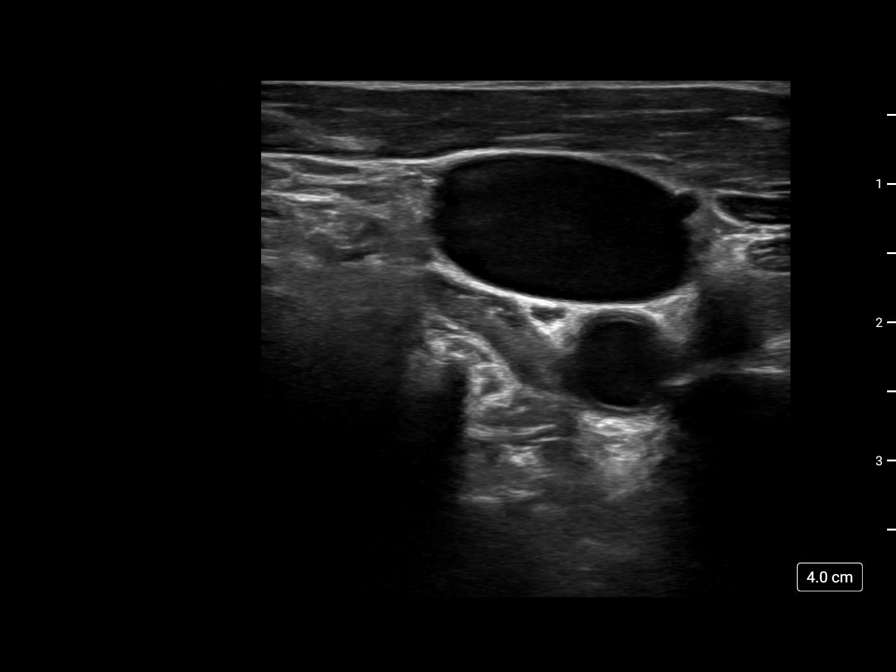

[Series 1: processed: ir ivc filter plmt / s&i /img · 4 of 12 frames shown]
[frame 2/12]
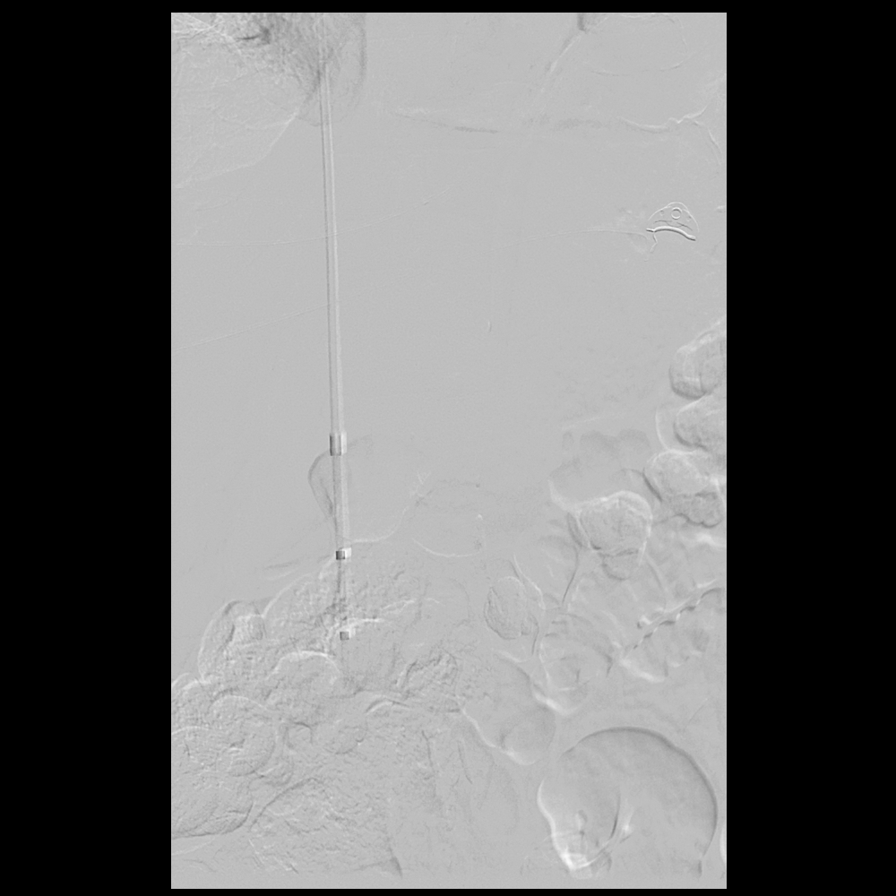
[frame 7/12]
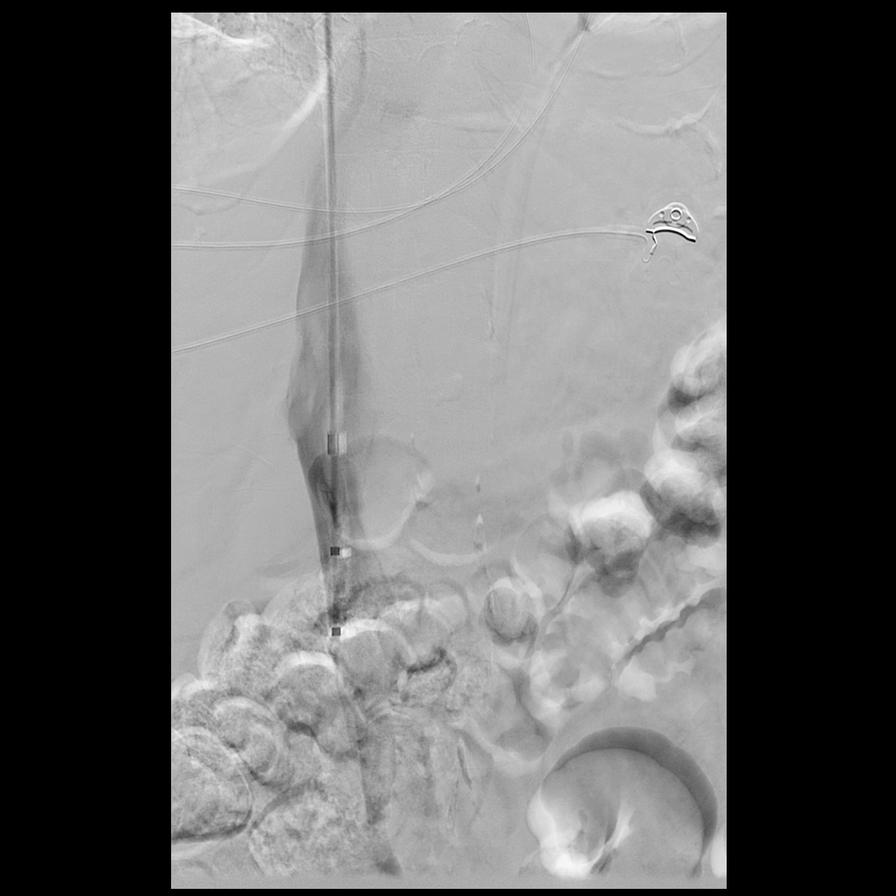
[frame 11/12]
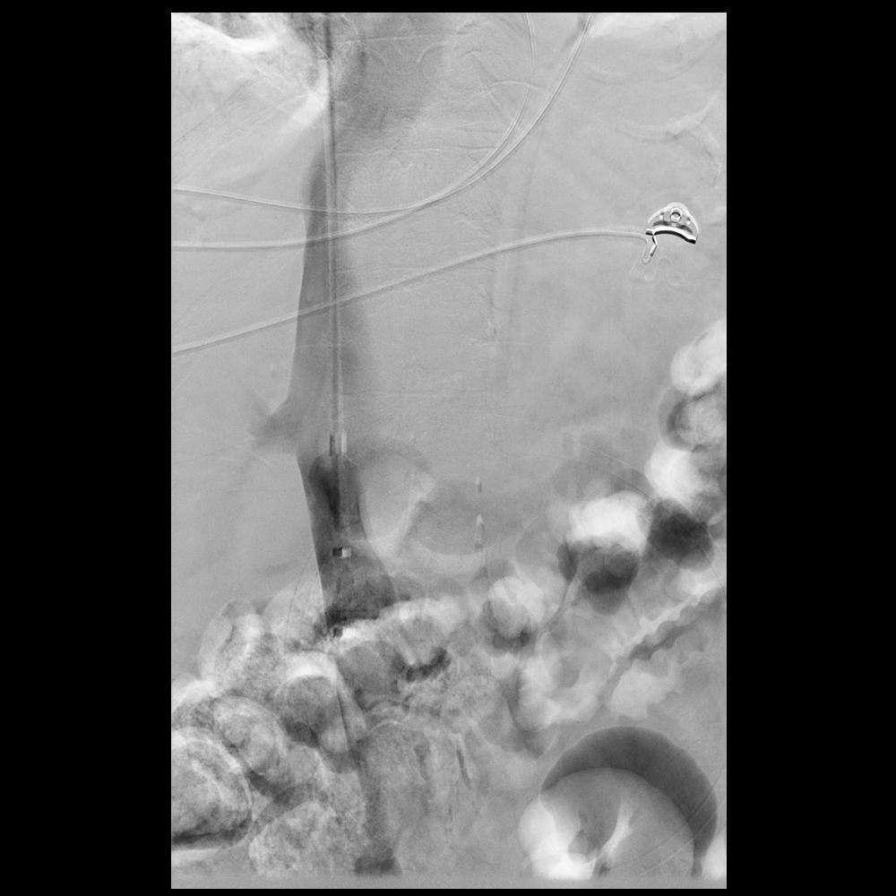
[frame 12/12]
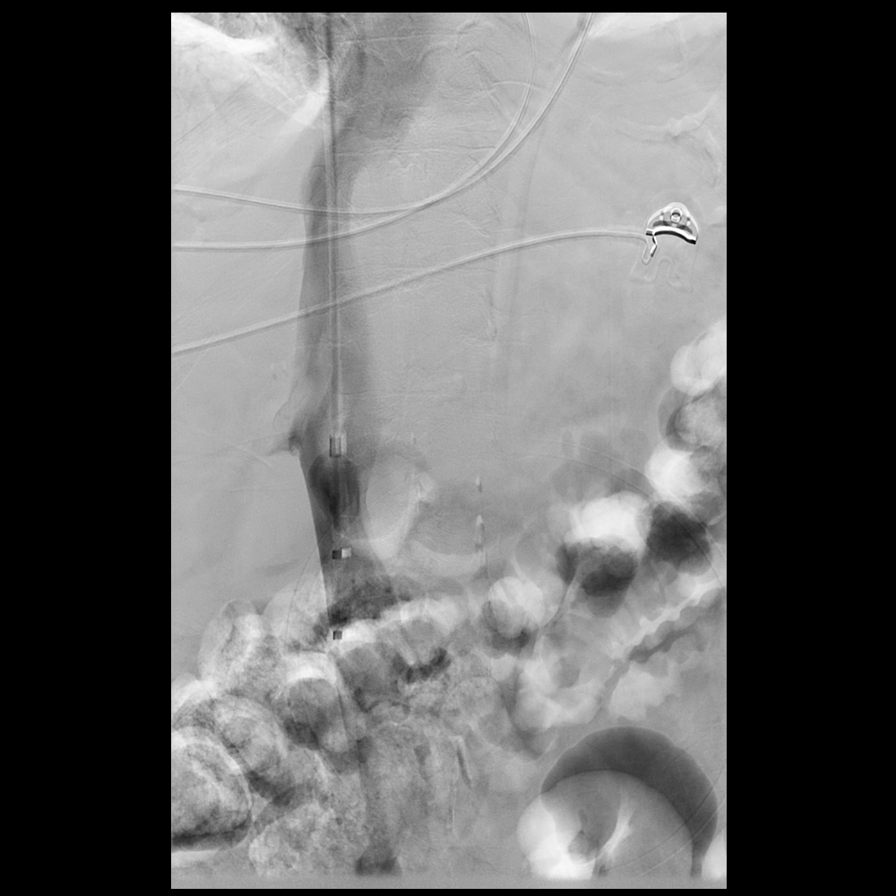

[5 of 5 positions shown; findings below may reference images not displayed]

EXAM:
Ultrasound fluoroscopic guided IVC filter placement

MEDICATIONS:
None.

ANESTHESIA/SEDATION:
Moderate (conscious) sedation was employed during this procedure. A
total of Versed 1 mg and Fentanyl 50 mcg was administered
intravenously by the radiology nurse.

Total intra-service moderate Sedation Time: 10 minutes. The
patient's level of consciousness and vital signs were monitored
continuously by radiology nursing throughout the procedure under my
direct supervision.

FLUOROSCOPY TIME:  Fluoroscopy Time: 2 minutes 30 seconds (110 mGy).

COMPLICATIONS:
None immediate.

PROCEDURE:
Informed written consent was obtained from the patient after a
thorough discussion of the procedural risks, benefits and
alternatives. All questions were addressed. Maximal Sterile Barrier
Technique was utilized including caps, mask, sterile gowns, sterile
gloves, sterile drape, hand hygiene and skin antiseptic. A timeout
was performed prior to the initiation of the procedure.

Patient positioned supine on the angiography table.

Right neck prepped and draped in usual sterile fashion. All elements
of maximal sterile barrier were utilized including, cap, mask,
sterile gown, sterile gloves, large sterile drape, hand scrubbing
and 2% Chlorhexidine for skin cleaning.

The right internal jugular vein was evaluated with ultrasound and
shown to be patent. A permanent ultrasound image was obtained and
placed in the patient's medical record.

Using sterile gel and a sterile probe cover, the right internal
jugular vein was entered with a 21 ga needle during real time
ultrasound guidance. 21 gauge needle exchanged for a transitional
dilator [DATE] inch guidewire. Transitional dilator set
exchanged for 10 fr sheath over 0.035 inch guidewire.

Sheath placed to the infrarenal IVC and venogram performed to
delineate the position of the renal veins.

Retrievable Denali IVC filter deployed in the infrarenal location.

Sheath removed and hemostasis achieved with manual compression.
IMPRESSION: Retrievable Denali IVC filter placement as above.

PLAN:
This IVC filter is potentially retrievable. The patient will be
approximately 8-12 weeks. Further recommendations regarding filter
retrieval, continued surveillance or declaration of device
permanence, will be made at that time.

## 2021-08-29 MED ORDER — LIDOCAINE HCL 1 % IJ SOLN
INTRAMUSCULAR | Status: AC
  Filled 2021-08-29: qty 20

## 2021-08-29 MED ORDER — IOHEXOL 300 MG/ML  SOLN
50.0000 mL | Freq: Once | INTRAMUSCULAR | Status: AC | PRN
  Administered 2021-08-29: 35 mL via INTRAVENOUS

## 2021-08-29 MED ORDER — MIDAZOLAM HCL 2 MG/2ML IJ SOLN
INTRAMUSCULAR | Status: AC
  Administered 2021-08-29: 1 mg
  Filled 2021-08-29: qty 2

## 2021-08-29 MED ORDER — DEXAMETHASONE SODIUM PHOSPHATE 10 MG/ML IJ SOLN
10.0000 mg | Freq: Two times a day (BID) | INTRAMUSCULAR | Status: DC
  Administered 2021-08-29 – 2021-08-30 (×2): 10 mg via INTRAVENOUS
  Filled 2021-08-29 (×2): qty 1

## 2021-08-29 MED ORDER — LIDOCAINE HCL 1 % IJ SOLN
INTRAMUSCULAR | Status: AC | PRN
  Administered 2021-08-29: 10 mL via INTRADERMAL

## 2021-08-29 MED ORDER — FENTANYL CITRATE (PF) 100 MCG/2ML IJ SOLN
INTRAMUSCULAR | Status: AC
  Filled 2021-08-29: qty 2

## 2021-08-29 NOTE — Procedures (Signed)
Interventional Radiology Procedure Note  Procedure: IVC filter  Indication: DVT. Retroperitoneal bleed.  Findings: Please refer to procedural dictation for full description.  Complications: None  EBL: < 10 mL  Miachel Roux, MD [phone]

## 2021-08-29 NOTE — Progress Notes (Signed)
BLE venous duplex has been completed.  Preliminary findings given to Salvadore Dom, NP.   Results can be found under chart review under CV PROC. 08/29/2021 10:23 AM Joshlynn Alfonzo RVT, RDMS

## 2021-08-29 NOTE — Progress Notes (Signed)
Referring Physician(s): Babcock,P, NP  Supervising Physician: Corrie Mckusick  Patient Status:  [facility] - In-pt  Chief Complaint: Dyspnea, chest discomfort, right leg discomfort/numbness   Subjective: Patient familiar to our service from Port-A-Cath placement on 07/12/2021.  He is an 81 year old male with recently diagnosed incurable stage IV gastric adenocarcinoma, currently on palliative chemotherapy, who was admitted to [facility] on 1/19 /23 with dyspnea and fever.  He was started on antibiotics for possible sepsis and underwent CTA of the chest which revealed right subsegmental PE and concern for possible RV strain.  He received tPA.  He was started on heparin drip and admitted to [facility].  He was also placed on Levophed due to hypotension . On 1/20 he had near syncopal event when he attempted to stand to urinate.  Patient also became more dyspneic and was placed on BiPAP.  He was weaned off Levophed on 1/21.  He has recently had right groin/leg discomfort and numbness.  Imaging revealed bilateral iliacus muscle hematomas with edema tracking into the surrounding retroperitoneum and along the right pelvic sidewall and right lateral abdominal wall musculature.  Lower extremity venous Doppler today with DVT.  Request now received for IVC filter placement.  IV heparin has been stopped.  He currently denies fever, headache, nausea, vomiting or visible bleeding.  He continues to have some dyspnea with exertion, occasional cough, right leg discomfort /numbness.  Past Medical History:  Diagnosis Date   Basal cell carcinoma 05/29/2012   left thigh-(EXC)   Basal cell carcinoma 10/23/2012   right side of nose (MOHS)   Basal cell carcinoma 12/12/2012   nod-irght cheek (MOHS)   Basal cell carcinoma 05/26/2015   nod-left calf (CX35FU)   Basal cell carcinoma 05/26/2015   nod-left nose (txpbx)   Basal cell carcinoma 06/08/2016   nod-above right brow (CX35FU)   Basal cell carcinoma 09/12/2016    left nose (MOHS)   Basal cell carcinoma 01/29/2019   nod-right tip of nose (MOHS)   Benign neoplasm of colon    Finger fracture    Hx of colonoscopy    Hyperlipidemia    Hypertension    Internal hemorrhoids without mention of complication    Myocardial infarction ([place])    09/01/2015   Plantar fascial fibromatosis    Prostatitis    Rib fracture    Squamous cell carcinoma of skin 07/13/2009   RIght lower outer leg   Squamous cell carcinoma of skin 02/17/2014   in situ-right temple (txpbx)   Squamous cell carcinoma of skin 01/24/2018   in situ-right chest (txpbx)   Squamous cell carcinoma of skin 01/24/2018   in situ0 right forehead (txpbx)   Squamous cell carcinoma of skin    Past Surgical History:  Procedure Laterality Date   cardiac stint  2017   COLONOSCOPY  2016   IR IMAGING GUIDED PORT INSERTION  07/12/2021   IR US GUIDE BX ASP/DRAIN  07/12/2021   POLYPECTOMY     TONSILLECTOMY     VARICOCELECTOMY     Left     Allergies: Ofloxacin  Medications: Prior to Admission medications   Medication Sig Start Date End Date Taking? Authorizing Provider  ALPRAZolam Duanne Moron) 0.5 MG tablet Take 1 tablet (0.5 mg total) by mouth 2 (two) times daily as needed for anxiety. 07/07/21  Yes Biagio Borg, MD  aspirin EC 81 MG tablet Take 81 mg by mouth every evening.   Yes [provider]  atorvastatin (LIPITOR) 80 MG tablet Take 1  tablet (80 mg total) by mouth daily at 6 PM. 01/27/21  Yes Hilty, Nadean Corwin, MD  Cholecalciferol (VITAMIN D3 PO) Take 1 tablet by mouth daily.   Yes [provider]  lidocaine-prilocaine (EMLA) cream Apply 1 application topically as needed. Apply 0.33gm to PortaCath site 30 to 60 mins prior to PortaCath access. 07/27/21  Yes Truitt Merle, MD  lisinopril (ZESTRIL) 2.5 MG tablet Take 1 tablet (2.5 mg total) by mouth daily. 12/01/20  Yes Biagio Borg, MD  mirtazapine (REMERON) 7.5 MG tablet Take 1 tablet (7.5 mg total) by mouth at bedtime. 08/25/21  Yes  Alla Feeling, NP  morphine (MS CONTIN) 15 MG 12 hr tablet Take 1 tablet (15 mg total) by mouth every 12 (twelve) hours. 08/25/21  Yes Alla Feeling, NP  nitroGLYCERIN (NITROSTAT) 0.4 MG SL tablet Place 1 tablet (0.4 mg total) under the tongue every 5 (five) minutes as needed for chest pain. Max 3 doses. 03/13/18  Yes Hilty, Nadean Corwin, MD  Omega-3 Fatty Acids (FISH OIL) 500 MG CAPS Take 500 mg by mouth daily.   Yes [provider]  oxyCODONE (OXY IR/ROXICODONE) 5 MG immediate release tablet Take 1 tablet (5 mg total) by mouth every 4 (four) hours as needed for severe pain. 08/12/21  Yes Truitt Merle, MD  capecitabine (XELODA) 500 MG tablet Take 3 tablets every 12 hours, for 14 days then off for 7 days. Take after meal. 08/21/21   Truitt Merle, MD  HYDROcodone-acetaminophen (NORCO) 5-325 MG tablet Take 1 tablet by mouth every 4 (four) hours as needed for moderate pain. 08/05/21   Truitt Merle, MD  ondansetron (ZOFRAN) 8 MG tablet Take 1 tablet (8 mg total) by mouth every 8 (eight) hours as needed for nausea or vomiting. 07/27/21   Truitt Merle, MD  prochlorperazine (COMPAZINE) 10 MG tablet Take 1 tablet (10 mg total) by mouth every 6 (six) hours as needed for nausea or vomiting. 07/27/21   Truitt Merle, MD  traMADol (ULTRAM) 50 MG tablet Take 1 tablet (50 mg total) by mouth every 6 (six) hours as needed. Patient not taking: Reported on 08/25/2021 07/21/21   Lincoln Brigham, PA-C     Vital Signs: BP 111/83    Pulse 64    Temp 97.8 F (36.6 C) (Oral)    Resp 10    Ht 6\' 1"  (1.854 m)    Wt 184 lb 4.8 oz (83.6 kg)    SpO2 91%    BMI 24.32 kg/m   Physical Exam awake, alert.  Spouse in room.  Chest clear to auscultation bilaterally.  Clean, intact right chest wall Port-A-Cath.  Heart with regular rate and rhythm.  Abdomen soft, positive bowel sounds, nontender.  Bilateral pretibial edema noted.  Imaging: CT Angio Chest Pulmonary Embolism (PE) W or WO Contrast  Result Date: 08/25/2021 CLINICAL DATA:   Pulmonary embolism (PE) suspected, high prob short of breath, ca patient, eval for PE EXAM: CT ANGIOGRAPHY CHEST WITH CONTRAST TECHNIQUE: Multidetector CT imaging of the chest was performed using the standard protocol during bolus administration of intravenous contrast. Multiplanar CT image reconstructions and MIPs were obtained to evaluate the vascular anatomy. RADIATION DOSE REDUCTION: This exam was performed according to the departmental dose-optimization program which includes automated exposure control, adjustment of the mA and/or kV according to patient size and/or use of iterative reconstruction technique. CONTRAST:  69mL OMNIPAQUE IOHEXOL 350 MG/ML SOLN COMPARISON:  CT angiography chest 07/01/2021 FINDINGS: Cardiovascular: Fair opacification of the pulmonary arteries to  the segmental level. Right middle lobe segmental subsegmental pulmonary embolus. Chronic enlarged right to left ventricular ratio 1.4. No significant pericardial effusion. The ascending thoracic aorta is enlarged in caliber measuring up to 4.6 cm. The descending thoracic aorta is normal in caliber. At least mild atherosclerotic plaque of the thoracic aorta. Left anterior descending coronary artery calcifications. Mediastinum/Nodes: Stable internal mammary 0.5 cm lymph node on the left (5:86). Stable other inferior measuring 0.3 cm (5:121). Asymmetrically prominent left axillary lymph nodes compared to the right. No enlarged mediastinal, hilar, or axillary lymph nodes. Thyroid gland, trachea, and esophagus demonstrate no significant findings. Lungs/Pleura: Trace biapical paraseptal emphysematous changes. Biapical pleural/pulmonary scarring. No focal consolidation. Stable scattered pulmonary micronodule within the right lung. Stable 6 mm left lower lobe pulmonary nodule (6:118). Stable 6 mm right lower lobe pulmonary nodule (6:78). No new pulmonary nodule. Pulmonary mass. No pleural effusion. No pneumothorax. Upper Abdomen: Redemonstration of  innumerable ill-defined hypodensities within the hepatic parenchyma consistent with metastases. Musculoskeletal: No abdominal wall hernia or abnormality. No suspicious lytic or blastic osseous lesions. No acute displaced fracture. Multilevel degenerative changes of the spine. Review of the MIP images confirms the above findings. IMPRESSION: 1. Right middle lobe segmental and subsegmental pulmonary embolus. Enlarged right to left ventricular ratio 1.4 suggestive of right heart strain. No pulmonary infarction. 2. Stable ascending thoracic aorta aneurysm (4.6 cm). Ascending thoracic aortic aneurysm. Recommend semi-annual imaging followup by CTA or MRA and referral to cardiothoracic surgery if not already obtained. This recommendation follows 2010 ACCF/AHA/AATS/ACR/ASA/SCA/SCAI/SIR/STS/SVM Guidelines for the Diagnosis and Management of Patients With Thoracic Aortic Disease. Circulation. 2010; 121: Z610-R604. Aortic aneurysm NOS (ICD10-I71.9) 3. Hepatic metastases poorly visualized. 4. Asymmetrically prominent left axillary lymph nodes compared to the right. 5. Couple of stable subcentimeter left internal mammary lymph nodes. 6. Stable 6 mm bilateral lower lobe pulmonary nodules. Stable scattered pulmonary micronodules within the right lung. 7. Aortic Atherosclerosis (ICD10-I70.0) and Emphysema (ICD10-J43.9). These results were called by telephone at the time of interpretation on 08/25/2021 at 10:11 pm to provider [facility] , who verbally acknowledged these results. Electronically Signed   By: Iven Finn M.D.   On: 08/25/2021 22:21   MR LUMBAR SPINE WO CONTRAST  Result Date: 08/28/2021 CLINICAL DATA:  Worsening back pain and radiculopathy. History of metastatic gastric cancer EXAM: MRI LUMBAR SPINE WITHOUT CONTRAST TECHNIQUE: Multiplanar, multisequence MR imaging of the lumbar spine was performed. No intravenous contrast was administered. COMPARISON:  08/24/2021 CT scan FINDINGS: Despite efforts by the  technologist and patient, motion artifact is present on today's exam and could not be eliminated. This reduces exam sensitivity and specificity. Segmentation: The lowest lumbar type non-rib-bearing vertebra is labeled as L5. Alignment:  No vertebral subluxation is observed. Vertebrae: Mild degenerative endplate findings. Several small hemangiomas are observed. Faintly reduced T1 signal in the marrow although not pathologically reduced, with marrow generally greater intervertebral disc signal on the T1 weighted images. Conus medullaris and cauda equina: Conus extends to the L1-2 level. Conus and cauda equina appear normal. Paraspinal and other soft tissues: There is a new large complex fluid collection in the right iliacus muscle, with internal fluid-fluid level superiorly on image 28 series 12, and a larger inferior component measuring at least 6 cm in diameter and likely substantially larger. This tracks beyond the inferior-most images of today's exam and is associated with abnormal infiltrative edema in the left iliacus muscle. Given that there was no abnormality in this vicinity on 08/24/2021, and given the potential internal hematocrit levels, I  tend to favor acute hematoma over iliacus muscle abscess. There is some associated edema tracking along the right retroperitoneum. The patient has known pathologic retroperitoneal adenopathy as on recent CT scan. Disc levels: L1-2: Unremarkable. L2-3: No impingement.  Mild disc bulge. L3-4: No impingement.  Mild disc bulge. L4-5: Moderate central narrowing of the thecal sac due to disc bulge and ligamentum flavum redundancy. L5-S1: No impingement.  Bilateral degenerative facet arthropathy. IMPRESSION: 1. Large new complex right iliacus muscle fluid collection, rapid onset favoring hematoma over abscess. There is associated edema in the iliacus muscle and along the adjacent right retroperitoneum. 2. Moderate central narrowing of the thecal sac at L4-5 due to disc bulge  and ligamentum flavum redundancy. 3. Retroperitoneal adenopathy as shown on recent CT. Electronically Signed   By: Van Clines M.D.   On: 08/28/2021 18:24   MR PELVIS WO CONTRAST  Result Date: 08/28/2021 CLINICAL DATA:  Right groin pain.  Metastatic gastric cancer. EXAM: MRI PELVIS WITHOUT CONTRAST TECHNIQUE: Multiplanar multisequence MR imaging of the pelvis was performed. No intravenous contrast was administered. COMPARISON:  Radiographs 08/28/2021 and CT pelvis from 08/24/2021 in addition to lumbar MRI from 08/28/2021 FINDINGS: Despite efforts by the technologist and patient, motion artifact is present on today's exam and could not be eliminated. This reduces exam sensitivity and specificity. OSSEOUS STRUCTURES AND JOINTS: No fracture or compelling evidence of regional osseous metastatic disease. No SI joint or hip joint effusion. MUSCULAR STRUCTURES: 13.6 by 6.5 by 5.4 cm (volume = 250 cm^3) right iliacus muscle hematoma with internal hematocrit levels, and associated edema tracking in the iliacus muscle and in the surrounding retroperitoneum and along the right pelvic sidewall. There is also a 6.2 by 2.3 by 2.4 cm (volume = 18 cm^3) left iliacus muscle hematoma with surrounding iliacus muscle edema. Bilateral direct inguinal hernias are present. The left hernia contains part of the proximal sigmoid colon, the right hernia appears to contain a small amount of fluid and adipose tissue currently although previously contained a loop of small bowel. There is trace edema in a symmetric manner along the hip adductor musculature. Other: Prostate gland grossly unremarkable. Urinary bladder unremarkable. IMPRESSION: 1. Approximally 215 cc right iliacus muscle hematoma. 18 cc left iliacus muscle hematoma. Both are associated with edema in the iliacus muscles. 2. The right iliacus muscle hematoma is associated with edema tracking in the surrounding retroperitoneum and along the right pelvic sidewall and right  lateral abdominal wall musculature. 3. Bilateral direct inguinal hernias. The left hernia contains part of the colon in the right hernia contains adipose tissue and a small amount of fluid. Electronically Signed   By: Van Clines M.D.   On: 08/28/2021 19:10   DG CHEST PORT 1 VIEW  Result Date: 08/26/2021 CLINICAL DATA:  Shortness of breath, decreased O2 sats EXAM: PORTABLE CHEST 1 VIEW COMPARISON:  Chest radiograph 08/25/2021 FINDINGS: A right chest wall port is again seen with the tip at the cavoatrial junction. The cardiomediastinal silhouette is stable. There is no focal consolidation or pulmonary edema. There is no pleural effusion or pneumothorax. Aeration is unchanged since 08/25/2021. There is no acute osseous abnormality. IMPRESSION: No radiographic evidence of acute cardiopulmonary process. No significant interval change since 08/25/2021 Electronically Signed   By: Valetta Mole M.D.   On: 08/26/2021 07:06   DG Chest Port 1 View  Result Date: 08/25/2021 CLINICAL DATA:  Weakness, fever, stage IV gastric cancer EXAM: PORTABLE CHEST 1 VIEW COMPARISON:  08/24/2021, 07/01/2021 FINDINGS: Two frontal views of  the chest demonstrate right chest wall port tip overlying superior vena cava. The cardiac silhouette is unremarkable. No acute airspace disease, effusion, or pneumothorax. The pulmonary nodule seen on preceding chest CT are not well visualized by x-ray. No acute bony abnormalities. IMPRESSION: 1. No acute airspace disease. 2. Please refer to CT chest performed 08/24/2021 describing intrathoracic metastatic disease not apparent by x-ray. Electronically Signed   By: Randa Ngo M.D.   On: 08/25/2021 18:13   ECHOCARDIOGRAM COMPLETE  Result Date: 08/26/2021    ECHOCARDIOGRAM REPORT   Patient Name:   Billy [name] Date of Exam: 08/26/2021 Medical Rec #:  [id number]      Height:       73.0 in Accession #:    [id number]     Weight:       184.3 lb Date of Birth:  05-08-1941      BSA:          2.078  m Patient Age:    61 years       BP:           108/63 mmHg Patient Gender: M              HR:           76 bpm. Exam Location:  Inpatient Procedure: 2D Echo Indications:    Pulmonary embolism  History:        Patient has prior history of Echocardiogram examinations, most                 recent 08/16/2021. Previous Myocardial Infarction; Risk                 Factors:Dyslipidemia and Hypertension.  Sonographer:    Arlyss Gandy Referring Phys: [place]  1. Left ventricular ejection fraction, by estimation, is 55 to 60%. The left ventricle has normal function. The left ventricle has no regional wall motion abnormalities. There is mild concentric left ventricular hypertrophy. Left ventricular diastolic parameters are consistent with Grade I diastolic dysfunction (impaired relaxation).  2. Right ventricular systolic function is normal. The right ventricular size is mildly enlarged. There is normal pulmonary artery systolic pressure.  3. Right atrial size was moderately dilated.  4. The mitral valve is grossly normal. No evidence of mitral valve regurgitation.  5. The aortic valve is grossly normal. Aortic valve regurgitation is trivial.  6. Aortic ascending aortic aneurysm 4.6 cm.  7. The inferior vena cava is normal in size with greater than 50% respiratory variability, suggesting right atrial pressure of 3 mmHg.  8. Cannot rule out PFO. Conclusion(s)/Recommendation(s): So significant changes compared to echo 08/16/2021; Recommend annual surveillance echo for ascending aortic aneurysm. FINDINGS  Left Ventricle: Left ventricular ejection fraction, by estimation, is 55 to 60%. The left ventricle has normal function. The left ventricle has no regional wall motion abnormalities. The left ventricular internal cavity size was normal in size. There is  mild concentric left ventricular hypertrophy. Left ventricular diastolic parameters are consistent with Grade I diastolic dysfunction (impaired  relaxation). Right Ventricle: The right ventricular size is mildly enlarged. No increase in right ventricular wall thickness. Right ventricular systolic function is normal. There is normal pulmonary artery systolic pressure. The tricuspid regurgitant velocity is 2.50  m/s, and with an assumed right atrial pressure of 3 mmHg, the estimated right ventricular systolic pressure is 90.2 mmHg. Left Atrium: Left atrial size was normal in size. Right Atrium: Right atrial size was moderately dilated. Pericardium: There is no evidence  of pericardial effusion. Mitral Valve: The mitral valve is grossly normal. No evidence of mitral valve regurgitation. Tricuspid Valve: The tricuspid valve is grossly normal. Tricuspid valve regurgitation is mild. Aortic Valve: The aortic valve is grossly normal. Aortic valve regurgitation is trivial. Aortic valve mean gradient measures 5.0 mmHg. Aortic valve peak gradient measures 9.5 mmHg. Aortic valve area, by VTI measures 2.64 cm. Pulmonic Valve: The pulmonic valve was grossly normal. Pulmonic valve regurgitation is not visualized. Aorta: Ascending aortic aneurysm 4.6 cm. Venous: The inferior vena cava is normal in size with greater than 50% respiratory variability, suggesting right atrial pressure of 3 mmHg. IAS/Shunts: Cannot rule out PFO.  LEFT VENTRICLE PLAX 2D LVIDd:         4.60 cm   Diastology LVIDs:         3.30 cm   LV e' medial:    5.66 cm/s LV PW:         1.10 cm   LV E/e' medial:  14.2 LV IVS:        1.10 cm   LV e' lateral:   7.51 cm/s LVOT diam:     2.00 cm   LV E/e' lateral: 10.7 LV SV:         81 LV SV Index:   39 LVOT Area:     3.14 cm  RIGHT VENTRICLE             IVC RV Basal diam:  4.90 cm     IVC diam: 2.20 cm RV Mid diam:    3.60 cm RV S prime:     14.60 cm/s TAPSE (M-mode): 2.6 cm LEFT ATRIUM             Index        RIGHT ATRIUM           Index LA diam:        4.10 cm 1.97 cm/m   RA Area:     32.00 cm LA Vol (A2C):   51.4 ml 24.73 ml/m  RA Volume:   121.00 ml 58.22  ml/m LA Vol (A4C):   58.0 ml 27.91 ml/m LA Biplane Vol: 57.5 ml 27.67 ml/m  AORTIC VALVE AV Area (Vmax):    2.81 cm AV Area (Vmean):   2.63 cm AV Area (VTI):     2.64 cm AV Vmax:           154.50 cm/s AV Vmean:          103.500 cm/s AV VTI:            0.308 m AV Peak Grad:      9.5 mmHg AV Mean Grad:      5.0 mmHg LVOT Vmax:         138.00 cm/s LVOT Vmean:        86.650 cm/s LVOT VTI:          0.259 m LVOT/AV VTI ratio: 0.84  AORTA Ao Root diam: 3.90 cm Ao Asc diam:  4.60 cm MITRAL VALVE                TRICUSPID VALVE MV Area (PHT): 2.76 cm     TR Peak grad:   25.0 mmHg MV Decel Time: 275 msec     TR Vmax:        250.00 cm/s MV E velocity: 80.60 cm/s MV A velocity: 113.00 cm/s  SHUNTS MV E/A ratio:  0.71         Systemic VTI:  0.26  m                             Systemic Diam: 2.00 cm Phineas Inches Electronically signed by Phineas Inches Signature Date/Time: 08/26/2021/11:48:38 AM    Final    DG HIP UNILAT WITH PELVIS 2-3 VIEWS RIGHT  Result Date: 08/28/2021 CLINICAL DATA:  Severe right-sided hip pain EXAM: DG HIP (WITH OR WITHOUT PELVIS) 2-3V RIGHT COMPARISON:  CT 07/01/2021 FINDINGS: Residual enteral contrast in the colon and rectum. SI joints are non widened. Pubic symphysis and rami appear intact. No definitive fracture or malalignment. IMPRESSION: No acute osseous abnormality Electronically Signed   By: Donavan Foil M.D.   On: 08/28/2021 16:50    Labs:  CBC: Recent Labs    08/27/21 0905 08/28/21 0500 08/28/21 1904 08/29/21 0500  WBC 15.6* 11.5* 11.0* 5.6  HGB 9.9* 8.7* 8.4* 8.2*  HCT 31.0* 27.5* 26.4* 26.1*  PLT 317 274 272 256    COAGS: Recent Labs    07/12/21 1235 08/25/21 1910 08/26/21 1135  INR 1.0 1.1 1.5*  APTT  --   --  62*    BMP: Recent Labs    08/25/21 0844 08/25/21 1910 08/26/21 0308 08/27/21 0905  NA 136 135 133* 133*  K 4.2 4.3 4.7 4.0  CL 103 104 106 106  CO2 25 21* 20* 20*  GLUCOSE 111* 113* 125* 128*  BUN 18 22 24* 35*  CALCIUM 8.8* 8.5* 7.4* 7.7*   CREATININE 0.98 1.06 1.17 1.02  GFRNONAA >60 >60 >60 >60    LIVER FUNCTION TESTS: Recent Labs    08/10/21 1154 08/18/21 0858 08/25/21 0844 08/25/21 1910  BILITOT 1.0 1.0 0.7 0.6  AST 200* 123* 88* 116*  ALT 117* 69* 59* 71*  ALKPHOS 567* 507* 415* 511*  PROT 6.7 6.2* 6.3* 6.1*  ALBUMIN 3.4* 3.1* 3.2* 2.7*    Assessment and Plan: 81 year old male with recently diagnosed incurable stage IV gastric adenocarcinoma, currently on palliative chemotherapy, who was admitted to [facility] on 1/19 /23 with dyspnea and fever.  He was started on antibiotics for possible sepsis and underwent CTA of the chest which revealed right subsegmental PE and concern for possible RV strain.  He received tPA.  He was started on heparin drip and admitted to [facility].  He was also placed on Levophed due to hypotension . On 1/20 he had near syncopal event when he attempted to stand to urinate.  Patient also became more dyspneic and was placed on BiPAP.  He was weaned off Levophed on 1/21.  He has recently had right groin/leg discomfort and numbness.  Imaging revealed bilateral iliacus muscle hematomas with edema tracking into the surrounding retroperitoneum and along the right pelvic sidewall and right lateral abdominal wall musculature.  Lower extremity venous Doppler today with DVT.  Request now received for IVC filter placement.  IV heparin has been stopped.  Imaging studies have been reviewed by Dr. Earleen Newport.Risks and benefits discussed with the patient/spouse including, but not limited to bleeding, infection, contrast induced renal failure, filter fracture or migration which can lead to emergency surgery or even death, strut penetration with damage or irritation to adjacent structures and caval thrombosis.  All of the patient's questions were answered, patient is agreeable to proceed. Consent signed and in chart.  Procedure tentatively scheduled for this afternoon.   Electronically Signed: D. Rowe Robert,  PA-C 08/29/2021, 10:11 AM   I spent a total of 25 Minutes  at the the patient's bedside AND on the patient's hospital floor or unit, greater than 50% of which was counseling/coordinating care for IVC filter placement    Patient ID: JAQUA CHING, male   DOB: 1941-02-10, 81 y.o.   MRN: [id number]

## 2021-08-29 NOTE — Progress Notes (Addendum)
NAME:  Billy [name], MRN:  [id number], DOB:  May 05, 1941, LOS: 3 ADMISSION DATE:  08/25/2021, CONSULTATION DATE:  08/26/21 REFERRING MD:  Verlon Au - TR[facility], CHIEF COMPLAINT:  PE   History of Present Illness:  81 yo M formerly very active [place] who recently was diagnosed with incurable stg IV gastric adenocarcinoma, currently on palliative chemo (FOLFOX, trastuzumab + pembrolizumab, with plan to switch FOLFOX to capox after 2 cycles) , who presented to [facility] ED 1/19 with SOB and fever T 102. He was started on braod abx for possible sepsis in immunocomp host, and was sent for CTA chest which revealed R subsegmental PE and was concerning for possible RV strain with RV:LV 1.4  Of note, in review of outpt oncology note there is reference to a possible hypersensitivity reaction to pembrolizumab and trastuzumab   Started on hep gtt and admitted to [facility]. Initially hypotensive but after 3.5 L IVF this improved. On 1/20 pt had a near syncopal event when he attempted to to stand to urinate. He then exhibited worsening SOB and hypoxia and was placed on BiPAP.   Trops initially were 62 but these continue to trend upward, now 209. BNP is elevated to 493, LA 3 --suggestive of at least submassive PE  PCCM consulted in this setting   Pertinent  Medical History  Stg IV gastric adenocarcinoma   Significant Hospital Events: Including procedures, antibiotic start and stop dates in addition to other pertinent events   1/19 ED for SOB. R subsegmental PE with possible RV strain, hypoxia, hypotension. TR[facility] admit. Heparin gtt + broad abx for fever 1/20 Near syncopal event, worse hypoxia, recurrent hypotension. PCCM consulted>>> Rx TPA 1/21 weaned off levophed 1/22: reported cramping right groin and leg. W/ right leg numbness. MRI completed->large Right hematoma involving ileopsoas. Heparin stopped. Hgb 9.9 (1/21) to 8.7->8.4 1/23 LE ultrasound + DVT. IR consulted for retrievable IVC filter.   Interim  History / Subjective:  Still having right leg pain and weakness    Objective   Blood pressure 111/83, pulse 64, temperature 97.8 F (36.6 C), temperature source Oral, resp. rate 10, height 6\' 1"  (1.854 m), weight 83.6 kg, SpO2 91 %.        Intake/Output Summary (Last 24 hours) at 08/29/2021 0907 Last data filed at 08/29/2021 7588 Gross per 24 hour  Intake 658.64 ml  Output 550 ml  Net 108.64 ml   Filed Weights   08/25/21 2211  Weight: 83.6 kg    Examination: General 81 year old male sitting up in bed. No distress HENT NCAT no JVD  Pulm clear no accessory use  Card RRR no MRG Ext warm and dry trace LE edema Neuro intact   Resolved Hospital Problem list   Massive PE w/ obstructive shock: S/p TPA  1/20 improved am 1/21 and weaned off levophed  Assessment & Plan:   Massive PE w/ DVT c/b large Right hematoma involving ileopsoas s/p TPA Plan Will consult IR for retrievable filter.  Can re-assess for life long AC in about 3-4 weeks  Post-TPA hematoma w/ ABLA Plan Repeat CBC am  Holding AC     Stg IV metastatic gastric adenocarcinoma  -on palliative chemo outpt  Plan F/u w/ onc   Fever w/ leukocytosis  -feel mor SIRS and NOT infection.  Plan Stop abx   Maculopapular rash -outpt noted to have rxn to immunologic therapy. Presented with existing maculopapular rash   Plan Wean decadron   GOC DNR Status  Best Practice (right click and "Reselect all SmartList Selections" daily)   Diet/type: Regular consistency (see orders) DVT prophylaxis: other IVC filter ordered  GI prophylaxis: N/A Lines: yes and it is still needed -- port Foley:  N/A Code Status:  DNR Last date of multidisciplinary goals of care discussion [1/20] D/w pt, wife and daughter   Keep as SDU pt for filter and CBC monitoring. Could potentially go to [facility] 1/24 if stable. WIll ask triad to assume care effective 1/24 and we will s/o. given has stage IV cancer we will not follow him in  [place] office.   Erick Colace ACNP-BC [place] Pager # [phone] OR # [phone] if no answer

## 2021-08-29 NOTE — Progress Notes (Signed)
Billy [name]   DOB:14-Aug-1940   KN#:[id number]   FXT#:[id number]  Oncology follow up   Subjective: Weekend events reviewed, pt is going to have IVC filter placed by IR today. He is off pressor, with stable VS, still has pain and weakness in right LE. Wife at bedside.    Objective:  Vitals:   08/29/21 1400 08/29/21 1607  BP: (!) 130/95 127/80  Pulse: 67 73  Resp: 17 (!) 23  Temp:  97.9 F (36.6 C)  SpO2: 99% 95%    Body mass index is 24.32 kg/m.  Intake/Output Summary (Last 24 hours) at 08/29/2021 1717 Last data filed at 08/29/2021 0800 Gross per 24 hour  Intake 521.1 ml  Output 550 ml  Net -28.9 ml     Sclerae unicteric  Oropharynx clear  No peripheral adenopathy  Lungs clear -- no rales or rhonchi  Heart regular rate and rhythm  Abdomen benign  MSK no focal spinal tenderness, no peripheral edema  Neuro nonfocal    CBG (last 3)  No results for input(s): GLUCAP in the last 72 hours.   Labs:   Urine Studies No results for input(s): UHGB, CRYS in the last 72 hours.  Invalid input(s): UACOL, UAPR, USPG, UPH, UTP, UGL, UKET, UBIL, UNIT, UROB, [place], UEPI, UWBC, Billy [name] [place], [place]  Basic Metabolic Panel: Recent Labs  Lab 08/25/21 0844 08/25/21 1910 08/26/21 0308 08/27/21 0905  NA 136 135 133* 133*  K 4.2 4.3 4.7 4.0  CL 103 104 106 106  CO2 25 21* 20* 20*  GLUCOSE 111* 113* 125* 128*  BUN 18 22 24* 35*  CREATININE 0.98 1.06 1.17 1.02  CALCIUM 8.8* 8.5* 7.4* 7.7*   GFR Estimated Creatinine Clearance: 65.3 mL/min (by C-G formula based on SCr of 1.02 mg/dL). Liver Function Tests: Recent Labs  Lab 08/25/21 0844 08/25/21 1910  AST 88* 116*  ALT 59* 71*  ALKPHOS 415* 511*  BILITOT 0.7 0.6  PROT 6.3* 6.1*  ALBUMIN 3.2* 2.7*   No results for input(s): LIPASE, AMYLASE in the last 168 hours. No results for input(s): AMMONIA in the last 168 hours. Coagulation profile Recent Labs  Lab 08/25/21 1910 08/26/21 1135  INR 1.1 1.5*     CBC: Recent Labs  Lab 08/25/21 0844 08/25/21 1910 08/26/21 0308 08/26/21 1135 08/27/21 0905 08/28/21 0500 08/28/21 1904 08/29/21 0500  WBC 12.2* 10.5   < > 16.8* 15.6* 11.5* 11.0* 5.6  NEUTROABS 8.4* 9.9*  --   --   --   --   --   --   HGB 10.4* 10.5*   < > 9.4* 9.9* 8.7* 8.4* 8.2*  HCT 31.5* 32.6*   < > 29.7* 31.0* 27.5* 26.4* 26.1*  MCV 92.9 94.5   < > 95.5 95.7 95.8 95.3 96.0  PLT 358 407*   < > 305 317 274 272 256   < > = values in this interval not displayed.   Cardiac Enzymes: No results for input(s): CKTOTAL, CKMB, CKMBINDEX, TROPONINI in the last 168 hours. BNP: Invalid input(s): POCBNP CBG: No results for input(s): GLUCAP in the last 168 hours. D-Dimer No results for input(s): DDIMER in the last 72 hours. Hgb A1c No results for input(s): HGBA1C in the last 72 hours. Lipid Profile No results for input(s): CHOL, HDL, LDLCALC, TRIG, CHOLHDL, LDLDIRECT in the last 72 hours. Thyroid function studies No results for input(s): TSH, T4TOTAL, T3FREE, THYROIDAB in the last 72 hours.  Invalid input(s): FREET3 Anemia work up No results for  input(s): VITAMINB12, FOLATE, FERRITIN, TIBC, IRON, RETICCTPCT in the last 72 hours. Microbiology Recent Results (from the past 240 hour(s))  Blood Culture (routine x 2)     Status: None (Preliminary result)   Collection Time: 08/25/21  5:48 PM   Specimen: Right Antecubital; Blood  Result Value Ref Range Status   Specimen Description   Final    RIGHT ANTECUBITAL Performed at [facility] [address] [place], [place] [address]    Special Requests   Final    BOTTLES DRAWN AEROBIC AND ANAEROBIC Blood Culture results may not be optimal due to an inadequate volume of blood received in culture bottles Performed at [facility] [address] [place], [place] [address]    Culture   Final    NO GROWTH 4 DAYS Performed at [facility], [place] [address], [place], [place] [address]     Report Status PENDING  Incomplete  Blood Culture (routine x 2)     Status: None (Preliminary result)   Collection Time: 08/25/21  5:53 PM   Specimen: Left Antecubital; Blood  Result Value Ref Range Status   Specimen Description   Final    LEFT ANTECUBITAL Performed at [facility] [address] [place], [place] [address]    Special Requests   Final    BOTTLES DRAWN AEROBIC AND ANAEROBIC Blood Culture results may not be optimal due to an inadequate volume of blood received in culture bottles Performed at [place] [address] [place], [place] [address]    Culture   Final    NO GROWTH 4 DAYS Performed at [facility], [place] [address], [place], [place] [address]    Report Status PENDING  Incomplete  Resp Panel by RT-PCR (Flu A&B, Covid) Nasopharyngeal Swab     Status: None   Collection Time: 08/26/21  3:08 AM   Specimen: Nasopharyngeal Swab; Nasopharyngeal(NP) swabs in vial transport medium  Result Value Ref Range Status   SARS Coronavirus 2 by RT PCR NEGATIVE NEGATIVE Final    Comment: (NOTE) SARS-CoV-2 target nucleic acids are NOT DETECTED.  The SARS-CoV-2 RNA is generally detectable in upper respiratory specimens during the acute phase of infection. The lowest concentration of SARS-CoV-2 viral copies this assay can detect is 138 copies/mL. A negative result does not preclude SARS-Cov-2 infection and should not be used as the sole basis for treatment or other patient management decisions. A negative result may occur with  improper specimen collection/handling, submission of specimen other than nasopharyngeal swab, presence of viral mutation(s) within the areas targeted by this assay, and inadequate number of viral copies(<138 copies/mL). A negative result must be combined with clinical observations, patient history, and epidemiological information. The expected result is Negative.  Fact Sheet for Patients:   EntrepreneurPulse.com.au  Fact Sheet for Healthcare Providers:  IncredibleEmployment.be  This test is no t yet approved or cleared by the [place] FDA and  has been authorized for detection and/or diagnosis of SARS-CoV-2 by FDA under an Emergency Use Authorization (EUA). This EUA will remain  in effect (meaning this test can be used) for the duration of the COVID-19 declaration under Section 564(b)(1) of the Act, 21 U.S.C.section 360bbb-3(b)(1), unless the authorization is terminated  or revoked sooner.       Influenza A by PCR NEGATIVE NEGATIVE Final   Influenza B by PCR NEGATIVE NEGATIVE Final    Comment: (NOTE) The Xpert Xpress SARS-CoV-2/FLU/RSV plus assay is intended as  an aid in the diagnosis of influenza from Nasopharyngeal swab specimens and should not be used as a sole basis for treatment. Nasal washings and aspirates are unacceptable for Xpert Xpress SARS-CoV-2/FLU/RSV testing.  Fact Sheet for Patients: EntrepreneurPulse.com.au  Fact Sheet for Healthcare Providers: IncredibleEmployment.be  This test is not yet approved or cleared by the [place] FDA and has been authorized for detection and/or diagnosis of SARS-CoV-2 by FDA under an Emergency Use Authorization (EUA). This EUA will remain in effect (meaning this test can be used) for the duration of the COVID-19 declaration under Section 564(b)(1) of the Act, 21 U.S.C. section 360bbb-3(b)(1), unless the authorization is terminated or revoked.  Performed at [facility], [place] [address], [place], [place] [address]   MRSA Next Gen by PCR, Nasal     Status: Abnormal   Collection Time: 08/26/21  8:59 AM   Specimen: Nasal Mucosa; Nasal Swab  Result Value Ref Range Status   MRSA by PCR Next Gen NEGATIVE (A) NOT DETECTED Final    Comment: Performed at [facility], [place] [address], [place], [place]  [address]      Studies:  MR LUMBAR SPINE WO CONTRAST  Result Date: 08/28/2021 CLINICAL DATA:  Worsening back pain and radiculopathy. History of metastatic gastric cancer EXAM: MRI LUMBAR SPINE WITHOUT CONTRAST TECHNIQUE: Multiplanar, multisequence MR imaging of the lumbar spine was performed. No intravenous contrast was administered. COMPARISON:  08/24/2021 CT scan FINDINGS: Despite efforts by the technologist and patient, motion artifact is present on today's exam and could not be eliminated. This reduces exam sensitivity and specificity. Segmentation: The lowest lumbar type non-rib-bearing vertebra is labeled as L5. Alignment:  No vertebral subluxation is observed. Vertebrae: Mild degenerative endplate findings. Several small hemangiomas are observed. Faintly reduced T1 signal in the marrow although not pathologically reduced, with marrow generally greater intervertebral disc signal on the T1 weighted images. Conus medullaris and cauda equina: Conus extends to the L1-2 level. Conus and cauda equina appear normal. Paraspinal and other soft tissues: There is a new large complex fluid collection in the right iliacus muscle, with internal fluid-fluid level superiorly on image 28 series 12, and a larger inferior component measuring at least 6 cm in diameter and likely substantially larger. This tracks beyond the inferior-most images of today's exam and is associated with abnormal infiltrative edema in the left iliacus muscle. Given that there was no abnormality in this vicinity on 08/24/2021, and given the potential internal hematocrit levels, I tend to favor acute hematoma over iliacus muscle abscess. There is some associated edema tracking along the right retroperitoneum. The patient has known pathologic retroperitoneal adenopathy as on recent CT scan. Disc levels: L1-2: Unremarkable. L2-3: No impingement.  Mild disc bulge. L3-4: No impingement.  Mild disc bulge. L4-5: Moderate central narrowing of the thecal sac  due to disc bulge and ligamentum flavum redundancy. L5-S1: No impingement.  Bilateral degenerative facet arthropathy. IMPRESSION: 1. Large new complex right iliacus muscle fluid collection, rapid onset favoring hematoma over abscess. There is associated edema in the iliacus muscle and along the adjacent right retroperitoneum. 2. Moderate central narrowing of the thecal sac at L4-5 due to disc bulge and ligamentum flavum redundancy. 3. Retroperitoneal adenopathy as shown on recent CT. Electronically Signed   By: Van Clines M.D.   On: 08/28/2021 18:24   MR PELVIS WO CONTRAST  Result Date: 08/28/2021 CLINICAL DATA:  Right groin pain.  Metastatic gastric cancer. EXAM: MRI PELVIS WITHOUT CONTRAST TECHNIQUE: Multiplanar multisequence MR  imaging of the pelvis was performed. No intravenous contrast was administered. COMPARISON:  Radiographs 08/28/2021 and CT pelvis from 08/24/2021 in addition to lumbar MRI from 08/28/2021 FINDINGS: Despite efforts by the technologist and patient, motion artifact is present on today's exam and could not be eliminated. This reduces exam sensitivity and specificity. OSSEOUS STRUCTURES AND JOINTS: No fracture or compelling evidence of regional osseous metastatic disease. No SI joint or hip joint effusion. MUSCULAR STRUCTURES: 13.6 by 6.5 by 5.4 cm (volume = 250 cm^3) right iliacus muscle hematoma with internal hematocrit levels, and associated edema tracking in the iliacus muscle and in the surrounding retroperitoneum and along the right pelvic sidewall. There is also a 6.2 by 2.3 by 2.4 cm (volume = 18 cm^3) left iliacus muscle hematoma with surrounding iliacus muscle edema. Bilateral direct inguinal hernias are present. The left hernia contains part of the proximal sigmoid colon, the right hernia appears to contain a small amount of fluid and adipose tissue currently although previously contained a loop of small bowel. There is trace edema in a symmetric manner along the hip  adductor musculature. Other: Prostate gland grossly unremarkable. Urinary bladder unremarkable. IMPRESSION: 1. Approximally 215 cc right iliacus muscle hematoma. 18 cc left iliacus muscle hematoma. Both are associated with edema in the iliacus muscles. 2. The right iliacus muscle hematoma is associated with edema tracking in the surrounding retroperitoneum and along the right pelvic sidewall and right lateral abdominal wall musculature. 3. Bilateral direct inguinal hernias. The left hernia contains part of the colon in the right hernia contains adipose tissue and a small amount of fluid. Electronically Signed   By: Van Clines M.D.   On: 08/28/2021 19:10   DG HIP UNILAT WITH PELVIS 2-3 VIEWS RIGHT  Result Date: 08/28/2021 CLINICAL DATA:  Severe right-sided hip pain EXAM: DG HIP (WITH OR WITHOUT PELVIS) 2-3V RIGHT COMPARISON:  CT 07/01/2021 FINDINGS: Residual enteral contrast in the colon and rectum. SI joints are non widened. Pubic symphysis and rami appear intact. No definitive fracture or malalignment. IMPRESSION: No acute osseous abnormality Electronically Signed   By: Donavan Foil M.D.   On: 08/28/2021 16:50   VAS [place] LOWER EXTREMITY VENOUS (DVT)  Result Date: 08/29/2021  Lower Venous DVT Study Patient Name:  Billy [name]  Date of Exam:   08/29/2021 Medical Rec #: [id number]       Accession #:    [id number] Date of Birth: May 31, 1941       Patient Gender: M Patient Age:   31 years Exam Location:  [facility] Procedure:      VAS [place] LOWER EXTREMITY VENOUS (DVT) Referring Phys: Christinia Gully --------------------------------------------------------------------------------  Indications: Pain. Other Indications: PE dx'd on 08/25/2021. Risk Factors: CA patient on chemotherapy. Comparison Study: No previous exams Performing Technologist: Jody Hill RVT, RDMS  Examination Guidelines: A complete evaluation includes B-mode imaging, spectral Doppler, color Doppler, and power Doppler as needed of all  accessible portions of each vessel. Bilateral testing is considered an integral part of a complete examination. Limited examinations for reoccurring indications may be performed as noted. The reflux portion of the exam is performed with the patient in reverse Trendelenburg.  +---------+---------------+---------+-----------+----------+-------------------+  RIGHT     Compressibility Phasicity Spontaneity Properties Thrombus Aging       +---------+---------------+---------+-----------+----------+-------------------+  CFV       Full            Yes       Yes                                         +---------+---------------+---------+-----------+----------+-------------------+  SFJ       Full                                                                  +---------+---------------+---------+-----------+----------+-------------------+  FV Prox   Full            Yes       Yes                                         +---------+---------------+---------+-----------+----------+-------------------+  FV Mid    Full            Yes       Yes                                         +---------+---------------+---------+-----------+----------+-------------------+  FV Distal Full            Yes       Yes                                         +---------+---------------+---------+-----------+----------+-------------------+  PFV       Full                                                                  +---------+---------------+---------+-----------+----------+-------------------+  POP       None            No        No                     Acute                +---------+---------------+---------+-----------+----------+-------------------+  PTV       Partial         No        No                     Acute - one of                                                                   paired               +---------+---------------+---------+-----------+----------+-------------------+  PERO      None            No        No                      Acute                +---------+---------------+---------+-----------+----------+-------------------+  Gastroc   None            No        No                     Acute                +---------+---------------+---------+-----------+----------+-------------------+  SSV       Partial         No        No                     Age indeterminate                                                                -origin and prox                                                                 portion              +---------+---------------+---------+-----------+----------+-------------------+   +---------+---------------+---------+-----------+----------+--------------+  LEFT      Compressibility Phasicity Spontaneity Properties Thrombus Aging  +---------+---------------+---------+-----------+----------+--------------+  CFV       Full            Yes       Yes                                    +---------+---------------+---------+-----------+----------+--------------+  SFJ       Full                                                             +---------+---------------+---------+-----------+----------+--------------+  FV Prox   Full            Yes       Yes                                    +---------+---------------+---------+-----------+----------+--------------+  FV Mid    Full            Yes       Yes                                    +---------+---------------+---------+-----------+----------+--------------+  FV Distal Full            Yes       Yes                                    +---------+---------------+---------+-----------+----------+--------------+  PFV       Full                                                             +---------+---------------+---------+-----------+----------+--------------+  POP       Full            Yes       Yes                                    +---------+---------------+---------+-----------+----------+--------------+  PTV       Full                                                              +---------+---------------+---------+-----------+----------+--------------+  PERO      Full                                                             +---------+---------------+---------+-----------+----------+--------------+    Summary: BILATERAL: -No evidence of popliteal cyst, bilaterally. RIGHT: - Findings consistent with acute deep vein thrombosis involving the right popliteal vein, right posterior tibial veins, right peroneal veins, and right gastrocnemius veins. - Findings consistent with age indeterminate superficial vein thrombosis involving the right small saphenous vein.  LEFT: - There is no evidence of deep vein thrombosis in the lower extremity. - There is no evidence of superficial venous thrombosis.  *See table(s) above for measurements and observations.    Preliminary     Assessment: 81 y.o. male   1.  Massive PE, s/p tpa and hepatin with complication of right pelvic hematoma and hypotension, improved now 2. Metastatic gastric cancer, on palliative chemo  3.  Fever, resolved  4.  Maculopapular rash thought to be due to pembrolizumab/trastuzumab 5.  Normocytic anemia and blood loss anemia  6.  Leukocytosis 7.  DNR   Plan:  -due to anticoagulation related hematoma and bleeding, heparin has been stopped, he is going to have IVC filter placed.  I discussed that the filter does not help the blood clots he has in his right lung, but will prevent any future DVT in legs traveling to lungs  -I may put him on prophylactic dose anticoagulation in about 2 weeks if he is stable  -He has a lot of questions about his recovery, I encouraged him to participate in physical therapy -We will hold on chemotherapy for the next 2 to 4 weeks.  We will reevaluate his candidacy for chemotherapy.  If he is able to recover reasonably well enough to be a candidate for further treatment, I will likely do low-dose chemo with Xeloda, along with trastuzumab and Beryle Flock -he is DNR now  -I will f/u  as needed before his discharge, will arrange his F/u with me in 2 weeks   Truitt Merle, MD 08/29/2021  5:17 PM

## 2021-08-29 NOTE — Telephone Encounter (Signed)
Left message with follow-up appointment per 1/19 los.

## 2021-08-29 NOTE — TOC Initial Note (Signed)
Transition of Care [name] - Initial/Assessment Note   Patient Details  Name: Billy [name] MRN: [id number] Date of Birth: 10/14/1940  Transition of Care [facility]) CM/SW Contact:    Sherie Don, LCSW Phone Number: 08/29/2021, 1:50 PM  Clinical Narrative: Readmission checklist completed due to high readmission score. CSW spoke with wife, Billy [name], to complete assessment. Per wife, patient resides at home with her. Patient was independent with ADLs at baseline and does not have any history of [place] services. Patient does not have or use any DME at home. Patient is able to afford his monthly medications. Wife has been assisting with transportation to medical appointments since November 2022. TOC to follow for discharge needs.  Expected Discharge Plan: Home/Self Care Barriers to Discharge: Continued Medical Work up  Patient Goals and CMS Choice Patient states their goals for this hospitalization and ongoing recovery are:: Return home with wife  Expected Discharge Plan and Services Expected Discharge Plan: Home/Self Care In-house Referral: Clinical Social Work Living arrangements for the past 2 months: Single Family Home           DME Arranged: N/A DME Agency: NA  Prior Living Arrangements/Services Living arrangements for the past 2 months: [place] Lives with:: Spouse Patient language and need for interpreter reviewed:: Yes Do you feel safe going back to the place where you live?: Yes      Need for Family Participation in Patient Care: Yes (Comment) Care giver support system in place?: Yes (comment) Criminal Activity/Legal Involvement Pertinent to Current Situation/Hospitalization: No - Comment as needed  Emotional Assessment Orientation: : Oriented to Self, Oriented to Place, Oriented to  Time, Oriented to Situation Alcohol / Substance Use: Not Applicable Psych Involvement: No (comment)  Admission diagnosis:  Lactic acid increased [E87.20] SOB (shortness of breath)  [R06.02] Elevated troponin [R77.8] Pulmonary embolus, right ([place]) [I26.99] Other pulmonary embolism without acute cor pulmonale, unspecified chronicity (HCC) [I26.99] Patient Active Problem List   Diagnosis Date Noted   Hematoma of iliopsoas muscle, initial encounter 08/28/2021   Shock circulatory ([place]) 08/28/2021   Pulmonary embolus, right ([place]) 08/26/2021   Neutropenia with fever ([place]) 08/26/2021   Port-A-Cath in place 08/25/2021   Acute pulmonary embolus ([place]) 08/25/2021   Gastric cancer ([place]) 07/21/2021   Liver masses 07/07/2021   Multiple lesions of metastatic malignancy ([place]) 07/07/2021   Anxiety 07/07/2021   Vitamin D deficiency 08/03/2020   Hyperglycemia 07/25/2019   Bowel habit changes 08/13/2017   Preventative health care 07/20/2017   Medicare annual wellness visit, subsequent 07/11/2016   Cough 04/18/2016   Stye 03/13/2016   Elevated BP 03/13/2016   Plantar fasciitis of right foot 02/16/2016   Chronotropic incompetence 11/12/2015   DOE (dyspnea on exertion) 11/12/2015   Coronary artery disease involving native coronary artery of native heart without angina pectoris 09/22/2015   S/P drug eluting coronary stent placement 09/22/2015   Erectile dysfunction 07/27/2015   Former smoker 05/21/2014   Gout 07/24/2012   Abnormal finding on EKG 11/21/2010   Mixed hyperlipidemia 04/20/2008   History of skin cancer 04/20/2008   POLYP, COLON 12/23/2007   Internal hemorrhoids 12/23/2007   PCP:  Biagio Borg, MD Pharmacy:   [id number] [address], [place] [address] Phone: [phone] Fax: Mar[facility] Colon [address] [place] [address] Phone: [phone] Fax: [phone]  Readmission Risk Interventions Readmission Risk Prevention Plan 08/29/2021  Transportation Screening Complete  HRI or Home Care  Consult Complete  Social Work Consult for [place] Planning/Counseling Complete   Palliative Care Screening Complete  Medication Review Press photographer) Complete  Some recent data might be hidden

## 2021-08-30 ENCOUNTER — Inpatient Hospital Stay (HOSPITAL_COMMUNITY): Payer: Medicare HMO

## 2021-08-30 DIAGNOSIS — D62 Acute posthemorrhagic anemia: Secondary | ICD-10-CM

## 2021-08-30 DIAGNOSIS — I82431 Acute embolism and thrombosis of right popliteal vein: Secondary | ICD-10-CM

## 2021-08-30 DIAGNOSIS — R21 Rash and other nonspecific skin eruption: Secondary | ICD-10-CM

## 2021-08-30 DIAGNOSIS — R29898 Other symptoms and signs involving the musculoskeletal system: Secondary | ICD-10-CM

## 2021-08-30 LAB — CBC
HCT: 24.3 % — ABNORMAL LOW (ref 39.0–52.0)
Hemoglobin: 7.8 g/dL — ABNORMAL LOW (ref 13.0–17.0)
MCH: 30.5 pg (ref 26.0–34.0)
MCHC: 32.1 g/dL (ref 30.0–36.0)
MCV: 94.9 fL (ref 80.0–100.0)
Platelets: 204 10*3/uL (ref 150–400)
RBC: 2.56 MIL/uL — ABNORMAL LOW (ref 4.22–5.81)
RDW: 14.2 % (ref 11.5–15.5)
WBC: 7.3 10*3/uL (ref 4.0–10.5)
nRBC: 0 % (ref 0.0–0.2)

## 2021-08-30 LAB — CULTURE, BLOOD (ROUTINE X 2)
Culture: NO GROWTH
Culture: NO GROWTH

## 2021-08-30 IMAGING — MR MR HEAD W/O CM
10 series · 48 of 48 positions shown · non-contrast
Comparison: None.

CLINICAL DATA: Neuro deficit, stroke suspected

EXAM:
MRI HEAD WITHOUT CONTRAST
TECHNIQUE: Multiplanar, multiecho pulse sequences of the brain and surrounding
structures were obtained without intravenous contrast.

[Series 5: DWI · axial · 3.0mm · 1.36mm/px · z∈[-26,+107]mm · 9 of 96 slices shown (1 of 2)]
[im 1/96]
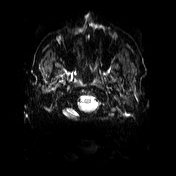
[im 12/96]
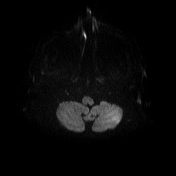
[im 24/96]
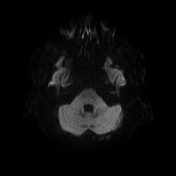
[im 36/96]
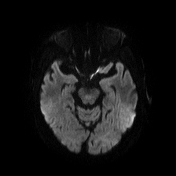
[im 48/96]
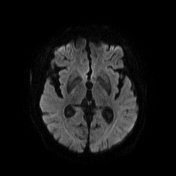
[im 60/96]
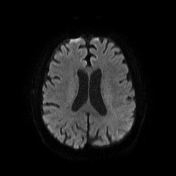
[im 72/96]
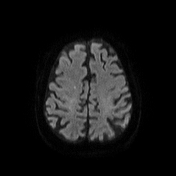
[im 84/96]
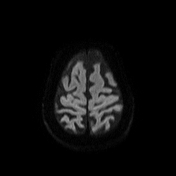
[im 96/96]
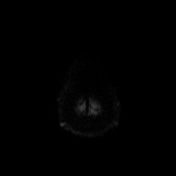

[Series 6: DWI · axial · 3.0mm · 1.36mm/px · z∈[-26,+107]mm · 4 of 48 slices shown (2 of 2)]
[im 1/48]
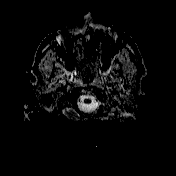
[im 16/48]
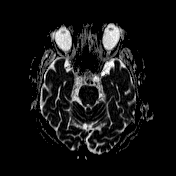
[im 32/48]
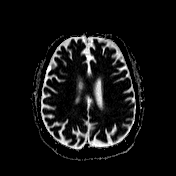
[im 48/48]
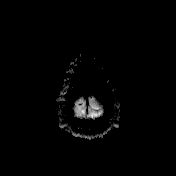

[Series 7: T1 · sagittal · 5.0mm · 0.75mm/px · 2 of 25 slices shown (1 of 2)]
[im 1/25]
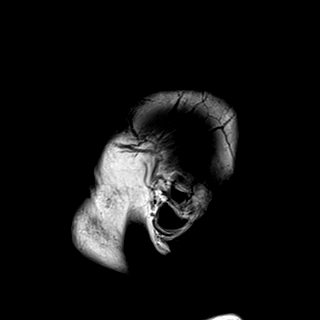
[im 25/25]
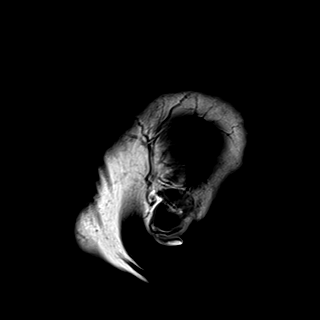

[Series 8: T2 · axial · 5.0mm · 0.62mm/px · z∈[-33,+115]mm · 2 of 25 slices shown (1 of 2)]
[im 1/25]
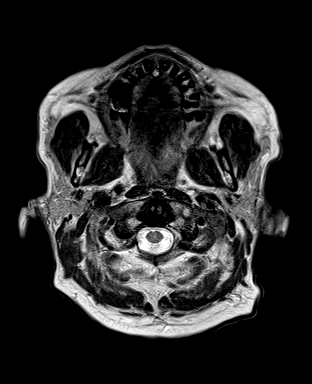
[im 25/25]
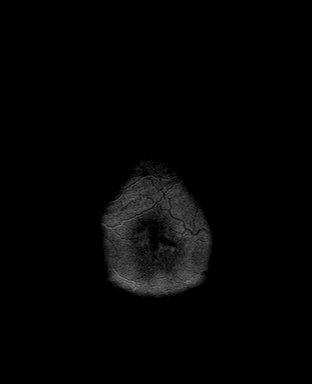

[Series 9: swi_images · axial · 3.0mm · 0.75mm/px · z∈[-37,+119]mm · 5 of 56 slices shown]
[im 1/56]
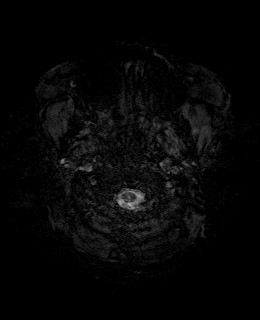
[im 14/56]
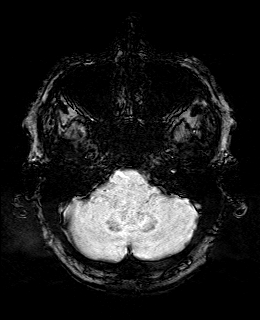
[im 28/56]
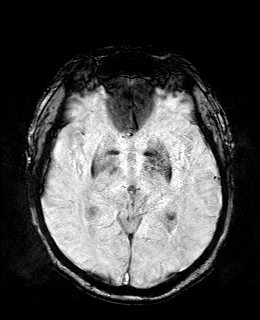
[im 42/56]
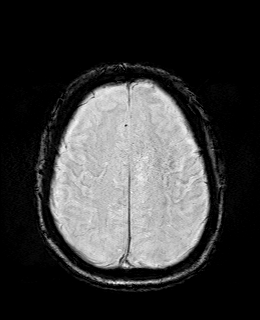
[im 56/56]
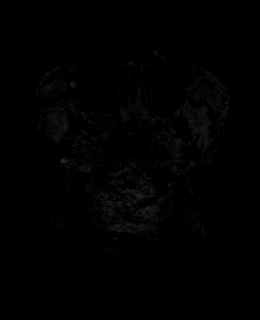

[Series 11: FLAIR · axial · 3.0mm · 0.75mm/px · z∈[-31,+113]mm · 4 of 52 slices shown]
[im 1/52]
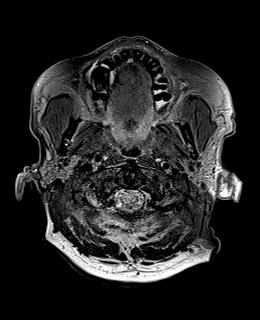
[im 18/52]
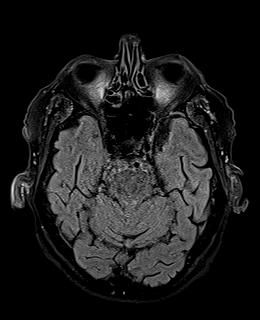
[im 35/52]
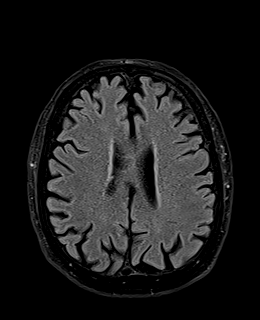
[im 52/52]
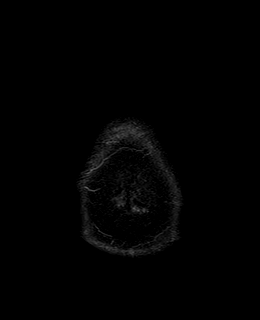

[Series 12: T1 · axial · 1.0mm · 0.94mm/px · z∈[-34,+116]mm · 13 of 160 slices shown (2 of 2)]
[im 1/160]
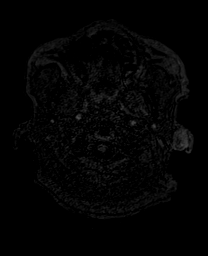
[im 14/160]
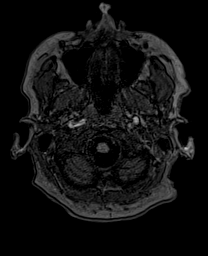
[im 27/160]
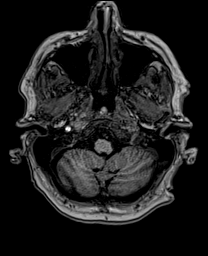
[im 40/160]
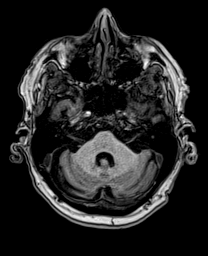
[im 54/160]
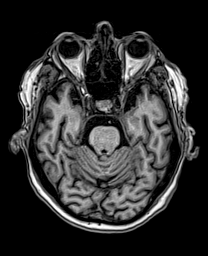
[im 67/160]
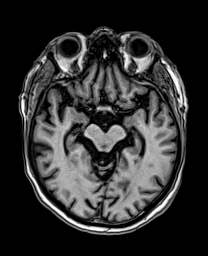
[im 80/160]
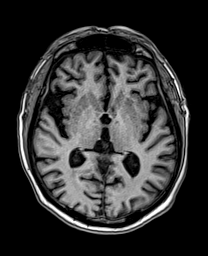
[im 93/160]
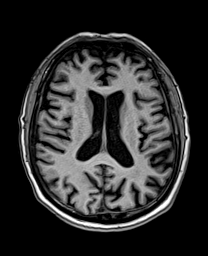
[im 107/160]
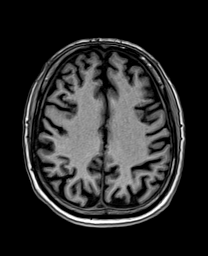
[im 120/160]
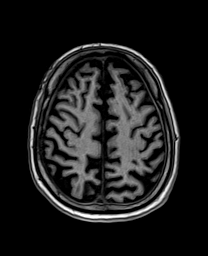
[im 133/160]
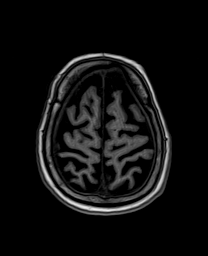
[im 146/160]
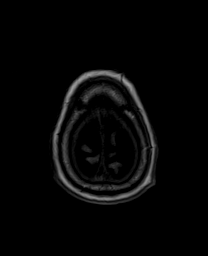
[im 160/160]
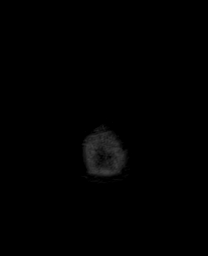

[Series 13: cor dwi_tracew · coronal · 5.0mm · 1.53mm/px · 5 of 54 slices shown]
[im 1/54]
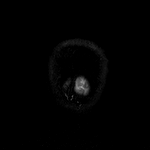
[im 14/54]
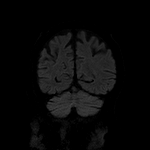
[im 27/54]
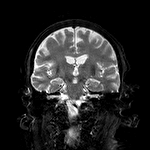
[im 40/54]
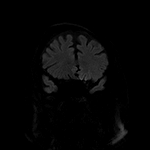
[im 54/54]
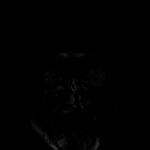

[Series 14: cor dwi_adc · coronal · 5.0mm · 1.53mm/px · 2 of 27 slices shown]
[im 1/27]
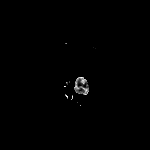
[im 27/27]
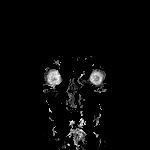

[Series 15: T2 · coronal · 5.0mm · 0.69mm/px · 2 of 27 slices shown (2 of 2)]
[im 1/27]
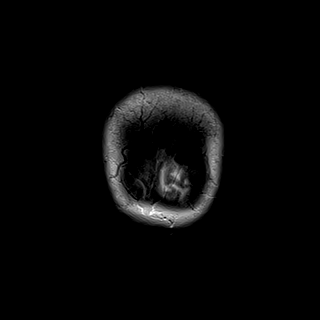
[im 27/27]
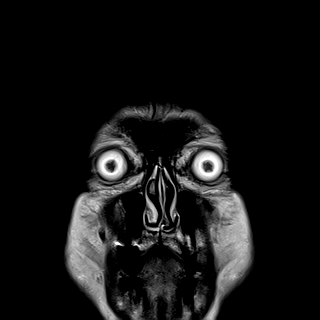

[48 of 48 positions shown; findings below may reference images not displayed]

FINDINGS: Brain: No definite restricted diffusion with ADC correlate to
suggest acute or subacute infarct. Punctate area of increased signal
on diffusion-weighted imaging in the right corona radiata, without
ADC correlate (series 5, image 85), does correlate with a focal area
of increased T2 signal and may represent late subacute infarct with
ADC normalization versus T2 shine through.

No acute hemorrhage, mass, mass effect, or midline shift. Minimal T2
hyperintense signal in the periventricular white matter, likely the
sequela of very mild chronic small vessel ischemic disease. No foci
of hemosiderin deposition to suggest remote hemorrhage. No
hydrocephalus or extra-axial collection.

Vascular: Normal flow voids.

Skull and upper cervical spine: Normal marrow signal.

Sinuses/Orbits: Negative.

Other: The mastoids are well aerated.
IMPRESSION: 1. Possible punctate area of increased signal on diffusion-weighted
imaging without ADC correlate, which may represent a late subacute
infarct versus T2 shine through.
2. No acute intracranial process.

## 2021-08-30 MED ORDER — DEXAMETHASONE 4 MG PO TABS
10.0000 mg | ORAL_TABLET | Freq: Every day | ORAL | Status: AC
Start: 2021-08-31 — End: 2021-09-01
  Administered 2021-08-31 – 2021-09-01 (×2): 10 mg via ORAL
  Filled 2021-08-30: qty 3
  Filled 2021-08-30: qty 5

## 2021-08-30 MED ORDER — DEXAMETHASONE 4 MG PO TABS
6.0000 mg | ORAL_TABLET | Freq: Every day | ORAL | Status: AC
  Administered 2021-09-02 – 2021-09-03 (×2): 6 mg via ORAL
  Filled 2021-08-30 (×2): qty 2

## 2021-08-30 NOTE — Assessment & Plan Note (Addendum)
Present prior to admission,  likely due to pembrolizumab and trastuzumab, had been on dexamethasone taper - COntinue dexamethasone taper

## 2021-08-30 NOTE — Assessment & Plan Note (Addendum)
-  Hold aspirin, lisinopril -Continue atorvastatin

## 2021-08-30 NOTE — Assessment & Plan Note (Addendum)
Onset 1/22.  Heparin d/cd that afternoon.  Causing right leg weakness.

## 2021-08-30 NOTE — Hospital Course (Addendum)
Billy [name] is an 81 y.o. M with gastric CA metastatic to lung, liver, and retroperitoneal/hilar lymph nodes on palliative chemo first dx'd Nov 2022, CAD s/p PCI 2017 who presented with SOB, found to have PE.   1/19 Admitted on heparin 1/20 syncopal event, hypoxia increased, developed hypotension CCM consulted, started Levophed --> underwent tPA 1/22: developed thigh hematoma post-tPA, heparin stopped 1/23 IVC filter placed, transferred to [facility] 1/24 MRI brain without stroke

## 2021-08-30 NOTE — Assessment & Plan Note (Addendum)
Continue statin. 

## 2021-08-30 NOTE — Assessment & Plan Note (Addendum)
Completed about 72 hrs heparin prior to being d/c'd.  IVC filter placed -Hold heparin   - At d/c, he will follow up with Dr. Burr Medico in 1-2 weeks, she will resume DOAC if safe

## 2021-08-30 NOTE — Progress Notes (Signed)
Progress Note   Patient: Billy [name] [id number]:[id number] DOB: [birth year] DOA: 08/25/2021     4 DOS: the patient was seen and examined on 08/30/2021   Brief hospital course: Mr. Billy [name] is an 81 y.o. M with gastric CA metastatic to lung, liver, and retroperitoneal/hilar lymph nodes on palliative chemo first dx'd Nov 2022, CAD s/p PCI 2017 who presented with SOB, found to have PE.   1/19 Admitted on heparin 1/20 syncopal event, hypoxia increased, developed hypotension CCM consulted, started Levophed --> underwent tPA 1/22: developed thigh hematoma post-tPA, heparin stopped 1/23 IVC filter placed, transferred to [facility]   Assessment and Plan * Shock circulatory ([place])- (present on admission) Cardiogenic shock developed shortly after admission due to massive PE.  Underwent lysis with tPA, was on Levophed, now weaned off and appears hemodynamically stable.  Acute deep vein thrombosis (DVT) of popliteal vein of right lower extremity (HCC)    Acute pulmonary embolus (HCC)- (present on admission) Completed about 72 hrs heparin prior to being d/c'd.   -Hold heparin given acute blood loss anemia/thigh hematoma - IVC filter in place  Acute blood loss anemia Patient developed new anemia down to 7.8 g/dL after heparin and tPA   Hgb basleine ~10 at admission -Hold heparin for now - Oncology have suggested starting "prophylactic dose" anticoagulation in 2 weeks  Hematoma of iliopsoas muscle, initial encounter Onset 1/22.  Heparin d/cd that afternoon.  Right leg weakness Maybe from compression due to iliac hematoma.  Need to rule out stroke  Patient thinks this has been present "a few days", daughter actually noticed it several days before admission  - MRI brain ordered  Gastric cancer [facility])- (present on admission) With metastases to liver, lungs, lymph nodes. - Consult Oncology, will need follow up with Dr. Burr Medico within 2 weeks - Consult palliative care  -Continue mirtazapine for  appetite  Rash Present prior to admission,  likely due to pembrolizumab and trastuzumab, had been on dexamethasone taper - Taper dex  Coronary artery disease involving native coronary artery of native heart without angina pectoris- (present on admission) -Hold aspirin, atorvastatin, lisinopril  Mixed hyperlipidemia- (present on admission) -Hold statin     Subjective: Patient still has weakness and numbness in the right lower extremity, basically from the knee down it is numb completely, and he has some weakness, mostly with flexion of the hip, extension of the knee.  No fever, chest pain, respiratory distress, confusion.  Objective Vital signs were reviewed and notable only for soft blood pressure. Elderly adult male, sitting in bed, interactive, attention normal, affect appropriate, judgment and insight appear normal.  He has numbness of the right lower extremity from the knee down.  Normal sensation on the left.  He has no swelling or edema or redness of the leg.  He also has weakness of hip flexion, knee extension, ankle strength seems fairly normal.  Heart rate regular, no systolic murmurs, no lower extremity edema.  Respiratory rate normal, lungs clear without rales or wheezes.  Data Reviewed: Discussed with critical care. Patient metabolic panel notable for normal creatinine and electrolytes.  MRI of the pelvis shows right iliac is hematoma.  Complete blood count notable for hemoglobin review down to 7.8.  Ultrasound of the right lower extremity shows DVT from the popliteal vein down   Family Communication: Daughter Billy [name] by phone  Disposition: Status is: Inpatient  Remains inpatient appropriate because: He requires ongoing work-up and management of his massive PE requiring tPA.  PT eval, possibly HH, more likely  SNF in 2-3           Author: Edwin Dada, MD 08/30/2021 3:32 PM  For on call review [email or web address].

## 2021-08-30 NOTE — Assessment & Plan Note (Addendum)
With metastases to liver, lungs, lymph nodes.  - Consult Oncology, will need follow up with Dr. Burr Medico within 2 weeks - Consult palliative care  -Continue MS contin -Continue mirtazapine for appetite  -Hold capecitabine

## 2021-08-30 NOTE — Assessment & Plan Note (Addendum)
MRI brain ruled out stroke.  Distribution of femoral nerve, of L4 nerve root, based on imaging showing iliacus hematoma, probably it's compression of that L4 branch as it passes through the pelvis.  Discussed with Ortho, Dr. Lynann Bologna, surgical intervention by Trauma would be unlikely to improve this faster and would have high complication rate.  I suspect this will resolve slowly over weeks - PT

## 2021-08-30 NOTE — Assessment & Plan Note (Addendum)
Baseline ~10 at admission, post tPA and heparin, has steadily drifted down 8.6 g/dL today Iron sats very low, retic index very low  - Hold heparin - IV iron

## 2021-08-30 NOTE — Assessment & Plan Note (Addendum)
Cardiogenic shock developed shortly after admission due to massive PE.  Underwent lysis with tPA, was on Levophed, now weaned off and has remained hemodynamically stable.

## 2021-08-31 DIAGNOSIS — I2510 Atherosclerotic heart disease of native coronary artery without angina pectoris: Secondary | ICD-10-CM

## 2021-08-31 LAB — CBC
HCT: 23.7 % — ABNORMAL LOW (ref 39.0–52.0)
HCT: 26.3 % — ABNORMAL LOW (ref 39.0–52.0)
Hemoglobin: 7.6 g/dL — ABNORMAL LOW (ref 13.0–17.0)
Hemoglobin: 8.6 g/dL — ABNORMAL LOW (ref 13.0–17.0)
MCH: 30.3 pg (ref 26.0–34.0)
MCH: 30.6 pg (ref 26.0–34.0)
MCHC: 32.1 g/dL (ref 30.0–36.0)
MCHC: 32.7 g/dL (ref 30.0–36.0)
MCV: 94.4 fL (ref 80.0–100.0)
MCV: 93.6 fL (ref 80.0–100.0)
Platelets: 221 10*3/uL (ref 150–400)
Platelets: 272 10*3/uL (ref 150–400)
RBC: 2.51 MIL/uL — ABNORMAL LOW (ref 4.22–5.81)
RBC: 2.81 MIL/uL — ABNORMAL LOW (ref 4.22–5.81)
RDW: 14.1 % (ref 11.5–15.5)
RDW: 13.9 % (ref 11.5–15.5)
WBC: 12.6 10*3/uL — ABNORMAL HIGH (ref 4.0–10.5)
WBC: 16.2 10*3/uL — ABNORMAL HIGH (ref 4.0–10.5)
nRBC: 0 % (ref 0.0–0.2)
nRBC: 0 % (ref 0.0–0.2)

## 2021-08-31 LAB — IRON AND TIBC
Iron: 27 ug/dL — ABNORMAL LOW (ref 45–182)
Saturation Ratios: 9 % — ABNORMAL LOW (ref 17.9–39.5)
TIBC: 307 ug/dL (ref 250–450)
UIBC: 280 ug/dL

## 2021-08-31 LAB — FOLATE: Folate: 31.5 ng/mL (ref >5.9)

## 2021-08-31 LAB — BASIC METABOLIC PANEL
Anion gap: 5 (ref 5–15)
Anion gap: 7 (ref 5–15)
BUN: 32 mg/dL — ABNORMAL HIGH (ref 8–23)
BUN: 34 mg/dL — ABNORMAL HIGH (ref 8–23)
CO2: 26 mmol/L (ref 22–32)
CO2: 25 mmol/L (ref 22–32)
Calcium: 8 mg/dL — ABNORMAL LOW (ref 8.9–10.3)
Calcium: 8.2 mg/dL — ABNORMAL LOW (ref 8.9–10.3)
Chloride: 105 mmol/L (ref 98–111)
Chloride: 102 mmol/L (ref 98–111)
Creatinine, Ser: 0.67 mg/dL (ref 0.61–1.24)
Creatinine, Ser: 0.8 mg/dL (ref 0.61–1.24)
GFR, Estimated: >60 mL/min (ref >60)
GFR, Estimated: >60 mL/min (ref >60)
Glucose, Bld: 90 mg/dL (ref 70–99)
Glucose, Bld: 126 mg/dL — ABNORMAL HIGH (ref 70–99)
Potassium: 4.1 mmol/L (ref 3.5–5.1)
Potassium: 4.6 mmol/L (ref 3.5–5.1)
Sodium: 136 mmol/L (ref 135–145)
Sodium: 134 mmol/L — ABNORMAL LOW (ref 135–145)

## 2021-08-31 LAB — LIPID PANEL
Cholesterol: 100 mg/dL (ref 0–200)
HDL: 29 mg/dL — ABNORMAL LOW (ref >40)
LDL Cholesterol: 57 mg/dL (ref 0–99)
Total CHOL/HDL Ratio: 3.4 RATIO
Triglycerides: 71 mg/dL (ref <150)
VLDL: 14 mg/dL (ref 0–40)

## 2021-08-31 LAB — FERRITIN: Ferritin: 636 ng/mL — ABNORMAL HIGH (ref 24–336)

## 2021-08-31 LAB — RETICULOCYTES
Immature Retic Fract: 14.9 % (ref 2.3–15.9)
RBC.: 2.96 MIL/uL — ABNORMAL LOW (ref 4.22–5.81)
Retic Count, Absolute: 17.5 10*3/uL — ABNORMAL LOW (ref 19.0–186.0)
Retic Ct Pct: 0.6 % (ref 0.4–3.1)

## 2021-08-31 LAB — VITAMIN B12: Vitamin B-12: 4277 pg/mL — ABNORMAL HIGH (ref 180–914)

## 2021-08-31 NOTE — Progress Notes (Signed)
Orthopedic Tech Progress Note Patient Details:  Billy [name] [birth year] [id number]  Patient ID: Billy [name], male   DOB: [birth year], 81 y.o.   MRN: [id number]  Billy [name] 08/31/2021, 4:13 PM Knee immobilizer delivered to patients room for use with PT

## 2021-08-31 NOTE — Progress Notes (Signed)
Progress Note   Patient: Billy [name] [id number] DOB: Jul 16, 1941 DOA: 08/25/2021     5 DOS: the patient was seen and examined on 08/31/2021       Brief hospital course: [name] is an 81 y.o. M with gastric CA metastatic to lung, liver, and retroperitoneal/hilar lymph nodes on palliative chemo first dx'd Nov 2022, CAD s/p PCI 2017 who presented with SOB, found to have PE.   1/19 Admitted on heparin 1/20 syncopal event, hypoxia increased, developed hypotension CCM consulted, started Levophed --> underwent tPA 1/22: developed thigh hematoma post-tPA, heparin stopped 1/23 IVC filter placed, transferred to [facility] 1/24 MRI brain without stroke      Assessment and Plan * Shock circulatory ([place])- (present on admission) Cardiogenic shock developed shortly after admission due to massive PE.  Underwent lysis with tPA, was on Levophed, now weaned off and has remained hemodynamically stable.    Acute deep vein thrombosis (DVT) of popliteal vein of right lower extremity (HCC)  Acute pulmonary embolus ([place])- (present on admission) Completed about 72 hrs heparin prior to being d/c'd.  IVC filter placed -Hold heparin for now    Acute blood loss anemia Baseline ~10 at admission, post tPA and heparin, has steadily drifted down 7.6 g/dL today  -Hold heparin - Check iron studies, retics  - Oncology have suggested starting "prophylactic dose" anticoagulation in 2 weeks    Hematoma of iliopsoas muscle, initial encounter Onset 1/22.  Heparin d/cd that afternoon.    Right leg weakness Hip flexion and knee extension weak.  Regional paresthesias in knee area, more lateral, sparing foot.    Patient thinks this has been present "a few days", daughter actually noticed it several days before admission MRI brain ruled out stroke     Gastric cancer [facility])- (present on admission) With metastases to liver, lungs, lymph nodes.  - Consult Oncology, will need follow up with Dr. Burr Medico  within 2 weeks - Consult palliative care  -Continue MS contin -Continue mirtazapine for appetite  -Hold capecitabine  Rash Present prior to admission,  likely due to pembrolizumab and trastuzumab, had been on dexamethasone taper - COntinue dexamethasone taper  Coronary artery disease involving native coronary artery of native heart without angina pectoris- (present on admission) -Hold aspirin, lisinopril -Continue atorvastatin  Mixed hyperlipidemia- (present on admission) -Continue statin          Subjective: Still has right leg weakness and numbness although the numbness is improving.  No new fever, cough, chest pain, dyspnea.  Objective Vital signs were reviewed and unremarkable. Elderly adult male, lying in bed, no acute distress.  He has 2/5 weakness of hip flexion on the right, normal hip extension.  He has 2/5 weakness of right knee extension, knee flexion seems normal, ankle strength normal.  Left leg strength normal in all joints.  Paresthesias around the right knee, medial and lateral, less so on the foot.  Heart rate regular, no murmurs, no lower extremity edema, normal respiratory rate and rhythm, lungs clear without rales or wheezes, attention normal, affect normal, judgment [name] normal.    Data Reviewed: My review of labs and imaging is notable for hemoglobin down to 7.6, complete blood count also showing white blood cells up to 12.  Patient metabolic panel with normal electrolytes and renal function.  MRI brain report reviewed, shows artifact in the right corona radiata, no left-sided lesions to correlate with his right-sided symptoms.    Family Communication:   Disposition: Status is: Inpatient  Remains inpatient appropriate  because: The patient will need significant rehabilitation return to his prior level of function.  We will need PT evaluation, monitoring of his hemoglobin.  If he is safe to go home with home health PT, and hemoglobin stabilizes,  will likely be able to discharge within the next day            Author: Edwin Dada, MD 08/31/2021 9:58 AM  For on call review [email or web address]

## 2021-08-31 NOTE — Evaluation (Signed)
Physical Therapy Evaluation Patient Details Name: Billy [name] MRN: [id number] DOB: 04/24/1941 Today's Date: 08/31/2021  History of Present Illness  Mr. Tooker is an 81 y.o. M with gastric CA metastatic to lung, liver, and retroperitoneal/hilar lymph nodes on palliative chemo first dx'd Nov 2022, CAD s/p PCI 2017 who presented with SOB, found to have PE. 1/20 syncopal event, hypoxia increased, developed hypotension CCM consulted, started Levophed --> underwent tPA  1/22; developed thigh hematoma post-tPA, heparin stopped  1/23 IVC filter placed. 1/24 MRI brain without stroke  Clinical Impression  Patient presents with significant  loss of  strength of  right quads and hip flexion with abnormal sensation about the groin, lateral hip area and thigh. The lower leg strength and sensation is preserved. Patient ambulates with "Locking" right knee and cautioned that the knee can buckle quickly. Patient relies on the RW. Provided KI  and ambulated with much safer and less concern for buckling on level  surfaces. to be used for practice on steps next visit and longer ambulation distances.  Patient asking many questions about prognosis for right leg strength recovery.  Deferred him for discussion with MD. Patient lives in a multilevel home so   will need to be safe for negotiating steps.  Patient may benefit from OPPT.  Pt admitted with above diagnosis.  Pt currently with functional limitations due to the deficits listed below (see PT Problem List). Pt will benefit from skilled PT to increase their independence and safety with mobility to allow discharge to the venue listed below.        Recommendations for follow up therapy are one component of a multi-disciplinary discharge planning process, led by the attending physician.  Recommendations may be updated based on patient status, additional functional criteria and insurance authorization.  Follow Up Recommendations Outpatient PT    Assistance  Recommended at Discharge Intermittent Supervision/Assistance  Patient can return home with the following  A little help with walking and/or transfers;Help with stairs or ramp for entrance;A little help with bathing/dressing/bathroom    Equipment Recommendations Rolling walker (2 wheels)  Recommendations for Other Services       Functional Status Assessment Patient has had a recent decline in their functional status and demonstrates the ability to make significant improvements in function in a reasonable and predictable amount of time.     Precautions / Restrictions Precautions Precautions: Fall Precaution Comments: right knee will buckle , quads are absent Required Braces or Orthoses: Knee Immobilizer - Right Knee Immobilizer - Right: On when out of bed or walking (on stairs.)      Mobility  Bed Mobility               General bed mobility comments: in recliner    Transfers Overall transfer level: Needs assistance Equipment used: Rolling walker (2 wheels) Transfers: Sit to/from Stand Sit to Stand: Min assist           General transfer comment: cues for safety and caution of  right kne buckling    Ambulation/Gait Ambulation/Gait assistance: Min assist Gait Distance (Feet): 50 Feet Assistive device: Rolling walker (2 wheels) Gait Pattern/deviations: Step-to pattern       General Gait Details: PT quarding right knee, patient noted to ":lock": the knee in stance. Placed KI and patient  with less knee lock and more  normal swing and stance  Stairs            Wheelchair Mobility    Modified Rankin (Stroke Patients Only)  Balance Overall balance assessment: Needs assistance Sitting-balance support: No upper extremity supported, Feet supported Sitting balance-Leahy Scale: Good     Standing balance support: Bilateral upper extremity supported, Reliant on assistive device for balance, During functional activity Standing balance-Leahy Scale: Poor                                Pertinent Vitals/Pain Pain Assessment Pain Assessment: Faces Faces Pain Scale: Hurts even more Pain Location: right thigh and groin, "bees " crawling on thigh Pain Descriptors / Indicators: Discomfort, Nagging Pain Intervention(s): Monitored during session, Premedicated before session    Home Living Family/patient expects to be discharged to:: Private residence Living Arrangements: Spouse/significant other Available Help at Discharge: Family;Available 24 hours/day Type of Home: House Home Access: Stairs to enter   Entrance Stairs-Number of Steps: 3 Alternate Level Stairs-Number of Steps: 7 Home Layout: Multi-level;Bed/bath upstairs Home Equipment: Crutches;BSC/3in1 Additional Comments: needs new RW    Prior Function Prior Level of Function : Independent/Modified Independent                     Hand Dominance        Extremity/Trunk Assessment   Upper Extremity Assessment Upper Extremity Assessment: Overall WFL for tasks assessed    Lower Extremity Assessment Lower Extremity Assessment: RLE deficits/detail RLE Deficits / Details: no quad activity, ankle  dorsiflexion  and plantar flexion and senastion preserved, Hanstring WNL, Hip flex 1/5 RLE Sensation: decreased light touch    Cervical / Trunk Assessment Cervical / Trunk Assessment: Normal  Communication   Communication: No difficulties  Cognition Arousal/Alertness: Awake/alert Behavior During Therapy: WFL for tasks assessed/performed Overall Cognitive Status: Within Functional Limits for tasks assessed                                 General Comments: anxious about the weakness in right leg and sensory changes and  the whole turn of vents since cancer diagnosed.        General Comments      Exercises     Assessment/Plan    PT Assessment Patient needs continued PT services  PT Problem List Decreased strength;Decreased balance;Decreased  knowledge of precautions;Pain;Decreased mobility;Decreased activity tolerance;Decreased safety awareness;Impaired sensation       PT Treatment Interventions DME instruction;Therapeutic activities;Gait training;Therapeutic exercise;Patient/family education;Stair training;Functional mobility training    PT Goals (Current goals can be found in the Care Plan section)  Acute Rehab PT Goals Patient Stated Goal: to get my leg strength back PT Goal Formulation: With patient/family Time For Goal Achievement: 09/14/21 Potential to Achieve Goals: Good    Frequency Min 3X/week     Co-evaluation               AM-PAC PT "6 Clicks" Mobility  Outcome Measure Help needed turning from your back to your side while in a flat bed without using bedrails?: None Help needed moving from lying on your back to sitting on the side of a flat bed without using bedrails?: None Help needed moving to and from a bed to a chair (including a wheelchair)?: A Little Help needed standing up from a chair using your arms (e.g., wheelchair or bedside chair)?: A Little Help needed to walk in hospital room?: A Little Help needed climbing 3-5 steps with a railing? : A Lot 6 Click Score: 19    End of Session  Equipment Utilized During Treatment: Gait belt;Right knee immobilizer Activity Tolerance: Patient tolerated treatment well Patient left: in chair;with call bell/phone within reach;with family/visitor present Nurse Communication: Mobility status PT Visit Diagnosis: Unsteadiness on feet (R26.81);Muscle weakness (generalized) (M62.81);Difficulty in walking, not elsewhere classified (R26.2);Pain;Other symptoms and signs involving the nervous system (R29.898) Pain - Right/Left: Right Pain - part of body: Leg    Time: 9373-4287 PT Time Calculation (min) (ACUTE ONLY): 62 min   Charges:   PT Evaluation $PT Eval Moderate Complexity: 1 Mod PT Treatments $Gait Training: 23-37 mins $Self Care/Home Management: 8-22         ,Tresa Endo PT Acute Rehabilitation Services Pager [phone] Office [phone]   Claretha Cooper 08/31/2021, 5:16 PM

## 2021-08-31 NOTE — Progress Notes (Signed)
Report called to Mammie Russian RN. All questions answered at this time. Patient belongings and paper chart transported with patient. Patient was transported in the wheelchair on tele by this RN. Check in completed. 5E will continue to care for pt.

## 2021-09-01 MED ORDER — SODIUM CHLORIDE 0.9 % IV SOLN
125.0000 mg | Freq: Once | INTRAVENOUS | Status: AC
  Administered 2021-09-01: 125 mg via INTRAVENOUS
  Filled 2021-09-01: qty 10

## 2021-09-01 NOTE — Progress Notes (Signed)
Physical Therapy Treatment Patient Details Name: Billy [name] MRN: [id number] DOB: May 05, 1941 Today's Date: 09/01/2021   History of Present Illness Billy [name] is an 81 y.o. M with gastric CA metastatic to lung, liver, and retroperitoneal/hilar lymph nodes on palliative chemo first dx'd Nov 2022, CAD s/p PCI 2017 who presented with SOB, found to have PE. 1/20 syncopal event, hypoxia increased, developed hypotension CCM consulted, started Levophed --> underwent tPA  1/22; developed thigh hematoma post-tPA, heparin stopped  1/23 IVC filter placed. 1/24 MRI brain without stroke    PT Comments    The patient and wife practiced stairs using right crutch and left rail and RW for 1 step in.  Patient continues with weak quads, maybe some lateal quad fasciculations with attempts to lift leg.  Continue mobility and safety on steps.  Patient instructed to wear KI  at all times when standing and ambulating .     Recommendations for follow up therapy are one component of a multi-disciplinary discharge planning process, led by the attending physician.  Recommendations may be updated based on patient status, additional functional criteria and insurance authorization.  Follow Up Recommendations  Home health PT     Assistance Recommended at Discharge Frequent or constant Supervision/Assistance  Patient can return home with the following A little help with walking and/or transfers;Help with stairs or ramp for entrance;A little help with bathing/dressing/bathroom;Assist for transportation   Equipment Recommendations  Rolling walker (2 wheels);Crutches    Recommendations for Other Services       Precautions / Restrictions Precautions Precaution Comments: right knee will buckle , quads are absent Required Braces or Orthoses: Knee Immobilizer - Right Knee Immobilizer - Right: On when out of bed or walking     Mobility  Bed Mobility Overal bed mobility: Needs Assistance Bed Mobility: Supine to  Sit     Supine to sit: Min assist     General bed mobility comments: to left  with KI    Transfers Overall transfer level: Needs assistance Equipment used: Rolling walker (2 wheels) Transfers: Sit to/from Stand Sit to Stand: Min guard           General transfer comment: cues for hand and right leg  with KI    Ambulation/Gait Ambulation/Gait assistance: Min assist Gait Distance (Feet): 50 Feet (x 2) Assistive device: Rolling walker (2 wheels) Gait Pattern/deviations: Step-to pattern Gait velocity: decr     General Gait Details: patient's gait steady with R KI  and Rw   Stairs Stairs: Yes Stairs assistance: Min assist     General stair comments: wife present - practiced 5 with left rail and right crutch, Up 1 step x with Rw going forward.   Wheelchair Mobility    Modified Rankin (Stroke Patients Only)       Balance   Sitting-balance support: No upper extremity supported, Feet supported Sitting balance-Leahy Scale: Good     Standing balance support: Reliant on assistive device for balance, During functional activity, Bilateral upper extremity supported, No upper extremity supported Standing balance-Leahy Scale: Fair Standing balance comment: with KI and  stands and balances static                            Cognition Arousal/Alertness: Awake/alert  Exercises Total Joint Exercises Knee Flexion: AAROM, Right, Sidelying, 10 reps    General Comments        Pertinent Vitals/Pain Pain Assessment Faces Pain Scale: Hurts little more Pain Location: Right thigh, growin with ambulation/movement Pain Descriptors / Indicators: Burning, Grimacing, Tingling    Home Living                          Prior Function            PT Goals (current goals can now be found in the care plan section) Progress towards PT goals: Progressing toward goals    Frequency    Min  3X/week      PT Plan Current plan remains appropriate    Co-evaluation              AM-PAC PT "6 Clicks" Mobility   Outcome Measure  Help needed turning from your back to your side while in a flat bed without using bedrails?: A Little Help needed moving from lying on your back to sitting on the side of a flat bed without using bedrails?: A Little Help needed moving to and from a bed to a chair (including a wheelchair)?: A Little Help needed standing up from a chair using your arms (e.g., wheelchair or bedside chair)?: A Little Help needed to walk in hospital room?: A Little Help needed climbing 3-5 steps with a railing? : A Lot 6 Click Score: 17    End of Session Equipment Utilized During Treatment: Gait belt;Right knee immobilizer Activity Tolerance: Patient tolerated treatment well Patient left: in chair;with family/visitor present;with call bell/phone within reach Nurse Communication: Mobility status PT Visit Diagnosis: Unsteadiness on feet (R26.81);Muscle weakness (generalized) (M62.81);Difficulty in walking, not elsewhere classified (R26.2);Pain;Other symptoms and signs involving the nervous system (R29.898) Pain - Right/Left: Right Pain - part of body: Leg     Time: 3382-5053 PT Time Calculation (min) (ACUTE ONLY): 68 min  Charges:  $Gait Training: 23-37 mins $Therapeutic Exercise: 8-22 mins $Self Care/Home Management: [place] Pager [phone] Office [phone]    Claretha Cooper 09/01/2021, 4:35 PM

## 2021-09-01 NOTE — Progress Notes (Signed)
Nutrition Follow-up  INTERVENTION:   -Ensure Plus High Protein po BID, each supplement provides 350 kcal and 20 grams of protein.   -D/c Boost Breeze  -Multivitamin with minerals daily   NUTRITION DIAGNOSIS:   Increased nutrient needs related to acute illness, cancer and cancer related treatments as evidenced by estimated needs.  Ongoing.  GOAL:   Patient will meet greater than or equal to 90% of their needs  Progressing.  MONITOR:   PO intake, Supplement acceptance, Labs, Weight trends   ASSESSMENT:   81 y.o. male with medical history of CAD, MI and stenting of L main, internal hemorrhoids, prostatitis, HLD, HTN, squamous cell carcinoma of the skin, and basal cell carcinoma. Patient was recently dx with gastric adenocarcinoma with mets and had first cycle of palliative chemo the week PTA. He presented to the ED due to fever, weakness, shortness of breath.  Patient currently consuming 85-100% of meals. Accepting Ensure but not Boost Breeze, will d/c Boost.  Admission weight: 184 lbs. No new weights this admission.  Medications: Colace, Remeron, Multivitamin with minerals daily, Miralax, Senokot, Ferric gluconate  Labs reviewed: Low Na Low iron (27) Folate WNL  Diet Order:   Diet Order             Diet regular Room service appropriate? Yes; Fluid consistency: Thin  Diet effective now                   EDUCATION NEEDS:   No education needs have been identified at this time  Skin:  Skin Assessment: Reviewed RN Assessment  Last BM:  1/25 -type 4  Height:   Ht Readings from Last 1 Encounters:  08/25/21 6\' 1"  (1.854 m)    Weight:   Wt Readings from Last 1 Encounters:  08/25/21 83.6 kg    BMI:  Body mass index is 24.32 kg/m.  Estimated Nutritional Needs:   Kcal:  2100-2300 kcal  Protein:  105-120 grams  Fluid:  >/= 2.2 L/day  Clayton Bibles, MS, RD, LDN Inpatient Clinical Dietitian Contact information available via Amion

## 2021-09-01 NOTE — Plan of Care (Signed)
Pt Billy [name], pleasant and cooperative with staff. Up assist of 1 with WW and Right leg immobilizer. PT/OT following. TOC consulted for D/C planning.  IV Iron infusion today.   Problem: Education: Goal: Knowledge of General Education information will improve Description: Including pain rating scale, medication(s)/side effects and non-pharmacologic comfort measures Outcome: Progressing   Problem: Health Behavior/Discharge Planning: Goal: Ability to manage health-related needs will improve Outcome: Progressing   Problem: Clinical Measurements: Goal: Ability to maintain clinical measurements within normal limits will improve Outcome: Progressing Goal: Will remain free from infection Outcome: Progressing Goal: Diagnostic test results will improve Outcome: Progressing Goal: Respiratory complications will improve Outcome: Progressing Goal: Cardiovascular complication will be avoided Outcome: Progressing   Problem: Activity: Goal: Risk for activity intolerance will decrease Outcome: Progressing   Problem: Nutrition: Goal: Adequate nutrition will be maintained Outcome: Progressing   Problem: Coping: Goal: Level of anxiety will decrease Outcome: Progressing   Problem: Elimination: Goal: Will not experience complications related to bowel motility Outcome: Progressing Goal: Will not experience complications related to urinary retention Outcome: Progressing   Problem: Pain Managment: Goal: General experience of comfort will improve Outcome: Progressing   Problem: Safety: Goal: Ability to remain free from injury will improve Outcome: Progressing   Problem: Skin Integrity: Goal: Risk for impaired skin integrity will decrease Outcome: Progressing

## 2021-09-01 NOTE — Evaluation (Signed)
Occupational Therapy Evaluation Patient Details Name: Billy [name] MRN: [id number] DOB: [birth year] Today's Date: 09/01/2021   History of Present Illness Billy [name] is an 81 y.o. M with gastric CA metastatic to lung, liver, and retroperitoneal/hilar lymph nodes on palliative chemo first dx'd Nov 2022, CAD s/p PCI 2017 who presented with SOB, found to have PE. 1/20 syncopal event, hypoxia increased, developed hypotension CCM consulted, started Levophed --> underwent tPA  1/22; developed thigh hematoma post-tPA, heparin stopped  1/23 IVC filter placed. 1/24 MRI brain without stroke   Clinical Impression   Billy [name] is an 81 year old man who presents with decreased ROM and strength of right lower extremity resulting in a sudden decline in ability to independently ambulate and perform ADLs. He required min assist to transfer to side of bed, need of knee immobilizer to stabilize leg and keep from buckling and min guard for standing and ambulation for safety. He requires min assistance for LB dressing and bathing. Treatment focused on compensatory strategies and home setup for potential discharge. He will need a BSC. He may need tub bench or shower chair depending on the shower her uses. Discussed with patient that insurance does not typically pay. He reports he may have a shower chair. Will recommend [place] OT to assess home environment and assist patient with learning further compensatory strategies at home. No further acute care OT needs.      Recommendations for follow up therapy are one component of a multi-disciplinary discharge planning process, led by the attending physician.  Recommendations may be updated based on patient status, additional functional criteria and insurance authorization.   Follow Up Recommendations  Home health OT    Assistance Recommended at Discharge Intermittent Supervision/Assistance  Patient can return home with the following A little help with  bathing/dressing/bathroom;A little help with walking and/or transfers;Assistance with cooking/housework    Functional Status Assessment  Patient has had a recent decline in their functional status and demonstrates the ability to make significant improvements in function in a reasonable and predictable amount of time.  Equipment Recommendations  BSC/3in1 (RW)    Recommendations for Other Services       Precautions / Restrictions Precautions Precautions: Fall Precaution Comments: right knee will buckle , quads are absent Required Braces or Orthoses: Knee Immobilizer - Right Knee Immobilizer - Right: On when out of bed or walking Restrictions Weight Bearing Restrictions: Yes RLE Weight Bearing: Weight bearing as tolerated      Mobility Bed Mobility Overal bed mobility: Needs Assistance Bed Mobility: Supine to Sit     Supine to sit: Min assist     General bed mobility comments: Min assist for RLE to transfer to edge of bed    Transfers   Equipment used: Rolling walker (2 wheels)               General transfer comment: Overall min guard for ambulation with RW.      Balance Overall balance assessment: Mild deficits observed, not formally tested                                         ADL either performed or assessed with clinical judgement   ADL Overall ADL's : Needs assistance/impaired Eating/Feeding: Independent   Grooming: Min guard;Standing   Upper Body Bathing: Independent   Lower Body Bathing: Minimal assistance;Sit to/from stand   Upper Body Dressing :  Independent   Lower Body Dressing: Minimal assistance;Sit to/from stand Lower Body Dressing Details (indicate cue type and reason): needs assist for right Lowe leg/foot Toilet Transfer: Supervision/safety;Rolling walker (2 wheels)   Toileting- Clothing Manipulation and Hygiene: Supervision/safety;Sitting/lateral lean       Functional mobility during ADLs: Min guard;Rolling  walker (2 wheels) General ADL Comments: Patient demonstrated ability to compensate for LB dressing by demonstrating ability to don donn underwear in bed and bridging. He did report pain with bridging in right thigh. Otherwise we will need assistance for right lower leg and foot when out of bed due to knee immobilizer. Education in regards to safety and compensatory strategies. He will need BSC for bathroom and therapist recommended a shower chair/tub bench depending on shower he uses.     Vision Patient Visual Report: No change from baseline       Perception     Praxis      Pertinent Vitals/Pain Pain Assessment Pain Assessment: Faces Faces Pain Scale: Hurts even more Pain Location: Right thigh, growin with ambulation/movement Pain Descriptors / Indicators: Burning, Grimacing, Sharp Pain Intervention(s): Limited activity within patient's tolerance     Hand Dominance Right   Extremity/Trunk Assessment Upper Extremity Assessment Upper Extremity Assessment: Overall WFL for tasks assessed   Lower Extremity Assessment Lower Extremity Assessment: Defer to PT evaluation   Cervical / Trunk Assessment Cervical / Trunk Assessment: Normal   Communication Communication Communication: No difficulties   Cognition Arousal/Alertness: Awake/alert Behavior During Therapy: WFL for tasks assessed/performed Overall Cognitive Status: Within Functional Limits for tasks assessed                                       General Comments       Exercises     Shoulder Instructions      Home Living Family/patient expects to be discharged to:: Private residence Living Arrangements: Spouse/significant other Available Help at Discharge: Family;Available 24 hours/day Type of Home: House Home Access: Stairs to enter [employer] of Steps: 3   Home Layout: Multi-level;Bed/bath upstairs Alternate Level Stairs-Number of Steps: 7 Alternate Level Stairs-Rails:  Right;Left Bathroom Shower/Tub: Occupational psychologist: Standard     Home Equipment: Crutches;BSC/3in1   Additional Comments: needs new RW      Prior Functioning/Environment Prior Level of Function : Independent/Modified Independent                        OT Problem List: Decreased strength;Decreased range of motion;Decreased activity tolerance;Impaired balance (sitting and/or standing);Decreased knowledge of use of DME or AE;Pain      OT Treatment/Interventions:      OT Goals(Current goals can be found in the care plan section) Acute Rehab OT Goals OT Goal Formulation: All assessment and education complete, DC therapy  OT Frequency:      Co-evaluation              AM-PAC OT "6 Clicks" Daily Activity     Outcome Measure Help from another person eating meals?: None Help from another person taking care of personal grooming?: None Help from another person toileting, which includes using toliet, bedpan, or urinal?: A Little Help from another person bathing (including washing, rinsing, drying)?: A Little Help from another person to put on and taking off regular upper body clothing?: None Help from another person to put on and taking off regular lower body  clothing?: A Little 6 Click Score: 21   End of Session Equipment Utilized During Treatment: Rolling walker (2 wheels);Gait belt Nurse Communication: Mobility status  Activity Tolerance: Patient tolerated treatment well Patient left: in chair;with call bell/phone within reach  OT Visit Diagnosis: Pain;Other abnormalities of gait and mobility (R26.89) Pain - Right/Left: Right Pain - part of body: Hip                Time: 6256-3893 OT Time Calculation (min): 34 min Charges:  OT General Charges $OT Visit: 1 Visit OT Evaluation $OT Eval Low Complexity: 1 Low OT Treatments $Self Care/Home Management : 8-22 mins  Alyla Pietila, OTR/L Acute Care Rehab Services  Office [phone] Pager: [place] 09/01/2021, 11:20 AM

## 2021-09-01 NOTE — Care Management Important Message (Signed)
Important Message  Patient Details IM Letter given to the Patient. Name: SUFIAN RAVI MRN: [id number] Date of Birth: 12/30/1940   Medicare Important Message Given:  Yes     Kerin Salen 09/01/2021, 1:09 PM

## 2021-09-01 NOTE — TOC Progression Note (Signed)
Transition of Care [facility]) - Progression Note    Patient Details  Name: Billy [name] MRN: [id number] Date of Birth: 22-May-1941  Transition of Care [facility]) CM/SW [place], [place] Phone Number: 09/01/2021, 11:54 AM  Clinical Narrative:   Patient seen in follow up to OT recommendation of [place] OT, DME walker and 3 in 1.  Met with Mr Priest and his wife, and they confirmed that they are accepting of the recommendations of the above.  Contacted Genevieve with [facility] who will arrange for delivery of DME to patient's room.  Contacted Cheryl with [facility] who will arranged for [facility] PT, OT.  TOC will continue to follow during the course of hospitalization.     Expected Discharge Plan: [place] Barriers to Discharge: Barriers Resolved  Expected Discharge Plan and Services Expected Discharge Plan: [place] In-house Referral: Clinical Social Work     Living arrangements for the past 2 months: Single Family Home                 DME Arranged: N/A DME Agency: NA                   Social Determinants of Health (SDOH) Interventions    Readmission Risk Interventions Readmission Risk Prevention Plan 08/29/2021  Transportation Screening Complete  HRI or Home Care Consult Complete  Social Work Consult for [place] Planning/Counseling Complete  Palliative Care Screening Complete  Medication Review Press photographer) Complete  Some recent data might be hidden

## 2021-09-01 NOTE — Progress Notes (Signed)
Orthopedic Tech Progress Note Patient Details:  Billy [name] [birth year] [id number]  Patient ID: Billy [name], male   DOB: 1941/03/03, 81 y.o.   MRN: [id number]  [name] 09/01/2021, 3:15 PM Crutches delivered to patients room per instructions from PT

## 2021-09-01 NOTE — Plan of Care (Signed)

## 2021-09-01 NOTE — Progress Notes (Signed)
Progress Note   Patient: Billy [name]:[id number] DOB: 1941-07-04 DOA: 08/25/2021     6 DOS: the patient was seen and examined on 09/01/2021       Brief hospital course: Mr. Gebhard is an 81 y.o. M with gastric CA metastatic to lung, liver, and retroperitoneal/hilar lymph nodes on palliative chemo first dx'd Nov 2022, CAD s/p PCI 2017 who presented with SOB, found to have PE.   1/19 Admitted on heparin 1/20 syncopal event, hypoxia increased, developed hypotension CCM consulted, started Levophed --> underwent tPA 1/22: developed thigh hematoma post-tPA, heparin stopped 1/23 IVC filter placed, transferred to [facility] 1/24 MRI brain without stroke      Assessment and Plan * Shock circulatory ([place])- (present on admission) Cardiogenic shock developed shortly after admission due to massive PE.  Underwent lysis with tPA, was on Levophed, now weaned off and has remained hemodynamically stable.    Acute deep vein thrombosis (DVT) of popliteal vein of right lower extremity (HCC)  Acute pulmonary embolus ([place])- (present on admission) Completed about 72 hrs heparin prior to being d/c'd.  IVC filter placed -Hold heparin   - At d/c, he will follow up with Dr. Burr Medico in 1-2 weeks, she will resume DOAC if safe   Acute blood loss anemia Baseline ~10 at admission, post tPA and heparin, has steadily drifted down 8.6 g/dL today Iron sats very low, retic index very low  - Hold heparin - IV iron    Hematoma of iliopsoas muscle causing Right leg weakness MRI brain ruled out stroke.  Distribution of femoral nerve, of L4 nerve root, based on imaging showing iliacus hematoma, probably it's compression of that L4 branch as it passes through the pelvis.  Discussed with Ortho, Dr. Lynann Bologna, surgical intervention by Trauma would be unlikely to improve this faster and would have high complication rate.  I suspect this will resolve slowly over weeks - PT     Gastric cancer ([place])- (present on  admission) With metastases to liver, lungs, lymph nodes.  - Consult Oncology, will need follow up with Dr. Burr Medico within 2 weeks - Consult palliative care  -Continue MS contin -Continue mirtazapine for appetite  -Hold capecitabine  Rash Present prior to admission,  likely due to pembrolizumab and trastuzumab, had been on dexamethasone taper - COntinue dexamethasone taper  Coronary artery disease involving native coronary artery of native heart without angina pectoris- (present on admission) -Hold aspirin, lisinopril -Continue atorvastatin  Mixed hyperlipidemia- (present on admission) -Continue statin           Subjective: No new fever, chest pain, cough, dyspnea, change in leg weakness or numbness.  Objective Vital signs were reviewed and unremarkable. Elderly adult male, lying in bed, no acute distress, interactive.   Heart rate regular, no murmurs, no lower extremity edema, normal respiratory rate and rhythm Lungs clear without rales or wheezes He has 2/5 weakness of hip flexion on the right, normal hip extension.  He has 2/5 weakness of right knee extension, knee flexion seems normal, ankle strength normal.  Left leg strength normal in all joints.  Paresthesias around the right knee, medial and lateral, less so on the foot.   Attention normal, affect normal, judgment and insight appear normal.    Data Reviewed: Iron studies show iron deficiency, sodium 134, potassium and creatinine normal.  LDL 57.  B12 normal, hemoglobin slightly up, reticulocyte SX low  Family Communication: Wife at the bedside  Disposition: Status is: Inpatient  Remains inpatient appropriate because:  We will  give IV iron today, if hemoglobin stable or trending up tomorrow, likely home with home health           Author: Edwin Dada, MD 09/01/2021 5:44 PM  For on call review [email or web address].

## 2021-09-02 DIAGNOSIS — R579 Shock, unspecified: Secondary | ICD-10-CM | POA: Diagnosis not present

## 2021-09-02 LAB — CBC
HCT: 23.6 % — ABNORMAL LOW (ref 39.0–52.0)
Hemoglobin: 7.6 g/dL — ABNORMAL LOW (ref 13.0–17.0)
MCH: 29.9 pg (ref 26.0–34.0)
MCHC: 32.2 g/dL (ref 30.0–36.0)
MCV: 92.9 fL (ref 80.0–100.0)
Platelets: 210 10*3/uL (ref 150–400)
RBC: 2.54 MIL/uL — ABNORMAL LOW (ref 4.22–5.81)
RDW: 14.2 % (ref 11.5–15.5)
WBC: 14.8 10*3/uL — ABNORMAL HIGH (ref 4.0–10.5)
nRBC: 0 % (ref 0.0–0.2)

## 2021-09-02 LAB — ABO/RH: ABO/RH(D): AB POS

## 2021-09-02 LAB — PREPARE RBC (CROSSMATCH)

## 2021-09-02 MED ORDER — SODIUM CHLORIDE 0.9% IV SOLUTION: Freq: Once | INTRAVENOUS | Status: DC

## 2021-09-02 MED ORDER — SENNOSIDES-DOCUSATE SODIUM 8.6-50 MG PO TABS
1.0000 | ORAL_TABLET | Freq: Two times a day (BID) | ORAL | Status: DC
  Administered 2021-09-02 – 2021-09-03 (×2): 1 via ORAL
  Filled 2021-09-02 (×2): qty 1

## 2021-09-02 MED ORDER — ALPRAZOLAM 0.25 MG PO TABS
0.2500 mg | ORAL_TABLET | Freq: Once | ORAL | Status: AC
  Administered 2021-09-02: 0.25 mg via ORAL
  Filled 2021-09-02: qty 1

## 2021-09-02 NOTE — Progress Notes (Signed)
Oncology Discharge Planning Note  [facility] Address: [address], [place], [place] [address] Hours of Operation:  N[place] Polio, Monday - Friday  Clinic Contact Information:  [phone]) [phone]  Oncology Care Team: Medical Oncologist:  Dr. Truitt Merle  Patient Details: Name:  Reginold, Beale MRN:   [id number] DOB:   04-23-1941 Reason for Current Admission: Shock circulatory [facility])  Discharge Planning Narrative: Discharge follow-up appointments for oncology are current and available on the AVS and MyChart.   Upon discharge from the hospital, hematology/oncology's post discharge plan of care for the outpatient setting is scheduled for 09/14/21 with [place] will be called within two business days after discharge to review hematology/oncology's plan of care for full understanding.    Outpatient Oncology Specific Care Only: Oncology appointment transportation needs addressed?:  not applicable Oncology medication management for symptom management addressed?:  not applicable Chemo Alert Card reviewed?:  not applicable Immunotherapy Alert Card reviewed?:  not applicable

## 2021-09-02 NOTE — Progress Notes (Signed)
Physical Therapy Treatment Patient Details Name: Billy [name] MRN: [id number] DOB: 1940/08/23 Today's Date: 09/02/2021   History of Present Illness Billy [name] is an 81 y.o. M with gastric CA metastatic to lung, liver, and retroperitoneal/hilar lymph nodes on palliative chemo first dx'd Nov 2022, CAD s/p PCI 2017 who presented with SOB, found to have PE. 1/20 syncopal event, hypoxia increased, developed hypotension CCM consulted, started Levophed --> underwent tPA  1/22; developed thigh hematoma post-tPA, heparin stopped  1/23 IVC filter placed. 1/24 MRI brain without stroke    PT Comments    General Comments: AxO x 3 very pleasant but expressed frustration about his CA.  Spouse in room with many questions.  Educated on KI use for "walking only", proper application and reasoning why.  Educated on Pt's Hematoma and location as well as nerve impingement related to Pt's Quad weakness (knee buckle) during walking.  Assisted with amb pt in hallway with walker 25% VC's on proper walker to self distance and safety with turns.  Practiced stairs again using one rail and one crutch.  Pt was able to recall approx 75% from yesterday's PT session practicing stairs.  Educated pt on "up with the good, down with the bad" sequencing.  Assisted pt to bathroom to attempt BM.  Secure chat TOC pt needs a RW and HH PT.     Recommendations for follow up therapy are one component of a multi-disciplinary discharge planning process, led by the attending physician.  Recommendations may be updated based on patient status, additional functional criteria and insurance authorization.  Follow Up Recommendations  Home health PT     Assistance Recommended at Discharge Frequent or constant Supervision/Assistance  Patient can return home with the following A little help with walking and/or transfers;Help with stairs or ramp for entrance;A little help with bathing/dressing/bathroom;Assist for transportation   Equipment  Recommendations  Rolling walker (2 wheels);Crutches    Recommendations for Other Services       Precautions / Restrictions Precautions Precaution Comments: right knee will buckle , quads are absent due to Pelvic Hematoma at Iliopsoas muscle Required Braces or Orthoses: Knee Immobilizer - Right Knee Immobilizer - Right: On when out of bed or walking Restrictions Weight Bearing Restrictions: No RLE Weight Bearing: Weight bearing as tolerated     Mobility  Bed Mobility               General bed mobility comments: OOB in recliner    Transfers Overall transfer level: Needs assistance Equipment used: Rolling walker (2 wheels) Transfers: Sit to/from Stand Sit to Stand: Supervision, Min guard           General transfer comment: good use of hands to steady self    Ambulation/Gait Ambulation/Gait assistance: Supervision, Min guard Gait Distance (Feet): 76 Feet Assistive device: Rolling walker (2 wheels) Gait Pattern/deviations: Step-to pattern Gait velocity: decreased     General Gait Details: with KI R LE to prevent knee buckle tolerated amb a functional distance.   Stairs Stairs: Yes Stairs assistance: Supervision, Min guard Stair Management: One rail Right, With crutches Number of Stairs: 6 General stair comments: practiced stairs again today and pt was able to recall approx 75% tech using one rail and one crutch.   Wheelchair Mobility    Modified Rankin (Stroke Patients Only)       Balance  Cognition Arousal/Alertness: Awake/alert Behavior During Therapy: WFL for tasks assessed/performed Overall Cognitive Status: Within Functional Limits for tasks assessed                                 General Comments: AxO x 3 very pleasant but expressed frustration about his CA        Exercises      General Comments        Pertinent Vitals/Pain Pain Assessment Pain  Assessment: Faces Faces Pain Scale: Hurts a little bit Pain Location: Right thigh, growin with ambulation/movement Pain Descriptors / Indicators: Burning, Grimacing, Tingling Pain Intervention(s): Limited activity within patient's tolerance, Monitored during session    Home Living                          Prior Function            PT Goals (current goals can now be found in the care plan section) Progress towards PT goals: Progressing toward goals    Frequency    Min 3X/week      PT Plan Current plan remains appropriate    Co-evaluation              AM-PAC PT "6 Clicks" Mobility   Outcome Measure  Help needed turning from your back to your side while in a flat bed without using bedrails?: A Little Help needed moving from lying on your back to sitting on the side of a flat bed without using bedrails?: A Little Help needed moving to and from a bed to a chair (including a wheelchair)?: A Little Help needed standing up from a chair using your arms (e.g., wheelchair or bedside chair)?: A Little Help needed to walk in hospital room?: A Little Help needed climbing 3-5 steps with a railing? : A Little 6 Click Score: 18    End of Session Equipment Utilized During Treatment: Gait belt;Right knee immobilizer Activity Tolerance: Patient tolerated treatment well Patient left: Other (comment) (bathroom, notified RN) Nurse Communication: Mobility status PT Visit Diagnosis: Unsteadiness on feet (R26.81);Muscle weakness (generalized) (M62.81);Difficulty in walking, not elsewhere classified (R26.2);Pain;Other symptoms and signs involving the nervous system (R29.898) Pain - Right/Left: Right Pain - part of body: Leg     Time: 1202-1226 PT Time Calculation (min) (ACUTE ONLY): 24 min  Charges:  $Gait Training: 8-22 mins $Therapeutic Activity: 8-22 mins                     Rica Koyanagi  PTA Acute  Rehabilitation Services Pager      [phone] Office       [phone]

## 2021-09-02 NOTE — Progress Notes (Signed)
PROGRESS NOTE    Marqus Macphee Nucci  MGQ:[id number] DOB: April 07, 1941 DOA: 08/25/2021 PCP: Biagio Borg, MD    Chief Complaint  Patient presents with   Weakness    Brief Narrative:  Mr. Dalton is an 81 y.o. M with gastric CA metastatic to lung, liver, and retroperitoneal/hilar lymph nodes on palliative chemo first dx'd Nov 2022, CAD s/p PCI 2017 who presented with SOB, found to have PE.     1/19 Admitted on heparin 1/20 syncopal event, hypoxia increased, developed hypotension CCM consulted, started Levophed --> underwent tPA 1/22: developed thigh hematoma post-tPA, heparin stopped 1/23 IVC filter placed, transferred to [facility] 1/24 MRI brain without stroke  Subjective:  Reports feeling tired, has no energy  Right thigh pain is better, able to more left foot more, not able to lift right leg against gravity   Wife at bedside  Assessment & Plan:   Principal Problem:   Shock circulatory ([place]) Active Problems:   Mixed hyperlipidemia   Coronary artery disease involving native coronary artery of native heart without angina pectoris   Gastric cancer ([place])   Acute pulmonary embolus ([place])   Hematoma of iliopsoas muscle, initial encounter   Acute blood loss anemia   Right leg weakness   Acute deep vein thrombosis (DVT) of popliteal vein of right lower extremity (HCC)   Rash  Massive PE, s/p tpa and hepatin with complication of right pelvic hematoma and hypotension/circulatory shock/ Acute DVT right lower extremity -Improved, currently hemodynamically stable  -Off anticoagulation, s/p IVC filter placement on 1/23  Fever , initially required abx, culture negative, abx d/ced , as fever thought due to PE vs chemo drug reaction Fever resolved    Hematoma of iliopsoas muscle causing Right thigh pain, right leg weakness MRI brain ruled out stroke.   Distribution of femoral nerve, of L4 nerve root, based on imaging showing iliacus hematoma, probably it's compression of that L4 branch as  it passes through the pelvis. Discussed with Ortho, Dr. Lynann Bologna, surgical intervention by Trauma would be unlikely to improve this faster and would have high complication rate. Conservation management PT eval    Iron deficiency anemia/symptomatic anemia S/p iv iron on 1/26 Prbcx1 units on 1/26 Repeat cbc in am  Metastatic gastric cancer, management per oncology Seen by palliative care here already, interested on outpatient palliative care referral, order placed  Rash; likely from pembrolizumab/trastuzumab Improving On steroid  CAD Denies chest pain Hold asa Continue statin  FTT, home health    The patients BMI is: Body mass index is 24.32 kg/m.Marland Kitchen  Seen by dietician.  I agree with the assessment and plan as outlined below:  Nutrition Status: Nutrition Problem: Increased nutrient needs Etiology: acute illness, cancer and cancer related treatments Signs/Symptoms: estimated needs Interventions: Ensure Enlive (each supplement provides 350kcal and 20 grams of protein), Boost Breeze, MVI  .     Skin Assessment:  I have examined the patients skin and I agree with the wound assessment as performed by the wound care RN as outlined below:     Unresulted Labs (From admission, onward)     Start     Ordered   09/03/21 0500  CBC  Tomorrow morning,   R       Question:  Specimen collection method  Answer:  IV Team=IV Team collect   09/02/21 1510   09/02/21 1509  Prepare RBC (crossmatch)  (Adult Blood Administration - Red Blood Cells)  Once,   R       Question  Answer Comment  # of Units 1 unit   Transfusion Indications Symptomatic Anemia   Number of Units to Keep Ahead NO units ahead   If emergent release call blood bank [facility] [phone]      09/02/21 1508   09/02/21 1508  Type and screen [place]  Once,   R       Comments: [place]    09/02/21 1508              DVT prophylaxis: off anticoagulation due to  hematoma   Code Status:DNR Family Communication: wife at bedside  Disposition:   Status is: Inpatient  Dispo: The patient is from: home              Anticipated d/c is to: home with [facility]              Anticipated d/c date is: 1/28                Consultants:  Oncology Palliative care Critical care   Antimicrobials:   Anti-infectives (From admission, onward)    Start     Dose/Rate Route Frequency Ordered Stop   08/28/21 1000  ceFEPIme (MAXIPIME) 2 g in sodium chloride 0.9 % 100 mL IVPB  Status:  Discontinued        2 g 200 mL/hr over 30 Minutes Intravenous Every 8 hours 08/28/21 0853 08/29/21 0935   08/26/21 2000  vancomycin (VANCOREADY) IVPB 1500 mg/300 mL  Status:  Discontinued        1,500 mg 150 mL/hr over 120 Minutes Intravenous Every 24 hours 08/26/21 0038 08/28/21 0812   08/26/21 1600  ceFEPIme (MAXIPIME) 2 g in sodium chloride 0.9 % 100 mL IVPB  Status:  Discontinued        2 g 200 mL/hr over 30 Minutes Intravenous Every 12 hours 08/26/21 0524 08/28/21 0853   08/26/21 0200  ceFEPIme (MAXIPIME) 2 g in sodium chloride 0.9 % 100 mL IVPB  Status:  Discontinued        2 g 200 mL/hr over 30 Minutes Intravenous Every 8 hours 08/26/21 0038 08/26/21 0524   08/25/21 1830  vancomycin (VANCOREADY) IVPB 1500 mg/300 mL        1,500 mg 150 mL/hr over 120 Minutes Intravenous  Once 08/25/21 1818 08/26/21 0041   08/25/21 1815  ceFEPIme (MAXIPIME) 2 g in sodium chloride 0.9 % 100 mL IVPB        2 g 200 mL/hr over 30 Minutes Intravenous  Once 08/25/21 1808 08/25/21 2041   08/25/21 1815  metroNIDAZOLE (FLAGYL) IVPB 500 mg        500 mg 100 mL/hr over 60 Minutes Intravenous  Once 08/25/21 1808 08/25/21 2122   08/25/21 1815  vancomycin (VANCOCIN) IVPB 1000 mg/200 mL premix  Status:  Discontinued        1,000 mg 200 mL/hr over 60 Minutes Intravenous  Once 08/25/21 1808 08/25/21 1818           Objective: Vitals:   09/01/21 1415 09/01/21 2004 09/02/21 0546 09/02/21 1323  BP:  120/73 114/80 (!) 145/76 103/68  Pulse: 67 64 64 62  Resp:  18 18   Temp: 98.6 F (37 C) 97.7 F (36.5 C) 97.8 F (36.6 C) 98.6 F (37 C)  TempSrc: Oral Oral Oral   SpO2: 96% 93% 96% 97%  Weight:      Height:        Intake/Output Summary (Last 24 hours) at 09/02/2021  [place] filed at 09/02/2021 0500 Gross per 24 hour  Intake 120 ml  Output 1500 ml  Net -1380 ml   Filed Weights   08/25/21 2211  Weight: 83.6 kg    Examination:  General exam: alert, awake, communicative Respiratory system: Clear to auscultation. Respiratory effort normal. Cardiovascular system:  RRR.  Gastrointestinal system: Abdomen is nondistended, soft and nontender.  Normal bowel sounds heard. Central nervous system: Alert and oriented. No focal neurological deficits. Extremities:  right thigh pain, right leg weakness Skin: No rashes, lesions or ulcers Psychiatry: Judgement and insight appear normal. Mood & affect appropriate.     Data Reviewed: I have personally reviewed following labs and imaging studies  CBC: Recent Labs  Lab 08/29/21 0500 08/30/21 0602 08/31/21 0425 08/31/21 2009 09/02/21 0359  WBC 5.6 7.3 12.6* 16.2* 14.8*  HGB 8.2* 7.8* 7.6* 8.6* 7.6*  HCT 26.1* 24.3* 23.7* 26.3* 23.6*  MCV 96.0 94.9 94.4 93.6 92.9  PLT 256 204 221 272 614    Basic Metabolic Panel: Recent Labs  Lab 08/27/21 0905 08/31/21 0425 08/31/21 2009  NA 133* 136 134*  K 4.0 4.1 4.6  CL 106 105 102  CO2 20* 26 25  GLUCOSE 128* 90 126*  BUN 35* 32* 34*  CREATININE 1.02 0.67 0.80  CALCIUM 7.7* 8.0* 8.2*    GFR: Estimated Creatinine Clearance: 83.2 mL/min (by C-G formula based on SCr of 0.8 mg/dL).  Liver Function Tests: No results for input(s): AST, ALT, ALKPHOS, BILITOT, PROT, ALBUMIN in the last 168 hours.  CBG: No results for input(s): GLUCAP in the last 168 hours.   Recent Results (from the past 240 hour(s))  Blood Culture (routine x 2)     Status: None   Collection Time: 08/25/21   5:48 PM   Specimen: Right Antecubital; Blood  Result Value Ref Range Status   Specimen Description   Final    RIGHT ANTECUBITAL Performed at [facility] [address], [place], [place] [address]    Special Requests   Final    BOTTLES DRAWN AEROBIC AND ANAEROBIC Blood Culture results may not be optimal due to an inadequate volume of blood received in culture bottles Performed at [facility] [address], [place], [place] [address]    Culture   Final    NO GROWTH 5 DAYS Performed at [facility], [place] [address], [place], [place] [address]    Report Status 08/30/2021 FINAL  Final  Blood Culture (routine x 2)     Status: None   Collection Time: 08/25/21  5:53 PM   Specimen: Left Antecubital; Blood  Result Value Ref Range Status   Specimen Description   Final    LEFT ANTECUBITAL Performed at [facility] [address], [place], [place] [address]    Special Requests   Final    BOTTLES DRAWN AEROBIC AND ANAEROBIC Blood Culture results may not be optimal due to an inadequate volume of blood received in culture bottles Performed at [facility] [address], [place], [place] [address]    Culture   Final    NO GROWTH 5 DAYS Performed at [facility], [place] [address] [place], [place] [address]    Report Status 08/30/2021 FINAL  Final  Resp Panel by RT-PCR (Flu A&B, Covid) Nasopharyngeal Swab     Status: None   Collection Time: 08/26/21  3:08 AM   Specimen: Nasopharyngeal Swab; Nasopharyngeal(NP) swabs in vial transport  medium  Result Value Ref Range Status   SARS Coronavirus 2 by RT PCR NEGATIVE NEGATIVE Final    Comment: (NOTE) SARS-CoV-2 target nucleic acids are NOT DETECTED.  The SARS-CoV-2 RNA is generally detectable in upper respiratory specimens during the acute phase of infection. The lowest concentration of SARS-CoV-2 viral copies this assay can detect is 138 copies/mL. A  negative result does not preclude SARS-Cov-2 infection and should not be used as the sole basis for treatment or other patient management decisions. A negative result may occur with  improper specimen collection/handling, submission of specimen other than nasopharyngeal swab, presence of viral mutation(s) within the areas targeted by this assay, and inadequate number of viral copies(<138 copies/mL). A negative result must be combined with clinical observations, patient history, and epidemiological information. The expected result is Negative.  Fact Sheet for Patients:  EntrepreneurPulse.com.au  Fact Sheet for Healthcare Providers:  IncredibleEmployment.be  This test is no t yet approved or cleared by the [place] FDA and  has been authorized for detection and/or diagnosis of SARS-CoV-2 by FDA under an Emergency Use Authorization (EUA). This EUA will remain  in effect (meaning this test can be used) for the duration of the COVID-19 declaration under Section 564(b)(1) of the Act, 21 U.S.C.section 360bbb-3(b)(1), unless the authorization is terminated  or revoked sooner.       Influenza A by PCR NEGATIVE NEGATIVE Final   Influenza B by PCR NEGATIVE NEGATIVE Final    Comment: (NOTE) The Xpert Xpress SARS-CoV-2/FLU/RSV plus assay is intended as an aid in the diagnosis of influenza from Nasopharyngeal swab specimens and should not be used as a sole basis for treatment. Nasal washings and aspirates are unacceptable for Xpert Xpress SARS-CoV-2/FLU/RSV testing.  Fact Sheet for Patients: EntrepreneurPulse.com.au  Fact Sheet for Healthcare Providers: IncredibleEmployment.be  This test is not yet approved or cleared by the [place] FDA and has been authorized for detection and/or diagnosis of SARS-CoV-2 by FDA under an Emergency Use Authorization (EUA). This EUA will remain in effect (meaning this test can  be used) for the duration of the COVID-19 declaration under Section 564(b)(1) of the Act, 21 U.S.C. section 360bbb-3(b)(1), unless the authorization is terminated or revoked.  Performed at [facility], [place] [address], [place], [place] [address]   MRSA Next Gen by PCR, Nasal     Status: Abnormal   Collection Time: 08/26/21  8:59 AM   Specimen: Nasal Mucosa; Nasal Swab  Result Value Ref Range Status   MRSA by PCR Next Gen NEGATIVE (A) NOT DETECTED Final    Comment: Performed at [facility], [place] [address] [place],  [address]         Radiology Studies: No results found.      Scheduled Meds:  sodium chloride   Intravenous Once   ALPRAZolam  0.25 mg Oral Once   atorvastatin  80 mg Oral q1800   Chlorhexidine Gluconate Cloth  6 each Topical Q0600   dexamethasone  6 mg Oral Daily   docusate sodium  100 mg Oral BID   feeding supplement  237 mL Oral BID BM   mirtazapine  7.5 mg Oral QHS   morphine  15 mg Oral Q12H   multivitamin with minerals  1 tablet Oral Daily   pantoprazole  40 mg Oral BID AC   polyethylene glycol  17 g Oral Daily   senna-docusate  1 tablet Oral BID   sodium chloride flush  10-40 mL Intracatheter Q12H  Continuous Infusions:  sodium chloride 250 mL (08/26/21 1152)     LOS: 7 days    Greater than 50% of this time was spent in counseling, explanation of diagnosis, planning of further management, and coordination of care.   Voice Recognition Viviann Spare dictation system was used to create this note, attempts have been made to correct errors. Please contact the author with questions and/or clarifications.   Florencia Reasons, MD PhD FACP [facility]  Available via Epic secure chat 7am-7pm for nonurgent issues Please page for urgent issues To page the attending provider between 7A-7P or the covering provider during after hours 7P-7A, please log into the web site [email or web address] and access using universal [facility] password for that web site. If you do not have the password, please call the hospital operator.    09/02/2021, 3:30 PM

## 2021-09-02 NOTE — Progress Notes (Signed)
Transfusion order placed by attending with Type and Screen.  This RN placed order for IV Team to draw required labs from pt port.

## 2021-09-02 NOTE — Progress Notes (Addendum)
Billy [name]   DOB:12/13/1940   ME#:[id number]   WKG#:[id number]  Oncology follow up   Subjective: IVC filter placed 1/23.  Continues to have pain to his right lower extremity.  Also reports some numbness and loss of sensation.  He is afebrile.  And his other vital signs are stable.  He denies active bleeding.   Objective:  Vitals:   09/01/21 2004 09/02/21 0546  BP: 114/80 (!) 145/76  Pulse: 64 64  Resp: 18 18  Temp: 97.7 F (36.5 C) 97.8 F (36.6 C)  SpO2: 93% 96%    Body mass index is 24.32 kg/m.  Intake/Output Summary (Last 24 hours) at 09/02/2021 1026 Last data filed at 09/02/2021 0500 Gross per 24 hour  Intake 1230.48 ml  Output 1500 ml  Net -269.52 ml     Sclerae unicteric  Oropharynx clear  No peripheral adenopathy  Lungs clear -- no rales or rhonchi  Heart regular rate and rhythm  Abdomen benign  MSK: Right knee immobilizer in place  Neuro nonfocal    CBG (last 3)  No results for input(s): GLUCAP in the last 72 hours.   Labs:   Urine Studies No results for input(s): UHGB, CRYS in the last 72 hours.  Invalid input(s): UACOL, UAPR, USPG, UPH, UTP, UGL, UKET, UBIL, UNIT, UROB, ULEU, UEPI, UWBC, [place], [place], [place], [place], [place]  Basic Metabolic Panel: Recent Labs  Lab 08/27/21 0905 08/31/21 0425 08/31/21 2009  NA 133* 136 134*  K 4.0 4.1 4.6  CL 106 105 102  CO2 20* 26 25  GLUCOSE 128* 90 126*  BUN 35* 32* 34*  CREATININE 1.02 0.67 0.80  CALCIUM 7.7* 8.0* 8.2*   GFR Estimated Creatinine Clearance: 83.2 mL/min (by C-G formula based on SCr of 0.8 mg/dL). Liver Function Tests: No results for input(s): AST, ALT, ALKPHOS, BILITOT, PROT, ALBUMIN in the last 168 hours.  No results for input(s): LIPASE, AMYLASE in the last 168 hours. No results for input(s): AMMONIA in the last 168 hours. Coagulation profile Recent Labs  Lab 08/26/21 1135  INR 1.5*    CBC: Recent Labs  Lab 08/29/21 0500 08/30/21 0602 08/31/21 0425 08/31/21 2009 09/02/21 0359   WBC 5.6 7.3 12.6* 16.2* 14.8*  HGB 8.2* 7.8* 7.6* 8.6* 7.6*  HCT 26.1* 24.3* 23.7* 26.3* 23.6*  MCV 96.0 94.9 94.4 93.6 92.9  PLT 256 204 221 272 210   Cardiac Enzymes: No results for input(s): CKTOTAL, CKMB, CKMBINDEX, TROPONINI in the last 168 hours. BNP: Invalid input(s): POCBNP CBG: No results for input(s): GLUCAP in the last 168 hours. D-Dimer No results for input(s): DDIMER in the last 72 hours. Hgb A1c No results for input(s): HGBA1C in the last 72 hours. Lipid Profile Recent Labs    08/31/21 0425  CHOL 100  HDL 29*  LDLCALC 57  TRIG 71  CHOLHDL 3.4   Thyroid function studies No results for input(s): TSH, T4TOTAL, T3FREE, THYROIDAB in the last 72 hours.  Invalid input(s): FREET3 Anemia work up Recent Labs    08/31/21 1427  VITAMINB12 4,277*  FOLATE 31.5  FERRITIN 636*  TIBC 307  IRON 27*  RETICCTPCT 0.6   Microbiology Recent Results (from the past 240 hour(s))  Blood Culture (routine x 2)     Status: None   Collection Time: 08/25/21  5:48 PM   Specimen: Right Antecubital; Blood  Result Value Ref Range Status   Specimen Description   Final    RIGHT ANTECUBITAL Performed at [facility], [place]  [address] [place], [place] [address]    Special Requests   Final    BOTTLES DRAWN AEROBIC AND ANAEROBIC Blood Culture results may not be optimal due to an inadequate volume of blood received in culture bottles Performed at [facility] [address], [place], [place] [address]    Culture   Final    NO GROWTH 5 DAYS Performed at [facility], [place] [address], [place], [place] [address]    Report Status 08/30/2021 FINAL  Final  Blood Culture (routine x 2)     Status: None   Collection Time: 08/25/21  5:53 PM   Specimen: Left Antecubital; Blood  Result Value Ref Range Status   Specimen Description   Final    LEFT ANTECUBITAL Performed at [facility] [address] [place], [place] [address]     Special Requests   Final    BOTTLES DRAWN AEROBIC AND ANAEROBIC Blood Culture results may not be optimal due to an inadequate volume of blood received in culture bottles Performed at [facility] [address] [place], [place] [address]    Culture   Final    NO GROWTH 5 DAYS Performed at [facility], [place] [address] [place], [place] [address]    Report Status 08/30/2021 FINAL  Final  Resp Panel by RT-PCR (Flu A&B, Covid) Nasopharyngeal Swab     Status: None   Collection Time: 08/26/21  3:08 AM   Specimen: Nasopharyngeal Swab; Nasopharyngeal(NP) swabs in vial transport medium  Result Value Ref Range Status   SARS Coronavirus 2 by RT PCR NEGATIVE NEGATIVE Final    Comment: (NOTE) SARS-CoV-2 target nucleic acids are NOT DETECTED.  The SARS-CoV-2 RNA is generally detectable in upper respiratory specimens during the acute phase of infection. The lowest concentration of SARS-CoV-2 viral copies this assay can detect is 138 copies/mL. A negative result does not preclude SARS-Cov-2 infection and should not be used as the sole basis for treatment or other patient management decisions. A negative result may occur with  improper specimen collection/handling, submission of specimen other than nasopharyngeal swab, presence of viral mutation(s) within the areas targeted by this assay, and inadequate number of viral copies(<138 copies/mL). A negative result must be combined with clinical observations, patient history, and epidemiological information. The expected result is Negative.  Fact Sheet for Patients:  EntrepreneurPulse.com.au  Fact Sheet for Healthcare Providers:  IncredibleEmployment.be  This test is no t yet approved or cleared by the [place] FDA and  has been authorized for detection and/or diagnosis of SARS-CoV-2 by FDA under an Emergency Use Authorization (EUA). This EUA will remain  in effect (meaning this  test can be used) for the duration of the COVID-19 declaration under Section 564(b)(1) of the Act, 21 U.S.C.section 360bbb-3(b)(1), unless the authorization is terminated  or revoked sooner.       Influenza A by PCR NEGATIVE NEGATIVE Final   Influenza B by PCR NEGATIVE NEGATIVE Final    Comment: (NOTE) The Xpert Xpress SARS-CoV-2/FLU/RSV plus assay is intended as an aid in the diagnosis of influenza from Nasopharyngeal swab specimens and should not be used as a sole basis for treatment. Nasal washings and aspirates are unacceptable for Xpert Xpress SARS-CoV-2/FLU/RSV testing.  Fact Sheet for Patients: EntrepreneurPulse.com.au  Fact Sheet for Healthcare Providers: IncredibleEmployment.be  This test is not yet approved or cleared by the [place] FDA and has been authorized for detection and/or diagnosis of SARS-CoV-2 by FDA under an  Emergency Use Authorization (EUA). This EUA will remain in effect (meaning this test can be used) for the duration of the COVID-19 declaration under Section 564(b)(1) of the Act, 21 U.S.C. section 360bbb-3(b)(1), unless the authorization is terminated or revoked.  Performed at [facility], [place] [address], [place], [place] [address]   MRSA Next Gen by PCR, Nasal     Status: Abnormal   Collection Time: 08/26/21  8:59 AM   Specimen: Nasal Mucosa; Nasal Swab  Result Value Ref Range Status   MRSA by PCR Next Gen NEGATIVE (A) NOT DETECTED Final    Comment: Performed at [facility], [place] [address] [place],  [address]      Studies:  No results found.  Assessment: 81 y.o. male   1.  Massive PE, s/p tpa and hepatin with complication of right pelvic hematoma and hypotension, improved now 2. Metastatic gastric cancer, on palliative chemo  3.  Fever, resolved  4.  Maculopapular rash thought to be due to pembrolizumab/trastuzumab 5.  Normocytic anemia and blood loss  anemia  6.  Leukocytosis 7.  DNR   Plan:  -due to anticoagulation related hematoma and bleeding, heparin has been stopped an IVC filter was placed.  I discussed that the filter does not help the blood clots he has in his right lung, but will prevent any future DVT in legs traveling to lungs  -I may put him on prophylactic dose anticoagulation in about 2 weeks if he is stable  -We will plan to hold off on chemotherapy for the next 2 to 4 weeks and then reevaluate his candidacy for chemotherapy.  If he is able to recover reasonably well, would consider low-dose chemotherapy with Xeloda along with trastuzumab and Keytruda.  Today, the patient was somewhat tearful when talking about treatment versus observation.  He is not sure if he would like to undergo any additional therapies.  However, he will follow-up in the office and have further discussions regarding his candidacy for additional treatment and how would affect his overall prognosis and quality of life. -Hemoglobin is 7.6 today.  Recommend PRBC transfusion for hemoglobin less than 7.5. -Agree with DNR -I have sent a scheduling message to arrange for outpatient follow-up in approximately 2 weeks.  Billy Bussing, NP 09/02/2021  10:26 AM  Addendum  I have seen the patient, examined him. I agree with the assessment and and plan and have edited the notes.   Patient is overall stable, still feels very weak, right back pain has improved, he was able to walk with a walker for short distance today when he did physical therapy, but he has been spending most time in bed.  He will go home with home care soon.  I agree with blood transfusion, which will improve his fatigue.  Continue home care and physical therapy.  Patient is not sure if he wants to continue chemotherapy.  We briefly discussed home hospice if he decides not to resume chemo late on.  I do think he will recover some, will see him back in 2-3 weeks. If he needs repeated lab, please check if  home care service can do, or come to my office if not.   Billy [name]  09/02/2021

## 2021-09-02 NOTE — Plan of Care (Signed)
Pt aox4, pleasant and cooperative with staff. Up assist of 1 with WW and Right leg immobilizer. PT/OT following. TOC D/C planning for home health, DME.  Problem: Education: Goal: Knowledge of General Education information will improve Description: Including pain rating scale, medication(s)/side effects and non-pharmacologic comfort measures Outcome: Progressing   Problem: Health Behavior/Discharge Planning: Goal: Ability to manage health-related needs will improve Outcome: Progressing   Problem: Clinical Measurements: Goal: Ability to maintain clinical measurements within normal limits will improve Outcome: Progressing Goal: Will remain free from infection Outcome: Progressing Goal: Diagnostic test results will improve Outcome: Progressing Goal: Respiratory complications will improve Outcome: Progressing Goal: Cardiovascular complication will be avoided Outcome: Progressing   Problem: Activity: Goal: Risk for activity intolerance will decrease Outcome: Progressing   Problem: Nutrition: Goal: Adequate nutrition will be maintained Outcome: Progressing   Problem: Coping: Goal: Level of anxiety will decrease Outcome: Progressing   Problem: Elimination: Goal: Will not experience complications related to bowel motility Outcome: Progressing Goal: Will not experience complications related to urinary retention Outcome: Progressing   Problem: Pain Managment: Goal: General experience of comfort will improve Outcome: Progressing   Problem: Safety: Goal: Ability to remain free from injury will improve Outcome: Progressing   Problem: Skin Integrity: Goal: Risk for impaired skin integrity will decrease Outcome: Progressing

## 2021-09-03 DIAGNOSIS — I82431 Acute embolism and thrombosis of right popliteal vein: Secondary | ICD-10-CM | POA: Diagnosis not present

## 2021-09-03 DIAGNOSIS — D62 Acute posthemorrhagic anemia: Secondary | ICD-10-CM | POA: Diagnosis not present

## 2021-09-03 DIAGNOSIS — R579 Shock, unspecified: Secondary | ICD-10-CM | POA: Diagnosis not present

## 2021-09-03 LAB — CBC
HCT: 27.4 % — ABNORMAL LOW (ref 39.0–52.0)
Hemoglobin: 8.8 g/dL — ABNORMAL LOW (ref 13.0–17.0)
MCH: 29.8 pg (ref 26.0–34.0)
MCHC: 32.1 g/dL (ref 30.0–36.0)
MCV: 92.9 fL (ref 80.0–100.0)
Platelets: 198 10*3/uL (ref 150–400)
RBC: 2.95 MIL/uL — ABNORMAL LOW (ref 4.22–5.81)
RDW: 14.7 % (ref 11.5–15.5)
WBC: 14.2 10*3/uL — ABNORMAL HIGH (ref 4.0–10.5)
nRBC: 0 % (ref 0.0–0.2)

## 2021-09-03 MED ORDER — HEPARIN SOD (PORK) LOCK FLUSH 100 UNIT/ML IV SOLN
500.0000 [IU] | INTRAVENOUS | Status: AC | PRN
  Administered 2021-09-03: 500 [IU]
  Filled 2021-09-03: qty 5

## 2021-09-03 NOTE — Plan of Care (Signed)

## 2021-09-03 NOTE — Discharge Summary (Addendum)
Physician Discharge Summary  Billy [name] RAQ:[id number] DOB: 07/19/1941 DOA: 08/25/2021  PCP: Biagio Borg, MD  Admit date: 08/25/2021 Discharge date: 09/03/2021  Admitted From: home Disposition:  home  Recommendations for Outpatient Follow-up:  Follow up with Dr Burr Medico in 2 weeks  Home Health: PT Equipment/Devices: none  Discharge Condition: stable CODE STATUS: DNR Diet recommendation: regular  HPI: Per admitting MD, Billy [name] is a 81 y.o. male with medical history significant of CAD with MI and stenting of Left main. Recently diagnosed with gastric adenocarcinoma with mets. He has started chemo having had first round weeks ago with mild reactions. HE was started on a 48 hour infusion today. After returning home he developed a fever to 102, felt hot, weak and short of breath. He presents to [facility] for evaluation. He denies having any chest pain, cough, rigors.   Hospital Course / Discharge diagnoses: Principal problem Massive PE-patient was admitted to the hospital with massive PE associated with cardiogenic shock, requiring ICU stay, pressors as well as tPA.  He eventually stabilized, was placed on anticoagulation but developed iliopsoas muscle hematoma, currently off anticoagulation and status post IVC filter on 1/23.  He has remained stable, will be discharged home with outpatient follow-up with oncology.  Active problems Hematoma of iliopsoas muscle-causing right thigh pain, right leg weakness due to nerve compression.  Underwent an MRI of the brain which ruled out a stroke.  Case was discussed with Dr. Lynann Bologna with orthopedic surgery, surgical intervention would be unlikely to improve this faster and has a high complication rate.  Improving with conservative management Rash-prior to admission, likely due to pembrolizumab and trastuzumab, status post steroids Metastatic gastric cancer-outpatient follow-up with oncology CAD-denies chest pain, hold aspirin, continue  statin Acute blood loss anemia/iron deficiency-received IV iron as well as blood transfusion while hospitalized.  Hemoglobin stable Fever-probably due to PE, was briefly on antibiotics but they have been discontinued and he is remained stable. Acute DVT-status post IVC filter  Sepsis ruled out   Discharge Instructions  Discharge Instructions     Amb Referral to Palliative Care   Complete by: As directed       Allergies as of 09/03/2021       Reactions   Ofloxacin    REACTION: rash        Medication List     STOP taking these medications    aspirin EC 81 MG tablet   HYDROcodone-acetaminophen 5-325 MG tablet Commonly known as: Norco   traMADol 50 MG tablet Commonly known as: ULTRAM       TAKE these medications    ALPRAZolam 0.5 MG tablet Commonly known as: XANAX Take 1 tablet (0.5 mg total) by mouth 2 (two) times daily as needed for anxiety.   atorvastatin 80 MG tablet Commonly known as: LIPITOR Take 1 tablet (80 mg total) by mouth daily at 6 PM.   capecitabine 500 MG tablet Commonly known as: XELODA Take 3 tablets every 12 hours, for 14 days then off for 7 days. Take after meal.   Fish Oil 500 MG Caps Take 500 mg by mouth daily.   lidocaine-prilocaine cream Commonly known as: EMLA Apply 1 application topically as needed. Apply 0.33gm to PortaCath site 30 to 60 mins prior to PortaCath access.   lisinopril 2.5 MG tablet Commonly known as: ZESTRIL Take 1 tablet (2.5 mg total) by mouth daily.   mirtazapine 7.5 MG tablet Commonly known as: REMERON Take 1 tablet (7.5 mg total) by mouth at  bedtime.   morphine 15 MG 12 hr tablet Commonly known as: MS CONTIN Take 1 tablet (15 mg total) by mouth every 12 (twelve) hours.   nitroGLYCERIN 0.4 MG SL tablet Commonly known as: NITROSTAT Place 1 tablet (0.4 mg total) under the tongue every 5 (five) minutes as needed for chest pain. Max 3 doses.   ondansetron 8 MG tablet Commonly known as: ZOFRAN Take 1  tablet (8 mg total) by mouth every 8 (eight) hours as needed for nausea or vomiting.   oxyCODONE 5 MG immediate release tablet Commonly known as: Oxy IR/ROXICODONE Take 1 tablet (5 mg total) by mouth every 4 (four) hours as needed for severe pain.   prochlorperazine 10 MG tablet Commonly known as: COMPAZINE Take 1 tablet (10 mg total) by mouth every 6 (six) hours as needed for nausea or vomiting.   VITAMIN D3 PO Take 1 tablet by mouth daily.               Durable Medical Equipment  (From admission, onward)           Start     Ordered   09/02/21 1454  For home use only DME Walker  Once       Question:  Patient needs a walker to treat with the following condition  Answer:  FTT (failure to thrive) in adult   09/02/21 1453   09/02/21 1454  For home use only DME 3 n 1  Once        09/02/21 [place] Follow up.   Why: They will call you to set up a time to come out and work with you Contact information: [address], [place], [place] [address] Phone: [phone]                Consultations: Oncology  PCCM  Procedures/Studies:  CT Angio Chest Pulmonary Embolism (PE) W or WO Contrast  Result Date: 08/25/2021 CLINICAL DATA:  Pulmonary embolism (PE) suspected, high prob short of breath, ca patient, eval for PE EXAM: CT ANGIOGRAPHY CHEST WITH CONTRAST TECHNIQUE: Multidetector CT imaging of the chest was performed using the standard protocol during bolus administration of intravenous contrast. Multiplanar CT image reconstructions and MIPs were obtained to evaluate the vascular anatomy. RADIATION DOSE REDUCTION: This exam was performed according to the departmental dose-optimization program which includes automated exposure control, adjustment of the mA and/or kV according to patient size and/or use of iterative reconstruction technique. CONTRAST:  32mL OMNIPAQUE IOHEXOL 350 MG/ML SOLN COMPARISON:  CT  angiography chest 07/01/2021 FINDINGS: Cardiovascular: Fair opacification of the pulmonary arteries to the segmental level. Right middle lobe segmental subsegmental pulmonary embolus. Chronic enlarged right to left ventricular ratio 1.4. No significant pericardial effusion. The ascending thoracic aorta is enlarged in caliber measuring up to 4.6 cm. The descending thoracic aorta is normal in caliber. At least mild atherosclerotic plaque of the thoracic aorta. Left anterior descending coronary artery calcifications. Mediastinum/Nodes: Stable internal mammary 0.5 cm lymph node on the left (5:86). Stable other inferior measuring 0.3 cm (5:121). Asymmetrically prominent left axillary lymph nodes compared to the right. No enlarged mediastinal, hilar, or axillary lymph nodes. Thyroid gland, trachea, and esophagus demonstrate no significant findings. Lungs/Pleura: Trace biapical paraseptal emphysematous changes. Biapical pleural/pulmonary scarring. No focal consolidation. Stable scattered pulmonary micronodule within the right lung. Stable 6 mm left lower lobe pulmonary nodule (6:118). Stable 6 mm  right lower lobe pulmonary nodule (6:78). No new pulmonary nodule. Pulmonary mass. No pleural effusion. No pneumothorax. Upper Abdomen: Redemonstration of innumerable ill-defined hypodensities within the hepatic parenchyma consistent with metastases. Musculoskeletal: No abdominal wall hernia or abnormality. No suspicious lytic or blastic osseous lesions. No acute displaced fracture. Multilevel degenerative changes of the spine. Review of the MIP images confirms the above findings. IMPRESSION: 1. Right middle lobe segmental and subsegmental pulmonary embolus. Enlarged right to left ventricular ratio 1.4 suggestive of right heart strain. No pulmonary infarction. 2. Stable ascending thoracic aorta aneurysm (4.6 cm). Ascending thoracic aortic aneurysm. Recommend semi-annual imaging followup by CTA or MRA and referral to cardiothoracic  surgery if not already obtained. This recommendation follows 2010 ACCF/AHA/AATS/ACR/ASA/SCA/SCAI/SIR/STS/SVM Guidelines for the Diagnosis and Management of Patients With Thoracic Aortic Disease. Circulation. 2010; 121: V035-K093. Aortic aneurysm NOS (ICD10-I71.9) 3. Hepatic metastases poorly visualized. 4. Asymmetrically prominent left axillary lymph nodes compared to the right. 5. Couple of stable subcentimeter left internal mammary lymph nodes. 6. Stable 6 mm bilateral lower lobe pulmonary nodules. Stable scattered pulmonary micronodules within the right lung. 7. Aortic Atherosclerosis (ICD10-I70.0) and Emphysema (ICD10-J43.9). These results were called by telephone at the time of interpretation on 08/25/2021 at 10:11 pm to provider [facility] , who verbally acknowledged these results. Electronically Signed   By: Iven Finn M.D.   On: 08/25/2021 22:21   MR BRAIN WO CONTRAST  Result Date: 08/30/2021 CLINICAL DATA:  Neuro deficit, stroke suspected EXAM: MRI HEAD WITHOUT CONTRAST TECHNIQUE: Multiplanar, multiecho pulse sequences of the brain and surrounding structures were obtained without intravenous contrast. COMPARISON:  None. FINDINGS: Brain: No definite restricted diffusion with ADC correlate to suggest acute or subacute infarct. Punctate area of increased signal on diffusion-weighted imaging in the right corona radiata, without ADC correlate (series 5, image 85), does correlate with a focal area of increased T2 signal and may represent late subacute infarct with ADC normalization versus T2 shine through. No acute hemorrhage, mass, mass effect, or midline shift. Minimal T2 hyperintense signal in the periventricular white matter, likely the sequela of very mild chronic small vessel ischemic disease. No foci of hemosiderin deposition to suggest remote hemorrhage. No hydrocephalus or extra-axial collection. Vascular: Normal flow voids. Skull and upper cervical spine: Normal marrow signal.  Sinuses/Orbits: Negative. Other: The mastoids are well aerated. IMPRESSION: 1. Possible punctate area of increased signal on diffusion-weighted imaging without ADC correlate, which may represent a late subacute infarct versus T2 shine through. 2. No acute intracranial process. Electronically Signed   By: Merilyn Baba M.D.   On: 08/30/2021 21:55   MR LUMBAR SPINE WO CONTRAST  Result Date: 08/28/2021 CLINICAL DATA:  Worsening back pain and radiculopathy. History of metastatic gastric cancer EXAM: MRI LUMBAR SPINE WITHOUT CONTRAST TECHNIQUE: Multiplanar, multisequence MR imaging of the lumbar spine was performed. No intravenous contrast was administered. COMPARISON:  08/24/2021 CT scan FINDINGS: Despite efforts by the technologist and patient, motion artifact is present on today's exam and could not be eliminated. This reduces exam sensitivity and specificity. Segmentation: The lowest lumbar type non-rib-bearing vertebra is labeled as L5. Alignment:  No vertebral subluxation is observed. Vertebrae: Mild degenerative endplate findings. Several small hemangiomas are observed. Faintly reduced T1 signal in the marrow although not pathologically reduced, with marrow generally greater intervertebral disc signal on the T1 weighted images. Conus medullaris and cauda equina: Conus extends to the L1-2 level. Conus and cauda equina appear normal. Paraspinal and other soft tissues: There is a new large complex fluid collection in  the right iliacus muscle, with internal fluid-fluid level superiorly on image 28 series 12, and a larger inferior component measuring at least 6 cm in diameter and likely substantially larger. This tracks beyond the inferior-most images of today's exam and is associated with abnormal infiltrative edema in the left iliacus muscle. Given that there was no abnormality in this vicinity on 08/24/2021, and given the potential internal hematocrit levels, I tend to favor acute hematoma over iliacus muscle  abscess. There is some associated edema tracking along the right retroperitoneum. The patient has known pathologic retroperitoneal adenopathy as on recent CT scan. Disc levels: L1-2: Unremarkable. L2-3: No impingement.  Mild disc bulge. L3-4: No impingement.  Mild disc bulge. L4-5: Moderate central narrowing of the thecal sac due to disc bulge and ligamentum flavum redundancy. L5-S1: No impingement.  Bilateral degenerative facet arthropathy. IMPRESSION: 1. Large new complex right iliacus muscle fluid collection, rapid onset favoring hematoma over abscess. There is associated edema in the iliacus muscle and along the adjacent right retroperitoneum. 2. Moderate central narrowing of the thecal sac at L4-5 due to disc bulge and ligamentum flavum redundancy. 3. Retroperitoneal adenopathy as shown on recent CT. Electronically Signed   By: Van Clines M.D.   On: 08/28/2021 18:24   MR PELVIS WO CONTRAST  Result Date: 08/28/2021 CLINICAL DATA:  Right groin pain.  Metastatic gastric cancer. EXAM: MRI PELVIS WITHOUT CONTRAST TECHNIQUE: Multiplanar multisequence MR imaging of the pelvis was performed. No intravenous contrast was administered. COMPARISON:  Radiographs 08/28/2021 and CT pelvis from 08/24/2021 in addition to lumbar MRI from 08/28/2021 FINDINGS: Despite efforts by the technologist and patient, motion artifact is present on today's exam and could not be eliminated. This reduces exam sensitivity and specificity. OSSEOUS STRUCTURES AND JOINTS: No fracture or compelling evidence of regional osseous metastatic disease. No SI joint or hip joint effusion. MUSCULAR STRUCTURES: 13.6 by 6.5 by 5.4 cm (volume = 250 cm^3) right iliacus muscle hematoma with internal hematocrit levels, and associated edema tracking in the iliacus muscle and in the surrounding retroperitoneum and along the right pelvic sidewall. There is also a 6.2 by 2.3 by 2.4 cm (volume = 18 cm^3) left iliacus muscle hematoma with surrounding  iliacus muscle edema. Bilateral direct inguinal hernias are present. The left hernia contains part of the proximal sigmoid colon, the right hernia appears to contain a small amount of fluid and adipose tissue currently although previously contained a loop of small bowel. There is trace edema in a symmetric manner along the hip adductor musculature. Other: Prostate gland grossly unremarkable. Urinary bladder unremarkable. IMPRESSION: 1. Approximally 215 cc right iliacus muscle hematoma. 18 cc left iliacus muscle hematoma. Both are associated with edema in the iliacus muscles. 2. The right iliacus muscle hematoma is associated with edema tracking in the surrounding retroperitoneum and along the right pelvic sidewall and right lateral abdominal wall musculature. 3. Bilateral direct inguinal hernias. The left hernia contains part of the colon in the right hernia contains adipose tissue and a small amount of fluid. Electronically Signed   By: Van Clines M.D.   On: 08/28/2021 19:10   IR IVC FILTER PLMT / S&I Burke Keels GUID/MOD SED  Result Date: 08/30/2021 INDICATION: 81 year old gentleman with history of DVT and retroperitoneal hemorrhage presents to IR for filter placement EXAM: Ultrasound fluoroscopic guided IVC filter placement MEDICATIONS: None. ANESTHESIA/SEDATION: Moderate (conscious) sedation was employed during this procedure. A total of Versed 1 mg and Fentanyl 50 mcg was administered intravenously by the radiology nurse. Total intra-service moderate  Sedation Time: 10 minutes. The patient's level of consciousness and vital signs were monitored continuously by radiology nursing throughout the procedure under my direct supervision. FLUOROSCOPY TIME:  Fluoroscopy Time: 2 minutes 30 seconds (110 mGy). COMPLICATIONS: None immediate. PROCEDURE: Informed written consent was obtained from the patient after a thorough discussion of the procedural risks, benefits and alternatives. All questions were addressed.  Maximal Sterile Barrier Technique was utilized including caps, mask, sterile gowns, sterile gloves, sterile drape, hand hygiene and skin antiseptic. A timeout was performed prior to the initiation of the procedure. Patient positioned supine on the angiography table. Right neck prepped and draped in usual sterile fashion. All elements of maximal sterile barrier were utilized including, cap, mask, sterile gown, sterile gloves, large sterile drape, hand scrubbing and 2% Chlorhexidine for skin cleaning. The right internal jugular vein was evaluated with ultrasound and shown to be patent. A permanent ultrasound image was obtained and placed in the patient's medical record. Using sterile gel and a sterile probe cover, the right internal jugular vein was entered with a 21 ga needle during real time ultrasound guidance. 21 gauge needle exchanged for a transitional dilator set over 0.018 inch guidewire. Transitional dilator set exchanged for 10 fr sheath over 0.035 inch guidewire. Sheath placed to the infrarenal IVC and venogram performed to delineate the position of the renal veins. Retrievable Denali IVC filter deployed in the infrarenal location. Sheath removed and hemostasis achieved with manual compression. IMPRESSION: Retrievable Denali IVC filter placement as above. PLAN: This IVC filter is potentially retrievable. The patient will be assessed for filter retrieval by Interventional Radiology in approximately 8-12 weeks. Further recommendations regarding filter retrieval, continued surveillance or declaration of device permanence, will be made at that time. Electronically Signed   By: Miachel Roux M.D.   On: 08/30/2021 09:03   CT CHEST ABDOMEN PELVIS W CONTRAST  Addendum Date: 08/25/2021   ADDENDUM REPORT: 08/25/2021 11:30 ADDENDUM: Upper normal 10 mm short axis left thoracic inlet node on 04/02. 11 mm short axis left supraclavicular node on image 3/2 is upper normal to mildly enlarged. Electronically Signed   By:  Misty Stanley M.D.   On: 08/25/2021 11:30   Result Date: 08/25/2021 CLINICAL DATA:  Gastric cancer.  Staging. EXAM: CT CHEST, ABDOMEN, AND PELVIS WITH CONTRAST TECHNIQUE: Multidetector CT imaging of the chest, abdomen and pelvis was performed following the standard protocol during bolus administration of intravenous contrast. RADIATION DOSE REDUCTION: This exam was performed according to the departmental dose-optimization program which includes automated exposure control, adjustment of the mA and/or kV according to patient size and/or use of iterative reconstruction technique. CONTRAST:  161mL OMNIPAQUE IOHEXOL 300 MG/ML  SOLN COMPARISON:  07/01/2021. FINDINGS: CT CHEST FINDINGS Cardiovascular: The heart size is normal. No substantial pericardial effusion. Coronary artery calcification is evident. Mild atherosclerotic calcification is noted in the wall of the thoracic aorta. Ascending thoracic aorta measures 4.5 cm diameter. Right Port-A-Cath tip is positioned in the distal SVC. Mediastinum/Nodes: 16 mm short axis precarinal node has increased from 6 mm (remeasured) previously. 10 mm short axis right hilar lymph node is mildly progressive in the interval. The esophagus has normal imaging features. There is no axillary lymphadenopathy. Lungs/Pleura: Biapical pleuroparenchymal scarring noted. Numerous bilateral pulmonary nodules identified, similar to prior. Index right lower pulmonary nodule measures 6 mm on image 72/5. Index nodule posterior left lower lobe is 6 mm on 83/5. Index right lower lobe nodule measuring 9 mm identified on image 90/5. Index nodule left lower lobe on 01/12/5  measures 5 mm. No focal consolidation.  Trace left pleural effusion evident. Musculoskeletal: No worrisome lytic or sclerotic osseous abnormality. CT ABDOMEN PELVIS FINDINGS Hepatobiliary: Innumerable hepatic metastases are again noted. One of the more dominant lesions is identified in the lateral segment left liver on image 67/2  measuring 4.8 x 2.7 cm today compared to 4.3 x 2.5 cm when remeasured in a similar fashion on the prior study. Another dominant lesion identified in segment IV measures 5.0 x 4.1 cm on 59/2, clearly increased from 2.7 x 1.8 cm when remeasured on the prior study. 3.0 cm posterior right hepatic lobe lesion on 57/2 was 2.2 cm previously (remeasured). 2.5 cm lesion in the medial right liver adjacent to the IVC has increased from 1.1 cm previously. 6.5 x 4.0 cm lesion inferior tip right liver (82/2 was measured on the previous study at 4.8 x 3.1 cm. There is no evidence for gallstones, gallbladder wall thickening, or pericholecystic fluid. No intrahepatic or extrahepatic biliary dilation. Pancreas: No focal mass lesion. No dilatation of the main duct. No intraparenchymal cyst. No peripancreatic edema. Spleen: Small infarct noted inferior spleen. (Axial 62/2 and coronal 100/6). Adrenals/Urinary Tract: No adrenal nodule or mass. Kidneys unremarkable. No evidence for hydroureter. The urinary bladder appears normal for the degree of distention. Stomach/Bowel: Stomach is decompressed. Duodenum is normally positioned as is the ligament of Treitz. No small bowel wall thickening. No small bowel dilatation. The terminal ileum is normal. The appendix is not well visualized, but there is no edema or inflammation in the region of the cecum. No gross colonic mass. No colonic wall thickening. Vascular/Lymphatic: There is mild atherosclerotic calcification of the abdominal aorta without aneurysm. Necrotic lymphadenopathy again identified in the hepatoduodenal ligament. 3.2 x 2.1 cm node visible on 63/2 was measured previously at 2.8 x 2.3 cm. 2.0 cm short axis aortocaval node seen on 72/2. 13 mm short axis right retrocrural node evident. No pelvic sidewall lymphadenopathy. Reproductive: The prostate gland and seminal vesicles are unremarkable. Other: No intraperitoneal free fluid. Musculoskeletal: Small right groin hernia contains  small bowel without complicating features. There is a left groin hernia containing a short segment of the sigmoid colon without complicating features. No worrisome lytic or sclerotic osseous abnormality. IMPRESSION: 1. Mild interval progression of mediastinal and right hilar lymphadenopathy. 2. Similar appearance of numerous bilateral pulmonary nodules compatible with metastatic disease. 3. Interval progression of innumerable hepatic metastases. 4. Similar to mildly progressive necrotic lymphadenopathy in the hepatoduodenal ligament and aortocaval space. 5. Small inferior splenic infarct, new since prior. 6. Bilateral groin hernias containing small bowel and colon, respectively, without complicating features. 7. 4.5 cm ascending thoracic aortic aneurysm. Recommend semi-annual imaging followup by CTA or MRA and referral to cardiothoracic surgery if not already obtained. 2010; 121: [id number]. 8. Aortic Atherosclerosis (ICD10-I70.0). Electronically Signed: By: Misty Stanley M.D. On: 08/25/2021 10:01   DG CHEST PORT 1 VIEW  Result Date: 08/26/2021 CLINICAL DATA:  Shortness of breath, decreased O2 sats EXAM: PORTABLE CHEST 1 VIEW COMPARISON:  Chest radiograph 08/25/2021 FINDINGS: A right chest wall port is again seen with the tip at the cavoatrial junction. The cardiomediastinal silhouette is stable. There is no focal consolidation or pulmonary edema. There is no pleural effusion or pneumothorax. Aeration is unchanged since 08/25/2021. There is no acute osseous abnormality. IMPRESSION: No radiographic evidence of acute cardiopulmonary process. No significant interval change since 08/25/2021 Electronically Signed   By: Valetta Mole M.D.   On: 08/26/2021 07:06   DG Chest [facility] [id number]  Result Date: 08/25/2021 CLINICAL DATA:  Weakness, fever, stage IV gastric cancer EXAM: PORTABLE CHEST 1 VIEW COMPARISON:  08/24/2021, 07/01/2021 FINDINGS: Two frontal views of the chest demonstrate right chest wall port tip overlying  superior vena cava. The cardiac silhouette is unremarkable. No acute airspace disease, effusion, or pneumothorax. The pulmonary nodule seen on preceding chest CT are not well visualized by x-ray. No acute bony abnormalities. IMPRESSION: 1. No acute airspace disease. 2. Please refer to CT chest performed 08/24/2021 describing intrathoracic metastatic disease not apparent by x-ray. Electronically Signed   By: Randa Ngo M.D.   On: 08/25/2021 18:13   ECHOCARDIOGRAM COMPLETE  Result Date: 08/26/2021    ECHOCARDIOGRAM REPORT   Patient Name:   Billy [name] Date of Exam: 08/26/2021 Medical Rec #:  [id number]      Height:       73.0 in Accession #:    [id number]     Weight:       184.3 lb Date of Birth:  06/20/1941      BSA:          2.078 m Patient Age:    35 years       BP:           108/63 mmHg Patient Gender: M              HR:           76 bpm. Exam Location:  Inpatient Procedure: 2D Echo Indications:    Pulmonary embolism  History:        Patient has prior history of Echocardiogram examinations, most                 recent 08/16/2021. Previous Myocardial Infarction; Risk                 Factors:Dyslipidemia and Hypertension.  Sonographer:    Arlyss Gandy Referring Phys: [place]  1. Left ventricular ejection fraction, by estimation, is 55 to 60%. The left ventricle has normal function. The left ventricle has no regional wall motion abnormalities. There is mild concentric left ventricular hypertrophy. Left ventricular diastolic parameters are consistent with Grade I diastolic dysfunction (impaired relaxation).  2. Right ventricular systolic function is normal. The right ventricular size is mildly enlarged. There is normal pulmonary artery systolic pressure.  3. Right atrial size was moderately dilated.  4. The mitral valve is grossly normal. No evidence of mitral valve regurgitation.  5. The aortic valve is grossly normal. Aortic valve regurgitation is trivial.  6. 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  7. The inferior vena cava is normal in size with greater than 50% respiratory variability, suggesting right atrial pressure of 3 mmHg.  8. Cannot rule out PFO. Conclusion(s)/Recommendation(s): So significant changes compared to echo 08/16/2021; Recommend annual surveillance echo for ascending aortic aneurysm. FINDINGS  Left Ventricle: Left ventricular ejection fraction, by estimation, is 55 to 60%. The left ventricle has normal function. The left ventricle has no regional wall motion abnormalities. The left ventricular internal cavity size was normal in size. There is  mild concentric left ventricular hypertrophy. Left ventricular diastolic parameters are consistent with Grade I diastolic dysfunction (impaired relaxation). Right Ventricle: The right ventricular size is mildly enlarged. No increase in right ventricular wall thickness. Right ventricular systolic function is normal. There is normal pulmonary artery systolic pressure. The tricuspid regurgitant velocity is 2.50  m/s, and with an assumed right atrial pressure of 3 mmHg, the estimated right ventricular systolic  pressure is 28.0 mmHg. Left Atrium: Left atrial size was normal in size. Right Atrium: Right atrial size was moderately dilated. Pericardium: There is no evidence of pericardial effusion. Mitral Valve: The mitral valve is grossly normal. No evidence of mitral valve regurgitation. Tricuspid Valve: The tricuspid valve is grossly normal. Tricuspid valve regurgitation is mild. Aortic Valve: The aortic valve is grossly normal. Aortic valve regurgitation is trivial. Aortic valve mean gradient measures 5.0 mmHg. Aortic valve peak gradient measures 9.5 mmHg. Aortic valve area, by VTI measures 2.64 cm. Pulmonic Valve: The pulmonic valve was grossly normal. Pulmonic valve regurgitation is not visualized. Aorta: Ascending aortic aneurysm 4.6 cm. Venous: The inferior vena cava is normal in size with greater than 50% respiratory  variability, suggesting right atrial pressure of 3 mmHg. IAS/Shunts: Cannot rule out PFO.  LEFT VENTRICLE PLAX 2D LVIDd:         4.60 cm   Diastology LVIDs:         3.30 cm   LV e' medial:    5.66 cm/s LV PW:         1.10 cm   LV E/e' medial:  14.2 LV IVS:        1.10 cm   LV e' lateral:   7.51 cm/s LVOT diam:     2.00 cm   LV E/e' lateral: 10.7 LV SV:         81 LV SV Index:   39 LVOT Area:     3.14 cm  RIGHT VENTRICLE             IVC RV Basal diam:  4.90 cm     IVC diam: 2.20 cm RV Mid diam:    3.60 cm RV S prime:     14.60 cm/s TAPSE (M-mode): 2.6 cm LEFT ATRIUM             Index        RIGHT ATRIUM           Index LA diam:        4.10 cm 1.97 cm/m   RA Area:     32.00 cm LA Vol (A2C):   51.4 ml 24.73 ml/m  RA Volume:   121.00 ml 58.22 ml/m LA Vol (A4C):   58.0 ml 27.91 ml/m LA Biplane Vol: 57.5 ml 27.67 ml/m  AORTIC VALVE AV Area (Vmax):    2.81 cm AV Area (Vmean):   2.63 cm AV Area (VTI):     2.64 cm AV Vmax:           154.50 cm/s AV Vmean:          103.500 cm/s AV VTI:            0.308 m AV Peak Grad:      9.5 mmHg AV Mean Grad:      5.0 mmHg LVOT Vmax:         138.00 cm/s LVOT Vmean:        86.650 cm/s LVOT VTI:          0.259 m LVOT/AV VTI ratio: 0.84  AORTA Ao Root diam: 3.90 cm Ao Asc diam:  4.60 cm MITRAL VALVE                TRICUSPID VALVE MV Area (PHT): 2.76 cm     TR Peak grad:   25.0 mmHg MV Decel Time: 275 msec     TR Vmax:        250.00 cm/s MV E velocity:  80.60 cm/s MV A velocity: 113.00 cm/s  SHUNTS MV E/A ratio:  0.71         Systemic VTI:  0.26 m                             Systemic Diam: 2.00 cm Phineas Inches Electronically signed by Phineas Inches Signature Date/Time: 08/26/2021/11:48:38 AM    Final    ECHOCARDIOGRAM COMPLETE  Result Date: 08/16/2021    ECHOCARDIOGRAM REPORT   Patient Name:   Billy [name] Date of Exam: 08/16/2021 Medical Rec #:  [id number]      Height:       73.0 in Accession #:    [id number]     Weight:       189.6 lb Date of Birth:  24-Apr-1941      BSA:           2.103 m Patient Age:    34 years       BP:           124/90 mmHg Patient Gender: M              HR:           73 bpm. Exam Location:  [facility] Procedure: 2D Echo, Cardiac Doppler, Color Doppler and Strain Analysis Indications:    Z09 Chemo  History:        Patient has no prior history of Echocardiogram examinations.                 Previous Myocardial Infarction; Risk Factors:Hypertension and                 Dyslipidemia.  Sonographer:    Wilford Sports Rodgers-Jones RDCS Referring Phys: [id number] [place]  1. Left ventricular ejection fraction, by estimation, is 60 to 65%. The left ventricle has normal function. The left ventricle has no regional wall motion abnormalities. Left ventricular diastolic parameters are consistent with Grade I diastolic dysfunction (impaired relaxation). The average left ventricular global longitudinal strain is -23.0 %. The global longitudinal strain is normal.  2. Right ventricular systolic function is normal. The right ventricular size is mildly enlarged. There is normal pulmonary artery systolic pressure. The estimated right ventricular systolic pressure is 56.3 mmHg.  3. Right atrial size was mildly dilated.  4. The mitral valve is normal in structure. No evidence of mitral valve regurgitation. No evidence of mitral stenosis.  5. The aortic valve is normal in structure. There is mild calcification of the aortic valve. There is mild thickening of the aortic valve. Aortic valve regurgitation is mild. Aortic valve sclerosis/calcification is present, without any evidence of aortic stenosis.  6. Aortic dilatation noted. There is moderate dilatation of the ascending aorta, measuring 46 mm.  7. The inferior vena cava is normal in size with greater than 50% respiratory variability, suggesting right atrial pressure of 3 mmHg. FINDINGS  Left Ventricle: Left ventricular ejection fraction, by estimation, is 60 to 65%. The left ventricle has normal function. The left ventricle has no  regional wall motion abnormalities. The average left ventricular global longitudinal strain is -23.0 %. The global longitudinal strain is normal. The left ventricular internal cavity size was normal in size. There is no left ventricular hypertrophy. Left ventricular diastolic parameters are consistent with Grade I diastolic dysfunction (impaired relaxation). Right Ventricle: The right ventricular size is mildly enlarged. No increase in right ventricular wall thickness. Right ventricular systolic function is normal.  There is normal pulmonary artery systolic pressure. The tricuspid regurgitant velocity is 2.74  m/s, and with an assumed right atrial pressure of 3 mmHg, the estimated right ventricular systolic pressure is 81.8 mmHg. Left Atrium: Left atrial size was normal in size. Right Atrium: Right atrial size was mildly dilated. Pericardium: There is no evidence of pericardial effusion. Mitral Valve: The mitral valve is normal in structure. No evidence of mitral valve regurgitation. No evidence of mitral valve stenosis. Tricuspid Valve: The tricuspid valve is normal in structure. Tricuspid valve regurgitation is mild . No evidence of tricuspid stenosis. Aortic Valve: The aortic valve is normal in structure. There is mild calcification of the aortic valve. There is mild thickening of the aortic valve. Aortic valve regurgitation is mild. Aortic regurgitation PHT measures 558 msec. Aortic valve sclerosis/calcification is present, without any evidence of aortic stenosis. Pulmonic Valve: The pulmonic valve was normal in structure. Pulmonic valve regurgitation is trivial. No evidence of pulmonic stenosis. Aorta: Aortic dilatation noted. There is moderate dilatation of the ascending aorta, measuring 46 mm. Venous: The inferior vena cava is normal in size with greater than 50% respiratory variability, suggesting right atrial pressure of 3 mmHg. IAS/Shunts: No atrial level shunt detected by color flow Doppler.  LEFT  VENTRICLE PLAX 2D LVIDd:         4.40 cm   Diastology LVIDs:         2.70 cm   LV e' medial:    7.94 cm/s LV PW:         1.10 cm   LV E/e' medial:  9.8 LV IVS:        1.10 cm   LV e' lateral:   11.70 cm/s LVOT diam:     2.00 cm   LV E/e' lateral: 6.6 LV SV:         78 LV SV Index:   37        2D Longitudinal Strain LVOT Area:     3.14 cm  2D Strain GLS Avg:     -23.0 %  RIGHT VENTRICLE RV Basal diam:  4.90 cm RV S prime:     20.40 cm/s TAPSE (M-mode): 2.5 cm LEFT ATRIUM             Index        RIGHT ATRIUM           Index LA diam:        4.30 cm 2.04 cm/m   RA Area:     21.50 cm LA Vol (A2C):   63.8 ml 30.33 ml/m  RA Volume:   70.90 ml  33.71 ml/m LA Vol (A4C):   33.8 ml 16.07 ml/m LA Biplane Vol: 49.2 ml 23.39 ml/m  AORTIC VALVE LVOT Vmax:   154.00 cm/s LVOT Vmean:  107.500 cm/s LVOT VTI:    0.247 m AI PHT:      558 msec  AORTA Ao Root diam: 4.20 cm Ao Asc diam:  4.75 cm MITRAL VALVE                TRICUSPID VALVE MV Area (PHT): 4.15 cm     TR Peak grad:   30.0 mmHg MV Decel Time: 183 msec     TR Vmax:        274.00 cm/s MV E velocity: 77.60 cm/s MV A velocity: 108.00 cm/s  SHUNTS MV E/A ratio:  0.72         Systemic VTI:  0.25 m  Systemic Diam: 2.00 cm Candee Furbish MD Electronically signed by Candee Furbish MD Signature Date/Time: 08/16/2021/3:37:30 PM    Final    DG HIP UNILAT WITH PELVIS 2-3 VIEWS RIGHT  Result Date: 08/28/2021 CLINICAL DATA:  Severe right-sided hip pain EXAM: DG HIP (WITH OR WITHOUT PELVIS) 2-3V RIGHT COMPARISON:  CT 07/01/2021 FINDINGS: Residual enteral contrast in the colon and rectum. SI joints are non widened. Pubic symphysis and rami appear intact. No definitive fracture or malalignment. IMPRESSION: No acute osseous abnormality Electronically Signed   By: Donavan Foil M.D.   On: 08/28/2021 16:50   VAS [place] LOWER EXTREMITY VENOUS (DVT)  Result Date: 08/30/2021  Lower Venous DVT Study Patient Name:  Billy [name]  Date of Exam:   08/29/2021 Medical Rec  #: [id number]       Accession #:    [id number] Date of Birth: Dec 31, 1940       Patient Gender: M Patient Age:   89 years Exam Location:  [facility] Procedure:      VAS [place] LOWER EXTREMITY VENOUS (DVT) Referring Phys: Christinia Gully --------------------------------------------------------------------------------  Indications: Pain. Other Indications: PE dx'd on 08/25/2021. Risk Factors: CA patient on chemotherapy. Comparison Study: No previous exams Performing Technologist: Jody Hill RVT, RDMS  Examination Guidelines: A complete evaluation includes B-mode imaging, spectral Doppler, color Doppler, and power Doppler as needed of all accessible portions of each vessel. Bilateral testing is considered an integral part of a complete examination. Limited examinations for reoccurring indications may be performed as noted. The reflux portion of the exam is performed with the patient in reverse Trendelenburg.  +---------+---------------+---------+-----------+----------+-------------------+  RIGHT     Compressibility Phasicity Spontaneity Properties Thrombus Aging       +---------+---------------+---------+-----------+----------+-------------------+  CFV       Full            Yes       Yes                                         +---------+---------------+---------+-----------+----------+-------------------+  SFJ       Full                                                                  +---------+---------------+---------+-----------+----------+-------------------+  FV Prox   Full            Yes       Yes                                         +---------+---------------+---------+-----------+----------+-------------------+  FV Mid    Full            Yes       Yes                                         +---------+---------------+---------+-----------+----------+-------------------+  FV Distal Full            Yes       Yes                                          +---------+---------------+---------+-----------+----------+-------------------+  PFV       Full                                                                  +---------+---------------+---------+-----------+----------+-------------------+  POP       None            No        No                     Acute                +---------+---------------+---------+-----------+----------+-------------------+  PTV       Partial         No        No                     Acute - one of                                                                   paired               +---------+---------------+---------+-----------+----------+-------------------+  PERO      None            No        No                     Acute                +---------+---------------+---------+-----------+----------+-------------------+  Gastroc   None            No        No                     Acute                +---------+---------------+---------+-----------+----------+-------------------+  SSV       Partial         No        No                     Age indeterminate                                                                -origin and prox                                                                 portion              +---------+---------------+---------+-----------+----------+-------------------+   +---------+---------------+---------+-----------+----------+--------------+  LEFT  Compressibility Phasicity Spontaneity Properties Thrombus Aging  +---------+---------------+---------+-----------+----------+--------------+  CFV       Full            Yes       Yes                                    +---------+---------------+---------+-----------+----------+--------------+  SFJ       Full                                                             +---------+---------------+---------+-----------+----------+--------------+  FV Prox   Full            Yes       Yes                                     +---------+---------------+---------+-----------+----------+--------------+  FV Mid    Full            Yes       Yes                                    +---------+---------------+---------+-----------+----------+--------------+  FV Distal Full            Yes       Yes                                    +---------+---------------+---------+-----------+----------+--------------+  PFV       Full                                                             +---------+---------------+---------+-----------+----------+--------------+  POP       Full            Yes       Yes                                    +---------+---------------+---------+-----------+----------+--------------+  PTV       Full                                                             +---------+---------------+---------+-----------+----------+--------------+  PERO      Full                                                             +---------+---------------+---------+-----------+----------+--------------+  Summary: BILATERAL: -No evidence of popliteal cyst, bilaterally. RIGHT: - Findings consistent with acute deep vein thrombosis involving the right popliteal vein, right posterior tibial veins, right peroneal veins, and right gastrocnemius veins. - Findings consistent with age indeterminate superficial vein thrombosis involving the right small saphenous vein.  LEFT: - There is no evidence of deep vein thrombosis in the lower extremity. - There is no evidence of superficial venous thrombosis.  *See table(s) above for measurements and observations. Electronically signed by Orlie Pollen on 08/30/2021 at 6:42:56 AM.    Final      Subjective: - no chest pain, shortness of breath, no abdominal pain, nausea or vomiting.   Discharge Exam: BP 135/87 (BP Location: Left Arm)    Pulse 62    Temp 98.2 F (36.8 C) (Oral)    Resp 18    Ht 6\' 1"  (1.854 m)    Wt 83.6 kg    SpO2 93%    BMI 24.32 kg/m   General: Pt is alert, awake, not in acute  distress Cardiovascular: RRR, S1/S2 +, no rubs, no gallops Respiratory: CTA bilaterally, no wheezing, no rhonchi   The results of significant diagnostics from this hospitalization (including imaging, microbiology, ancillary and laboratory) are listed below for reference.     Microbiology: Recent Results (from the past 240 hour(s))  Blood Culture (routine x 2)     Status: None   Collection Time: 08/25/21  5:48 PM   Specimen: Right Antecubital; Blood  Result Value Ref Range Status   Specimen Description   Final    RIGHT ANTECUBITAL Performed at [facility] [address], [place], [place] [address]    Special Requests   Final    BOTTLES DRAWN AEROBIC AND ANAEROBIC Blood Culture results may not be optimal due to an inadequate volume of blood received in culture bottles Performed at [facility] [address], [place], [place] [address]    Culture   Final    NO GROWTH 5 DAYS Performed at [facility], [place] [address] [place], [place] [address]    Report Status 08/30/2021 FINAL  Final  Blood Culture (routine x 2)     Status: None   Collection Time: 08/25/21  5:53 PM   Specimen: Left Antecubital; Blood  Result Value Ref Range Status   Specimen Description   Final    LEFT ANTECUBITAL Performed at [facility] [address] [place], [place] [address]    Special Requests   Final    BOTTLES DRAWN AEROBIC AND ANAEROBIC Blood Culture results may not be optimal due to an inadequate volume of blood received in culture bottles Performed at [facility] [address] [place], [place] [address]    Culture   Final    NO GROWTH 5 DAYS Performed at [facility], [place] [address], [place], [place] [address]    Report Status 08/30/2021 FINAL  Final  Resp Panel by RT-PCR (Flu A&B, Covid) Nasopharyngeal Swab     Status: None   Collection Time: 08/26/21  3:08 AM   Specimen: Nasopharyngeal Swab;  Nasopharyngeal(NP) swabs in vial transport medium  Result Value Ref Range Status   SARS Coronavirus 2 by RT PCR NEGATIVE NEGATIVE Final    Comment: (NOTE) SARS-CoV-2 target nucleic acids are NOT DETECTED.  The SARS-CoV-2 RNA is generally detectable in upper respiratory specimens during the acute phase of infection. The lowest concentration of SARS-CoV-2 viral copies this assay can detect  is 138 copies/mL. A negative result does not preclude SARS-Cov-2 infection and should not be used as the sole basis for treatment or other patient management decisions. A negative result may occur with  improper specimen collection/handling, submission of specimen other than nasopharyngeal swab, presence of viral mutation(s) within the areas targeted by this assay, and inadequate number of viral copies(<138 copies/mL). A negative result must be combined with clinical observations, patient history, and epidemiological information. The expected result is Negative.  Fact Sheet for Patients:  EntrepreneurPulse.com.au  Fact Sheet for Healthcare Providers:  IncredibleEmployment.be  This test is no t yet approved or cleared by the [place] FDA and  has been authorized for detection and/or diagnosis of SARS-CoV-2 by FDA under an Emergency Use Authorization (EUA). This EUA will remain  in effect (meaning this test can be used) for the duration of the COVID-19 declaration under Section 564(b)(1) of the Act, 21 U.S.C.section 360bbb-3(b)(1), unless the authorization is terminated  or revoked sooner.       Influenza A by PCR NEGATIVE NEGATIVE Final   Influenza B by PCR NEGATIVE NEGATIVE Final    Comment: (NOTE) The Xpert Xpress SARS-CoV-2/FLU/RSV plus assay is intended as an aid in the diagnosis of influenza from Nasopharyngeal swab specimens and should not be used as a sole basis for treatment. Nasal washings and aspirates are unacceptable for Xpert Xpress  SARS-CoV-2/FLU/RSV testing.  Fact Sheet for Patients: EntrepreneurPulse.com.au  Fact Sheet for Healthcare Providers: IncredibleEmployment.be  This test is not yet approved or cleared by the [place] FDA and has been authorized for detection and/or diagnosis of SARS-CoV-2 by FDA under an Emergency Use Authorization (EUA). This EUA will remain in effect (meaning this test can be used) for the duration of the COVID-19 declaration under Section 564(b)(1) of the Act, 21 U.S.C. section 360bbb-3(b)(1), unless the authorization is terminated or revoked.  Performed at [facility], [place] [address] [place], [place] [address]   MRSA Next Gen by PCR, Nasal     Status: Abnormal   Collection Time: 08/26/21  8:59 AM   Specimen: Nasal Mucosa; Nasal Swab  Result Value Ref Range Status   MRSA by PCR Next Gen NEGATIVE (A) NOT DETECTED Final    Comment: Performed at [facility], [place] [address] [place], [place] [address]     Labs: Basic Metabolic Panel: Recent Labs  Lab 08/27/21 0905 08/31/21 0425 08/31/21 2009  NA 133* 136 134*  K 4.0 4.1 4.6  CL 106 105 102  CO2 20* 26 25  GLUCOSE 128* 90 126*  BUN 35* 32* 34*  CREATININE 1.02 0.67 0.80  CALCIUM 7.7* 8.0* 8.2*   Liver Function Tests: No results for input(s): AST, ALT, ALKPHOS, BILITOT, PROT, ALBUMIN in the last 168 hours. CBC: Recent Labs  Lab 08/30/21 0602 08/31/21 0425 08/31/21 2009 09/02/21 0359 09/03/21 0308  WBC 7.3 12.6* 16.2* 14.8* 14.2*  HGB 7.8* 7.6* 8.6* 7.6* 8.8*  HCT 24.3* 23.7* 26.3* 23.6* 27.4*  MCV 94.9 94.4 93.6 92.9 92.9  PLT 204 221 272 210 198   CBG: No results for input(s): GLUCAP in the last 168 hours. Hgb A1c No results for input(s): HGBA1C in the last 72 hours. Lipid Profile No results for input(s): CHOL, HDL, LDLCALC, TRIG, CHOLHDL, LDLDIRECT in the last 72 hours. Thyroid function studies No results for input(s):  TSH, T4TOTAL, T3FREE, THYROIDAB in the last 72 hours.  Invalid input(s): FREET3 Urinalysis    Component Value Date/Time   COLORURINE YELLOW 08/25/2021  [place] (A) 08/25/2021 2301   LABSPEC 1.041 (H) 08/25/2021 2301   PHURINE 5.0 08/25/2021 2301   GLUCOSEU NEGATIVE 08/25/2021 2301   GLUCOSEU NEGATIVE 07/28/2020 1051   HGBUR NEGATIVE 08/25/2021 2301   BILIRUBINUR NEGATIVE 08/25/2021 2301   BILIRUBINUR n 07/27/2015 1503   KETONESUR 5 (A) 08/25/2021 2301   PROTEINUR 30 (A) 08/25/2021 2301   UROBILINOGEN 0.2 07/28/2020 1051   NITRITE NEGATIVE 08/25/2021 2301   LEUKOCYTESUR NEGATIVE 08/25/2021 2301    FURTHER DISCHARGE INSTRUCTIONS:   Get Medicines reviewed and adjusted: Please take all your medications with you for your next visit with your Primary MD   Laboratory/radiological data: Please request your Primary MD to go over all hospital tests and procedure/radiological results at the follow up, please ask your Primary MD to get all Hospital records sent to his/her office.   In some cases, they will be blood work, cultures and biopsy results pending at the time of your discharge. Please request that your primary care M.D. goes through all the records of your hospital data and follows up on these results.   Also Note the following: If you experience worsening of your admission symptoms, develop shortness of breath, life threatening emergency, suicidal or homicidal thoughts you must seek medical attention immediately by calling 911 or calling your MD immediately  if symptoms less severe.   You must read complete instructions/literature along with all the possible adverse reactions/side effects for all the Medicines you take and that have been prescribed to you. Take any new Medicines after you have completely understood and accpet all the possible adverse reactions/side effects.    Do not drive when taking Pain medications or sleeping medications (Benzodaizepines)   Do  not take more than prescribed Pain, Sleep and Anxiety Medications. It is not advisable to combine anxiety,sleep and pain medications without talking with your primary care practitioner   Special Instructions: If you have smoked or chewed Tobacco  in the last 2 yrs please stop smoking, stop any regular Alcohol  and or any Recreational drug use.   Wear Seat belts while driving.   Please note: You were cared for by a hospitalist during your hospital stay. Once you are discharged, your primary care physician will handle any further medical issues. Please note that NO REFILLS for any discharge medications will be authorized once you are discharged, as it is imperative that you return to your primary care physician (or establish a relationship with a primary care physician if you do not have one) for your post hospital discharge needs so that they can reassess your need for medications and monitor your lab values.  Time coordinating discharge: 35 minutes  SIGNED:  Marzetta Board, MD, PhD 09/03/2021, 8:27 AM

## 2021-09-03 NOTE — Plan of Care (Signed)
Pt for discharge home this morning.  AVS printed and reviewed with patient and spouse at bedside.  Follow up appointments and safety adjustments discussed in detail.  All questions addressed, verbalized understanding.  Equipment delivered to room for home use.  Spouse awaits arrival of additional family for assistance getting him in the home safely.   IV and telemetry removed.   Problem: Education: Goal: Knowledge of General Education information will improve Description: Including pain rating scale, medication(s)/side effects and non-pharmacologic comfort measures Outcome: Adequate for Discharge   Problem: Health Behavior/Discharge Planning: Goal: Ability to manage health-related needs will improve Outcome: Adequate for Discharge   Problem: Clinical Measurements: Goal: Ability to maintain clinical measurements within normal limits will improve Outcome: Adequate for Discharge Goal: Will remain free from infection Outcome: Adequate for Discharge Goal: Diagnostic test results will improve Outcome: Adequate for Discharge Goal: Respiratory complications will improve Outcome: Adequate for Discharge Goal: Cardiovascular complication will be avoided Outcome: Adequate for Discharge   Problem: Activity: Goal: Risk for activity intolerance will decrease Outcome: Adequate for Discharge   Problem: Nutrition: Goal: Adequate nutrition will be maintained Outcome: Adequate for Discharge   Problem: Coping: Goal: Level of anxiety will decrease Outcome: Adequate for Discharge   Problem: Elimination: Goal: Will not experience complications related to bowel motility Outcome: Adequate for Discharge Goal: Will not experience complications related to urinary retention Outcome: Adequate for Discharge   Problem: Pain Managment: Goal: General experience of comfort will improve Outcome: Adequate for Discharge   Problem: Safety: Goal: Ability to remain free from injury will improve Outcome:  Adequate for Discharge   Problem: Skin Integrity: Goal: Risk for impaired skin integrity will decrease Outcome: Adequate for Discharge

## 2021-09-04 ENCOUNTER — Other Ambulatory Visit: Payer: Self-pay | Admitting: Nurse Practitioner

## 2021-09-05 ENCOUNTER — Encounter: Payer: Self-pay | Admitting: Hematology

## 2021-09-05 LAB — TYPE AND SCREEN
ABO/RH(D): AB POS
Antibody Screen: NEGATIVE
Unit division: 0

## 2021-09-05 LAB — BPAM RBC
Blood Product Expiration Date: 202302152359
ISSUE DATE / TIME: 202301272252
Unit Type and Rh: 6200

## 2021-09-05 MED ORDER — MORPHINE SULFATE ER 15 MG PO TBCR: 15.0000 mg | EXTENDED_RELEASE_TABLET | Freq: Two times a day (BID) | ORAL | 0 refills | Status: DC

## 2021-09-06 DIAGNOSIS — E782 Mixed hyperlipidemia: Secondary | ICD-10-CM | POA: Diagnosis not present

## 2021-09-06 DIAGNOSIS — C787 Secondary malignant neoplasm of liver and intrahepatic bile duct: Secondary | ICD-10-CM | POA: Diagnosis not present

## 2021-09-06 DIAGNOSIS — I2699 Other pulmonary embolism without acute cor pulmonale: Secondary | ICD-10-CM | POA: Diagnosis not present

## 2021-09-06 DIAGNOSIS — Z9181 History of falling: Secondary | ICD-10-CM | POA: Diagnosis not present

## 2021-09-06 DIAGNOSIS — I2510 Atherosclerotic heart disease of native coronary artery without angina pectoris: Secondary | ICD-10-CM | POA: Diagnosis not present

## 2021-09-06 DIAGNOSIS — F419 Anxiety disorder, unspecified: Secondary | ICD-10-CM | POA: Diagnosis not present

## 2021-09-06 DIAGNOSIS — C772 Secondary and unspecified malignant neoplasm of intra-abdominal lymph nodes: Secondary | ICD-10-CM | POA: Diagnosis not present

## 2021-09-06 DIAGNOSIS — C7800 Secondary malignant neoplasm of unspecified lung: Secondary | ICD-10-CM | POA: Diagnosis not present

## 2021-09-06 DIAGNOSIS — I10 Essential (primary) hypertension: Secondary | ICD-10-CM | POA: Diagnosis not present

## 2021-09-06 DIAGNOSIS — Z85828 Personal history of other malignant neoplasm of skin: Secondary | ICD-10-CM | POA: Diagnosis not present

## 2021-09-06 DIAGNOSIS — C169 Malignant neoplasm of stomach, unspecified: Secondary | ICD-10-CM | POA: Diagnosis not present

## 2021-09-06 DIAGNOSIS — Z87891 Personal history of nicotine dependence: Secondary | ICD-10-CM | POA: Diagnosis not present

## 2021-09-06 DIAGNOSIS — D62 Acute posthemorrhagic anemia: Secondary | ICD-10-CM | POA: Diagnosis not present

## 2021-09-06 DIAGNOSIS — I82431 Acute embolism and thrombosis of right popliteal vein: Secondary | ICD-10-CM | POA: Diagnosis not present

## 2021-09-06 DIAGNOSIS — I252 Old myocardial infarction: Secondary | ICD-10-CM | POA: Diagnosis not present

## 2021-09-07 ENCOUNTER — Telehealth (HOSPITAL_COMMUNITY): Payer: Self-pay | Admitting: *Deleted

## 2021-09-07 ENCOUNTER — Telehealth: Payer: Self-pay

## 2021-09-07 NOTE — Telephone Encounter (Signed)
Spoke with patient and scheduled a Televisit Palliative Consult for 09/12/21 @ 12:30 PM.  Consent obtained; updated Outlook/Netsmart/Team List and Epic.

## 2021-09-07 NOTE — Telephone Encounter (Signed)
Multiple attempts made post hospital to assess status post discharge.  Message left with f/u visit information and call back number, should he have questions regarding his care or appointments.

## 2021-09-08 ENCOUNTER — Encounter: Payer: Medicare HMO | Admitting: Nutrition

## 2021-09-08 ENCOUNTER — Ambulatory Visit: Payer: Medicare HMO

## 2021-09-08 ENCOUNTER — Ambulatory Visit: Payer: Medicare HMO | Admitting: Nurse Practitioner

## 2021-09-08 ENCOUNTER — Other Ambulatory Visit: Payer: Medicare HMO

## 2021-09-12 ENCOUNTER — Encounter: Payer: Self-pay | Admitting: Hematology

## 2021-09-12 ENCOUNTER — Other Ambulatory Visit: Payer: Medicare HMO | Admitting: Family Medicine

## 2021-09-12 ENCOUNTER — Encounter: Payer: Self-pay | Admitting: Family Medicine

## 2021-09-12 ENCOUNTER — Other Ambulatory Visit: Payer: Self-pay

## 2021-09-12 VITALS — BP 118/78 | HR 60 | Temp 97.8°F | Resp 18

## 2021-09-12 DIAGNOSIS — S7010XA Contusion of unspecified thigh, initial encounter: Secondary | ICD-10-CM

## 2021-09-12 DIAGNOSIS — I7121 Aneurysm of the ascending aorta, without rupture: Secondary | ICD-10-CM

## 2021-09-12 DIAGNOSIS — I2609 Other pulmonary embolism with acute cor pulmonale: Secondary | ICD-10-CM

## 2021-09-12 DIAGNOSIS — Z515 Encounter for palliative care: Secondary | ICD-10-CM

## 2021-09-12 DIAGNOSIS — C161 Malignant neoplasm of fundus of stomach: Secondary | ICD-10-CM

## 2021-09-12 DIAGNOSIS — F4329 Adjustment disorder with other symptoms: Secondary | ICD-10-CM

## 2021-09-12 NOTE — Progress Notes (Signed)
[facility] Consult Note Telephone: [phone]  Fax: [phone]   Date of encounter: 09/12/21 12:45 PM phone, 2:30 PM in person visit PATIENT NAME: Billy [name] [id number]   [phone] (home)  DOB: 05-03-1941 MRN: [id number] PRIMARY CARE PROVIDER:    Biagio Borg, MD,  [place] [address] [phone]  REFERRING PROVIDER:   Biagio [name], [place],  [place] [address] [phone]  RESPONSIBLE PARTY:    Contact Information     Name Relation Home Work Mobile   Billy [name] Spouse [phone]  [phone]   Billy [name] Daughter   [phone]      Initially began visit by phone but pt was interested in completing MOST form so in person visit conducted.  I met face to face with patient and wife in their home. Palliative Care was asked to follow this patient by consultation request of  Billy Borg, MD to address advance care planning and complex medical decision making. This is the initial visit.          ASSESSMENT, SYMPTOM MANAGEMENT AND PLAN / RECOMMENDATIONS:   Palliative Care Encounter Educated on difference between Palliative and Hospice on spectrum. Completed MOST per pt's express wishes Follow up next Monday/Tuesday after pt's visit with Oncology Friday. Continue PT and encourage to look into rollator to help with further independence.   Metastatic gastric cancer Current pain from GI cancer and PE/DVT well managed on MS Contin 15 mg BID with OxyCodone for breakthrough pain. Continues treatment with chemo and radiation.  Acute PE/DVT Given tPA and Heparin for PE/DVT with subsequent hematoma development in bilateral iliopsoas. IVC filter placed. PT for RLE weakness  Hematoma of iliopsoas- VSS s/p iron and blood transfusion with no further evidence of bleed.  5.  Ascending thoracic aortic aneurysm Measures approximately 4.5  cm. Keep BP as close to or under SBP 140 and follow up CT in 6 months or prn if symptomatic   Follow up Palliative Care Visit: Palliative care will continue to follow for complex medical decision making, advance care planning, and clarification of goals. Return 4 weeks or prn.    This visit was coded based on medical decision making (MDM).  PPS: 60%  HOSPICE ELIGIBILITY/DIAGNOSIS: TBD  Chief Complaint:  [facility] received a referral to follow up with patient for chronic disease management, advance directive and defining/refining goals of care.   HISTORY OF PRESENT ILLNESS:  Billy [name] is a 81 y.o. year old male  with stage IV metastatic gastric cancer, CAD, acute DVT/PE  and hematoma of iliopsoas muscle following TPA then heparin for PE prior to IVC filter placement.  CT Chest, abd and pelvis 08/24/21 showed interval hilar lymphadenopathy growth, multiple liver lesions and probably metastatic disease in the lung.  Also identified ascending thoracic aortic aneurysm of 4.5 cm on CTA chest.  Bilateral ileopsoas hematomas right greater than left and bilateral direct inguinal hernias noted on MR pelvis.  No evidence of bony lesion in lumbar spine or pelvis. CMP 08/25/21 with AST elevated at 116/ALT at 71 and Alk phos elevated at 511, albumin low at 2.7.  On 08/31/21 iron low at 27, sat ratio 9, ferritin elevated at 636.  On 09/03/21 WBC elevated at 14.2, low RBC 2.95, Hgb 8.8, HCT 27.4% and normal PLT 198.  He was treated during this hospitalization on 08/25/21 after receiving immunotherapy for PE/DVT and circulatory shock.  He was given tPA and subsequently has the iliopsoas muscle bleeds which has led to nerve compression on the right.  He was treated with IV iron and blood transfusion. Fever thought to be related to PE and circulatory collapse. Pt states he had 1 chemo treatment that he had a lot of pain in chest and back requiring use of MS Contin to get relief.  He then  was started on Keytruda and Herceptin on 08/18/21 and had a reaction. He received initial  immunotherapy and states he didn't even get home before he had to turn around and go back with elevated temp, he was admitted to ICU and states he does not remember much of this but that he doesn't think he ever wants to go through that again.  He states if this is what he has to anticipate he doesn't think he wants to continue treatment but has scheduled follow up with Oncologist Dr Burr Medico this Friday.  Pt states he is unsteady on his right leg and where he used to get in 10,000 steps per his fitbit he now is only walking a couple of hundred steps.  Wife indicates that he is just watching tv, has no interest in reading or puzzles but he is having boredom.  On visit today pt indicated that he would like to be safe enough to putter around his garage or be able to grill out but did not feel safe.  Discussed the possibility of a rollator as he would have a seat if he began feeling weak or unsteady on his right leg.  He has home PT and therapist coming this afternoon, encouraged to discuss use of rollator with PT.  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  Encouraged pt to consider model building for boats or cars.  He states he hopes that he dies in his sleep when it is his time.  History obtained from review of EMR, interview with wife and Billy [name].  I reviewed available labs, medications, imaging, studies and related documents from the EMR.  Records reviewed and summarized above.   ROS General: NAD EYES: denies vision changes ENMT: denies dysphagia Cardiovascular: denies chest pain, denies DOE Pulmonary: denies cough, denies increased SOB Abdomen: endorses good appetite, denies constipation, endorses continence of bowel GU: denies dysuria, endorses continence of  urine MSK:  endorses increased weakness of RLE, 2 falls recently without injury Skin: denies rashes or wounds Neurological: denies pain since he has been on MS Contin, denies insomnia Psych: Endorses some depressed mood and anxiety coping with diagnosis Heme/lymph/immuno: denies bruises, abnormal bleeding  Physical Exam: Current and past weights: 08/25/21 weight 184 lbs 4.8 oz, weight 10/19/20 201 lbs 12.8 oz Constitutional: NAD General: WNWD male EYES: anicteric sclera, lids intact, no discharge  ENMT: intact hearing, oral mucous membranes moist, dentition intact CV: S1S2, RRR, no LE edema Pulmonary: CTAB, no increased work of breathing, no cough, room air Abdomen: normo-active BS + 4 quadrants, soft and non tender, no ascites GU: deferred MSK: no sarcopenia, moves all extremities but has trouble lifting his right leg without support, ambulatory with rolling walker Skin: warm and dry, no rashes or wounds on visible skin Neuro:  no generalized weakness, no cognitive impairment Psych: non-anxious, blunted affect, A and O x 3 Hem/lymph/immuno: no widespread bruising  CURRENT PROBLEM LIST:  Patient Active Problem List  Diagnosis Date Noted   Acute blood loss anemia 08/30/2021   Right leg weakness 08/30/2021   Acute deep vein thrombosis (DVT) of popliteal vein of right lower extremity ([facility]) 08/30/2021   Rash 08/30/2021   Hematoma of iliopsoas muscle, initial encounter 08/28/2021   Shock circulatory ([facility]) 08/28/2021   Port-A-Cath in place 08/25/2021   Acute pulmonary embolus ([facility]) 08/25/2021   Gastric cancer ([facility]) 07/21/2021   Liver masses 07/07/2021   Multiple lesions of metastatic malignancy ([facility]) 07/07/2021   Anxiety 07/07/2021   Vitamin D deficiency 08/03/2020   Hyperglycemia 07/25/2019   Bowel habit changes 08/13/2017   Preventative health care 07/20/2017   Medicare annual wellness visit, subsequent 07/11/2016   Cough 04/18/2016   Stye 03/13/2016   Elevated BP 03/13/2016    Plantar fasciitis of right foot 02/16/2016   Chronotropic incompetence 11/12/2015   DOE (dyspnea on exertion) 11/12/2015   Coronary artery disease involving native coronary artery of native heart without angina pectoris 09/22/2015   S/P drug eluting coronary stent placement 09/22/2015   Erectile dysfunction 07/27/2015   Former smoker 05/21/2014   Gout 07/24/2012   Abnormal finding on EKG 11/21/2010   Mixed hyperlipidemia 04/20/2008   History of skin cancer 04/20/2008   POLYP, COLON 12/23/2007   Internal hemorrhoids 12/23/2007   PAST MEDICAL HISTORY:  Active Ambulatory Problems    Diagnosis Date Noted   POLYP, COLON 12/23/2007   Mixed hyperlipidemia 04/20/2008   Internal hemorrhoids 12/23/2007   History of skin cancer 04/20/2008   Abnormal finding on EKG 11/21/2010   Gout 07/24/2012   Former smoker 05/21/2014   Erectile dysfunction 07/27/2015   Coronary artery disease involving native coronary artery of native heart without angina pectoris 09/22/2015   S/P drug eluting coronary stent placement 09/22/2015   Chronotropic incompetence 11/12/2015   DOE (dyspnea on exertion) 11/12/2015   Plantar fasciitis of right foot 02/16/2016   Stye 03/13/2016   Elevated BP 03/13/2016   Cough 04/18/2016   Medicare annual wellness visit, subsequent 07/11/2016   Preventative health care 07/20/2017   Bowel habit changes 08/13/2017   Hyperglycemia 07/25/2019   Vitamin D deficiency 08/03/2020   Liver masses 07/07/2021   Multiple lesions of metastatic malignancy ([facility]) 07/07/2021   Anxiety 07/07/2021   Gastric cancer ([facility]) 07/21/2021   Port-A-Cath in place 08/25/2021   Acute pulmonary embolus ([facility]) 08/25/2021   Hematoma of iliopsoas muscle, initial encounter 08/28/2021   Shock circulatory ([facility]) 08/28/2021   Acute blood loss anemia 08/30/2021   Right leg weakness 08/30/2021   Acute deep vein thrombosis (DVT) of popliteal vein of right lower extremity ([facility]) 08/30/2021   Rash 08/30/2021    Resolved Ambulatory Problems    Diagnosis Date Noted   Hereditary and idiopathic peripheral neuropathy 10/20/2008   PLANTAR FASCIITIS, BILATERAL 12/23/2007   HAND PAIN, RIGHT 11/24/2008   Edema 11/24/2008   GROIN PAIN 04/20/2008   RIB FRACTURE 12/23/2007   FRACTURE, FINGER 12/23/2007   PROSTATITIS, HX OF 12/23/2007   POLYPECTOMY, HX OF 12/23/2007   TONSILLECTOMY, HX OF 12/23/2007   Routine health maintenance 07/28/2012   Pulmonary embolus, right ([facility]) 08/26/2021   Neutropenia with fever ([facility]) 08/26/2021   Past Medical History:  Diagnosis Date   Basal cell carcinoma 05/29/2012   Basal cell carcinoma 10/23/2012   Basal cell carcinoma 12/12/2012   Basal cell carcinoma 05/26/2015   Basal cell carcinoma 05/26/2015   Basal cell carcinoma 06/08/2016   Basal cell carcinoma 09/12/2016   Basal cell carcinoma 01/29/2019   Finger fracture  Hx of colonoscopy    Hyperlipidemia    Hypertension    Internal hemorrhoids without mention of complication    Myocardial infarction [facility])    Prostatitis    Rib fracture    Squamous cell carcinoma of skin 07/13/2009   Squamous cell carcinoma of skin 02/17/2014   Squamous cell carcinoma of skin 01/24/2018   Squamous cell carcinoma of skin 01/24/2018   Squamous cell carcinoma of skin    SOCIAL HX:  Social History   Tobacco Use   Smoking status: Former    Packs/day: 1.00    Years: 20.00    Pack years: 20.00    Types: Cigarettes    Quit date: 08/07/1978    Years since quitting: 43.1   Smokeless tobacco: Never  Substance Use Topics   Alcohol use: Yes    Alcohol/week: 25.0 standard drinks    Types: 10 Cans of beer, 15 Standard drinks or equivalent per week   FAMILY HX:  Family History  Problem Relation Age of Onset   Heart disease Mother        ? CAD. died 30   Stroke Father        at age 57, smoked cigar   Diabetes Neg Hx    Prostate cancer Neg Hx    Colon cancer Neg Hx    Stomach cancer Neg Hx    Rectal cancer Neg Hx     Colon polyps Neg Hx    Esophageal cancer Neg Hx        Preferred Pharmacy: ALLERGIES:  Allergies  Allergen Reactions   Ofloxacin     REACTION: rash     PERTINENT MEDICATIONS:  Outpatient Encounter Medications as of 09/12/2021  Medication Sig   ALPRAZolam (XANAX) 0.5 MG tablet Take 1 tablet (0.5 mg total) by mouth 2 (two) times daily as needed for anxiety.   atorvastatin (LIPITOR) 80 MG tablet Take 1 tablet (80 mg total) by mouth daily at 6 PM.   capecitabine (XELODA) 500 MG tablet Take 3 tablets every 12 hours, for 14 days then off for 7 days. Take after meal.   Cholecalciferol (VITAMIN D3 PO) Take 1 tablet by mouth daily.   lidocaine-prilocaine (EMLA) cream Apply 1 application topically as needed. Apply 0.33gm to PortaCath site 30 to 60 mins prior to PortaCath access.   lisinopril (ZESTRIL) 2.5 MG tablet Take 1 tablet (2.5 mg total) by mouth daily.   mirtazapine (REMERON) 7.5 MG tablet Take 1 tablet (7.5 mg total) by mouth at bedtime.   morphine (MS CONTIN) 15 MG 12 hr tablet Take 1 tablet (15 mg total) by mouth every 12 (twelve) hours.   nitroGLYCERIN (NITROSTAT) 0.4 MG SL tablet Place 1 tablet (0.4 mg total) under the tongue every 5 (five) minutes as needed for chest pain. Max 3 doses.   Omega-3 Fatty Acids (FISH OIL) 500 MG CAPS Take 500 mg by mouth daily.   ondansetron (ZOFRAN) 8 MG tablet Take 1 tablet (8 mg total) by mouth every 8 (eight) hours as needed for nausea or vomiting.   oxyCODONE (OXY IR/ROXICODONE) 5 MG immediate release tablet Take 1 tablet (5 mg total) by mouth every 4 (four) hours as needed for severe pain.   prochlorperazine (COMPAZINE) 10 MG tablet Take 1 tablet (10 mg total) by mouth every 6 (six) hours as needed for nausea or vomiting.   No facility-administered encounter medications on file as of 09/12/2021.      ---------------------------------------------------------------------------------------------------------------------------------------------------------------------------------------------------------------- Advance Care Planning/Goals of Care: Goals include to maximize  quality of life and symptom management. Patient/health care surrogate gave his/her permission to discuss.Our advance care planning conversation included a discussion about:    The value and importance of advance care planning-has living will on file, wants to be in his own home Experiences with loved ones who have been seriously ill or have died  Exploration of personal, cultural or spiritual beliefs that might influence medical decisions-has belief that when he dies he will meet Jesus, really wants to pass away in his sleep when it is his time Exploration of goals of care in the event of a sudden injury or illness  Identification of a healthcare agent -wife Romie Minus is [facility] POA Review and updating or creation of an advance directive document -MOST. Decision not to resuscitate or to de-escalate disease focused treatments due to poor prognosis.  CODE STATUS: MOST as of 09/12/21: DNR/DNI Comfort measures Antibiotics on case by case basis No IV fluids or feeding tubes  I spent from 12:52-1:23 pm, 31 minutes providing this consultation providing Palliative Care counseling on goals of care. More than 50% of the time in this consultation was spent in counseling and care coordination.   Thank you for the opportunity to participate in the care of Billy [name].  The palliative care team will continue to follow. Please call our office at [phone] if we can be of additional assistance.   Marijo Conception, FNP-C  COVID-19 PATIENT SCREENING TOOL Asked and negative response unless otherwise noted:  Have you had symptoms of covid, tested positive or been in contact with someone with symptoms/positive test in the past 5-10 days?

## 2021-09-14 ENCOUNTER — Inpatient Hospital Stay: Payer: Medicare HMO

## 2021-09-14 ENCOUNTER — Inpatient Hospital Stay: Payer: Medicare HMO | Admitting: Nurse Practitioner

## 2021-09-15 ENCOUNTER — Telehealth: Payer: Medicare HMO | Admitting: Nurse Practitioner

## 2021-09-16 ENCOUNTER — Inpatient Hospital Stay: Payer: Medicare HMO | Admitting: Hematology

## 2021-09-16 ENCOUNTER — Inpatient Hospital Stay: Payer: Medicare HMO | Attending: Physician Assistant

## 2021-09-16 ENCOUNTER — Encounter: Payer: Self-pay | Admitting: Hematology

## 2021-09-16 ENCOUNTER — Other Ambulatory Visit: Payer: Self-pay

## 2021-09-16 VITALS — BP 99/66 | HR 67 | Temp 97.9°F | Resp 18 | Wt 181.0 lb

## 2021-09-16 DIAGNOSIS — G4700 Insomnia, unspecified: Secondary | ICD-10-CM | POA: Diagnosis not present

## 2021-09-16 DIAGNOSIS — K4020 Bilateral inguinal hernia, without obstruction or gangrene, not specified as recurrent: Secondary | ICD-10-CM | POA: Insufficient documentation

## 2021-09-16 DIAGNOSIS — I10 Essential (primary) hypertension: Secondary | ICD-10-CM | POA: Diagnosis not present

## 2021-09-16 DIAGNOSIS — C161 Malignant neoplasm of fundus of stomach: Secondary | ICD-10-CM | POA: Diagnosis not present

## 2021-09-16 DIAGNOSIS — Z9221 Personal history of antineoplastic chemotherapy: Secondary | ICD-10-CM | POA: Insufficient documentation

## 2021-09-16 DIAGNOSIS — E785 Hyperlipidemia, unspecified: Secondary | ICD-10-CM | POA: Diagnosis not present

## 2021-09-16 DIAGNOSIS — Z79899 Other long term (current) drug therapy: Secondary | ICD-10-CM | POA: Insufficient documentation

## 2021-09-16 DIAGNOSIS — R5383 Other fatigue: Secondary | ICD-10-CM | POA: Insufficient documentation

## 2021-09-16 DIAGNOSIS — R918 Other nonspecific abnormal finding of lung field: Secondary | ICD-10-CM | POA: Diagnosis not present

## 2021-09-16 DIAGNOSIS — R202 Paresthesia of skin: Secondary | ICD-10-CM | POA: Insufficient documentation

## 2021-09-16 DIAGNOSIS — Z79818 Long term (current) use of other agents affecting estrogen receptors and estrogen levels: Secondary | ICD-10-CM | POA: Diagnosis not present

## 2021-09-16 DIAGNOSIS — C169 Malignant neoplasm of stomach, unspecified: Secondary | ICD-10-CM | POA: Diagnosis present

## 2021-09-16 DIAGNOSIS — I700 Atherosclerosis of aorta: Secondary | ICD-10-CM | POA: Insufficient documentation

## 2021-09-16 DIAGNOSIS — Z7901 Long term (current) use of anticoagulants: Secondary | ICD-10-CM | POA: Diagnosis not present

## 2021-09-16 DIAGNOSIS — F419 Anxiety disorder, unspecified: Secondary | ICD-10-CM | POA: Insufficient documentation

## 2021-09-16 DIAGNOSIS — Z85828 Personal history of other malignant neoplasm of skin: Secondary | ICD-10-CM | POA: Diagnosis not present

## 2021-09-16 DIAGNOSIS — R69 Illness, unspecified: Secondary | ICD-10-CM | POA: Diagnosis not present

## 2021-09-16 DIAGNOSIS — I7121 Aneurysm of the ascending aorta, without rupture: Secondary | ICD-10-CM | POA: Insufficient documentation

## 2021-09-16 DIAGNOSIS — I714 Abdominal aortic aneurysm, without rupture, unspecified: Secondary | ICD-10-CM | POA: Insufficient documentation

## 2021-09-16 DIAGNOSIS — C787 Secondary malignant neoplasm of liver and intrahepatic bile duct: Secondary | ICD-10-CM | POA: Diagnosis not present

## 2021-09-16 DIAGNOSIS — Z95828 Presence of other vascular implants and grafts: Secondary | ICD-10-CM

## 2021-09-16 DIAGNOSIS — Z79811 Long term (current) use of aromatase inhibitors: Secondary | ICD-10-CM | POA: Insufficient documentation

## 2021-09-16 DIAGNOSIS — I252 Old myocardial infarction: Secondary | ICD-10-CM | POA: Insufficient documentation

## 2021-09-16 LAB — COMPREHENSIVE METABOLIC PANEL
ALT: 76 U/L — ABNORMAL HIGH (ref 0–44)
AST: 143 U/L — ABNORMAL HIGH (ref 15–41)
Albumin: 3.2 g/dL — ABNORMAL LOW (ref 3.5–5.0)
Alkaline Phosphatase: 396 U/L — ABNORMAL HIGH (ref 38–126)
Anion gap: 6 (ref 5–15)
BUN: 17 mg/dL (ref 8–23)
CO2: 25 mmol/L (ref 22–32)
Calcium: 8.7 mg/dL — ABNORMAL LOW (ref 8.9–10.3)
Chloride: 103 mmol/L (ref 98–111)
Creatinine, Ser: 0.83 mg/dL (ref 0.61–1.24)
GFR, Estimated: >60 mL/min (ref >60)
Glucose, Bld: 127 mg/dL — ABNORMAL HIGH (ref 70–99)
Potassium: 4.2 mmol/L (ref 3.5–5.1)
Sodium: 134 mmol/L — ABNORMAL LOW (ref 135–145)
Total Bilirubin: 0.8 mg/dL (ref 0.3–1.2)
Total Protein: 6.4 g/dL — ABNORMAL LOW (ref 6.5–8.1)

## 2021-09-16 LAB — CBC WITH DIFFERENTIAL/PLATELET
Abs Immature Granulocytes: 0.03 10*3/uL (ref 0.00–0.07)
Basophils Absolute: 0.1 10*3/uL (ref 0.0–0.1)
Basophils Relative: 1 %
Eosinophils Absolute: 0.1 10*3/uL (ref 0.0–0.5)
Eosinophils Relative: 1 %
HCT: 32.7 % — ABNORMAL LOW (ref 39.0–52.0)
Hemoglobin: 10.3 g/dL — ABNORMAL LOW (ref 13.0–17.0)
Immature Granulocytes: 0 %
Lymphocytes Relative: 20 %
Lymphs Abs: 1.8 10*3/uL (ref 0.7–4.0)
MCH: 28.9 pg (ref 26.0–34.0)
MCHC: 31.5 g/dL (ref 30.0–36.0)
MCV: 91.9 fL (ref 80.0–100.0)
Monocytes Absolute: 1.2 10*3/uL — ABNORMAL HIGH (ref 0.1–1.0)
Monocytes Relative: 14 %
Neutro Abs: 5.7 10*3/uL (ref 1.7–7.7)
Neutrophils Relative %: 64 %
Platelets: 242 10*3/uL (ref 150–400)
RBC: 3.56 MIL/uL — ABNORMAL LOW (ref 4.22–5.81)
RDW: 14.8 % (ref 11.5–15.5)
WBC: 8.9 10*3/uL (ref 4.0–10.5)
nRBC: 0 % (ref 0.0–0.2)

## 2021-09-16 MED ORDER — SODIUM CHLORIDE 0.9% FLUSH
10.0000 mL | Freq: Once | INTRAVENOUS | Status: AC
  Administered 2021-09-16: 10 mL

## 2021-09-16 MED ORDER — RIVAROXABAN 10 MG PO TABS: 10.0000 mg | ORAL_TABLET | Freq: Every day | ORAL | 0 refills | Status: DC

## 2021-09-16 MED ORDER — HEPARIN SOD (PORK) LOCK FLUSH 100 UNIT/ML IV SOLN
500.0000 [IU] | Freq: Once | INTRAVENOUS | Status: AC
  Administered 2021-09-16: 500 [IU]

## 2021-09-16 NOTE — Progress Notes (Addendum)
[place]   [phone]) [phone] [phone]   Clinic Follow up Note   Patient Care Team: Biagio Borg, MD as PCP - General (Internal Medicine) Debara Pickett Nadean Corwin, MD as PCP - Cardiology (Cardiology) Lavonna Monarch, MD (Dermatology) Warren Danes, PA-C as Physician Assistant (Dermatology) Truitt Merle, MD as Consulting Physician (Oncology) Royston Bake, RN as Oncology Nurse Navigator (Oncology)  Date of Service:  09/16/2021  CHIEF COMPLAINT: f/u of gastric cancer  CURRENT THERAPY:  Pending  ASSESSMENT & PLAN:  Billy [name] is a 81 y.o. male with   1. Massive PE and RLE DVT -following chemo cycle 2, he developed shortness of breath and fever. He went to ED and was admitted. CTA chest showed right subsegmental PE, and troponins were trending upward suggesting at least submassive PE. -he started heparin on admission, but this was stopped after he developed a pelvic hematoma and bleeding. Now s/p IVC filter placement. -due to his persistent right lower extremity pain and weakness, may be partially related to the DVT, and highest high risk for recurrent thrombosis, I recommend him to start a low-dose Xarelto 10 mg daily, and may increase dose if he tolerates well.  2. Metastatic gastric adenocarcinoma to liver, nodes, and possible lungs, cTxNxM1, stage IV, MMR normal, HER2 (+) -presented with epigastric pain. CT CAP on 07/01/21 showed multiple hepatic lesions measuring up to 5.9 cm and pathologically enlarged abdominal lymph nodes, and multiple small lung nodules up to 60m. -liver biopsy on 07/12/21 showed metastatic adenocarcinoma, most consistent with primary colorectal or up GI -upper endoscopy on 07/19/21 by Dr. SFuller Planshowed a 4 cm gastric fundus mass. Pathology confirmed invasive moderately to poorly differentiated adenocarcinoma. Additional testing showed Her2+ and MMR normal. -He began FOLFOX 08/11/21, tolerated first week well with mild fatigue.   -given his Her2 positivity, he started trastuzumab and pembrolizumab on 08/18/21. Baseline echo with normal EF 60-65%. -To synchronize his treatment, the plan is to change to q3 week Capox with cycle 3 chemo, and continue trastuzumab and pembrolizumab every 3 weeks.  We again reviewed in detail today, patient agrees. -The patient has a palpable 1-2 cm left cervical/supraclavicular node to follow on treatment, slightly softer today -new baseline CT CAP on 08/24/21 showed: progression of mediastinal and right hilar lymphadenopathy, innumerable hepatic metastases, and necrotic lymphadenopathy in hepatoduodenal ligament and aortocaval space; similar appearance of bilateral pulmonary nodules; bilateral groin hernias; 4.5 cm thoracic AAA. -hold treatment due to #1 -We had a long conversation about restart chemo treatment (deescalated to Xeloda, Herceptin, and Keytruda) or not.  Patient is very reluctant due to the previous complications after chemo.  He will think about it. -We also discussed the option of supportive care alone and hospice, how it works etc.    3. Abdominal Pain -Since starting MS Contin twice daily (08/12/21) his pain has essentially resolved, not required hydrocodone or oxycodone since then -he denies pain today.   4. Goal of care discussion, partial DNR, Social Support -He understands the incurable nature of his cancer, and the overall poor prognosis, especially if he does not have good response to chemotherapy or progress on chemo -The patient understands the goal of care is palliative. -they have two children that live here in town. -he has a living well. He does not want to be on life support if he is terminal.  -Currently on full code, he is willing to change DNR when his cancer progress further and he has no or limited  treatment options    5. Anxiety and insomnia  -he has xanax as needed  -Mirtazapine has been helpful, refilled  6. RLE weakness and pain -secondary to right  iliacs muscular hematoma and right lower extremity DVT -Continue pain management, I encouraged him to exercise    PLAN: -I called in Xarelto 10 mg daily, he was started  -lab, flush, and f/u in 3 weeks   No problem-specific Assessment & Plan notes found for this encounter.   SUMMARY OF ONCOLOGIC HISTORY: Oncology History Overview Note   Cancer Staging  Gastric cancer [facility]) Staging form: Stomach, AJCC 8th Edition - Clinical stage from 07/19/2021: Stage IVB (cTX, cN2, pM1) - Signed by Truitt Merle, MD on 09/16/2021    Gastric cancer ([place])  07/01/2021 Imaging   EXAM: CT ANGIOGRAPHY CHEST WITH CONTRAST  IMPRESSION: 1. No evidence of a pulmonary embolism. 2. Findings consistent with neoplastic disease. Multiple liver masses consistent with widespread hepatic metastatic disease. Ill-defined partly imaged mass along the porta hepatis suspicious for a pancreatic malignancy. There also multiple subcentimeter lung nodules consistent with metastatic disease. Recommend follow-up CT of the abdomen and pelvis with contrast for further assessment 3. No acute findings in the chest. 4. Dilated ascending thoracic aorta to 4.7 cm. Ascending thoracic aortic aneurysm. Recommend semi-annual imaging followup by CTA or MRA and referral to cardiothoracic surgery if not already obtained. This recommendation follows 2010 ACCF/AHA/AATS/ACR/ASA/SCA/SCAI/SIR/STS/SVM Guidelines for the Diagnosis and Management of Patients With Thoracic Aortic Disease. Circulation. 2010; 121: V784-O962. Aortic aneurysm NOS (ICD10-I71.9)     07/01/2021 Imaging   EXAM: CT ABDOMEN AND PELVIS WITH CONTRAST  IMPRESSION: 1. Numerous hypodense hepatic lesions with dominant lesion in the inferior right lobe measuring up to 5.9 cm. Findings worrisome for diffuse metastatic disease. Primary hepatic neoplasm not excluded in the inferior right lobe. 2. Pathologically enlarged periportal, portal caval, peripancreatic and aortocaval  lymph nodes worrisome for metastatic disease. 3. Bilateral inguinal hernias. Left inguinal hernia contains colon. No bowel obstruction. 4.  Aortic Atherosclerosis (ICD10-I70.0).   07/12/2021 Pathology Results   FINAL MICROSCOPIC DIAGNOSIS:   A. LEFT LIVER MASS, NEEDLE CORE BIOPSY:  Metastatic adenocarcinoma.  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   Comment: The morphologic features of the neoplasm are most consistent with metastasis from a primary colorectal adenocarcinoma.  However, possibility of primary in the upper GI tract and pancreaticobiliary tree cannot be excluded.  Markers for lung adenocarcinoma are negative.   ADDENDUM:  HER2 by immunohistochemistry is POSITIVE (3+).  ADDENDUM:  Mismatch Repair Protein (IHC)  SUMMARY INTERPRETATION: NORMAL   07/19/2021 Procedure   Upper Endoscopy, Dr. Fuller Plan  Impression: - Normal esophagus. - Malignant gastric tumor in the gastric fundus. Biopsied. - Normal duodenal bulb and second portion of the duodenum.   07/19/2021 Pathology Results   Diagnosis Stomach, biopsy, Mass - INVASIVE MODERATE TO POORLY DIFFERENTIATED ADENOCARCINOMA,   07/19/2021 Cancer Staging   Staging form: Stomach, AJCC 8th Edition - Clinical stage from 07/19/2021: Stage IVB (cTX, cN2, pM1) - Signed by Truitt Merle, MD on 09/16/2021 Stage prefix: Initial diagnosis    07/21/2021 Initial Diagnosis   Gastric cancer ([place])   08/11/2021 -  Chemotherapy   Patient is on Treatment Plan : GASTROESOPHAGEAL FOLFOX q14d x 6 cycles     08/18/2021 -  Chemotherapy   Patient is on Treatment Plan : Gastric - Herceptin & Keytruda Q 21 days     08/24/2021 Imaging   New baseline CT CAP IMPRESSION: 1. Mild interval progression of mediastinal  and right hilar lymphadenopathy. 2. Similar appearance of numerous bilateral pulmonary nodules compatible with metastatic disease. 3. Interval progression of innumerable hepatic metastases. 4. Similar to mildly progressive  necrotic lymphadenopathy in the hepatoduodenal ligament and aortocaval space. 5. Small inferior splenic infarct, new since prior. 6. Bilateral groin hernias containing small bowel and colon, respectively, without complicating features. 7. 4.5 cm ascending thoracic aortic aneurysm. Recommend semi-annual imaging followup by CTA or MRA and referral to cardiothoracic surgery if not already obtained.    ADDENDUM: Upper normal 10 mm short axis left thoracic inlet node on 04/02. 11 mm short axis left supraclavicular node on image 3/2 is upper normal to mildly enlarged.      INTERVAL HISTORY:  Salomon Ganser Kring is here for a follow up of gastric cancer. He was last seen by me on 09/02/21 while he was in the hospital. He presents to the clinic accompanied by his wife. He reports he is able to eat better lately. He reports he can move his right foot from side to side and back and forth on the ground, but he cannot lift his foot. He reports continued tingling down his leg. He denies any pain today.   All other systems were reviewed with the patient and are negative.  MEDICAL HISTORY:  Past Medical History:  Diagnosis Date   Basal cell carcinoma 05/29/2012   left thigh-(EXC)   Basal cell carcinoma 10/23/2012   right side of nose (MOHS)   Basal cell carcinoma 12/12/2012   nod-irght cheek (MOHS)   Basal cell carcinoma 05/26/2015   nod-left calf (CX35FU)   Basal cell carcinoma 05/26/2015   nod-left nose (txpbx)   Basal cell carcinoma 06/08/2016   nod-above right brow (CX35FU)   Basal cell carcinoma 09/12/2016   left nose (MOHS)   Basal cell carcinoma 01/29/2019   nod-right tip of nose (MOHS)   Benign neoplasm of colon    Finger fracture    Hx of colonoscopy    Hyperlipidemia    Hypertension    Internal hemorrhoids without mention of complication    Myocardial infarction ([place])    09/01/2015   Plantar fascial fibromatosis    Prostatitis    Rib fracture    Squamous cell carcinoma of skin  07/13/2009   RIght lower outer leg   Squamous cell carcinoma of skin 02/17/2014   in situ-right temple (txpbx)   Squamous cell carcinoma of skin 01/24/2018   in situ-right chest (txpbx)   Squamous cell carcinoma of skin 01/24/2018   in situ0 right forehead (txpbx)   Squamous cell carcinoma of skin     SURGICAL HISTORY: Past Surgical History:  Procedure Laterality Date   cardiac stint  2017   COLONOSCOPY  2016   IR IMAGING GUIDED PORT INSERTION  07/12/2021   IR IVC FILTER PLMT / S&I /IMG GUID/MOD SED  08/29/2021   IR US GUIDE BX ASP/DRAIN  07/12/2021   POLYPECTOMY     TONSILLECTOMY     VARICOCELECTOMY     Left    I have reviewed the social history and family history with the patient and they are unchanged from previous note.  ALLERGIES:  is allergic to ofloxacin.  MEDICATIONS:  Current Outpatient Medications  Medication Sig Dispense Refill   rivaroxaban (XARELTO) 10 MG TABS tablet Take 1 tablet (10 mg total) by mouth daily. 30 tablet 0   ALPRAZolam (XANAX) 0.5 MG tablet Take 1 tablet (0.5 mg total) by mouth 2 (two) times daily as needed for anxiety. [place]  tablet 0   atorvastatin (LIPITOR) 80 MG tablet Take 1 tablet (80 mg total) by mouth daily at 6 PM. 90 tablet 3   capecitabine (XELODA) 500 MG tablet Take 3 tablets every 12 hours, for 14 days then off for 7 days. Take after meal. 84 tablet 0   Cholecalciferol (VITAMIN D3 PO) Take 1 tablet by mouth daily.     lidocaine-prilocaine (EMLA) cream Apply 1 application topically as needed. Apply 0.33gm to PortaCath site 30 to 60 mins prior to PortaCath access. 30 g 2   lisinopril (ZESTRIL) 2.5 MG tablet Take 1 tablet (2.5 mg total) by mouth daily. 90 tablet 3   mirtazapine (REMERON) 7.5 MG tablet Take 1 tablet (7.5 mg total) by mouth at bedtime. 30 tablet 1   morphine (MS CONTIN) 15 MG 12 hr tablet Take 1 tablet (15 mg total) by mouth every 12 (twelve) hours. 60 tablet 0   nitroGLYCERIN (NITROSTAT) 0.4 MG SL tablet Place 1 tablet (0.4 mg  total) under the tongue every 5 (five) minutes as needed for chest pain. Max 3 doses. 25 tablet 3   Omega-3 Fatty Acids (FISH OIL) 500 MG CAPS Take 500 mg by mouth daily.     ondansetron (ZOFRAN) 8 MG tablet Take 1 tablet (8 mg total) by mouth every 8 (eight) hours as needed for nausea or vomiting. 30 tablet 3   oxyCODONE (OXY IR/ROXICODONE) 5 MG immediate release tablet Take 1 tablet (5 mg total) by mouth every 4 (four) hours as needed for severe pain. 30 tablet 0   prochlorperazine (COMPAZINE) 10 MG tablet Take 1 tablet (10 mg total) by mouth every 6 (six) hours as needed for nausea or vomiting. 30 tablet 3   No current facility-administered medications for this visit.    PHYSICAL EXAMINATION: ECOG PERFORMANCE STATUS: 3 - Symptomatic, >50% confined to bed  Vitals:   09/16/21 1447  BP: 99/66  Pulse: 67  Resp: 18  Temp: 97.9 F (36.6 C)  SpO2: 96%   Wt Readings from Last 3 Encounters:  09/16/21 181 lb (82.1 kg)  08/25/21 184 lb 4.8 oz (83.6 kg)  08/25/21 184 lb 4.8 oz (83.6 kg)     GENERAL:alert, no distress and comfortable SKIN: skin color normal, no rashes or significant lesions EYES: normal, Conjunctiva are pink and non-injected, sclera clear  NEURO: alert & oriented x 3 with fluent speech  LABORATORY DATA:  I have reviewed the data as listed CBC Latest Ref Rng & Units 09/16/2021 09/03/2021 09/02/2021  WBC 4.0 - 10.5 K/uL 8.9 14.2(H) 14.8(H)  Hemoglobin 13.0 - 17.0 g/dL 10.3(L) 8.8(L) 7.6(L)  Hematocrit 39.0 - 52.0 % 32.7(L) 27.4(L) 23.6(L)  Platelets 150 - 400 K/uL 242 198 210     CMP Latest Ref Rng & Units 09/16/2021 08/31/2021 08/31/2021  Glucose 70 - 99 mg/dL 127(H) 126(H) 90  BUN 8 - 23 mg/dL 17 34(H) 32(H)  Creatinine 0.61 - 1.24 mg/dL 0.83 0.80 0.67  Sodium 135 - 145 mmol/L 134(L) 134(L) 136  Potassium 3.5 - 5.1 mmol/L 4.2 4.6 4.1  Chloride 98 - 111 mmol/L 103 102 105  CO2 22 - 32 mmol/L _0 Calcium 8.9 - 10.3 mg/dL 8.7(L) 8.2(L) 8.0(L)  Total Protein 6.5  - 8.1 g/dL 6.4(L) - -  Total Bilirubin 0.3 - 1.2 mg/dL 0.8 - -  Alkaline Phos 38 - 126 U/L 396(H) - -  AST 15 - 41 U/L 143(H) - -  ALT 0 - 44 U/L 76(H) - -  RADIOGRAPHIC STUDIES: I have personally reviewed the radiological images as listed and agreed with the findings in the report. No results found.    Orders Placed This Encounter  Procedures   Ferritin    Standing Status:   Future    Standing Expiration Date:   09/16/2022   Iron and TIBC    Standing Status:   Future    Standing Expiration Date:   09/16/2022   All questions were answered. The patient knows to call the clinic with any problems, questions or concerns. No barriers to learning was detected. The total time spent in the appointment was 40 minutes.     Truitt Merle, MD 09/16/2021   I, Wilburn Mylar, am acting as scribe for Truitt Merle, MD.   I have reviewed the above documentation for accuracy and completeness, and I agree with the above.

## 2021-09-17 ENCOUNTER — Encounter: Payer: Self-pay | Admitting: Hematology

## 2021-09-19 ENCOUNTER — Telehealth: Payer: Self-pay

## 2021-09-19 NOTE — Telephone Encounter (Signed)
At request of Palliative NP, phone call returned to patient. VM left.

## 2021-09-20 ENCOUNTER — Telehealth: Payer: Self-pay

## 2021-09-20 NOTE — Telephone Encounter (Signed)
(  1:48 pm) PC SW telephoned patient to advise him that the counselor through the cancer center will follow-up with him regarding for counseling. Patient stated verbalized appreciation for the update and will await call from patient.

## 2021-09-22 ENCOUNTER — Telehealth: Payer: Self-pay

## 2021-09-22 ENCOUNTER — Encounter: Payer: Self-pay | Admitting: Nurse Practitioner

## 2021-09-22 DIAGNOSIS — Z515 Encounter for palliative care: Secondary | ICD-10-CM

## 2021-09-22 NOTE — Telephone Encounter (Signed)
(  11:50 am) PC SW returned call to patient's wife, who advised that they had not heard back from the counselor at the cancer center who called earlier this week. SW provided the wife counselor's name and contact information.  No other concerns noted.

## 2021-09-26 ENCOUNTER — Other Ambulatory Visit: Payer: Self-pay

## 2021-09-26 MED ORDER — SERTRALINE HCL 25 MG PO TABS: 25.0000 mg | ORAL_TABLET | Freq: Every day | ORAL | 0 refills | Status: DC

## 2021-09-26 NOTE — Progress Notes (Signed)
Verbal order given by Dr. Burr Medico for Zoloft 25mg  PO Daily for 30 days no refill.

## 2021-09-28 ENCOUNTER — Other Ambulatory Visit: Payer: Self-pay | Admitting: Hematology

## 2021-09-28 ENCOUNTER — Telehealth: Payer: Self-pay

## 2021-09-28 ENCOUNTER — Encounter: Payer: Self-pay | Admitting: Licensed Clinical Social Worker

## 2021-09-28 MED ORDER — MORPHINE SULFATE ER 15 MG PO TBCR: 15.0000 mg | EXTENDED_RELEASE_TABLET | Freq: Two times a day (BID) | ORAL | 0 refills | Status: DC

## 2021-09-28 NOTE — Telephone Encounter (Signed)
Spoke with pt about regarding pain medicine.  Pt confirmed that he's taking MS Contin daily Q12hrs as prescribed.  Pt stated he took an Oxycodone IR 5mg  the other day.  Pt stated he does not feel that the Oxycodone helped his pain.  Pt denied taking another Oxycodone after 4hrs but did state he was still in 8/10 pain at that time.  Explained to pt that the Oxycodone is for break through pain and that he should have taking another Oxycodone at that time if his pain had not gotten to a tolerable level.  Instructed pt if his pain is 8 or higher to take 2 pills of Oxycodone to help get pain under control.  In 4 hrs after taking Oxycodone, if pain is 7 or less, take 1 pill of Oxycodone until pain level is at a tolerable level.  Pt verbalized understanding and recited instructions.  Pt stated he does not need a refill on the oxycodone but does need a refill on MS Contin.  Notified Dr. Burr Medico of pt's request for refill on MS Contin.  Pt had no further questions or concerns at this time.

## 2021-09-28 NOTE — Progress Notes (Signed)
[place] [facility]  Initial Assessment   Billy [name] is a 81 y.o. year old male contacted by phone. Clinical Social Work was referred by medical provider for assessment of psychosocial needs.   SDOH (Social Determinants of Health) assessments performed: Yes   Distress Screen completed: No No flowsheet data found.    Family/Social Information:  Housing Arrangement: patient lives with his spouse, the couple has a daughter who resides two miles away and checks in daily as well as a son who resides about 10 miles away . Family members/support persons in your life? Family Transportation concerns: no  Employment: Retired. Income source: Paediatric nurse concerns: No Type of concern: None Food access concerns: no Religious or spiritual practice: yes Services Currently in place:  palliative home care  Coping/ Adjustment to diagnosis: Patient understands treatment plan and what happens next? Pt states he anticipates discussing further treatment options at his next appointment and is uncertain what that may be as he has not tolerated treatment well so far. Concerns about diagnosis and/or treatment: Feelings of anger or sadness Patient reported stressors: Depression Hopes and priorities: Pt states his priority is to remain at home as he has experienced multiple hospitalizations since diagnosis and does not wish to be admitted.  Pt states he hopes his ambulation and overall feeling of well being improves; however, at present he is not optimistic. Patient enjoys  Pt states prior to diagnosis he was extremely active and had never had any major medical concerns Current coping skills/ strengths: Supportive family/friends     SUMMARY: Current SDOH Barriers:  Mental Health Concerns   Clinical Social Work Clinical Goal(s):  patient will work with the medical team to address needs related to [name] and quality of life and will work with CSW to process emotional components  of diagnosis and rapid progression of disease  Interventions: Discussed common feeling and emotions when being diagnosed with cancer, and the importance of support during treatment Informed patient of the support team roles and support services at [facility] Provided [place] contact information and encouraged patient to call with any questions or concerns CSW to meet w/ pt/spouse following pt's medical appointment on 3/3 to assess for supportive needs.   Follow Up Plan: CSW will see patient on 10/07/2021 Patient verbalizes understanding of plan: Yes    Henriette Combs, LCSW

## 2021-09-29 ENCOUNTER — Ambulatory Visit: Payer: Medicare HMO | Admitting: Hematology

## 2021-09-29 ENCOUNTER — Ambulatory Visit: Payer: Medicare HMO

## 2021-09-29 ENCOUNTER — Other Ambulatory Visit: Payer: Medicare HMO

## 2021-09-30 ENCOUNTER — Other Ambulatory Visit: Payer: Self-pay

## 2021-09-30 ENCOUNTER — Encounter: Payer: Self-pay | Admitting: Family Medicine

## 2021-09-30 ENCOUNTER — Other Ambulatory Visit: Payer: Medicare HMO | Admitting: Family Medicine

## 2021-09-30 VITALS — Temp 97.2°F | Resp 20

## 2021-09-30 DIAGNOSIS — R69 Illness, unspecified: Secondary | ICD-10-CM | POA: Diagnosis not present

## 2021-09-30 DIAGNOSIS — R29898 Other symptoms and signs involving the musculoskeletal system: Secondary | ICD-10-CM | POA: Diagnosis not present

## 2021-09-30 DIAGNOSIS — Z515 Encounter for palliative care: Secondary | ICD-10-CM | POA: Diagnosis not present

## 2021-09-30 DIAGNOSIS — G893 Neoplasm related pain (acute) (chronic): Secondary | ICD-10-CM

## 2021-09-30 DIAGNOSIS — I959 Hypotension, unspecified: Secondary | ICD-10-CM | POA: Diagnosis not present

## 2021-09-30 DIAGNOSIS — F419 Anxiety disorder, unspecified: Secondary | ICD-10-CM

## 2021-09-30 NOTE — Progress Notes (Signed)
[facility] Palliative Care Consult Note Telephone: [phone]  Fax: [phone]    Date of encounter: 09/30/21 12:00 PM PATIENT NAME: Billy [name] [address] [place] [id number]   [phone] (home)  DOB: 81-Jan-1942 MRN: [id number] PRIMARY CARE PROVIDER:    Biagio Borg, MD,  Billy [name] [address] [phone]  REFERRING PROVIDER:   Biagio Borg, MD [address] [place],  [place] [address] [phone]  RESPONSIBLE PARTY:    Contact Information     Name Relation Home Work Mobile   Billy [name] Spouse [phone]  [phone]   Billy [name]   [phone]        I met face to face with patient and wife in their home. Palliative Care was asked to follow this patient by consultation request of  Billy Borg, MD to address advance care planning and complex medical decision making. This is a follow up visit.                                   ASSESSMENT, SYMPTOM MANAGEMENT AND PLAN / RECOMMENDATIONS:  Palliative Care Encounter Had SW referral to address coping. Continue to educate on normal disease process Follow up code status/goals of care after next Oncologist visit. Encouraged energy conservation techniques and suggested midday rest.  Cancer associated pain Has MS Contin BID and OxyCodone for breakthrough. Has back pain between shoulders-will try Lidoderm 5% TD patch for 12 hours daily to site of worst pain to see if this will improve pain relief.  Anxiety and depression Continues on Remeron 7.5 mg QHS Added Sertraline 25 mg daily Continue to encourage involvement with Oncology SW for counseling and coping  Hypotension unspecified Hypotensive today.  Advised wife to check BP BID and record, hold Lisinopril for SBP < 120. Have nurse follow up by phone in 1 week for BP log. Encourage increased fluid intake.  RLE weakness Continue home PT and HEP on days when PT not  coming.    Advance Care Planning/Goals of Care: Goals include to maximize quality of life and symptom management. Patient gave his permission to discuss. Our advance care planning conversation included a discussion about:    The value and importance of advance care planning-has advance directive Exploration of personal, cultural or spiritual beliefs that might influence medical decisions- had significant adverse events following chemotherapy, has had loss of function of RLE as result, wants to be able to be up and around doing things at home. Exploration of goals of care in the event of a sudden injury or illness-Does not want to be maintained on life support if chemotherapy not an option or cancer progresses Identification of a healthcare agent-wife Review MOST form but will defer completing until after follow up with Oncologist.  CODE STATUS: Currently full code but likely to change based on outcome of next visit with Oncologist.     Follow up Palliative Care Visit: Palliative care will continue to follow for complex medical decision making, advance care planning, and clarification of goals. Return 4 weeks or prn.   This visit was coded based on medical decision making (MDM).  PPS: 50%  HOSPICE ELIGIBILITY/DIAGNOSIS: TBD  Chief Complaint:  Billy [name] continues to follow up with patient for chronic disease management of metastatic gastric cancer, for advance directives and defining/refining goals of care.  HISTORY OF PRESENT ILLNESS:  Billy [name]  is a 81 y.o. year old male  with metastatic gastric cancer with RLE DVT /acute PE and subsequent weakness following hematoma after use of TPA. He has hx of CAD with drug eluting stent.  As long as pt is still his pain is fairly well controlled but when he starts moving and standing up he has significantly increasing back pain between his shoulder blades down the center of his back. Reports Morphine does not last  12 hrs and Dr Annamaria Boots was talking about taking the OxyCodone for breakthrough but he is concerned about taking additional narcotic pain meds with risk for fall given the very slow improvement in his right leg.  He can lift the leg with the other foot and can slide back when knee bent but if he places right foot on the floor he cannot lift independently and continues to work with PT.  He reports eating good breakfast but appetite is poor and by evenings he most times just wants to go to bed.  Started on Zoloft yesterday, has been taking Remeron.  His biggest issue is coping with the loss of function in his RLE which concerns him for fall risk and prevents him from doing any work around the house or in his shop because he cannot stand for long periods and pain increases.  Took Xanax which helped some. He does not understand why he is so fatigued when "doing nothing" and does not see how counseling will benefit him.  Advised pt and wife that his anemia and the highly metabolic processes of the cancer would be fatiguing.  Also BP is somewhat low and likely he drops even more when standing.  Wife states PT has been checking his BP with each visit and had not noted hypotension.  He had not had ACE-I prior to my BP check today. He states he has follow up with Dr Burr Medico on 10/07/21 "but she will have to do some big convincing for me even to do a chemotherapy pill."  History obtained from review of EMR, discussion with primary team, and interview with wife, and Billy [name].  I reviewed available labs, medications, imaging, studies and related documents from the EMR.  Records reviewed and summarized above.   ROS General: NAD EYES: denies vision changes ENMT: denies dysphagia Cardiovascular: denies chest pain, denies DOE Pulmonary: denies cough, denies increased SOB Abdomen: endorses fair appetite, denies constipation, endorses continence of bowel GU: denies dysuria, endorses continence of urine MSK:  endorses continued  RLE weakness, no falls  Skin: denies rashes or wounds Neurological: endorses back pain, denies insomnia Psych: Endorses depressed mood with anxiety particularly in evening Heme/lymph/immuno: denies bruises, abnormal bleeding  Physical Exam: Current and past weights: weight as of 09/16/21 181 lbs,  07/06/21 weight 197 lbs 8 ounces Constitutional: Chronically ill and uncomfortable appearing General: frail appearing, thin EYES: anicteric sclera, lids intact, no discharge  ENMT: intact hearing, oral mucous membranes moist, dentition intact CV: S1S2, RRR, trace RLE edema Pulmonary: CTAB, no increased work of breathing, no cough, room air Abdomen: normo-active BS + 4 quadrants, soft and non tender, no ascites GU: deferred MSK: no sarcopenia, moves all extremities, ambulatory with walker Skin: warm and dry, no rashes or wounds on visible skin Neuro:  noted generalized weakness particularly worse in RLE,  no cognitive impairment Psych: non-anxious, depressed affect, A and O x 3 Hem/lymph/immuno: no widespread bruising   Thank you for the opportunity to participate in the care of Mr. Goshorn.  The palliative care team  will continue to follow. Please call our office at [phone] if we can be of additional assistance.   Marijo Conception, FNP -C  COVID-19 PATIENT SCREENING TOOL Asked and negative response unless otherwise noted:   Have you had symptoms of covid, tested positive or been in contact with someone with symptoms/positive test in the past 5-10 days? No

## 2021-10-07 ENCOUNTER — Inpatient Hospital Stay: Payer: Medicare HMO | Attending: Physician Assistant

## 2021-10-07 ENCOUNTER — Other Ambulatory Visit: Payer: Self-pay

## 2021-10-07 ENCOUNTER — Inpatient Hospital Stay (HOSPITAL_BASED_OUTPATIENT_CLINIC_OR_DEPARTMENT_OTHER): Payer: Medicare HMO | Admitting: Hematology

## 2021-10-07 ENCOUNTER — Encounter: Payer: Self-pay | Admitting: Nurse Practitioner

## 2021-10-07 ENCOUNTER — Inpatient Hospital Stay: Payer: Medicare HMO

## 2021-10-07 ENCOUNTER — Inpatient Hospital Stay: Payer: Medicare HMO | Admitting: Licensed Clinical Social Worker

## 2021-10-07 ENCOUNTER — Encounter: Payer: Self-pay | Admitting: Licensed Clinical Social Worker

## 2021-10-07 ENCOUNTER — Inpatient Hospital Stay (HOSPITAL_BASED_OUTPATIENT_CLINIC_OR_DEPARTMENT_OTHER): Payer: Medicare HMO | Admitting: Nurse Practitioner

## 2021-10-07 VITALS — BP 98/59 | HR 74 | Temp 98.5°F | Resp 19 | Ht 73.0 in | Wt 181.5 lb

## 2021-10-07 DIAGNOSIS — C787 Secondary malignant neoplasm of liver and intrahepatic bile duct: Secondary | ICD-10-CM | POA: Diagnosis not present

## 2021-10-07 DIAGNOSIS — Z7189 Other specified counseling: Secondary | ICD-10-CM

## 2021-10-07 DIAGNOSIS — C799 Secondary malignant neoplasm of unspecified site: Secondary | ICD-10-CM

## 2021-10-07 DIAGNOSIS — C779 Secondary and unspecified malignant neoplasm of lymph node, unspecified: Secondary | ICD-10-CM | POA: Diagnosis not present

## 2021-10-07 DIAGNOSIS — Z515 Encounter for palliative care: Secondary | ICD-10-CM

## 2021-10-07 DIAGNOSIS — R160 Hepatomegaly, not elsewhere classified: Secondary | ICD-10-CM

## 2021-10-07 DIAGNOSIS — Z85048 Personal history of other malignant neoplasm of rectum, rectosigmoid junction, and anus: Secondary | ICD-10-CM | POA: Diagnosis not present

## 2021-10-07 DIAGNOSIS — C161 Malignant neoplasm of fundus of stomach: Secondary | ICD-10-CM

## 2021-10-07 DIAGNOSIS — Z95828 Presence of other vascular implants and grafts: Secondary | ICD-10-CM

## 2021-10-07 DIAGNOSIS — R109 Unspecified abdominal pain: Secondary | ICD-10-CM | POA: Diagnosis not present

## 2021-10-07 DIAGNOSIS — G893 Neoplasm related pain (acute) (chronic): Secondary | ICD-10-CM

## 2021-10-07 DIAGNOSIS — Z9221 Personal history of antineoplastic chemotherapy: Secondary | ICD-10-CM | POA: Diagnosis not present

## 2021-10-07 DIAGNOSIS — G4700 Insomnia, unspecified: Secondary | ICD-10-CM | POA: Insufficient documentation

## 2021-10-07 DIAGNOSIS — R531 Weakness: Secondary | ICD-10-CM

## 2021-10-07 DIAGNOSIS — D649 Anemia, unspecified: Secondary | ICD-10-CM

## 2021-10-07 DIAGNOSIS — Z86718 Personal history of other venous thrombosis and embolism: Secondary | ICD-10-CM | POA: Insufficient documentation

## 2021-10-07 DIAGNOSIS — C169 Malignant neoplasm of stomach, unspecified: Secondary | ICD-10-CM | POA: Insufficient documentation

## 2021-10-07 DIAGNOSIS — Z87891 Personal history of nicotine dependence: Secondary | ICD-10-CM | POA: Insufficient documentation

## 2021-10-07 DIAGNOSIS — I10 Essential (primary) hypertension: Secondary | ICD-10-CM | POA: Insufficient documentation

## 2021-10-07 DIAGNOSIS — Z86711 Personal history of pulmonary embolism: Secondary | ICD-10-CM | POA: Insufficient documentation

## 2021-10-07 DIAGNOSIS — R630 Anorexia: Secondary | ICD-10-CM

## 2021-10-07 DIAGNOSIS — E785 Hyperlipidemia, unspecified: Secondary | ICD-10-CM | POA: Diagnosis not present

## 2021-10-07 DIAGNOSIS — F419 Anxiety disorder, unspecified: Secondary | ICD-10-CM | POA: Diagnosis not present

## 2021-10-07 DIAGNOSIS — Z85828 Personal history of other malignant neoplasm of skin: Secondary | ICD-10-CM | POA: Diagnosis not present

## 2021-10-07 DIAGNOSIS — R69 Illness, unspecified: Secondary | ICD-10-CM | POA: Diagnosis not present

## 2021-10-07 DIAGNOSIS — Z79899 Other long term (current) drug therapy: Secondary | ICD-10-CM | POA: Diagnosis not present

## 2021-10-07 LAB — CBC WITH DIFFERENTIAL/PLATELET
Abs Immature Granulocytes: 0.13 10*3/uL — ABNORMAL HIGH (ref 0.00–0.07)
Basophils Absolute: 0 10*3/uL (ref 0.0–0.1)
Basophils Relative: 0 %
Eosinophils Absolute: 0 10*3/uL (ref 0.0–0.5)
Eosinophils Relative: 0 %
HCT: 20.5 % — ABNORMAL LOW (ref 39.0–52.0)
Hemoglobin: 6.3 g/dL — CL (ref 13.0–17.0)
Immature Granulocytes: 1 %
Lymphocytes Relative: 13 %
Lymphs Abs: 2.3 10*3/uL (ref 0.7–4.0)
MCH: 29 pg (ref 26.0–34.0)
MCHC: 30.7 g/dL (ref 30.0–36.0)
MCV: 94.5 fL (ref 80.0–100.0)
Monocytes Absolute: 2.2 10*3/uL — ABNORMAL HIGH (ref 0.1–1.0)
Monocytes Relative: 13 %
Neutro Abs: 13.2 10*3/uL — ABNORMAL HIGH (ref 1.7–7.7)
Neutrophils Relative %: 73 %
Platelets: 290 10*3/uL (ref 150–400)
RBC: 2.17 MIL/uL — ABNORMAL LOW (ref 4.22–5.81)
RDW: 18 % — ABNORMAL HIGH (ref 11.5–15.5)
WBC: 17.9 10*3/uL — ABNORMAL HIGH (ref 4.0–10.5)
nRBC: 0 % (ref 0.0–0.2)

## 2021-10-07 LAB — COMPREHENSIVE METABOLIC PANEL
ALT: 73 U/L — ABNORMAL HIGH (ref 0–44)
AST: 164 U/L — ABNORMAL HIGH (ref 15–41)
Albumin: 2.7 g/dL — ABNORMAL LOW (ref 3.5–5.0)
Alkaline Phosphatase: 587 U/L — ABNORMAL HIGH (ref 38–126)
Anion gap: 8 (ref 5–15)
BUN: 31 mg/dL — ABNORMAL HIGH (ref 8–23)
CO2: 24 mmol/L (ref 22–32)
Calcium: 8.4 mg/dL — ABNORMAL LOW (ref 8.9–10.3)
Chloride: 100 mmol/L (ref 98–111)
Creatinine, Ser: 0.83 mg/dL (ref 0.61–1.24)
GFR, Estimated: >60 mL/min (ref >60)
Glucose, Bld: 120 mg/dL — ABNORMAL HIGH (ref 70–99)
Potassium: 4.3 mmol/L (ref 3.5–5.1)
Sodium: 132 mmol/L — ABNORMAL LOW (ref 135–145)
Total Bilirubin: 0.8 mg/dL (ref 0.3–1.2)
Total Protein: 5.9 g/dL — ABNORMAL LOW (ref 6.5–8.1)

## 2021-10-07 LAB — IRON AND IRON BINDING CAPACITY (CC-WL,HP ONLY)
Iron: 44 ug/dL — ABNORMAL LOW (ref 45–182)
Saturation Ratios: 17 % — ABNORMAL LOW (ref 17.9–39.5)
TIBC: 259 ug/dL (ref 250–450)
UIBC: 215 ug/dL (ref 117–376)

## 2021-10-07 LAB — PREPARE RBC (CROSSMATCH)

## 2021-10-07 LAB — CEA (IN HOUSE-CHCC): CEA (CHCC-In House): 825.45 ng/mL — ABNORMAL HIGH (ref 0.00–5.00)

## 2021-10-07 LAB — FERRITIN: Ferritin: 986 ng/mL — ABNORMAL HIGH (ref 24–336)

## 2021-10-07 MED ORDER — SODIUM CHLORIDE 0.9% FLUSH
10.0000 mL | Freq: Once | INTRAVENOUS | Status: AC
  Administered 2021-10-07: 10 mL

## 2021-10-07 MED ORDER — MORPHINE SULFATE ER 15 MG PO TBCR: 15.0000 mg | EXTENDED_RELEASE_TABLET | Freq: Three times a day (TID) | ORAL | 0 refills | Status: AC

## 2021-10-07 MED ORDER — HEPARIN SOD (PORK) LOCK FLUSH 100 UNIT/ML IV SOLN
500.0000 [IU] | Freq: Once | INTRAVENOUS | Status: AC
  Administered 2021-10-07: 500 [IU]

## 2021-10-07 NOTE — Progress Notes (Signed)
Nikki in [facility] confirmed order T&S, ABO, and Prepare.  Pt is scheduled on 10/08/2021 for 2 units of PRBCs.  Dr. Burr Medico placed order for Attestation for blood.  Pt currently has active consent on file for blood transfusion. ?

## 2021-10-07 NOTE — Progress Notes (Signed)
[name]  [phone] [phone]   Name: Billy [name] Date: 10/07/2021 MRN: [id number]  DOB: 1941/01/29  Patient Care Team: Biagio Borg, MD as PCP - General (Internal Medicine) Debara Pickett Nadean Corwin, MD as PCP - Cardiology (Cardiology) Lavonna Monarch, MD (Dermatology) Warren Danes, PA-C as Physician Assistant (Dermatology) Truitt Merle, MD as Consulting Physician (Oncology) Royston Bake, RN as Oncology Nurse Navigator (Oncology) Pickenpack-Cousar, Carlena Sax, NP as Nurse Practitioner (Nurse Practitioner)    REASON FOR CONSULTATION: Billy [name] is a 81 y.o. male with medical history including massive PE and RLE DVT (Eliquis) s/p IVC filter, basal and squamous cell, now with stage IV metastatic gastric adenocarcinomal with liver, lymphnodes, and lung involvement. Patient began FOLFOX (1/23) with progression. Palliative ask to see for symptom management and goals of care.    SOCIAL HISTORY:     reports that he quit smoking about 43 years ago. His smoking use included cigarettes. He has a 20.00 pack-year smoking history. He has never used smokeless tobacco. He reports current alcohol use of about 25.0 standard drinks per week. He reports that he does not use drugs.  ADVANCE DIRECTIVES:  Patient has documented advanced directive. I have personally reviewed documents. Per document patient desires for natural death in the setting of terminal and incurable conditions.   CODE STATUS: DNR  PAST MEDICAL HISTORY: Past Medical History:  Diagnosis Date   Basal cell carcinoma 05/29/2012   left thigh-(EXC)   Basal cell carcinoma 10/23/2012   right side of nose (MOHS)   Basal cell carcinoma 12/12/2012   nod-irght cheek (MOHS)   Basal cell carcinoma 05/26/2015   nod-left calf (CX35FU)   Basal cell carcinoma 05/26/2015   nod-left nose (txpbx)   Basal cell carcinoma 06/08/2016   nod-above right brow (CX35FU)   Basal cell  carcinoma 09/12/2016   left nose (MOHS)   Basal cell carcinoma 01/29/2019   nod-right tip of nose (MOHS)   Benign neoplasm of colon    Finger fracture    Hx of colonoscopy    Hyperlipidemia    Hypertension    Internal hemorrhoids without mention of complication    Myocardial infarction ([place])    09/01/2015   Plantar fascial fibromatosis    Prostatitis    Rib fracture    Squamous cell carcinoma of skin 07/13/2009   RIght lower outer leg   Squamous cell carcinoma of skin 02/17/2014   in situ-right temple (txpbx)   Squamous cell carcinoma of skin 01/24/2018   in situ-right chest (txpbx)   Squamous cell carcinoma of skin 01/24/2018   in situ0 right forehead (txpbx)   Squamous cell carcinoma of skin     PAST SURGICAL HISTORY:  Past Surgical History:  Procedure Laterality Date   cardiac stint  2017   COLONOSCOPY  2016   IR IMAGING GUIDED PORT INSERTION  07/12/2021   IR IVC FILTER PLMT / S&I /IMG GUID/MOD SED  08/29/2021   IR US GUIDE BX ASP/DRAIN  07/12/2021   POLYPECTOMY     TONSILLECTOMY     VARICOCELECTOMY     Left    HEMATOLOGY/ONCOLOGY HISTORY:  Oncology History Overview Note   Cancer Staging  Gastric cancer ([place]) Staging form: Stomach, AJCC 8th Edition - Clinical stage from 07/19/2021: Stage IVB (cTX, cN2, pM1) - Signed by Truitt Merle, MD on 09/16/2021    Gastric cancer ([place])  07/01/2021 Imaging   EXAM: CT ANGIOGRAPHY CHEST WITH CONTRAST  IMPRESSION:  1. No evidence of a pulmonary embolism. 2. Findings consistent with neoplastic disease. Multiple liver masses consistent with widespread hepatic metastatic disease. Ill-defined partly imaged mass along the porta hepatis suspicious for a pancreatic malignancy. There also multiple subcentimeter lung nodules consistent with metastatic disease. Recommend follow-up CT of the abdomen and pelvis with contrast for further assessment 3. No acute findings in the chest. 4. Dilated ascending thoracic aorta to 4.7 cm. Ascending  thoracic aortic aneurysm. Recommend semi-annual imaging followup by CTA or MRA and referral to cardiothoracic surgery if not already obtained. This recommendation follows 2010 ACCF/AHA/AATS/ACR/ASA/SCA/SCAI/SIR/STS/SVM Guidelines for the Diagnosis and Management of Patients With Thoracic Aortic Disease. Circulation. 2010; 121: J188-C166. Aortic aneurysm NOS (ICD10-I71.9)     07/01/2021 Imaging   EXAM: CT ABDOMEN AND PELVIS WITH CONTRAST  IMPRESSION: 1. Numerous hypodense hepatic lesions with dominant lesion in the inferior right lobe measuring up to 5.9 cm. Findings worrisome for diffuse metastatic disease. Primary hepatic neoplasm not excluded in the inferior right lobe. 2. Pathologically enlarged periportal, portal caval, peripancreatic and aortocaval lymph nodes worrisome for metastatic disease. 3. Bilateral inguinal hernias. Left inguinal hernia contains colon. No bowel obstruction. 4.  Aortic Atherosclerosis (ICD10-I70.0).   07/12/2021 Pathology Results   FINAL MICROSCOPIC DIAGNOSIS:   A. LEFT LIVER MASS, NEEDLE CORE BIOPSY:  Metastatic adenocarcinoma.  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   Comment: The morphologic features of the neoplasm are most consistent with metastasis from a primary colorectal adenocarcinoma.  However, possibility of primary in the upper GI tract and pancreaticobiliary tree cannot be excluded.  Markers for lung adenocarcinoma are negative.   ADDENDUM:  HER2 by immunohistochemistry is POSITIVE (3+).  ADDENDUM:  Mismatch Repair Protein (IHC)  SUMMARY INTERPRETATION: NORMAL   07/19/2021 Procedure   Upper Endoscopy, Dr. Fuller Plan  Impression: - Normal esophagus. - Malignant gastric tumor in the gastric fundus. Biopsied. - Normal duodenal bulb and second portion of the duodenum.   07/19/2021 Pathology Results   Diagnosis Stomach, biopsy, Mass - INVASIVE MODERATE TO POORLY DIFFERENTIATED ADENOCARCINOMA,   07/19/2021 Cancer Staging    Staging form: Stomach, AJCC 8th Edition - Clinical stage from 07/19/2021: Stage IVB (cTX, cN2, pM1) - Signed by Truitt Merle, MD on 09/16/2021 Stage prefix: Initial diagnosis    07/21/2021 Initial Diagnosis   Gastric cancer ([place])   08/11/2021 -  Chemotherapy   Patient is on Treatment Plan : GASTROESOPHAGEAL FOLFOX q14d x 6 cycles     08/18/2021 -  Chemotherapy   Patient is on Treatment Plan : Gastric - Herceptin & Keytruda Q 21 days     08/24/2021 Imaging   New baseline CT CAP IMPRESSION: 1. Mild interval progression of mediastinal and right hilar lymphadenopathy. 2. Similar appearance of numerous bilateral pulmonary nodules compatible with metastatic disease. 3. Interval progression of innumerable hepatic metastases. 4. Similar to mildly progressive necrotic lymphadenopathy in the hepatoduodenal ligament and aortocaval space. 5. Small inferior splenic infarct, new since prior. 6. Bilateral groin hernias containing small bowel and colon, respectively, without complicating features. 7. 4.5 cm ascending thoracic aortic aneurysm. Recommend semi-annual imaging followup by CTA or MRA and referral to cardiothoracic surgery if not already obtained.    ADDENDUM: Upper normal 10 mm short axis left thoracic inlet node on 04/02. 11 mm short axis left supraclavicular node on image 3/2 is upper normal to mildly enlarged.     ALLERGIES:  is allergic to ofloxacin.  MEDICATIONS:  Current Outpatient Medications  Medication Sig Dispense Refill   ALPRAZolam (XANAX) 0.5 MG  tablet Take 1 tablet (0.5 mg total) by mouth 2 (two) times daily as needed for anxiety. 60 tablet 0   atorvastatin (LIPITOR) 80 MG tablet Take 1 tablet (80 mg total) by mouth daily at 6 PM. 90 tablet 3   Cholecalciferol (VITAMIN D3 PO) Take 1 tablet by mouth daily.     lidocaine-prilocaine (EMLA) cream Apply 1 application topically as needed. Apply 0.33gm to PortaCath site 30 to 60 mins prior to PortaCath access. 30 g 2   lisinopril  (ZESTRIL) 2.5 MG tablet Take 1 tablet (2.5 mg total) by mouth daily. 90 tablet 3   mirtazapine (REMERON) 7.5 MG tablet Take 1 tablet (7.5 mg total) by mouth at bedtime. 30 tablet 1   morphine (MS CONTIN) 15 MG 12 hr tablet Take 1 tablet (15 mg total) by mouth every 8 (eight) hours. 60 tablet 0   nitroGLYCERIN (NITROSTAT) 0.4 MG SL tablet Place 1 tablet (0.4 mg total) under the tongue every 5 (five) minutes as needed for chest pain. Max 3 doses. 25 tablet 3   Omega-3 Fatty Acids (FISH OIL) 500 MG CAPS Take 500 mg by mouth daily.     ondansetron (ZOFRAN) 8 MG tablet Take 1 tablet (8 mg total) by mouth every 8 (eight) hours as needed for nausea or vomiting. 30 tablet 3   oxyCODONE (OXY IR/ROXICODONE) 5 MG immediate release tablet Take 1 tablet (5 mg total) by mouth every 4 (four) hours as needed for severe pain. 30 tablet 0   prochlorperazine (COMPAZINE) 10 MG tablet Take 1 tablet (10 mg total) by mouth every 6 (six) hours as needed for nausea or vomiting. 30 tablet 3   rivaroxaban (XARELTO) 10 MG TABS tablet Take 1 tablet (10 mg total) by mouth daily. 30 tablet 0   sertraline (ZOLOFT) 25 MG tablet Take 1 tablet (25 mg total) by mouth daily. 30 tablet 0   No current facility-administered medications for this visit.    VITAL SIGNS: There were no vitals taken for this visit. There were no vitals filed for this visit.  Estimated body mass index is 23.95 kg/m as calculated from the following:   Height as of an earlier encounter on 10/07/21: _0  (1.854 m).   Weight as of an earlier encounter on 10/07/21: 181 lb 8 oz (82.3 kg).  LABS: CBC:    Component Value Date/Time   WBC 17.9 (H) 10/07/2021 1240   HGB 6.3 (LL) 10/07/2021 1240   HGB 13.5 07/06/2021 1430   HCT 20.5 (L) 10/07/2021 1240   PLT 290 10/07/2021 1240   PLT 249 07/06/2021 1430   MCV 94.5 10/07/2021 1240   NEUTROABS 13.2 (H) 10/07/2021 1240   LYMPHSABS 2.3 10/07/2021 1240   MONOABS 2.2 (H) 10/07/2021 1240   EOSABS 0.0 10/07/2021  1240   BASOSABS 0.0 10/07/2021 1240   Comprehensive Metabolic Panel:    Component Value Date/Time   NA 132 (L) 10/07/2021 1240   K 4.3 10/07/2021 1240   CL 100 10/07/2021 1240   CO2 24 10/07/2021 1240   BUN 31 (H) 10/07/2021 1240   CREATININE 0.83 10/07/2021 1240   CREATININE 0.94 07/06/2021 1430   CREATININE 0.81 08/11/2016 1041   GLUCOSE 120 (H) 10/07/2021 1240   CALCIUM 8.4 (L) 10/07/2021 1240   AST 164 (H) 10/07/2021 1240   AST 83 (H) 07/06/2021 1430   ALT 73 (H) 10/07/2021 1240   ALT 52 (H) 07/06/2021 1430   ALKPHOS 587 (H) 10/07/2021 1240   BILITOT 0.8 10/07/2021 1240  BILITOT 0.8 07/06/2021 1430   PROT 5.9 (L) 10/07/2021 1240   ALBUMIN 2.7 (L) 10/07/2021 1240    RADIOGRAPHIC STUDIES: No results found.  PERFORMANCE STATUS (ECOG) : 2 - Symptomatic, <50% confined to bed  Review of Systems  Constitutional:  Positive for activity change, appetite change and fatigue.  Musculoskeletal:  Positive for arthralgias and back pain.  Unless otherwise noted, a complete review of systems is negative.  Physical Exam General: NAD, appears uncomfortable in wheelchair  Cardiovascular: hypotensive  Pulmonary: normal breathing pattern  Extremities: no edema, no joint deformities Neurological: Weakness but otherwise nonfocal  IMPRESSION: This is my initial visit with Billy [name]. He was seen by Dr. Burr Medico today and I was asked to support for symptom management and goals of care. He appears uncomfortable. Is sitting in a wheelchair. Complains of back pain and sacral pain. Assisted with repositioning and placed a pillow under sacral area.    I introduced myself, Advertising copywriter, and Palliative's role in collaboration with the oncology team. Concept of Palliative Care was introduced as specialized medical care for people and their families living with serious illness.  It focuses on providing relief from the symptoms and stress of a serious illness.  The goal is to improve quality of life for  both the patient and the family. Values and goals of care important to patient and family were attempted to be elicited.   Patient and wife verbalized understanding. Billy [name] shares understanding of the severity of his cancer. He is clear in his expressed wishes that he would like to be home with his family in a more comfortable state. He endorses ongoing fatigue, decreased appetite, and uncontrolled pain.   Dr. Burr Medico had a long discussion with patient with recommendations for outpatient hospice support. Billy [name] and his wife would like some time to speak with family.   His wife shares they are connected to outpatient Palliative. On chart review Santiago Glad, NP recently visited them in the home on 09/30/21. Patient would like to continue under their services.   Neoplasm related pain We discussed his current pain regimen. He shares he is taking MS Contin 15 mg twice daily. His pain is uncontrolled however he has not been taking breakthrough medication (Oxy IR) except for once daily. Given uncontrolled pain recommendations to increase MS Contin to every 8 hours. I also encouraged patient to take 65m Oxy IR every four hours as needed for breakthrough pain. Patient and wife verbalized understanding.   We discussed Her current illness and what it means in the larger context of Her on-going co-morbidities. Natural disease trajectory and expectations were discussed.  I discussed the importance of continued conversation with family and their medical providers regarding overall plan of care and treatment options, ensuring decisions are within the context of the patients values and GOCs.  PLAN: MS Contin 15 mg every 8 hours.  Oxycodone (Oxy IR) 55mevery 4 hours as needed for breakthrough pain.  Patient is scheduled per Dr. FeBurr Medicoor blood transfusion tomorrow. Hemoglobin 6.3.  Ongoing goals of care discussion. Billy [name] in expressed wishes to be in his home for what time he has left with family and in  a more comfortable state. Recommendations for outpatient hospice given poor long-term prognosis. They are connected to [facility]'s palliative program. Once final decisions are made they may transition from Palliative to a more [facility] level [facility]  Written documentation of medication changes and adjustments was provided.  I will plan to  follow-up with patient and wife Monday or Tuesday via phone or virtual visit to evaluate pain and also be available for any further questions and decision making. Jarrett Soho, RN spoke with wife and she has requested to give her a call on Monday in regards to follow-up.   Patient expressed understanding and was in agreement with this plan. He also understands that He can call the clinic at any time with any questions, concerns, or complaints.   Time Total: 40 min   Visit consisted of counseling and education dealing with the complex and emotionally intense issues of symptom management and palliative care in the setting of serious and potentially life-threatening illness.Greater than 50%  of this time was spent counseling and coordinating care related to the above assessment and plan.  Signed by: Alda Lea, AGPCNP-BC Palliative Medicine Team/[place]

## 2021-10-07 NOTE — Patient Instructions (Signed)
Thank you for allowing me to assist in your care today.  ? ?My name is Isabellamarie Randa "Nikki" Shirlean Kelly, NP (Palliative) here at [facility] with Dr. Burr Medico.  ? ?Today we discussed medication changes with a goal of gaining better comfort: ? ?Please begin taking morphine (MS Contin) every 8 hours. This has changed from taking twice a day.  ?Please take your Oxycodone (Oxy IR) every 4 hours as needed for breakthrough pain.  ?Stop taking Eliquis (blood thinner) as instructed by Dr. Burr Medico ? ? ?Please call if you need anything. We will plan to follow-up in a week for further discussions and support.  ?

## 2021-10-07 NOTE — Telephone Encounter (Signed)
Oral Chemotherapy Pharmacist Encounter  ? ?Oral chemotherapy clinic will sign off at this time as patient is not proceeding with treatment with Xeloda (capecitabine). ? ?[facility] notified.  ? ?Leron Croak, PharmD, BCPS ?Hematology/Oncology Clinical Pharmacist ?[facility] ?[phone] ?10/07/2021 1:42 PM ? ? ? ? ?

## 2021-10-07 NOTE — Progress Notes (Signed)
[place]   [phone]) [phone] [phone]) [phone]   Clinic Follow up Note   Patient Care Team: Biagio Borg, MD as PCP - General (Internal Medicine) Debara Pickett Nadean Corwin, MD as PCP - Cardiology (Cardiology) Lavonna Monarch, MD (Dermatology) Warren Danes, PA-C as Physician Assistant (Dermatology) Truitt Merle, MD as Consulting Physician (Oncology) Royston Bake, RN as Oncology Nurse Navigator (Oncology) Pickenpack-Cousar, Carlena Sax, NP as Nurse Practitioner (Nurse Practitioner)  Date of Service:  10/07/2021  CHIEF COMPLAINT: f/u of gastric cancer  CURRENT THERAPY:  Pending  ASSESSMENT & PLAN:  Billy [name] is a 81 y.o. male with   1. Massive PE and RLE DVT -following chemo cycle 2, he developed shortness of breath and fever. He went to ED and was admitted. CTA chest showed right subsegmental PE, and troponins were trending upward suggesting at least submassive PE. -he started heparin on admission, but this was stopped after he developed a pelvic hematoma and bleeding. Now s/p IVC filter placement. -due to his persistent right lower extremity pain and weakness, may be partially related to the DVT, and highest high risk for recurrent thrombosis, I started him on a low-dose Xarelto 10 mg daily on last visit -he has developed melena and hemoglobin dropped to 6.3 today, will stop Xarelto.  2. Metastatic gastric adenocarcinoma to liver, nodes, and possible lungs, cTxNxM1, stage IV, MMR normal, HER2 (+) -presented with epigastric pain. CT CAP on 07/01/21 showed multiple hepatic lesions measuring up to 5.9 cm and pathologically enlarged abdominal lymph nodes, and multiple small lung nodules up to 7m. -liver biopsy on 07/12/21 showed metastatic adenocarcinoma, most consistent with primary colorectal or up GI -upper endoscopy on 07/19/21 by Dr. SFuller Planshowed a 4 cm gastric fundus mass. Pathology confirmed invasive moderately to poorly differentiated adenocarcinoma.  Additional testing showed Her2+ and MMR normal. -He began FOLFOX 08/11/21, tolerated first week well with mild fatigue.  -given his Her2 positivity, he started trastuzumab and pembrolizumab on 08/18/21.  -He unfortunately developed massive PE after second cycle of chemotherapy, with multiple complications.  His performance status has significantly decreased since then. -He remains to be very frail, with pain in right leg, back and abdomen. He is a poor candidate for chemotherapy.  I discussed the option of low intensity chemo Xeloda and trastuzumab, versus palliative care and hospice. -After lengthy discussion, patient decided not to pursue more chemotherapy, and is leaning towards palliative care and hospice.  He will discuss with his family members before makes the final decision. -I asked palliative care NP NLexine Batonto see him today.   3. Abdominal Pain and right leg pain  -His abdominal pain has improved and controlled -Unfortunately he has significant low back and right leg pain, due to the large hematoma from anticoagulation -Continue MS Contin and oxycodone as needed   4. Goal of care discussion, partial DNR, Social Support -He understands the incurable nature of his cancer, and the overall poor prognosis, especially if he does not have good response to chemotherapy or progress on chemo -The patient understands the goal of care is palliative. -they have two children that live here in town. -he has a living well. He does not want to be on life support if he is terminal    5. Anxiety and insomnia  -he has xanax as needed  -Mirtazapine has been helpful, refilled  6. RLE weakness and pain -secondary to right iliacs muscular hematoma and right lower extremity DVT -Continue pain management, I encouraged him to  exercise   7. Worsening anemia  -Likely due to GI bleeding from gastric cancer, will stop Xarelto -2u blood transfusion tomorrow  PLAN: -I called in Xarelto 10 mg daily, he was  started  -lab, flush, and f/u in 3 weeks  Plan -I had long conversation about goals of care with patient and his wife today  -pt opted not to have more chemo  -I recommend home hospice care, he will discuss with his family  -he will be seen by palliative NP Lexine Baton today    No problem-specific Assessment & Plan notes found for this encounter.   SUMMARY OF ONCOLOGIC HISTORY: Oncology History Overview Note   Cancer Staging  Gastric cancer [facility]) Staging form: Stomach, AJCC 8th Edition - Clinical stage from 07/19/2021: Stage IVB (cTX, cN2, pM1) - Signed by Truitt Merle, MD on 09/16/2021    Gastric cancer ([place])  07/01/2021 Imaging   EXAM: CT ANGIOGRAPHY CHEST WITH CONTRAST  IMPRESSION: 1. No evidence of a pulmonary embolism. 2. Findings consistent with neoplastic disease. Multiple liver masses consistent with widespread hepatic metastatic disease. Ill-defined partly imaged mass along the porta hepatis suspicious for a pancreatic malignancy. There also multiple subcentimeter lung nodules consistent with metastatic disease. Recommend follow-up CT of the abdomen and pelvis with contrast for further assessment 3. No acute findings in the chest. 4. Dilated ascending thoracic aorta to 4.7 cm. Ascending thoracic aortic aneurysm. Recommend semi-annual imaging followup by CTA or MRA and referral to cardiothoracic surgery if not already obtained. This recommendation follows 2010 ACCF/AHA/AATS/ACR/ASA/SCA/SCAI/SIR/STS/SVM Guidelines for the Diagnosis and Management of Patients With Thoracic Aortic Disease. Circulation. 2010; 121: A540-J811. Aortic aneurysm NOS (ICD10-I71.9)     07/01/2021 Imaging   EXAM: CT ABDOMEN AND PELVIS WITH CONTRAST  IMPRESSION: 1. Numerous hypodense hepatic lesions with dominant lesion in the inferior right lobe measuring up to 5.9 cm. Findings worrisome for diffuse metastatic disease. Primary hepatic neoplasm not excluded in the inferior right lobe. 2. Pathologically  enlarged periportal, portal caval, peripancreatic and aortocaval lymph nodes worrisome for metastatic disease. 3. Bilateral inguinal hernias. Left inguinal hernia contains colon. No bowel obstruction. 4.  Aortic Atherosclerosis (ICD10-I70.0).   07/12/2021 Pathology Results   FINAL MICROSCOPIC DIAGNOSIS:   A. LEFT LIVER MASS, NEEDLE CORE BIOPSY:  Metastatic adenocarcinoma.  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   Comment: The morphologic features of the neoplasm are most consistent with metastasis from a primary colorectal adenocarcinoma.  However, possibility of primary in the upper GI tract and pancreaticobiliary tree cannot be excluded.  Markers for lung adenocarcinoma are negative.   ADDENDUM:  HER2 by immunohistochemistry is POSITIVE (3+).  ADDENDUM:  Mismatch Repair Protein (IHC)  SUMMARY INTERPRETATION: NORMAL   07/19/2021 Procedure   Upper Endoscopy, Dr. Fuller Plan  Impression: - Normal esophagus. - Malignant gastric tumor in the gastric fundus. Biopsied. - Normal duodenal bulb and second portion of the duodenum.   07/19/2021 Pathology Results   Diagnosis Stomach, biopsy, Mass - INVASIVE MODERATE TO POORLY DIFFERENTIATED ADENOCARCINOMA,   07/19/2021 Cancer Staging   Staging form: Stomach, AJCC 8th Edition - Clinical stage from 07/19/2021: Stage IVB (cTX, cN2, pM1) - Signed by Truitt Merle, MD on 09/16/2021 Stage prefix: Initial diagnosis    07/21/2021 Initial Diagnosis   Gastric cancer ([place])   08/11/2021 -  Chemotherapy   Patient is on Treatment Plan : GASTROESOPHAGEAL FOLFOX q14d x 6 cycles     08/18/2021 -  Chemotherapy   Patient is on Treatment Plan : Gastric - Herceptin & Keytruda Q  21 days     08/24/2021 Imaging   New baseline CT CAP IMPRESSION: 1. Mild interval progression of mediastinal and right hilar lymphadenopathy. 2. Similar appearance of numerous bilateral pulmonary nodules compatible with metastatic disease. 3. Interval progression of  innumerable hepatic metastases. 4. Similar to mildly progressive necrotic lymphadenopathy in the hepatoduodenal ligament and aortocaval space. 5. Small inferior splenic infarct, new since prior. 6. Bilateral groin hernias containing small bowel and colon, respectively, without complicating features. 7. 4.5 cm ascending thoracic aortic aneurysm. Recommend semi-annual imaging followup by CTA or MRA and referral to cardiothoracic surgery if not already obtained.    ADDENDUM: Upper normal 10 mm short axis left thoracic inlet node on 04/02. 11 mm short axis left supraclavicular node on image 3/2 is upper normal to mildly enlarged.      INTERVAL HISTORY:  Billy [name] is here for a follow up of gastric cancer.he came in a wheelchair with his wife.  He remains to be very frail with generalized weakness, dyspnea on exertion.  His main complaint is is low back and right lower extremity pain, which has limited his mobility.  He spends most time in chair during the day, appetite is fair, weight is stable.  He noticed melena once a day for the past few weeks, no other signs of bleeding.  He needs assistance for some of his ADLs, such as showering.   All other systems were reviewed with the patient and are negative.  MEDICAL HISTORY:  Past Medical History:  Diagnosis Date   Basal cell carcinoma 05/29/2012   left thigh-(EXC)   Basal cell carcinoma 10/23/2012   right side of nose (MOHS)   Basal cell carcinoma 12/12/2012   nod-irght cheek (MOHS)   Basal cell carcinoma 05/26/2015   nod-left calf (CX35FU)   Basal cell carcinoma 05/26/2015   nod-left nose (txpbx)   Basal cell carcinoma 06/08/2016   nod-above right brow (CX35FU)   Basal cell carcinoma 09/12/2016   left nose (MOHS)   Basal cell carcinoma 01/29/2019   nod-right tip of nose (MOHS)   Benign neoplasm of colon    Finger fracture    Hx of colonoscopy    Hyperlipidemia    Hypertension    Internal hemorrhoids without mention of  complication    Myocardial infarction ([place])    09/01/2015   Plantar fascial fibromatosis    Prostatitis    Rib fracture    Squamous cell carcinoma of skin 07/13/2009   RIght lower outer leg   Squamous cell carcinoma of skin 02/17/2014   in situ-right temple (txpbx)   Squamous cell carcinoma of skin 01/24/2018   in situ-right chest (txpbx)   Squamous cell carcinoma of skin 01/24/2018   in situ0 right forehead (txpbx)   Squamous cell carcinoma of skin     SURGICAL HISTORY: Past Surgical History:  Procedure Laterality Date   cardiac stint  2017   COLONOSCOPY  2016   IR IMAGING GUIDED PORT INSERTION  07/12/2021   IR IVC FILTER PLMT / S&I /IMG GUID/MOD SED  08/29/2021   IR US GUIDE BX ASP/DRAIN  07/12/2021   POLYPECTOMY     TONSILLECTOMY     VARICOCELECTOMY     Left    I have reviewed the social history and family history with the patient and they are unchanged from previous note.  ALLERGIES:  is allergic to ofloxacin.  MEDICATIONS:  Current Outpatient Medications  Medication Sig Dispense Refill   ALPRAZolam (XANAX) 0.5 MG tablet Take 1  tablet (0.5 mg total) by mouth 2 (two) times daily as needed for anxiety. 60 tablet 0   atorvastatin (LIPITOR) 80 MG tablet Take 1 tablet (80 mg total) by mouth daily at 6 PM. 90 tablet 3   Cholecalciferol (VITAMIN D3 PO) Take 1 tablet by mouth daily.     lidocaine-prilocaine (EMLA) cream Apply 1 application topically as needed. Apply 0.33gm to PortaCath site 30 to 60 mins prior to PortaCath access. 30 g 2   lisinopril (ZESTRIL) 2.5 MG tablet Take 1 tablet (2.5 mg total) by mouth daily. 90 tablet 3   mirtazapine (REMERON) 7.5 MG tablet Take 1 tablet (7.5 mg total) by mouth at bedtime. 30 tablet 1   morphine (MS CONTIN) 15 MG 12 hr tablet Take 1 tablet (15 mg total) by mouth every 8 (eight) hours. 60 tablet 0   nitroGLYCERIN (NITROSTAT) 0.4 MG SL tablet Place 1 tablet (0.4 mg total) under the tongue every 5 (five) minutes as needed for chest pain.  Max 3 doses. 25 tablet 3   Omega-3 Fatty Acids (FISH OIL) 500 MG CAPS Take 500 mg by mouth daily.     ondansetron (ZOFRAN) 8 MG tablet Take 1 tablet (8 mg total) by mouth every 8 (eight) hours as needed for nausea or vomiting. 30 tablet 3   oxyCODONE (OXY IR/ROXICODONE) 5 MG immediate release tablet Take 1 tablet (5 mg total) by mouth every 4 (four) hours as needed for severe pain. 30 tablet 0   prochlorperazine (COMPAZINE) 10 MG tablet Take 1 tablet (10 mg total) by mouth every 6 (six) hours as needed for nausea or vomiting. 30 tablet 3   rivaroxaban (XARELTO) 10 MG TABS tablet Take 1 tablet (10 mg total) by mouth daily. 30 tablet 0   sertraline (ZOLOFT) 25 MG tablet Take 1 tablet (25 mg total) by mouth daily. 30 tablet 0   No current facility-administered medications for this visit.    PHYSICAL EXAMINATION: ECOG PERFORMANCE STATUS: 3 - Symptomatic, >50% confined to bed  Vitals:   10/07/21 1308  BP: (!) 98/59  Pulse: 74  Resp: 19  Temp: 98.5 F (36.9 C)  SpO2: 97%   Wt Readings from Last 3 Encounters:  10/07/21 181 lb 8 oz (82.3 kg)  09/16/21 181 lb (82.1 kg)  08/25/21 184 lb 4.8 oz (83.6 kg)     GENERAL:alert, no distress and comfortable SKIN: skin color normal, no rashes or significant lesions EYES: normal, Conjunctiva are pink and non-injected, sclera clear  NEURO: alert & oriented x 3 with fluent speech  LABORATORY DATA:  I have reviewed the data as listed CBC Latest Ref Rng & Units 10/07/2021 09/16/2021 09/03/2021  WBC 4.0 - 10.5 K/uL 17.9(H) 8.9 14.2(H)  Hemoglobin 13.0 - 17.0 g/dL 6.3(LL) 10.3(L) 8.8(L)  Hematocrit 39.0 - 52.0 % 20.5(L) 32.7(L) 27.4(L)  Platelets 150 - 400 K/uL 290 242 198     CMP Latest Ref Rng & Units 10/07/2021 09/16/2021 08/31/2021  Glucose 70 - 99 mg/dL 120(H) 127(H) 126(H)  BUN 8 - 23 mg/dL 31(H) 17 34(H)  Creatinine 0.61 - 1.24 mg/dL 0.83 0.83 0.80  Sodium 135 - 145 mmol/L 132(L) 134(L) 134(L)  Potassium 3.5 - 5.1 mmol/L 4.3 4.2 4.6  Chloride  98 - 111 mmol/L 100 103 102  CO2 22 - 32 mmol/L _0 Calcium 8.9 - 10.3 mg/dL 8.4(L) 8.7(L) 8.2(L)  Total Protein 6.5 - 8.1 g/dL 5.9(L) 6.4(L) -  Total Bilirubin 0.3 - 1.2 mg/dL 0.8 0.8 -  Alkaline  Phos 38 - 126 U/L 587(H) 396(H) -  AST 15 - 41 U/L 164(H) 143(H) -  ALT 0 - 44 U/L 73(H) 76(H) -      RADIOGRAPHIC STUDIES: I have personally reviewed the radiological images as listed and agreed with the findings in the report. No results found.    No orders of the defined types were placed in this encounter.  All questions were answered. The patient knows to call the clinic with any problems, questions or concerns. No barriers to learning was detected. The total time spent in the appointment was 40 minutes.     Truitt Merle, MD 10/07/2021

## 2021-10-07 NOTE — Progress Notes (Signed)
[facility] CSW Progress Note ? ?Clinical Social Worker met with patient and spouse following medical appointment to discuss supportive services.  Pt stating he would like to pursue a palliative approach as his pain has become increasingly difficult to manage.  Questions answered regarding palliative and hospice services.  Emotional support provided.  Pt's spouse enquired about supportive counseling for herself.  Supportive services discussed at length.  Contact details provided for both CSW as well as the [facility] to sign up for services.  CSW to call spouse next week to follow up with any questions she may have and provide additional emotional support.   ? ? ? ?Henriette Combs , LCSW ?

## 2021-10-08 ENCOUNTER — Other Ambulatory Visit: Payer: Self-pay

## 2021-10-08 ENCOUNTER — Other Ambulatory Visit: Payer: Self-pay | Admitting: Hematology

## 2021-10-08 ENCOUNTER — Inpatient Hospital Stay: Payer: Medicare HMO

## 2021-10-08 VITALS — BP 98/65 | HR 64 | Temp 97.8°F | Resp 18 | Ht 73.0 in

## 2021-10-08 DIAGNOSIS — D649 Anemia, unspecified: Secondary | ICD-10-CM

## 2021-10-08 DIAGNOSIS — C161 Malignant neoplasm of fundus of stomach: Secondary | ICD-10-CM

## 2021-10-08 DIAGNOSIS — R109 Unspecified abdominal pain: Secondary | ICD-10-CM | POA: Diagnosis not present

## 2021-10-08 DIAGNOSIS — C787 Secondary malignant neoplasm of liver and intrahepatic bile duct: Secondary | ICD-10-CM | POA: Diagnosis not present

## 2021-10-08 DIAGNOSIS — I10 Essential (primary) hypertension: Secondary | ICD-10-CM | POA: Diagnosis not present

## 2021-10-08 DIAGNOSIS — C779 Secondary and unspecified malignant neoplasm of lymph node, unspecified: Secondary | ICD-10-CM | POA: Diagnosis not present

## 2021-10-08 DIAGNOSIS — R160 Hepatomegaly, not elsewhere classified: Secondary | ICD-10-CM

## 2021-10-08 DIAGNOSIS — C169 Malignant neoplasm of stomach, unspecified: Secondary | ICD-10-CM | POA: Diagnosis not present

## 2021-10-08 DIAGNOSIS — C799 Secondary malignant neoplasm of unspecified site: Secondary | ICD-10-CM

## 2021-10-08 DIAGNOSIS — Z95828 Presence of other vascular implants and grafts: Secondary | ICD-10-CM

## 2021-10-08 DIAGNOSIS — R69 Illness, unspecified: Secondary | ICD-10-CM | POA: Diagnosis not present

## 2021-10-08 DIAGNOSIS — E785 Hyperlipidemia, unspecified: Secondary | ICD-10-CM | POA: Diagnosis not present

## 2021-10-08 DIAGNOSIS — G4700 Insomnia, unspecified: Secondary | ICD-10-CM | POA: Diagnosis not present

## 2021-10-08 DIAGNOSIS — G893 Neoplasm related pain (acute) (chronic): Secondary | ICD-10-CM | POA: Diagnosis not present

## 2021-10-08 MED ORDER — SODIUM CHLORIDE 0.9% FLUSH
10.0000 mL | Freq: Once | INTRAVENOUS | Status: AC
  Administered 2021-10-08: 10 mL

## 2021-10-08 MED ORDER — DIPHENHYDRAMINE HCL 25 MG PO CAPS
25.0000 mg | ORAL_CAPSULE | Freq: Once | ORAL | Status: AC
  Administered 2021-10-08: 25 mg via ORAL
  Filled 2021-10-08: qty 1

## 2021-10-08 MED ORDER — HEPARIN SOD (PORK) LOCK FLUSH 100 UNIT/ML IV SOLN
500.0000 [IU] | Freq: Once | INTRAVENOUS | Status: AC
  Administered 2021-10-08: 500 [IU]

## 2021-10-08 MED ORDER — ACETAMINOPHEN 325 MG PO TABS
650.0000 mg | ORAL_TABLET | Freq: Once | ORAL | Status: AC
  Administered 2021-10-08: 650 mg via ORAL
  Filled 2021-10-08: qty 2

## 2021-10-08 MED ORDER — SODIUM CHLORIDE 0.9% IV SOLUTION
250.0000 mL | Freq: Once | INTRAVENOUS | Status: AC
  Administered 2021-10-08: 250 mL via INTRAVENOUS

## 2021-10-08 NOTE — Progress Notes (Signed)
Pt tolerated prbc's well. Pt still has some concerns about low b/p and taking prescribed b/p meds. Advised pt to track b/p and f/u with PCP. Pt verbalized understanding ?

## 2021-10-09 DIAGNOSIS — I959 Hypotension, unspecified: Secondary | ICD-10-CM | POA: Insufficient documentation

## 2021-10-09 DIAGNOSIS — G893 Neoplasm related pain (acute) (chronic): Secondary | ICD-10-CM | POA: Insufficient documentation

## 2021-10-09 DIAGNOSIS — Z515 Encounter for palliative care: Secondary | ICD-10-CM | POA: Insufficient documentation

## 2021-10-10 ENCOUNTER — Telehealth (HOSPITAL_BASED_OUTPATIENT_CLINIC_OR_DEPARTMENT_OTHER): Payer: Medicare HMO | Admitting: Nurse Practitioner

## 2021-10-10 DIAGNOSIS — R530 Neoplastic (malignant) related fatigue: Secondary | ICD-10-CM

## 2021-10-10 DIAGNOSIS — Z515 Encounter for palliative care: Secondary | ICD-10-CM

## 2021-10-10 DIAGNOSIS — Z7189 Other specified counseling: Secondary | ICD-10-CM

## 2021-10-10 DIAGNOSIS — G893 Neoplasm related pain (acute) (chronic): Secondary | ICD-10-CM | POA: Diagnosis not present

## 2021-10-10 DIAGNOSIS — C799 Secondary malignant neoplasm of unspecified site: Secondary | ICD-10-CM | POA: Diagnosis not present

## 2021-10-10 DIAGNOSIS — C161 Malignant neoplasm of fundus of stomach: Secondary | ICD-10-CM

## 2021-10-10 DIAGNOSIS — R531 Weakness: Secondary | ICD-10-CM

## 2021-10-10 LAB — TYPE AND SCREEN
ABO/RH(D): AB POS
Antibody Screen: NEGATIVE
Unit division: 0
Unit division: 0

## 2021-10-10 LAB — BPAM RBC
Blood Product Expiration Date: 202303212359
Blood Product Expiration Date: 202303212359
ISSUE DATE / TIME: 202303040858
ISSUE DATE / TIME: 202303040858
Unit Type and Rh: 6200
Unit Type and Rh: 6200

## 2021-10-10 NOTE — Telephone Encounter (Signed)
Spoke at length with patient and his wife, Billy [name] regarding goals of care and medication regimen. Billy [name] was seen in the office on Friday and medication adjustments were made due to uncontrolled pain. He is currently taking MS Contin every 8 hours and reports significant improvement in his pain. He has not required Oxycodone over the past 24 hours but does endorse awareness that this can be taken for breakthrough pain.  ? ?Billy [name] shares patient was quite fatigue on yesterday after having blood transfusion on Sunday and being in the car as she ran errands. His energy level continues to decline more than usual. He would initially feel somewhat energized in the morning and become increasingly fatigue as the day goes on however recently his fatigue and weakness has been ongoing. Education provided on disease trajectory and expectations. They verbalized understanding. Offered to engage in further discussions to include daughter or other family members however, wife declines at this time.  ? ?Education provided on the goals and philosophy of palliative and hospice's services. Patient is currently enrolled in outpatient hospice ([facility]) and given disease progression recommendations have been made to transition to a more comfort focused approached with hospice. Wife understands all care will remain with [facility] and main difference is the level of care. They are being followed by Billy Glad, NP with [facility]. I spoke with her on Friday and provided updates. I will plan to reach out to her today also and make aware family would like a visit this week if possible. During last conversation Billy Glad, NP planned to give patient a call this week and schedule follow-up.  ? ?We discussed possible home equipment need however wife could not identify any at this time. I expect as patient continues to become weaker a hospital bed would be most beneficial.  ? ?Billy [name] and patient are aware our team including Dr. Burr Medico are available if  needed. He does not require any future scheduled appointments at this time.  ? ?Plan ?-Continue MS Contin every 8 hours  ?-Oxycodone 5-10 mg every 4 hours as needed for breakthrough pain ?-Wife expresses wishes for home follow-up with [facility] for continued guidance and support in the home. I have sent Billy Glad, NP a message for follow-up and referral for hospice sent.  ?-No follow-up appointments needed. Wife is aware our Palliative/Oncology team is available as needed for ongoing support.  ? ?Time Total: 40 min.  ? ?Visit consisted of counseling and education dealing with the complex and emotionally intense issues of symptom management and palliative care in the setting of serious and potentially life-threatening illness.Greater than 50%  of this time was spent counseling and coordinating care related to the above assessment and plan. ? ?Billy [name], AGPCNP-BC  ?[place] ? ? ?

## 2021-10-11 ENCOUNTER — Encounter: Payer: Self-pay | Admitting: Family Medicine

## 2021-10-11 ENCOUNTER — Other Ambulatory Visit: Payer: Self-pay

## 2021-10-11 ENCOUNTER — Other Ambulatory Visit: Payer: Medicare HMO | Admitting: Family Medicine

## 2021-10-11 DIAGNOSIS — Z515 Encounter for palliative care: Secondary | ICD-10-CM

## 2021-10-11 DIAGNOSIS — I2609 Other pulmonary embolism with acute cor pulmonale: Secondary | ICD-10-CM

## 2021-10-11 DIAGNOSIS — D62 Acute posthemorrhagic anemia: Secondary | ICD-10-CM

## 2021-10-11 DIAGNOSIS — C161 Malignant neoplasm of fundus of stomach: Secondary | ICD-10-CM

## 2021-10-11 DIAGNOSIS — I82431 Acute embolism and thrombosis of right popliteal vein: Secondary | ICD-10-CM

## 2021-10-11 DIAGNOSIS — I959 Hypotension, unspecified: Secondary | ICD-10-CM

## 2021-10-11 DIAGNOSIS — G893 Neoplasm related pain (acute) (chronic): Secondary | ICD-10-CM | POA: Diagnosis not present

## 2021-10-11 NOTE — Progress Notes (Signed)
Patient ID: Billy [name], male   DOB: 1940-11-24, 80 y.o.   MRN: [id number] ? ?Billy [name] is an 81 year old male with diagnosis of stage IV Gastric Cancer with multiple metastasis to liver and lymph nodes.  Pt began chemotherapy with Oncologist, Dr Burr Medico and had sudden onset PE/DVT where tPA was tried and pt developed sudden iliopsoas hematoma which affected the use of his right leg.  He has been working with PT to regain use of that leg without significant progress.  He was subsequently not a candidate for further treatment with noted declining health including increasing fatigue, weakness, hypotension and has developed GI bleeding such that his HGB dropped to a nadir of 6.9 on 10/07/21.  As such Oncologist transfused 2 units PRBCs to lessen severity of his symptoms and referred to [facility].  This provider had been following under [place] and was noted by [name]. Billy [name] of said referral.  She spoke with pt and wife yesterday as did this provider to set up visit today to discuss Hospice referral.  Pt and wife stated to both [name] and myself that they were confused about services needed and why they needed transition to [name] and if they were ready at this point.  Per this provider's review of data, the options for treatment including Palliation are non-existent at present and pt would be better served by Hospice at present time.  Spoke with [place] NP and confirmed pt has been having black, tarry stools, feeling fatigued and dizzy with GI blood loss based on presentation and presence of cancer with significant drop in HGB.  Inpatient Palliative NP indicated that GI bleeding is not unexpected with pt's cancer and Oncologist has indicated that he will likely continue to bleed with inability to address due to damage from cancer and high vascularity of cancer.  At his visit last Friday 10/07/21 the Oncologist stopped his  anticoagulation as well as his Lisinopril due to hypotension and bleeding.  Prognosis estimated to be 3 months or less depending on blood loss which, if heavy, could significantly shorten time frame. ?Met with pt and wife initially and informed of highly vascular nature of cancer with continued progression and that there were no further treatment options.  Advised that although he did get transfused with 2 units that this was not sustainable for treatment since the cancer could not be removed and was likely to continue bleeding which would eventually be lifethreatening.  Advised that even with transfusion he may start to bleed faster than can be replaced which again would be lifethreatening but may also necessitate that he remain inpatient to manage his needs.  Pt states he wants to be at home with family.  He has completed DNR and MOST form previously.  Advised that pt's rapidly changing needs would be better met under Hospice level of care.  Advised of multidisciplinary team and pt/family support.  Advised he and his wife that they should strongly consider transition to [name].  Encouraged them to discuss openly with their adult children [name] and [name].  Pt and wife asked if this provider would meet with them/their children and explain all that had just been explained.  Met with both son and daughter, their respective spouses.  Same information was relayed to them as well as the patient's wish to remain in his home surrounded by family.  He expressed much improved comfort from his back pain since Inpatient NP had increased  his MS Contin to TID. Wife had expressed concern about pt's comfort but also about his being able to be alert and present when with the family.  Advised that the goal would be to use the least amount necessary for good pain relief to avoid oversedation but as disease continued to take it's toll on his body that the ability to meet his comfort may require a trade-off in level of  consciousness.  Advised that if he began to bleed heavily he may feel lightheaded, SOB and may have CP.  Also advised he would have significant fatigue.  Pt states that even after transfusion of 2 units he did not feel significantly better.  Advised that he may require O2 for comfort of breathing and to decrease pain associated with hypoxia.  Encouraged pt and family to express themselves to each other and savor the time while he is feeling a little better and can interact but to not be surprised if he began being more fatigued, SOB and sleeping more.  Advised pt to do any activity he felt up to doing, eat whatever he liked and use pain meds/let Hospice know if pain control inadequate.  [name] with Hospice Referral team called while this provider present and after discussion with pt and wife, pt is in agreement with transition to Hospice services.  He stated multiple times "I just didn't know we were quite there yet but now I understand."  Hospice admission visit set for 10/12/21 at 1 pm per pt and wife request.  Wife appeared to withdraw after discussion that there was no treatment left other than comfort.  She held patient's hand and was tearful.  She is currently facing her own questions about a health issue and had testing done today.  Advised both that PT would stop as his RLE was not expected to significantly improve given his current condition.  Advised he was at risk for both clot and bleeding due to the cancer and that major hemorrhage was at least mercifully quick compared to other means.  Wife given support to call for assistance if needed or if questions in coping with pt's illness for him or herself.  Advised of Chaplain availability and bereavement counseling for the family. All expressed understanding and appreciation for the information provided.  Have encouraged the family to focus less on his vital signs and more on how he feels doing activities as a guide to whether additional pain meds or Oxygen  support needed.  Advised that as his counts drop he will likely sleep more and spend more time in bed. ? ?Advance Care Planning/Goals of Care: Goals include to maximize quality of life and symptom management. Patient gave his permission to discuss. ?Our advance care planning conversation included a discussion about:    ?Exploration of personal, cultural or spiritual beliefs that might influence medical decisions- had significant adverse events following chemotherapy, has had loss of function of RLE as result, wants to be able hom with family and kept comfortable. ?Exploration of goals of care in the event of a sudden injury or illness-Does not want to be maintained on life support  ?Identification of a healthcare agent-wife ?Reviewed MOST form, left with patient  ?  ?CODE STATUS: ?DNR/DNI ?Comfort Care ?? IV/antibiotics/feeding tube ?Time spent 95 minutes total (2:45-3:30 pm and 5:50-6:30 pm) in [place] with greater than 50% spent in coordination of care. ? ?Damaris Hippo FNP-C ?

## 2021-10-12 ENCOUNTER — Encounter: Payer: Self-pay | Admitting: Licensed Clinical Social Worker

## 2021-10-12 NOTE — Progress Notes (Signed)
[place] CSW Progress Note ? ?Clinical Social Worker contacted caregiver by phone to check on patient and caregiver.  Pt's spouse reports it has been an overwhelming couple of days, but she believes the level of support pt will now have with hospice services will be better for him.  Pt's spouse is still processing pt's prognosis as he has decompensated much quicker than she had anticipated, but does have supportive family and is trying to focus on one day at a time.  Writer to continue to provide telephone support periodically as appropriate.   ? ? ? ?Henriette Combs , LCSW ?

## 2021-10-14 ENCOUNTER — Telehealth: Payer: Self-pay

## 2021-10-14 ENCOUNTER — Other Ambulatory Visit: Payer: Self-pay | Admitting: Hematology

## 2021-10-14 NOTE — Telephone Encounter (Signed)
FYI: ?Billy [name] is calling in to report a missed PT home visit. There was no answer at the door. ? ?Billy [name] contact number- [phone]. ?

## 2021-10-20 ENCOUNTER — Ambulatory Visit: Payer: Medicare HMO

## 2021-10-20 ENCOUNTER — Ambulatory Visit: Payer: Medicare HMO | Admitting: Hematology

## 2021-10-20 ENCOUNTER — Other Ambulatory Visit: Payer: Medicare HMO

## 2021-10-23 ENCOUNTER — Other Ambulatory Visit: Payer: Self-pay | Admitting: Hematology

## 2021-10-31 ENCOUNTER — Other Ambulatory Visit: Payer: Self-pay | Admitting: Nurse Practitioner

## 2021-11-01 ENCOUNTER — Encounter: Payer: Self-pay | Admitting: Hematology

## 2021-11-01 NOTE — Progress Notes (Signed)
Clinical Social Worker already documented note in another encounter. ? ? ? ?Princess Karnes R Luka Reisch Clinical Social Work, LCSW ?

## 2021-11-04 ENCOUNTER — Ambulatory Visit: Payer: Medicare HMO | Admitting: Internal Medicine

## 2021-11-05 DEATH — deceased

## 2021-11-14 ENCOUNTER — Other Ambulatory Visit: Payer: Self-pay | Admitting: Internal Medicine
